# Patient Record
Sex: Female | Born: 1935
Health system: Southern US, Community
[De-identification: ages and names within clinical notes are randomized; demographics above are authoritative.]

## PROBLEM LIST (undated history)

## (undated) ENCOUNTER — Emergency Department (HOSPITAL_COMMUNITY): Payer: Medicare Other | Source: Home / Self Care

## (undated) DIAGNOSIS — F329 Major depressive disorder, single episode, unspecified: Secondary | ICD-10-CM

## (undated) DIAGNOSIS — N183 Chronic kidney disease, stage 3 unspecified: Secondary | ICD-10-CM

## (undated) DIAGNOSIS — E119 Type 2 diabetes mellitus without complications: Secondary | ICD-10-CM

## (undated) DIAGNOSIS — K219 Gastro-esophageal reflux disease without esophagitis: Secondary | ICD-10-CM

## (undated) DIAGNOSIS — F32A Depression, unspecified: Secondary | ICD-10-CM

## (undated) DIAGNOSIS — M1990 Unspecified osteoarthritis, unspecified site: Secondary | ICD-10-CM

## (undated) DIAGNOSIS — I639 Cerebral infarction, unspecified: Secondary | ICD-10-CM

## (undated) DIAGNOSIS — I509 Heart failure, unspecified: Secondary | ICD-10-CM

## (undated) DIAGNOSIS — C4359 Malignant melanoma of other part of trunk: Secondary | ICD-10-CM

## (undated) DIAGNOSIS — I4891 Unspecified atrial fibrillation: Secondary | ICD-10-CM

## (undated) DIAGNOSIS — I10 Essential (primary) hypertension: Secondary | ICD-10-CM

## (undated) DIAGNOSIS — E7800 Pure hypercholesterolemia, unspecified: Secondary | ICD-10-CM

## (undated) DIAGNOSIS — I219 Acute myocardial infarction, unspecified: Secondary | ICD-10-CM

## (undated) HISTORY — PX: KNEE SURGERY: SHX244

## (undated) HISTORY — PX: MELANOMA EXCISION: SHX5266

## (undated) HISTORY — PX: TUBAL LIGATION: SHX77

## (undated) HISTORY — PX: LAPAROSCOPIC CHOLECYSTECTOMY: SUR755

## (undated) HISTORY — PX: APPENDECTOMY: SHX54

---

## 2008-08-05 DIAGNOSIS — I10 Essential (primary) hypertension: Secondary | ICD-10-CM | POA: Diagnosis present

## 2015-04-15 DIAGNOSIS — G459 Transient cerebral ischemic attack, unspecified: Secondary | ICD-10-CM | POA: Insufficient documentation

## 2015-11-13 DIAGNOSIS — Z9181 History of falling: Secondary | ICD-10-CM | POA: Insufficient documentation

## 2015-11-13 DIAGNOSIS — K219 Gastro-esophageal reflux disease without esophagitis: Secondary | ICD-10-CM | POA: Insufficient documentation

## 2016-02-19 DIAGNOSIS — E1129 Type 2 diabetes mellitus with other diabetic kidney complication: Secondary | ICD-10-CM | POA: Insufficient documentation

## 2016-02-19 DIAGNOSIS — N2581 Secondary hyperparathyroidism of renal origin: Secondary | ICD-10-CM | POA: Diagnosis present

## 2016-07-13 DIAGNOSIS — E113493 Type 2 diabetes mellitus with severe nonproliferative diabetic retinopathy without macular edema, bilateral: Secondary | ICD-10-CM | POA: Insufficient documentation

## 2016-07-28 DIAGNOSIS — R011 Cardiac murmur, unspecified: Secondary | ICD-10-CM | POA: Insufficient documentation

## 2016-10-25 DIAGNOSIS — R053 Chronic cough: Secondary | ICD-10-CM | POA: Insufficient documentation

## 2017-02-28 ENCOUNTER — Encounter (HOSPITAL_COMMUNITY): Payer: Self-pay

## 2017-02-28 ENCOUNTER — Inpatient Hospital Stay (HOSPITAL_COMMUNITY)
Admission: EM | Admit: 2017-02-28 | Discharge: 2017-03-07 | DRG: 286 | Disposition: A | Discharge status: Home / Self Care | Payer: Medicare Other | Attending: Family Medicine | Admitting: Family Medicine

## 2017-02-28 ENCOUNTER — Emergency Department (HOSPITAL_COMMUNITY): Payer: Medicare Other

## 2017-02-28 DIAGNOSIS — J45909 Unspecified asthma, uncomplicated: Secondary | ICD-10-CM | POA: Diagnosis present

## 2017-02-28 DIAGNOSIS — N179 Acute kidney failure, unspecified: Secondary | ICD-10-CM

## 2017-02-28 DIAGNOSIS — D539 Nutritional anemia, unspecified: Secondary | ICD-10-CM

## 2017-02-28 DIAGNOSIS — I252 Old myocardial infarction: Secondary | ICD-10-CM

## 2017-02-28 DIAGNOSIS — E1122 Type 2 diabetes mellitus with diabetic chronic kidney disease: Secondary | ICD-10-CM | POA: Diagnosis present

## 2017-02-28 DIAGNOSIS — R0602 Shortness of breath: Secondary | ICD-10-CM | POA: Diagnosis not present

## 2017-02-28 DIAGNOSIS — I509 Heart failure, unspecified: Secondary | ICD-10-CM

## 2017-02-28 DIAGNOSIS — E785 Hyperlipidemia, unspecified: Secondary | ICD-10-CM | POA: Diagnosis present

## 2017-02-28 DIAGNOSIS — I5021 Acute systolic (congestive) heart failure: Secondary | ICD-10-CM

## 2017-02-28 DIAGNOSIS — Z8673 Personal history of transient ischemic attack (TIA), and cerebral infarction without residual deficits: Secondary | ICD-10-CM

## 2017-02-28 DIAGNOSIS — I130 Hypertensive heart and chronic kidney disease with heart failure and stage 1 through stage 4 chronic kidney disease, or unspecified chronic kidney disease: Principal | ICD-10-CM | POA: Diagnosis present

## 2017-02-28 DIAGNOSIS — I10 Essential (primary) hypertension: Secondary | ICD-10-CM

## 2017-02-28 DIAGNOSIS — Z820 Family history of epilepsy and other diseases of the nervous system: Secondary | ICD-10-CM

## 2017-02-28 DIAGNOSIS — I4581 Long QT syndrome: Secondary | ICD-10-CM | POA: Diagnosis present

## 2017-02-28 DIAGNOSIS — D509 Iron deficiency anemia, unspecified: Secondary | ICD-10-CM | POA: Diagnosis present

## 2017-02-28 DIAGNOSIS — D649 Anemia, unspecified: Secondary | ICD-10-CM

## 2017-02-28 DIAGNOSIS — Z809 Family history of malignant neoplasm, unspecified: Secondary | ICD-10-CM

## 2017-02-28 DIAGNOSIS — E7800 Pure hypercholesterolemia, unspecified: Secondary | ICD-10-CM

## 2017-02-28 DIAGNOSIS — N183 Chronic kidney disease, stage 3 unspecified: Secondary | ICD-10-CM

## 2017-02-28 DIAGNOSIS — W19XXXA Unspecified fall, initial encounter: Secondary | ICD-10-CM

## 2017-02-28 DIAGNOSIS — Z823 Family history of stroke: Secondary | ICD-10-CM

## 2017-02-28 DIAGNOSIS — R0902 Hypoxemia: Secondary | ICD-10-CM | POA: Diagnosis present

## 2017-02-28 DIAGNOSIS — Z833 Family history of diabetes mellitus: Secondary | ICD-10-CM

## 2017-02-28 DIAGNOSIS — F419 Anxiety disorder, unspecified: Secondary | ICD-10-CM | POA: Diagnosis present

## 2017-02-28 DIAGNOSIS — I480 Paroxysmal atrial fibrillation: Secondary | ICD-10-CM

## 2017-02-28 DIAGNOSIS — E119 Type 2 diabetes mellitus without complications: Secondary | ICD-10-CM

## 2017-02-28 DIAGNOSIS — I5023 Acute on chronic systolic (congestive) heart failure: Secondary | ICD-10-CM | POA: Diagnosis present

## 2017-02-28 DIAGNOSIS — R001 Bradycardia, unspecified: Secondary | ICD-10-CM | POA: Diagnosis present

## 2017-02-28 DIAGNOSIS — Z87891 Personal history of nicotine dependence: Secondary | ICD-10-CM

## 2017-02-28 DIAGNOSIS — Z7901 Long term (current) use of anticoagulants: Secondary | ICD-10-CM

## 2017-02-28 DIAGNOSIS — Z794 Long term (current) use of insulin: Secondary | ICD-10-CM

## 2017-02-28 DIAGNOSIS — I440 Atrioventricular block, first degree: Secondary | ICD-10-CM | POA: Diagnosis present

## 2017-02-28 HISTORY — DX: Chronic kidney disease, stage 3 unspecified: N18.30

## 2017-02-28 HISTORY — DX: Chronic kidney disease, stage 3 (moderate): N18.3

## 2017-02-28 HISTORY — DX: Malignant melanoma of other part of trunk: C43.59

## 2017-02-28 HISTORY — DX: Acute myocardial infarction, unspecified: I21.9

## 2017-02-28 HISTORY — DX: Gastro-esophageal reflux disease without esophagitis: K21.9

## 2017-02-28 HISTORY — DX: Heart failure, unspecified: I50.9

## 2017-02-28 HISTORY — DX: Major depressive disorder, single episode, unspecified: F32.9

## 2017-02-28 HISTORY — DX: Cerebral infarction, unspecified: I63.9

## 2017-02-28 HISTORY — DX: Unspecified atrial fibrillation: I48.91

## 2017-02-28 HISTORY — DX: Depression, unspecified: F32.A

## 2017-02-28 HISTORY — DX: Pure hypercholesterolemia, unspecified: E78.00

## 2017-02-28 HISTORY — DX: Essential (primary) hypertension: I10

## 2017-02-28 HISTORY — DX: Unspecified osteoarthritis, unspecified site: M19.90

## 2017-02-28 HISTORY — DX: Type 2 diabetes mellitus without complications: E11.9

## 2017-02-28 LAB — FOLATE: FOLATE: 16 ng/mL (ref >5.9)

## 2017-02-28 LAB — BASIC METABOLIC PANEL
ANION GAP: 10 (ref 5–15)
BUN: 22 mg/dL — AB (ref 6–20)
CALCIUM: 9.2 mg/dL (ref 8.9–10.3)
CO2: 22 mmol/L (ref 22–32)
Chloride: 108 mmol/L (ref 101–111)
Creatinine, Ser: 1.57 mg/dL — ABNORMAL HIGH (ref 0.44–1.00)
GFR calc Af Amer: 34 mL/min — ABNORMAL LOW (ref >60)
GFR calc non Af Amer: 29 mL/min — ABNORMAL LOW (ref >60)
GLUCOSE: 133 mg/dL — AB (ref 65–99)
Potassium: 3.7 mmol/L (ref 3.5–5.1)
Sodium: 140 mmol/L (ref 135–145)

## 2017-02-28 LAB — CBC
HCT: 31.8 % — ABNORMAL LOW (ref 36.0–46.0)
HEMOGLOBIN: 9.8 g/dL — AB (ref 12.0–15.0)
MCH: 26.6 pg (ref 26.0–34.0)
MCHC: 30.8 g/dL (ref 30.0–36.0)
MCV: 86.4 fL (ref 78.0–100.0)
Platelets: 211 10*3/uL (ref 150–400)
RBC: 3.68 MIL/uL — ABNORMAL LOW (ref 3.87–5.11)
RDW: 15.7 % — AB (ref 11.5–15.5)
WBC: 7.9 10*3/uL (ref 4.0–10.5)

## 2017-02-28 LAB — RETICULOCYTES
RBC.: 3.73 MIL/uL — AB (ref 3.87–5.11)
Retic Count, Absolute: 63.4 10*3/uL (ref 19.0–186.0)
Retic Ct Pct: 1.7 % (ref 0.4–3.1)

## 2017-02-28 LAB — TROPONIN I: Troponin I: 0.05 ng/mL (ref <0.03)

## 2017-02-28 LAB — GLUCOSE, CAPILLARY: Glucose-Capillary: 154 mg/dL — ABNORMAL HIGH (ref 65–99)

## 2017-02-28 LAB — URINALYSIS, ROUTINE W REFLEX MICROSCOPIC
Bilirubin Urine: NEGATIVE
GLUCOSE, UA: NEGATIVE mg/dL
Hgb urine dipstick: NEGATIVE
Ketones, ur: NEGATIVE mg/dL
LEUKOCYTES UA: NEGATIVE
NITRITE: NEGATIVE
PH: 5 (ref 5.0–8.0)
Protein, ur: NEGATIVE mg/dL
SPECIFIC GRAVITY, URINE: 1.005 (ref 1.005–1.030)

## 2017-02-28 LAB — IRON AND TIBC
Iron: 19 ug/dL — ABNORMAL LOW (ref 28–170)
SATURATION RATIOS: 5 % — AB (ref 10.4–31.8)
TIBC: 381 ug/dL (ref 250–450)
UIBC: 362 ug/dL

## 2017-02-28 LAB — TSH: TSH: 3.309 u[IU]/mL (ref 0.350–4.500)

## 2017-02-28 LAB — CBG MONITORING, ED: Glucose-Capillary: 127 mg/dL — ABNORMAL HIGH (ref 65–99)

## 2017-02-28 LAB — I-STAT TROPONIN, ED: Troponin i, poc: 0.05 ng/mL (ref 0.00–0.08)

## 2017-02-28 LAB — VITAMIN B12: Vitamin B-12: 528 pg/mL (ref 180–914)

## 2017-02-28 LAB — FERRITIN: Ferritin: 34 ng/mL (ref 11–307)

## 2017-02-28 LAB — BRAIN NATRIURETIC PEPTIDE: B Natriuretic Peptide: 1488.1 pg/mL — ABNORMAL HIGH (ref 0.0–100.0)

## 2017-02-28 MED ORDER — DILTIAZEM HCL ER COATED BEADS 180 MG PO CP24
180.0000 mg | ORAL_CAPSULE | Freq: Every day | ORAL | Status: DC
  Administered 2017-02-28 – 2017-03-02 (×3): 180 mg via ORAL
  Filled 2017-02-28 (×3): qty 1

## 2017-02-28 MED ORDER — ONDANSETRON HCL 4 MG/2ML IJ SOLN: 4.0000 mg | Freq: Four times a day (QID) | INTRAMUSCULAR | Status: DC | PRN

## 2017-02-28 MED ORDER — AMIODARONE HCL 100 MG PO TABS
100.0000 mg | ORAL_TABLET | Freq: Every day | ORAL | Status: DC
  Administered 2017-02-28 – 2017-03-07 (×8): 100 mg via ORAL
  Filled 2017-02-28 (×8): qty 1

## 2017-02-28 MED ORDER — ISOSORBIDE MONONITRATE ER 30 MG PO TB24
30.0000 mg | ORAL_TABLET | Freq: Every day | ORAL | Status: DC
  Administered 2017-02-28 – 2017-03-02 (×3): 30 mg via ORAL
  Filled 2017-02-28 (×3): qty 1

## 2017-02-28 MED ORDER — SODIUM CHLORIDE 0.9% FLUSH
3.0000 mL | Freq: Two times a day (BID) | INTRAVENOUS | Status: DC
  Administered 2017-02-28 – 2017-03-07 (×11): 3 mL via INTRAVENOUS

## 2017-02-28 MED ORDER — VENLAFAXINE HCL 75 MG PO TABS
75.0000 mg | ORAL_TABLET | Freq: Every day | ORAL | Status: DC
  Filled 2017-02-28: qty 1

## 2017-02-28 MED ORDER — ALBUTEROL SULFATE (2.5 MG/3ML) 0.083% IN NEBU: 3.0000 mL | INHALATION_SOLUTION | Freq: Four times a day (QID) | RESPIRATORY_TRACT | Status: DC | PRN

## 2017-02-28 MED ORDER — ACETAMINOPHEN 325 MG PO TABS
650.0000 mg | ORAL_TABLET | ORAL | Status: DC | PRN
  Administered 2017-03-04 – 2017-03-05 (×2): 650 mg via ORAL
  Filled 2017-02-28 (×2): qty 2

## 2017-02-28 MED ORDER — FUROSEMIDE 10 MG/ML IJ SOLN
40.0000 mg | Freq: Two times a day (BID) | INTRAMUSCULAR | Status: DC
  Administered 2017-03-01 – 2017-03-02 (×3): 40 mg via INTRAVENOUS
  Filled 2017-02-28 (×3): qty 4

## 2017-02-28 MED ORDER — INSULIN ASPART 100 UNIT/ML ~~LOC~~ SOLN
0.0000 [IU] | Freq: Three times a day (TID) | SUBCUTANEOUS | Status: DC
  Administered 2017-03-01 – 2017-03-02 (×3): 2 [IU] via SUBCUTANEOUS
  Administered 2017-03-02 – 2017-03-03 (×2): 5 [IU] via SUBCUTANEOUS
  Administered 2017-03-03 (×2): 2 [IU] via SUBCUTANEOUS
  Administered 2017-03-04 (×3): 3 [IU] via SUBCUTANEOUS
  Administered 2017-03-05: 5 [IU] via SUBCUTANEOUS
  Administered 2017-03-05: 2 [IU] via SUBCUTANEOUS
  Administered 2017-03-05 – 2017-03-06 (×2): 3 [IU] via SUBCUTANEOUS
  Administered 2017-03-06: 5 [IU] via SUBCUTANEOUS
  Administered 2017-03-07 (×2): 3 [IU] via SUBCUTANEOUS

## 2017-02-28 MED ORDER — FUROSEMIDE 10 MG/ML IJ SOLN
40.0000 mg | Freq: Once | INTRAMUSCULAR | Status: AC
  Administered 2017-02-28: 40 mg via INTRAVENOUS
  Filled 2017-02-28: qty 4

## 2017-02-28 MED ORDER — PRAVASTATIN SODIUM 40 MG PO TABS
40.0000 mg | ORAL_TABLET | Freq: Every day | ORAL | Status: DC
  Administered 2017-02-28 – 2017-03-06 (×7): 40 mg via ORAL
  Filled 2017-02-28 (×7): qty 1

## 2017-02-28 MED ORDER — SODIUM CHLORIDE 0.9 % IV SOLN: 250.0000 mL | INTRAVENOUS | Status: DC | PRN

## 2017-02-28 MED ORDER — RIVAROXABAN 15 MG PO TABS
15.0000 mg | ORAL_TABLET | Freq: Every day | ORAL | Status: DC
  Administered 2017-02-28 – 2017-03-01 (×2): 15 mg via ORAL
  Filled 2017-02-28 (×2): qty 1

## 2017-02-28 MED ORDER — INSULIN GLARGINE 100 UNIT/ML ~~LOC~~ SOLN
30.0000 [IU] | Freq: Every day | SUBCUTANEOUS | Status: DC
  Administered 2017-02-28 – 2017-03-06 (×7): 30 [IU] via SUBCUTANEOUS
  Filled 2017-02-28 (×8): qty 0.3

## 2017-02-28 MED ORDER — LOSARTAN POTASSIUM 50 MG PO TABS
100.0000 mg | ORAL_TABLET | Freq: Every day | ORAL | Status: DC
  Administered 2017-02-28 – 2017-03-02 (×3): 100 mg via ORAL
  Filled 2017-02-28 (×3): qty 2

## 2017-02-28 MED ORDER — SODIUM CHLORIDE 0.9% FLUSH: 3.0000 mL | INTRAVENOUS | Status: DC | PRN

## 2017-02-28 NOTE — ED Triage Notes (Addendum)
Per Pt, Pt is coming from UC with complaints of SOB and Bilateral leg swelling x 1 week. Pt has a hx of asthma and was trying to use inhalers with no relief. Pt reports a productive cough with some white sputum. Denies any chest pain except with exertion. Pt was diagnosed with UTI at the UC.

## 2017-02-28 NOTE — ED Provider Notes (Signed)
[facility] DEPT Provider Note   CSN: [id number] Arrival date & time: 02/28/17  1610     History   Chief Complaint Chief Complaint  Patient presents with  . Shortness of Breath    HPI Shannon Lucas is a 83 y.o. female history of A. fib on some route toe, hypertension, who presenting shortness of breath. Patient states that she has been coughing all winter long and was thought to have some bronchitis. She is using albuterol as needed and has not helped much. Over the last week, her cough got worse and she feels very short of breath. She noticed that her legs are swollen bilaterally and she gets shortness of breath with minimal exertion. Patient also noticed that she has to be sleeping on more pillows at night. Denies any fevers or chills. She has been followed at [facility] and has no doctors in our system. She denies any history of heart failure in the past.  The history is provided by the patient.    Past Medical History:  Diagnosis Date  . A-fib ([place])   . Arthritis   . Cancer (HCC)    Skin  . CHF (congestive heart failure) ([place])   . Diabetes mellitus without complication ([place])   . Hypertension   . Renal disorder    Stage 4 Kidney Failure  . Stroke [facility])     There are no active problems to display for this patient.   Past Surgical History:  Procedure Laterality Date  . KNEE SURGERY      OB History    No data available       Home Medications    Prior to Admission medications   Not on File    Family History No family history on file.  Social History Social History  Substance Use Topics  . Smoking status: Former Research scientist (life sciences)  . Smokeless tobacco: Never Used  . Alcohol use No     Allergies   Patient has no known allergies.   Review of Systems Review of Systems  Respiratory: Positive for shortness of breath.   All other systems reviewed and are negative.    Physical Exam Updated Vital Signs BP (!) 141/76   Pulse 96   Temp 97.4 F (36.3 C)  (Oral)   Resp 20   Ht 5' 5.5" (1.664 m)   SpO2 100%   Physical Exam  Constitutional: She is oriented to person, place, and time.  Uncomfortable   HENT:  Head: Normocephalic.  Mouth/Throat: Oropharynx is clear and moist.  Eyes: Conjunctivae and EOM are normal. Pupils are equal, round, and reactive to light.  Neck: Normal range of motion. Neck supple.  Cardiovascular: Normal rate, regular rhythm and normal heart sounds.   Pulmonary/Chest: Effort normal.  Crackles bilateral bases   Abdominal: Soft. Bowel sounds are normal. She exhibits no distension. There is no tenderness.  Musculoskeletal: Normal range of motion.  1+ edema bilateral legs   Neurological: She is alert and oriented to person, place, and time.  Skin: Skin is warm.  Psychiatric: She has a normal mood and affect.  Nursing note and vitals reviewed.    ED Treatments / Results  Labs (all labs ordered are listed, but only abnormal results are displayed) Labs Reviewed  BASIC METABOLIC PANEL - Abnormal; Notable for the following:       Result Value   Glucose, Bld 133 (*)    BUN 22 (*)    Creatinine, Ser 1.57 (*)    GFR calc non Af  Amer 29 (*)    GFR calc Af Amer 34 (*)    All other components within normal limits  CBC - Abnormal; Notable for the following:    RBC 3.68 (*)    Hemoglobin 9.8 (*)    HCT 31.8 (*)    RDW 15.7 (*)    All other components within normal limits  BRAIN NATRIURETIC PEPTIDE - Abnormal; Notable for the following:    B Natriuretic Peptide 1,488.1 (*)    All other components within normal limits  URINALYSIS, ROUTINE W REFLEX MICROSCOPIC  I-STAT TROPOININ, ED    EKG  EKG Interpretation  Date/Time:  Tuesday February 28 2017 16:21:09 EDT Ventricular Rate:  53 PR Interval:  220 QRS Duration: 92 QT Interval:  532 QTC Calculation: 499 R Axis:   36 Text Interpretation:  Sinus bradycardia with 1st degree A-V block Abnormal QRS-T angle, consider primary T wave abnormality Prolonged QT Abnormal  ECG No previous ECGs available Confirmed by YAO  MD, DAVID ([id number]) on 02/28/2017 4:56:29 PM       Radiology Dg Chest 2 View  Result Date: 02/28/2017 CLINICAL DATA:  Short of breath EXAM: CHEST  2 VIEW COMPARISON:  None. FINDINGS: Cardiac enlargement. Pulmonary artery enlargement consistent with pulmonary artery hypertension. Negative for heart failure, edema, or effusion. Mild atelectasis or scarring in the bases. Negative for pneumonia. Apical pleural scarring bilaterally. IMPRESSION: Cardiac enlargement with pulmonary artery enlargement suggesting pulmonary artery hypertension. No acute abnormality. Electronically Signed   By: Franchot Gallo M.D.   On: 02/28/2017 16:53    Procedures Procedures (including critical care time)  Medications Ordered in ED Medications  furosemide (LASIX) injection 40 mg (40 mg Intravenous Given 02/28/17 1807)     Initial Impression / Assessment and Plan / ED Course  I have reviewed the triage vital signs and the nursing notes.  Pertinent labs & imaging results that were available during my care of the patient were reviewed by me and considered in my medical decision making (see chart for details).     Shannon Lucas is a 83 y.o. female here with worsening SOB with exertion and laying down. I think likely new onset CHF. Patient desat to 84% with ambulation. BNP 1400. CXR showed cardiomegaly. Given lasix 40 mg IV. Family practice to admit for CHF exacerbation.    Final Clinical Impressions(s) / ED Diagnoses   Final diagnoses:  None    New Prescriptions New Prescriptions   No medications on file     Drenda Freeze, MD 02/28/17 [phone]

## 2017-02-28 NOTE — ED Notes (Signed)
Walked pt with pulse oximetry. Upon standing and exiting pt's room, pt's O2 level dropped to 84.  Pt became dizzy, lightheaded, and felt unsteady.  Brought pt back to room.  Current O2 level 94

## 2017-02-28 NOTE — H&P (Signed)
[facility] Admission History and Physical Service Pager: [phone]  Patient name: Shannon Lucas Medical record number: [id number] Date of birth: 16-Jun-1931 Age: 83 y.o. Gender: female  Primary Care Provider: Manfred Shirts, PA Consultants: None Code Status: Full   Chief Complaint: dyspnea, increasing lower extremity edema, and cough 1 week  Assessment and Plan: [name] is a 83 y.o. female presenting with dyspnea, cough, increasing edema 1 week . PMH is significant for paroxysmal atrial fibrillation on Xarelto, CKD stage 3, type 2 diabetes, GERD, hyperlipidemia, stroke, ? MI, anemia, CHF  CHF exacerbation: Prominent symptoms include dyspnea, increasing lower extremity edema, and cough 1 week. Unclear if patient has systolic or diastolic failure, no previous echoes to review. Cannot find last echo in care everywhere. Vitals stable with blood pressure 141/76. Fluctuating pulse from the 50s to the 80s. No tachypnea, 100% oxygen on room air. Physical exam showing crackles in bilateral lower lung fields as well as 2+ pitting edema from bilateral ankles to knees and 1+ pitting edema from knees to mid thighs. Notable labs include BNP of 1488, troponin 0, creatinine 1.57. Chest x-ray showing nothing acute but likely pulmonary arterial enlargement indicative of pulmonary arterial hypertension. EKG showing sinus bradycardia with first-degree AV block, prolonged QTC of 499. Status post 40 mg IV Lasix in ED. -Admit to family medicine teaching service, telemetry floor, attending Dr. Ree Kida -Vital signs per floor protocol -Continuous cardiac monitoring -Continuous pulse ox -Trend troponins -Echo -40 mg Lasix IV twice a day -Strict I's and O's -Daily weights -AM EKG, BMP, CBC -TSH, lipid panel, hemoglobin A1c -Consider cardiac consultation if either troponins or echo indicate significant abnormalities  -Resume home amiodarone 100 mg daily, diltiazem 100 mg daily, and Imdur  30 mg daily, Cozaar 100 mg daily   Paroxysmal atrial fibrillation: On Xarelto chronically. Currently in sinus rhythm. -Continue home diltiazem and amiodarone as above -Continue home Xarelto  First-degree AV block: Seen on admission EKG. Do not see an patient's past medical history care everywhere -Continuous cardiac monitoring  Chronic kidney disease stage III Creatinine 1.57 on admission. Baseline creatinine appears to be between 1.49 and 1.6 on care everywhere. -Try to avoid nephrotoxic agents  Type 2 diabetes: Home medications include Lantus 70 units daily at bedtime, although patient states she is only taking 57 units. Per care everywhere last hemoglobin A1c was 7.2 in February 2018. -We'll decrease Lantus to 30 units daily at bedtime for now -CBGs with meals and at bedtime -Moderate sliding scale insulin -Hemoglobin A1c  Hypertension: Normotensive on admission -Vital signs per floor protocol -Continue Cozaar as above  Hyperlipidemia: Per care everywhere last LDL was 70 in February 2018. -continue home pravastatin 40 mg daily  Anxiety -Continue home venlafaxine  History of asthma -Continue home albuterol inhaler as needed  Anemia: Hemoglobin 9.8 on admission, normocytic with an MCV of 86.4. Patient has been endorsing some fatigue.  -Anemia panel -FOBT  FEN/GI: Saline lock IV, carb modified/heart healthy diet Prophylaxis: Xarelto  Disposition: Admit to family medicine teaching service, telemetry, attending Dr. Ree Kida  History of Present Illness:  Shannon Lucas is a 83 y.o. female presenting with shortness of breath, cough, and worsening lower extremity edema x 1 week.   Patient notes that for the last week she has had worsening productive cough with white sputum, shortness of breath, and lower extremity edema. She notes that all of the symptoms are actually chronic for her and have been going on for "many months "but they have  just worsened over the last month. Patient  admits to asthma and uses albuterol inhalers at home but they have not provided relief for her symptoms. She notes that she feels more fatigued and sometimes off balance. At baseline she is able to ambulate without difficulty and take care of her great grandchildren however over the last week she has just been too tired to do so.   She does endorse some intermittent centralized chest pain that has been going on for the last month or so. Chest pain occurs when she is sitting down and not with movement. Has not tried any medication for this pain. She does not know if this feels like acid reflux. She notes that she follows with her doctors at [facility]. She thinks that she has been told before that she has heart failure but is not sure. She also thinks that she had a recent echocardiogram at [facility] but cannot say for sure.   Of note patient went to an urgent care today, cannot remember the name of it, but notes that she was told that she had "fluid on her heart "after receiving a chest x-ray and she was told that she had a urinary tract infection. Admits to polyuria and increased urinary frequency but denies any dysuria or hematuria. She was not given any antibiotics that she was told to come to the emergency department.  In the emergency department she was given 40 mg IV Lasix. She did have a brief episode of hypoxia to 84% with ambulation but at rest her oxygen was in the 90s. Did not require any supplemental oxygen.  Review Of Systems: Per HPI with the following additions: See history of present illness  Review of Systems    Patient Active Problem List   Diagnosis Date Noted  . Shortness of breath 02/28/2017    Past Medical History: Past Medical History:  Diagnosis Date  . A-fib ([place])   . Arthritis   . Cancer (HCC)    Skin  . CHF (congestive heart failure) ([place])   . Diabetes mellitus without complication ([place])   . Hypertension   . Renal disorder    Stage 4 Kidney Failure  . Stroke  [facility])     Past Surgical History: Past Surgical History:  Procedure Laterality Date  . KNEE SURGERY      Social History: Social History  Substance Use Topics  . Smoking status: Former Research scientist (life sciences)  . Smokeless tobacco: Never Used  . Alcohol use No   Please also refer to relevant sections of EMR.  Family History: . Diabetes Mother  . Hip fracture Mother  she died due to pneumonia at the age of 14 shortly after having a hip fracture  . Cancer Father  . Stroke Father  this was his COD around the age of 67  . Parkinsonism Brother  one of her three brothers has this   Allergies and Medications: No Known Allergies No current facility-administered medications on file prior to encounter.    No current outpatient prescriptions on file prior to encounter.    Objective: BP 132/86   Pulse 82   Temp 97.4 F (36.3 C) (Oral)   Resp 16   Ht 5' 5.5" (1.664 m)   SpO2 96%  Exam: General: Elderly female in no acute distress, sitting up in bed Eyes: Pupils equal and reactive to light, nonicteric sclera ENTM: Moist mucous membranes, dentures in maxillary jaw Neck: No lymphadenopathy, supple Cardiovascular: Bradycardic rate, regular rhythm, no murmurs, 2+ pitting  edema from ankles to knees bilaterally, 1+ pitting edema from knees to mid thigh bilaterally Respiratory: Normal work of breathing, crackles heard in bilateral lower lung fields, no wheezes Gastrointestinal: Soft, nondistended, nontender, normal bowel sounds MSK: Moves all extremities spontaneously, full range of motion of all joints Derm: 2 inch area of ecchymosis on right upper quadrant Neuro: Alert and oriented 3, sensation intact throughout, no slurred speech or facial drooping, 5 out of 5 strength in bilateral upper and lower extremities Psych: Normal mood and affect  Labs and Imaging: CBC BMET   Recent Labs Lab 02/28/17 1629  WBC 7.9  HGB 9.8*  HCT 31.8*  PLT 211    Recent Labs Lab 02/28/17 1629  NA 140  K 3.7   CL 108  CO2 22  BUN 22*  CREATININE 1.57*  GLUCOSE 133*  CALCIUM 9.2     Troponin 0  BNP    Component Value Date/Time   BNP 1,488.1 (H) 02/28/2017 1629   Urinalysis    Component Value Date/Time   COLORURINE STRAW (A) 02/28/2017 2018   APPEARANCEUR CLEAR 02/28/2017 2018   LABSPEC 1.005 02/28/2017 2018   [place] 5.0 02/28/2017 2018   [place] 02/28/2017 2018   HGBUR NEGATIVE 02/28/2017 2018   [place] NEGATIVE 02/28/2017 2018   [place] NEGATIVE 02/28/2017 2018   PROTEINUR NEGATIVE 02/28/2017 2018   NITRITE NEGATIVE 02/28/2017 2018   LEUKOCYTESUR NEGATIVE 02/28/2017 2018    EKG: Sinus bradycardia with first-degree AV block  Dg Chest 2 View  Result Date: 02/28/2017 CLINICAL DATA:  Short of breath EXAM: CHEST  2 VIEW COMPARISON:  None. FINDINGS: Cardiac enlargement. Pulmonary artery enlargement consistent with pulmonary artery hypertension. Negative for heart failure, edema, or effusion. Mild atelectasis or scarring in the bases. Negative for pneumonia. Apical pleural scarring bilaterally. IMPRESSION: Cardiac enlargement with pulmonary artery enlargement suggesting pulmonary artery hypertension. No acute abnormality. Electronically Signed   By: Franchot Gallo M.D.   On: 02/28/2017 16:53    Carlyle Dolly, MD 02/28/2017, 8:58 PM PGY-2, [place] Intern pager: [phone], text pages welcome

## 2017-03-01 ENCOUNTER — Inpatient Hospital Stay (HOSPITAL_BASED_OUTPATIENT_CLINIC_OR_DEPARTMENT_OTHER): Payer: Medicare Other

## 2017-03-01 DIAGNOSIS — R0602 Shortness of breath: Secondary | ICD-10-CM | POA: Diagnosis not present

## 2017-03-01 DIAGNOSIS — I480 Paroxysmal atrial fibrillation: Secondary | ICD-10-CM | POA: Diagnosis not present

## 2017-03-01 DIAGNOSIS — I10 Essential (primary) hypertension: Secondary | ICD-10-CM | POA: Diagnosis not present

## 2017-03-01 DIAGNOSIS — Z794 Long term (current) use of insulin: Secondary | ICD-10-CM

## 2017-03-01 DIAGNOSIS — W19XXXA Unspecified fall, initial encounter: Secondary | ICD-10-CM

## 2017-03-01 DIAGNOSIS — E1122 Type 2 diabetes mellitus with diabetic chronic kidney disease: Secondary | ICD-10-CM | POA: Diagnosis not present

## 2017-03-01 DIAGNOSIS — N183 Chronic kidney disease, stage 3 unspecified: Secondary | ICD-10-CM

## 2017-03-01 DIAGNOSIS — E7800 Pure hypercholesterolemia, unspecified: Secondary | ICD-10-CM | POA: Diagnosis not present

## 2017-03-01 DIAGNOSIS — R0600 Dyspnea, unspecified: Secondary | ICD-10-CM | POA: Diagnosis not present

## 2017-03-01 DIAGNOSIS — D649 Anemia, unspecified: Secondary | ICD-10-CM | POA: Diagnosis not present

## 2017-03-01 DIAGNOSIS — Z7901 Long term (current) use of anticoagulants: Secondary | ICD-10-CM

## 2017-03-01 DIAGNOSIS — D539 Nutritional anemia, unspecified: Secondary | ICD-10-CM

## 2017-03-01 DIAGNOSIS — I509 Heart failure, unspecified: Secondary | ICD-10-CM

## 2017-03-01 DIAGNOSIS — E119 Type 2 diabetes mellitus without complications: Secondary | ICD-10-CM

## 2017-03-01 LAB — BASIC METABOLIC PANEL WITH GFR
Anion gap: 8 (ref 5–15)
BUN: 25 mg/dL — ABNORMAL HIGH (ref 6–20)
CO2: 25 mmol/L (ref 22–32)
Calcium: 8.9 mg/dL (ref 8.9–10.3)
Chloride: 108 mmol/L (ref 101–111)
Creatinine, Ser: 1.79 mg/dL — ABNORMAL HIGH (ref 0.44–1.00)
GFR calc Af Amer: 29 mL/min — ABNORMAL LOW
GFR calc non Af Amer: 25 mL/min — ABNORMAL LOW
Glucose, Bld: 152 mg/dL — ABNORMAL HIGH (ref 65–99)
Potassium: 3.7 mmol/L (ref 3.5–5.1)
Sodium: 141 mmol/L (ref 135–145)

## 2017-03-01 LAB — GLUCOSE, CAPILLARY
GLUCOSE-CAPILLARY: 177 mg/dL — AB (ref 65–99)
Glucose-Capillary: 108 mg/dL — ABNORMAL HIGH (ref 65–99)
Glucose-Capillary: 139 mg/dL — ABNORMAL HIGH (ref 65–99)
Glucose-Capillary: 135 mg/dL — ABNORMAL HIGH (ref 65–99)

## 2017-03-01 LAB — CBC
HEMATOCRIT: 29.4 % — AB (ref 36.0–46.0)
Hemoglobin: 9.4 g/dL — ABNORMAL LOW (ref 12.0–15.0)
MCH: 27.8 pg (ref 26.0–34.0)
MCHC: 32 g/dL (ref 30.0–36.0)
MCV: 87 fL (ref 78.0–100.0)
Platelets: 198 10*3/uL (ref 150–400)
RBC: 3.38 MIL/uL — AB (ref 3.87–5.11)
RDW: 16.5 % — ABNORMAL HIGH (ref 11.5–15.5)
WBC: 6.7 10*3/uL (ref 4.0–10.5)

## 2017-03-01 LAB — ECHOCARDIOGRAM COMPLETE
HEIGHTINCHES: 65 in
Weight: 3364.8 oz

## 2017-03-01 LAB — HEMOGLOBIN A1C
Hgb A1c MFr Bld: 6.9 % — ABNORMAL HIGH (ref 4.8–5.6)
Mean Plasma Glucose: 151 mg/dL

## 2017-03-01 LAB — TROPONIN I
Troponin I: 0.05 ng/mL
Troponin I: 0.04 ng/mL

## 2017-03-01 MED ORDER — FERROUS SULFATE 325 (65 FE) MG PO TABS
325.0000 mg | ORAL_TABLET | Freq: Three times a day (TID) | ORAL | Status: DC
  Administered 2017-03-01 – 2017-03-07 (×19): 325 mg via ORAL
  Filled 2017-03-01 (×19): qty 1

## 2017-03-01 MED ORDER — BENZONATATE 100 MG PO CAPS
100.0000 mg | ORAL_CAPSULE | Freq: Two times a day (BID) | ORAL | Status: DC | PRN
  Administered 2017-03-01: 100 mg via ORAL
  Filled 2017-03-01: qty 1

## 2017-03-01 MED ORDER — POLYETHYLENE GLYCOL 3350 17 G PO PACK
17.0000 g | PACK | Freq: Every day | ORAL | Status: DC
  Administered 2017-03-01 – 2017-03-02 (×2): 17 g via ORAL
  Filled 2017-03-01 (×2): qty 1

## 2017-03-01 NOTE — Progress Notes (Signed)
New pt admission from ED. Pt brought to the floor in stable condition. Vitals taken. Initial Assessment done. All immediate pertinent needs to patient addressed. Patient Guide given to patient. Important safety instructions relating to hospitalization reviewed with patient. Patient verbalized understanding.  Fall risk booklet signed by patient Heart Failure booklet provided to the patient and educated just a bit for now since pt admitted night time and wants to have some rest, more education will be provided later. Will continue to monitor pt.

## 2017-03-01 NOTE — Progress Notes (Signed)
Advance Beneficiary Notice of Noncoverage (ABN) given to patient as requested by Shannon Sousa RN UR Reviewer / Dr Burnard Bunting Medical Director; Letter explained to the patient in detail; she refused to sign letter. Mindi Slicker RN,MHA,BSN

## 2017-03-01 NOTE — Care Management CC44 (Signed)
Condition Code 44 Documentation Completed  Patient Details  Name: Saman Giddens MRN: [id number] Date of Birth: 03-16-1931   Condition Code 44 given:  Yes Patient signature on Condition Code 44 notice:  Yes Documentation of 2 MD's agreement:  Yes Code 44 added to claim:  Yes    Royston Bake, RN 03/01/2017, 2:48 PM

## 2017-03-01 NOTE — Evaluation (Signed)
Occupational Therapy Evaluation Patient Details Name: Shannon Lucas MRN: [id number] DOB: 1931-07-15 Today's Date: 03/01/2017    History of Present Illness Pt is an 83 y/o female admitted secondary to dyspnea and bilateral LE edema found to have an acute CHF exacerbation. PMH including but not limited to a-fib, CHF, DM, HTN and CKD.   Clinical Impression   Pt admitted with CHF exacerbation. Pt currently with functional limitations due to the deficits listed below (see OT Problem List).  Pt will benefit from skilled OT to increase their safety and independence with ADL and functional mobility for ADL to facilitate discharge to venue listed below.      Follow Up Recommendations  No OT follow up    Equipment Recommendations  None recommended by OT    Recommendations for Other Services       Precautions / Restrictions Precautions Precautions: Fall Restrictions Weight Bearing Restrictions: No      Mobility Bed Mobility Overal bed mobility: Independent             General bed mobility comments: pt sitting OOB in recliner chair when therapist entered room  Transfers Overall transfer level: Needs assistance Equipment used: None Transfers: Sit to/from Stand;Stand Pivot Transfers Sit to Stand: Supervision Stand pivot transfers: Min guard       General transfer comment: increased time, supervision for safety    Balance Overall balance assessment: Needs assistance Sitting-balance support: Feet supported Sitting balance-Leahy Scale: Good     Standing balance support: During functional activity;No upper extremity supported Standing balance-Leahy Scale: Fair Standing balance comment: pt able to stand at sink to wash hands without UE supports, supervision for safety                           ADL either performed or assessed with clinical judgement   ADL Overall ADL's : Needs assistance/impaired Eating/Feeding: Independent   Grooming: Standing;Brushing  hair;Oral care;Wash/dry face;Min guard Grooming Details (indicate cue type and reason): educated pt on taking break and sitting if needed Upper Body Bathing: Set up;Sitting   Lower Body Bathing: Minimal assistance;Sit to/from stand   Upper Body Dressing : Set up;Sitting   Lower Body Dressing: Minimal assistance;Sit to/from stand;Cueing for safety   Toilet Transfer: Min guard;Ambulation             General ADL Comments: cueing for energy conservation as pt fatigued. Will benefit from further education regarding energy conservation and ADL activity                  Pertinent Vitals/Pain Pain Assessment: No/denies pain     Hand Dominance     Extremity/Trunk Assessment Upper Extremity Assessment Upper Extremity Assessment: Overall WFL for tasks assessed   Lower Extremity Assessment Lower Extremity Assessment: Overall WFL for tasks assessed   Cervical / Trunk Assessment Cervical / Trunk Assessment: Normal   Communication Communication Communication: No difficulties   Cognition Arousal/Alertness: Awake/alert Behavior During Therapy: WFL for tasks assessed/performed Overall Cognitive Status: Within Functional Limits for tasks assessed                                                Home Living Family/patient expects to be discharged to:: Private residence Living Arrangements: Children;Other relatives Available Help at Discharge: Family;Available PRN/intermittently Type of Home: House Home Access: Stairs to enter Entrance  Stairs-Number of Steps: 2   Home Layout: One level     Bathroom Shower/Tub: Chief Strategy Officer: Shower seat - built in;Walker - 4 wheels;Cane - single point          Prior Functioning/Environment Level of Independence: Independent        Comments: pt reported that she ambulates unassisted within her home and usually takes her rollator when out in the community        OT Problem List:  Decreased activity tolerance;Cardiopulmonary status limiting activity;Decreased knowledge of use of DME or AE      OT Treatment/Interventions: Patient/family education;DME and/or AE instruction;Energy conservation;Self-care/ADL training    OT Goals(Current goals can be found in the care plan section) Acute Rehab OT Goals Patient Stated Goal: return home OT Goal Formulation: With patient Time For Goal Achievement: 03/08/17 Potential to Achieve Goals: Good  OT Frequency:     Barriers to D/C:               End of Session Nurse Communication: Mobility status  Activity Tolerance: Patient limited by fatigue Patient left: in bed;with call bell/phone within reach  OT Visit Diagnosis: Muscle weakness (generalized) (M62.81)                Time: 7737-3668 OT Time Calculation (min): 15 min Charges:  OT General Charges $OT Visit: 1 Procedure OT Evaluation $OT Eval Moderate Complexity: 1 Procedure G-Codes:     Kari Baars, OT [phone]  Payton Mccallum D 03/01/2017, 2:39 PM

## 2017-03-01 NOTE — Progress Notes (Signed)
  Echocardiogram 2D Echocardiogram has been performed.  Shannon Lucas 03/01/2017, 12:32 PM

## 2017-03-01 NOTE — Progress Notes (Signed)
Pt slept well overnight, vitals stable, no nay complaints of pain and SOB so far, EKG done this am, will continue to monitor the patient

## 2017-03-01 NOTE — Progress Notes (Signed)
PT Cancellation Note  Patient Details Name: Shannon Lucas MRN: [id number] DOB: 07/07/1931   Cancelled Treatment:    Reason Eval/Treat Not Completed: Patient at procedure or test/unavailable. Pt having an echo in room. PT will continue to f/u with pt as available.   [place] 03/01/2017, 11:57 AM

## 2017-03-01 NOTE — Progress Notes (Signed)
Family Medicine Teaching Service Daily Progress Note Intern Pager: [phone]  Patient name: Shannon Lucas Medical record number: [id number] Date of birth: [birth year] Age: 83 y.o. Gender: female  Primary Care Provider: Manfred Shirts, PA Consultants: None Code Status: Full   Pt Overview and Major Events to Date:  4/17 Admitted for CHF exacerbation  Assessment and Plan: Shannon Lucas is a 83 y.o. female presenting with dyspnea, cough, increasing edema 1 week . PMH is significant for paroxysmal atrial fibrillation on Xarelto, CKD stage 3, type 2 diabetes, GERD, hyperlipidemia, stroke, ? MI, anemia, CHF  CHF exacerbation: Vitals stable overnight. No O2 requirement. Physical exam continues to show crackles in bilateral lower lung fields as well as 2+ pitting edema from bilateral ankles to knees and 1+ pitting edema from knees to mid thighs. Status post 40 mg IV Lasix in ED. Troponin neg x 2. UO 1.7 L. Weight 210 (same as admission). Cr worse today at 1.79. EKG this AM unchanged from yesterday. TSH normal. A1C pending.  -Vital signs per floor protocol -Continuous cardiac monitoring -Continuous pulse ox -Trend last troponin -Echo -40 mg Lasix IV twice a day -Strict I's and O's -Daily weights -Continue home amiodarone 100 mg daily, diltiazem 100 mg daily, and Imdur 30 mg daily, Cozaar 100 mg daily   Labile Pulse: Patient has pulses that range from 55-95. Unclear etiology. Possibly some aspect of tachybrady syndrome? Still in normal sinus rhythm.  - Continuous cardiac monitoring   Paroxysmal atrial fibrillation: On Xarelto chronically. Currently in sinus rhythm. -Continue home diltiazem and amiodarone as above -Continue home Xarelto  First-degree AV block: Seen on admission EKG. Not on this morning's EKD -Continuous cardiac monitoring  Prolonged QTc: QTc of 499 on admission. QTc 536 now.  - Avoid QT prolonging medications  Chronic kidney disease stage III Creatinine 1.57>. Baseline  creatinine appears to be between 1.49 and 1.6 per care everywhere. -Try to avoid nephrotoxic agents  Type 2 diabetes: Home medications include Lantus 70 units daily at bedtime, although patient states she is only taking 57 units. Per care everywhere last hemoglobin A1c was 7.2 in February 2018. Fasting CBG 108 this AM.  -Continue decreased dose of Lantus to 30 units daily at bedtime for now -CBGs with meals and at bedtime -Moderate sliding scale insulin -Hemoglobin A1c pending  Hypertension: Normotensive  -Vital signs per floor protocol -Continue Cozaar as above  Hyperlipidemia: Per care everywhere last LDL was 70 in February 2018. -continue home pravastatin 40 mg daily  Anxiety -Hold home venlafaxine due to prolonged QTc  History of asthma -Continue home albuterol inhaler as needed  Anemia: Hemoglobin 9.8 on admission, normocytic with an MCV of 86.4. Patient has been endorsing some fatigue. Anemia panel showing iron deficiency with iron of 19 and 5% saturation. Normal ferritin.  -FOBT pending - Begin daily iron supplementation - Miralax daily to avoid constipation  ?Dementia: Not on problem list but there were some concerns.   ?Frequent falls: Per patient report this morning. Could possibly be due to worsening CHF. Patient's grand daughter and great grand daughter did not mention this on admission.  - If she is at risk for increased falls, she may not be a great candidate for long term anticoagulation - PT/OT  Hx of hematuria: Normal UA on admission  FEN/GI: Saline lock IV, carb modified/heart healthy diet Prophylaxis: Xarelto  Disposition:   Subjective:  Patient feeling well this morning. No complaints. Denies any shortness of breath at rest. Denies any chest pain. Ate breakfast  this morning.   Objective: Temp:  [97.3 F (36.3 C)-98.2 F (36.8 C)] 98.2 F (36.8 C) (04/18 0755) Pulse Rate:  [48-97] 60 (04/18 0755) Resp:  [15-28] 18 (04/18 0755) BP:  (117-168)/(50-101) 140/65 (04/18 0755) SpO2:  [89 %-100 %] 95 % (04/18 0755) Weight:  [210 lb 3.2 oz (95.3 kg)-210 lb 4.8 oz (95.4 kg)] 210 lb 4.8 oz (95.4 kg) (04/18 0429) Physical Exam: General: Elderly female in no acute distress, sitting up in bed Cardiovascular: Bradycardic rate, regular rhythm, no murmurs, 2+ pitting edema from ankles to knees bilaterally, 1+ pitting edema from knees to mid thigh bilaterally Respiratory: Normal work of breathing, crackles heard in bilateral lower lung fields, no wheezes Gastrointestinal: Soft, nondistended, nontender, normal bowel sounds MSK: Moves all extremities spontaneously, full range of motion of all joints Derm: 2 inch area of ecchymosis on right upper quadrant Neuro: Alert and oriented 3  Laboratory:  Recent Labs Lab 02/28/17 1629  WBC 7.9  HGB 9.8*  HCT 31.8*  PLT 211    Recent Labs Lab 02/28/17 1629 03/01/17 0312  NA 140 141  K 3.7 3.7  CL 108 108  CO2 22 25  BUN 22* 25*  CREATININE 1.57* 1.79*  CALCIUM 9.2 8.9  GLUCOSE 133* 152*    Iron/TIBC/Ferritin/ %Sat    Component Value Date/Time   IRON 19 (L) 02/28/2017 2202   TIBC 381 02/28/2017 2202   FERRITIN 34 02/28/2017 2202   IRONPCTSAT 5 (L) 02/28/2017 2202     Imaging/Diagnostic Tests: Dg Chest 2 View  Result Date: 02/28/2017 CLINICAL DATA:  Short of breath EXAM: CHEST  2 VIEW COMPARISON:  None. FINDINGS: Cardiac enlargement. Pulmonary artery enlargement consistent with pulmonary artery hypertension. Negative for heart failure, edema, or effusion. Mild atelectasis or scarring in the bases. Negative for pneumonia. Apical pleural scarring bilaterally. IMPRESSION: Cardiac enlargement with pulmonary artery enlargement suggesting pulmonary artery hypertension. No acute abnormality. Electronically Signed   By: Franchot Gallo M.D.   On: 02/28/2017 16:53    Carlyle Dolly, MD 03/01/2017, 8:49 AM PGY-2, [place] Intern pager: [phone], text  pages welcome

## 2017-03-01 NOTE — Care Management Obs Status (Signed)
[facility] NOTIFICATION   Patient Details  Name: Shannon Lucas MRN: [id number] Date of Birth: 12/25/1930   Medicare Observation Status Notification Given:  Yes    Royston Bake, RN 03/01/2017, 2:48 PM

## 2017-03-01 NOTE — Evaluation (Signed)
Physical Therapy Evaluation Patient Details Name: Shannon Lucas MRN: [id number] DOB: 06-Mar-1931 Today's Date: 03/01/2017   History of Present Illness  Pt is an 83 y/o female admitted secondary to dyspnea and bilateral LE edema found to have an acute CHF exacerbation. PMH including but not limited to a-fib, CHF, DM, HTN and CKD.  Clinical Impression  Pt presented sitting OOB in recliner chair, awake and willing to participate in therapy session. Prior to admission, pt reported that she ambulates independently within her home, frequently uses a rollator to ambulate within her community and is independent with ADLs. Pt moving well during evaluation with no instability or LOB with ambulation. Pt with no reports of pain, dizziness, dyspnea or fatigue throughout. PT will continue to follow pt acutely to ensure a safe d/c home.     Follow Up Recommendations No PT follow up    Equipment Recommendations  None recommended by PT    Recommendations for Other Services       Precautions / Restrictions Precautions Precautions: Fall Restrictions Weight Bearing Restrictions: No      Mobility  Bed Mobility               General bed mobility comments: pt sitting OOB in recliner chair when therapist entered room  Transfers Overall transfer level: Needs assistance Equipment used: None Transfers: Sit to/from Stand Sit to Stand: Supervision         General transfer comment: increased time, supervision for safety  Ambulation/Gait Ambulation/Gait assistance: Supervision Ambulation Distance (Feet): 40 Feet Assistive device: Rolling walker (2 wheeled) Gait Pattern/deviations: Step-through pattern Gait velocity: WFL Gait velocity interpretation: at or above normal speed for age/gender General Gait Details: no instability or LOB, supervision for safety  Stairs            Wheelchair Mobility    Modified Rankin (Stroke Patients Only)       Balance Overall balance assessment:  Needs assistance Sitting-balance support: Feet supported Sitting balance-Leahy Scale: Good     Standing balance support: During functional activity;No upper extremity supported Standing balance-Leahy Scale: Fair Standing balance comment: pt able to stand at sink to wash hands without UE supports, supervision for safety                             Pertinent Vitals/Pain Pain Assessment: No/denies pain    Home Living Family/patient expects to be discharged to:: Private residence Living Arrangements: Children;Other relatives Available Help at Discharge: Family;Available PRN/intermittently Type of Home: House Home Access: Stairs to enter   [employer] of Steps: 2 Home Layout: One level Home Equipment: Shower seat - built in;Walker - 4 wheels;Cane - single point      Prior Function Level of Independence: Independent         Comments: pt reported that she ambulates unassisted within her home and usually takes her rollator when out in the community     Hand Dominance        Extremity/Trunk Assessment   Upper Extremity Assessment Upper Extremity Assessment: Overall WFL for tasks assessed    Lower Extremity Assessment Lower Extremity Assessment: Overall WFL for tasks assessed    Cervical / Trunk Assessment Cervical / Trunk Assessment: Normal  Communication   Communication: No difficulties  Cognition Arousal/Alertness: Awake/alert Behavior During Therapy: WFL for tasks assessed/performed Overall Cognitive Status: Within Functional Limits for tasks assessed  General Comments      Exercises     Assessment/Plan    PT Assessment Patient needs continued PT services  PT Problem List Decreased balance;Decreased mobility;Decreased coordination;Cardiopulmonary status limiting activity       PT Treatment Interventions DME instruction;Gait training;Stair training;Functional mobility  training;Therapeutic activities;Therapeutic exercise;Balance training;Neuromuscular re-education;Patient/family education    PT Goals (Current goals can be found in the Care Plan section)  Acute Rehab PT Goals Patient Stated Goal: return home PT Goal Formulation: With patient Time For Goal Achievement: 03/15/17 Potential to Achieve Goals: Good    Frequency Min 3X/week   Barriers to discharge        Co-evaluation               End of Session   Activity Tolerance: Patient tolerated treatment well Patient left: in chair;with call bell/phone within reach Nurse Communication: Mobility status PT Visit Diagnosis: Other abnormalities of gait and mobility (R26.89)    Time: 1320-1330 PT Time Calculation (min) (ACUTE ONLY): 10 min   Charges:   PT Evaluation $PT Eval Low Complexity: 1 Procedure     PT G Codes:   PT G-Codes **NOT FOR INPATIENT CLASS** Functional Assessment Tool Used: AM-PAC 6 Clicks Basic Mobility;Clinical judgement Functional Limitation: Mobility: Walking and moving around Mobility: Walking and Moving Around Current Status (Y1117): At least 1 percent but less than 20 percent impaired, limited or restricted Mobility: Walking and Moving Around Goal Status [phone]): 0 percent impaired, limited or restricted    [facility], PT, DPT [place] 03/01/2017, 1:53 PM

## 2017-03-02 DIAGNOSIS — I480 Paroxysmal atrial fibrillation: Secondary | ICD-10-CM | POA: Diagnosis not present

## 2017-03-02 DIAGNOSIS — Z794 Long term (current) use of insulin: Secondary | ICD-10-CM | POA: Diagnosis not present

## 2017-03-02 DIAGNOSIS — N179 Acute kidney failure, unspecified: Secondary | ICD-10-CM

## 2017-03-02 DIAGNOSIS — I5021 Acute systolic (congestive) heart failure: Secondary | ICD-10-CM

## 2017-03-02 DIAGNOSIS — E1122 Type 2 diabetes mellitus with diabetic chronic kidney disease: Secondary | ICD-10-CM | POA: Diagnosis not present

## 2017-03-02 DIAGNOSIS — Z7901 Long term (current) use of anticoagulants: Secondary | ICD-10-CM | POA: Diagnosis not present

## 2017-03-02 DIAGNOSIS — I5023 Acute on chronic systolic (congestive) heart failure: Secondary | ICD-10-CM | POA: Diagnosis not present

## 2017-03-02 DIAGNOSIS — N183 Chronic kidney disease, stage 3 (moderate): Secondary | ICD-10-CM | POA: Diagnosis not present

## 2017-03-02 DIAGNOSIS — D649 Anemia, unspecified: Secondary | ICD-10-CM | POA: Diagnosis not present

## 2017-03-02 LAB — CBC
HCT: 30.2 % — ABNORMAL LOW (ref 36.0–46.0)
HEMOGLOBIN: 9.3 g/dL — AB (ref 12.0–15.0)
MCH: 26.3 pg (ref 26.0–34.0)
MCHC: 30.8 g/dL (ref 30.0–36.0)
MCV: 85.6 fL (ref 78.0–100.0)
PLATELETS: 209 10*3/uL (ref 150–400)
RBC: 3.53 MIL/uL — AB (ref 3.87–5.11)
RDW: 16 % — ABNORMAL HIGH (ref 11.5–15.5)
WBC: 6.7 10*3/uL (ref 4.0–10.5)

## 2017-03-02 LAB — GLUCOSE, CAPILLARY
GLUCOSE-CAPILLARY: 120 mg/dL — AB (ref 65–99)
Glucose-Capillary: 131 mg/dL — ABNORMAL HIGH (ref 65–99)
Glucose-Capillary: 235 mg/dL — ABNORMAL HIGH (ref 65–99)
Glucose-Capillary: 121 mg/dL — ABNORMAL HIGH (ref 65–99)

## 2017-03-02 LAB — BASIC METABOLIC PANEL
ANION GAP: 8 (ref 5–15)
BUN: 29 mg/dL — ABNORMAL HIGH (ref 6–20)
CALCIUM: 8.9 mg/dL (ref 8.9–10.3)
CHLORIDE: 105 mmol/L (ref 101–111)
CO2: 25 mmol/L (ref 22–32)
Creatinine, Ser: 1.86 mg/dL — ABNORMAL HIGH (ref 0.44–1.00)
GFR calc non Af Amer: 24 mL/min — ABNORMAL LOW (ref >60)
GFR, EST AFRICAN AMERICAN: 28 mL/min — AB (ref >60)
Glucose, Bld: 171 mg/dL — ABNORMAL HIGH (ref 65–99)
Potassium: 3.8 mmol/L (ref 3.5–5.1)
SODIUM: 138 mmol/L (ref 135–145)

## 2017-03-02 MED ORDER — DIPHENHYDRAMINE HCL 25 MG PO CAPS
25.0000 mg | ORAL_CAPSULE | Freq: Four times a day (QID) | ORAL | Status: DC | PRN
  Administered 2017-03-06: 25 mg via ORAL
  Filled 2017-03-02: qty 1

## 2017-03-02 MED ORDER — ISOSORBIDE MONONITRATE ER 30 MG PO TB24: 15.0000 mg | ORAL_TABLET | Freq: Every day | ORAL | Status: DC

## 2017-03-02 MED ORDER — RIVAROXABAN 15 MG PO TABS
15.0000 mg | ORAL_TABLET | Freq: Every day | ORAL | Status: DC
  Administered 2017-03-02 – 2017-03-03 (×2): 15 mg via ORAL
  Filled 2017-03-02 (×2): qty 1

## 2017-03-02 MED ORDER — HYDRALAZINE HCL 25 MG PO TABS
12.5000 mg | ORAL_TABLET | Freq: Three times a day (TID) | ORAL | Status: DC
  Administered 2017-03-02 – 2017-03-03 (×3): 12.5 mg via ORAL
  Filled 2017-03-02 (×3): qty 1

## 2017-03-02 MED ORDER — VENLAFAXINE HCL 75 MG PO TABS
75.0000 mg | ORAL_TABLET | Freq: Once | ORAL | Status: DC
  Filled 2017-03-02: qty 1

## 2017-03-02 MED ORDER — METOPROLOL TARTRATE 12.5 MG HALF TABLET
12.5000 mg | ORAL_TABLET | Freq: Two times a day (BID) | ORAL | Status: DC
  Administered 2017-03-02: 12.5 mg via ORAL
  Filled 2017-03-02: qty 1

## 2017-03-02 NOTE — Progress Notes (Signed)
Occupational Therapy Treatment Patient Details Name: Shannon Lucas MRN: [id number] DOB: [birth year] Today's Date: 03/02/2017    History of present illness Pt is an 83 y/o female admitted secondary to dyspnea and bilateral LE edema found to have an acute CHF exacerbation. PMH including but not limited to a-fib, CHF, DM, HTN and CKD.   OT comments  Pt much improved this day  Follow Up Recommendations  No OT follow up    Equipment Recommendations  None recommended by OT       Precautions / Restrictions Precautions Precautions: Fall       Mobility Bed Mobility               General bed mobility comments: Pt OOB  Transfers Overall transfer level: Needs assistance   Transfers: Sit to/from Stand;Stand Pivot Transfers Sit to Stand: Supervision Stand pivot transfers: Supervision                ADL either performed or assessed with clinical judgement   ADL       Grooming: Standing;Supervision/safety       Lower Body Bathing: Supervison/ safety;Sit to/from stand;Cueing for safety   Upper Body Dressing : Set up;Sitting   Lower Body Dressing: Supervision/safety;Sit to/from stand;Cueing for safety Lower Body Dressing Details (indicate cue type and reason): socks Toilet Transfer: Supervision/safety;Ambulation;Regular Toilet   Toileting- Water quality scientist and Hygiene: Supervision/safety;Sit to/from stand;Cueing for safety     Tub/Shower Transfer Details (indicate cue type and reason): verbalized safety in her walker in shower with seat Functional mobility during ADLs: Supervision/safety General ADL Comments: continued to educate pt on energy conservation strategies     [place]ision                          General Comments  pt able to verbalize energy conservation strategies with ADL activity            Progress Toward Goals  OT Goals(current goals can now be found in the care plan section)  Progress towards OT goals: Progressing toward goals    Plan Discharge plan remains appropriate          Activity Tolerance Patient tolerated treatment well   Patient Left in chair with call bell with in reach   Nurse Communication Mobility status    Functional Assessment Tool Used: Clinical judgement Functional Limitation: Self care Self Care Current Status (W2637): At least 1 percent but less than 20 percent impaired, limited or restricted Self Care Goal Status (C5885): At least 1 percent but less than 20 percent impaired, limited or restricted   Time: 1110-1120 OT Time Calculation (min): 10 min  Charges: OT G-codes **NOT FOR INPATIENT CLASS** Functional Assessment Tool Used: Clinical judgement Functional Limitation: Self care Self Care Current Status (O2774): At least 1 percent but less than 20 percent impaired, limited or restricted Self Care Goal Status (J2878): At least 1 percent but less than 20 percent impaired, limited or restricted OT General Charges $OT Visit: 1 Procedure OT Treatments $Self Care/Home Management : 8-22 mins  [place], [place]   Betsy Pries 03/02/2017, 11:42 AM

## 2017-03-02 NOTE — Progress Notes (Signed)
Pt. Requesting benadryl for itching. On call for cardiology paged to make aware. Shannon Lucas, [name]

## 2017-03-02 NOTE — Progress Notes (Signed)
Family Medicine Teaching Service Daily Progress Note Intern Pager: [phone]  Patient name: Shannon Lucas Medical record number: [id number] Date of birth: 1931-03-18 Age: 83 y.o. Gender: female  Primary Care Provider: Manfred Shirts, PA Consultants: None Code Status: Full   Pt Overview and Major Events to Date:  4/17 Admitted for CHF exacerbation  Assessment and Plan: Shannon Lucas is a 83 y.o. female presenting with dyspnea, cough, increasing edema 1 week . PMH is significant for paroxysmal atrial fibrillation on Xarelto, CKD stage 3, type 2 diabetes, GERD, hyperlipidemia, stroke, ? MI, anemia, CHF  HFrEF exacerbation: Physical exam with JVD and 1+ pitting edema. Troponin flat likely 2/2 demand. UOP 0.8 L in last 24 hours. Weight 207 (down 3 # from admit, dry weight 205 in December 2017 per care everywhere). Cr worse today at 1.86. Echo with EF 25-30% and moderate pulmonary HTN. Given pulmonary HTN, may have R heart failure and could benefit from O2 at home. -HF team consult given significantly low systolic function. Appreciate recommendations. -Vital signs per floor protocol -Continuous cardiac monitoring -Continuous pulse ox -40 mg Lasix IV twice a day -Strict I's and O's, Daily weights -DC diltiazem and imdur. Start lopressor 12.5mg  BID for titration, patient will need to changed to toprol before discharge. -Continue home amiodarone 100 mg daily, Cozaar 100 mg daily   AKI on CKD stage III, worsening. Creatinine 1.57>1.86. Baseline creatinine appears to be between 1.49 and 1.6 per care everywhere. -Avoid nephrotoxic agents, difficult to balance given HFrEF  Labile Pulse: Patient has pulses that range from 55-95. Unclear etiology. Possibly some aspect of tachybrady syndrome? Still in normal sinus rhythm.  - Continuous cardiac monitoring   Paroxysmal atrial fibrillation: On Xarelto chronically. Currently in sinus rhythm. -Continue home diltiazem and amiodarone as above -Continue home  Xarelto  First-degree AV block: Seen on admission EKG, not present on repeat EKG -Continuous cardiac monitoring  Prolonged QTc: QTc of 499 on admission increased to QTc 536 on repeat EKG -Avoid QT prolonging medications -Repeat EKG this am -Monitor electrolytes  Type 2 diabetes: At home on Lantus 57 units daily at bedtime. CBG 171, 120 this AM.  -Continue Lantus 30 units daily at bedtime -CBGs with meals and at bedtime -Moderate sliding scale insulin -Hemoglobin A1c 6.9  Hypertension: Normotensive  -Vital signs per floor protocol -Continue Cozaar as above  Hyperlipidemia: Per care everywhere last LDL was 70 in February 2018. -continue home pravastatin 40 mg daily  Anxiety -Hold home venlafaxine due to prolonged QTc  History of asthma -Continue home albuterol inhaler as needed  Anemia: Hemoglobin stable. Anemia panel c/w iron deficiency -FOBT pending - Continue daily iron supplementation - Miralax daily to avoid constipation  ?Dementia: Not on problem list but there were some concerns.   ?Frequent falls: Per patient report this morning. Could possibly be due to worsening CHF. Patient's grand daughter and great granddaughter did not mention this on admission.  - If she is at risk for increased falls, she may not be a great candidate for long term anticoagulation - PT/OT  Hx of hematuria: Normal UA on admission  FEN/GI: Saline lock IV, carb modified/heart healthy diet Prophylaxis: Xarelto  Disposition: pending medical management given significantly low systolic function with fluid overload  Subjective:  Patient feeling improved this morning but states is dyspneic on exertion. States at baseline is not SOB with exertion. No SOB at rest. No CP, palpitations. Feels swelling in her legs is improved but not back to baseline.  Objective: Temp:  [  98.2 F (36.8 C)-98.7 F (37.1 C)] 98.2 F (36.8 C) (04/19 0900) Pulse Rate:  [55-85] 55 (04/19 0900) Resp:  [16-18] 16  (04/19 0900) BP: (128-146)/(50-64) 146/54 (04/19 0900) SpO2:  [97 %-99 %] 99 % (04/19 0900) Weight:  [207 lb 3.2 oz (94 kg)] 207 lb 3.2 oz (94 kg) (04/19 0532) Physical Exam: General: Elderly female in no acute distress, sitting up in bed Cardiovascular: Bradycardic rate, regular rhythm, no murmurs, 1+ pitting edema to mid thigh bilaterally. + JVD Respiratory: Mildly increased WOB. Occasional wheezes but no crackles. Good air movement bilaterally. Gastrointestinal: Soft, nondistended, nontender, normal bowel sounds MSK: Moves all extremities spontaneously, full range of motion of all joints Derm: 2 inch area of ecchymosis on right upper quadrant Neuro: Alert and oriented 3  Laboratory:  Recent Labs Lab 02/28/17 1629 03/01/17 0932 03/02/17 0324  WBC 7.9 6.7 6.7  HGB 9.8* 9.4* 9.3*  HCT 31.8* 29.4* 30.2*  PLT 211 198 209    Recent Labs Lab 02/28/17 1629 03/01/17 0312 03/02/17 0324  NA 140 141 138  K 3.7 3.7 3.8  CL 108 108 105  CO2 22 25 25   BUN 22* 25* 29*  CREATININE 1.57* 1.79* 1.86*  CALCIUM 9.2 8.9 8.9  GLUCOSE 133* 152* 171*    Iron/TIBC/Ferritin/ %Sat    Component Value Date/Time   IRON 19 (L) 02/28/2017 2202   TIBC 381 02/28/2017 2202   FERRITIN 34 02/28/2017 2202   IRONPCTSAT 5 (L) 02/28/2017 2202   Lipid panel per care everywhere Chol 149 TG 66 LDL77 HDL59  Imaging/Diagnostic Tests: No results found.  Bufford Lope, DO 03/02/2017, 9:31 AM PGY-1, [place] Intern pager: [phone], text pages welcome

## 2017-03-02 NOTE — Consult Note (Addendum)
Advanced Heart Failure Team Consult Note   Primary Physician: Lanier Prude PA  Primary Cardiologist:  Dr Peggye Form   Reason for Consultation: Heart Failure   HPI:    Shannon Lucas is seen today for evaluation of heart failure at the request of Dr Andria Frames.   Shannon Lucas is a 83 year old with history of PAF on xarelto, CKD Stage III, DMII, GERD, hyperlipidemia, CVA, MI, anemia, and heart failure. Followed by Dr Otho Perl at [facility] was last seen March 2018 that time she was stable weight at that time was 208 pounds. Had a LHC 15 year ago.   3 months prior to admit she developed increased dyspnea with occasional chest tightness. Says she occasionally takes sl ntg. She did not seek medical treatment. Weight at home had gone up from 200 to 210 pounds. 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 Lives with daughter.   Presented to [facility] ED with increased dyspnea and edema from A/C Systolic Heart Failure. She has been diuresing with IV lasix. Weight has gone down 3 pounds.  CXR on admit concerning for pulmonary hypertension. ECHO completed and showed reduced EF 25-30%. Pertinent admission labs include: K 3.7, Creatinine 1.57, Hgb 9.8, troponin 0.05, Iron 19, Saturation ratio 5, BNP 1481, and TSH 3.3. Creatinine has been trending up 1.5> 1.7> 1.86.   Today she is feeling better. Denies SOB at rest. Mild dyspnea with exertion.   ECHO 03/01/2017 EF 25-30% Peak PA pressure 42 mm hg.     Review of Systems: [y] = yes, [ ]  = no   General: Weight gain [Y ]; Weight loss [ ] ; Anorexia [ ] ; Fatigue [Y ]; Fever [ ] ; Chills [ ] ; Weakness [ ]   Cardiac: Chest pain/pressure [ ] ; Resting SOB [ ] ; Exertional SOB [Y ]; Orthopnea [ ] ; Pedal Edema [ ] ; Palpitations [ ] ; Syncope [ ] ; Presyncope [ ] ; Paroxysmal nocturnal dyspnea[ ]   Pulmonary: Cough [ ] ; Wheezing[ ] ; Hemoptysis[ ] ; Sputum [ ] ; Snoring [ ]   GI: Vomiting[ ] ; Dysphagia[ ] ; Melena[ ] ; Hematochezia [ ] ; Heartburn[ ] ; Abdominal pain [ ] ; Constipation [ ] ; Diarrhea [ ] ;  BRBPR [ ]   GU: Hematuria[ ] ; Dysuria [ ] ; Nocturia[ ]   Vascular: Pain in legs with walking [ ] ; Pain in feet with lying flat [ ] ; Non-healing sores [ ] ; Stroke [ ] ; TIA [ ] ; Slurred speech [ ] ;  Neuro: Headaches[ ] ; Vertigo[ ] ; Seizures[ ] ; Paresthesias[ ] ;Blurred vision [ ] ; Diplopia [ ] ; Vision changes [ ]   Ortho/Skin: Arthritis [ ] ; Joint pain [Y ]; Muscle pain [ ] ; Joint swelling [ ] ; Back Pain [ Y]; Rash [ ]   Psych: Depression[ ] ; Anxiety[ ]   Heme: Bleeding problems [ ] ; Clotting disorders [ ] ; Anemia [ ]   Endocrine: Diabetes [Y ]; Thyroid dysfunction[ ]   Home Medications Prior to Admission medications   Medication Sig Start Date End Date Taking? Authorizing Provider  amiodarone (PACERONE) 200 MG tablet Take 100 mg by mouth daily. 07/28/16  Yes Historical Provider, MD  diltiazem (CARDIZEM CD) 180 MG 24 hr capsule Take 180 mg by mouth daily. 12/28/16  Yes Historical Provider, MD  isosorbide mononitrate (IMDUR) 30 MG 24 hr tablet Take 30 mg by mouth daily. 02/24/17  Yes Historical Provider, MD  LANTUS SOLOSTAR 100 UNIT/ML Solostar Pen Inject 56 Units into the skin at bedtime.  02/24/17  Yes Historical Provider, MD  losartan (COZAAR) 100 MG tablet Take 100 mg by mouth daily. 01/18/17  Yes  Historical Provider, MD  pravastatin (PRAVACHOL) 40 MG tablet Take 40 mg by mouth daily. 12/08/16  Yes Historical Provider, MD  venlafaxine (EFFEXOR) 75 MG tablet Take 75 mg by mouth daily.  02/03/17  Yes Historical Provider, MD  VENTOLIN HFA 108 (90 Base) MCG/ACT inhaler Inhale 2 puffs into the lungs every 6 (six) hours as needed for wheezing or shortness of breath.  02/13/17  Yes Historical Provider, MD  XARELTO 15 MG TABS tablet Take 15 mg by mouth daily. 01/31/17  Yes Historical Provider, MD    Past Medical History: Past Medical History:  Diagnosis Date  . A-fib ([place])   . Arthritis    "a little bit qwhere" (02/28/2017)  . CHF (congestive heart failure) ([place])   . CKD (chronic kidney disease), stage III     Shannon Lucas 02/28/2017  . Depression   . GERD (gastroesophageal reflux disease)   . High cholesterol   . Hypertension   . Melanoma of back ([place])   . Myocardial infarction [facility])    "one dr said I'd had 1" (02/28/2017)  . Stroke [facility]) ~ 2010; ~2012   "affected the right side but I fully recovered; just a light one" (02/28/2017)  . Type II diabetes mellitus ([place])     Past Surgical History: Past Surgical History:  Procedure Laterality Date  . APPENDECTOMY    . KNEE SURGERY Left 1970s   "I have 1/3 of my knee left in there"  . LAPAROSCOPIC CHOLECYSTECTOMY    . MELANOMA EXCISION     "back"  . TUBAL LIGATION      Family History: No premature CAD.  No CHF.   Social History: Social History   Social History  . Marital status: Widowed    Spouse name: N/A  . Number of children: N/A  . Years of education: N/A   Social History Main Topics  . Smoking status: Former Research scientist (life sciences)  . Smokeless tobacco: Never Used     Comment: 02/28/2017 "only smoked in the 1960s when we went out"  . Alcohol use No  . Drug use: No  . Sexual activity: No   Other Topics Concern  . None   Social History Narrative  . None    Allergies:  No Known Allergies  Objective:    Vital Signs:   Temp:  [98.2 F (36.8 C)-98.7 F (37.1 C)] 98.2 F (36.8 C) (04/19 0900) Pulse Rate:  [55-85] 55 (04/19 0900) Resp:  [16-18] 16 (04/19 0900) BP: (128-146)/(50-64) 146/54 (04/19 0900) SpO2:  [97 %-99 %] 99 % (04/19 0900) Weight:  [207 lb 3.2 oz (94 kg)] 207 lb 3.2 oz (94 kg) (04/19 0532) Last BM Date: 02/26/17  Weight change: Filed Weights   02/28/17 2158 03/01/17 0429 03/02/17 0532  Weight: 210 lb 4.8 oz (95.4 kg) 210 lb 4.8 oz (95.4 kg) 207 lb 3.2 oz (94 kg)    Intake/Output:   Intake/Output Summary (Last 24 hours) at 03/02/17 1154 Last data filed at 03/02/17 0900  Gross per 24 hour  Intake              720 ml  Output              800 ml  Net              -80 ml     Physical Exam: General:  Elderly. Well  appearing. No resp difficulty. Sitting in the chair HEENT: normal Neck: supple. JVP 7 cm but with HJR. Carotids 2+ bilat; no bruits. No  lymphadenopathy or thyromegaly appreciated. Cor: PMI nondisplaced. Regular rate & rhythm. No rubs, gallops or murmurs. Lungs: clear on room air. Abdomen: obese, soft, nontender, nondistended. No hepatosplenomegaly. No bruits or masses. Good bowel sounds. Extremities: no cyanosis, clubbing, rash, R and LLE trace edema Neuro: alert & orientedx3, cranial nerves grossly intact. moves all 4 extremities w/o difficulty. Affect pleasant  Telemetry: NSR 40s   Labs: Basic Metabolic Panel:  Recent Labs Lab 02/28/17 1629 03/01/17 0312 03/02/17 0324  NA 140 141 138  K 3.7 3.7 3.8  CL 108 108 105  CO2 22 25 25   GLUCOSE 133* 152* 171*  BUN 22* 25* 29*  CREATININE 1.57* 1.79* 1.86*  CALCIUM 9.2 8.9 8.9    Liver Function Tests: No results for input(s): AST, ALT, ALKPHOS, BILITOT, PROT, ALBUMIN in the last 168 hours. No results for input(s): LIPASE, AMYLASE in the last 168 hours. No results for input(s): AMMONIA in the last 168 hours.  CBC:  Recent Labs Lab 02/28/17 1629 03/01/17 0932 03/02/17 0324  WBC 7.9 6.7 6.7  HGB 9.8* 9.4* 9.3*  HCT 31.8* 29.4* 30.2*  MCV 86.4 87.0 85.6  PLT 211 198 209    Cardiac Enzymes:  Recent Labs Lab 02/28/17 2202 03/01/17 0312 03/01/17 0932  TROPONINI 0.05* 0.05* 0.04*    BNP: BNP (last 3 results)  Recent Labs  02/28/17 1629  BNP 1,488.1*    ProBNP (last 3 results) No results for input(s): PROBNP in the last 8760 hours.   CBG:  Recent Labs Lab 03/01/17 0725 03/01/17 1145 03/01/17 1624 03/01/17 2245 03/02/17 0754  GLUCAP 108* 139* 135* 177* 120*    Coagulation Studies: No results for input(s): LABPROT, INR in the last 72 hours.  Other results: EKG: Sinus Rhythm 1st degree heart block.   Imaging:  No results found.   Medications:     Current Medications: . amiodarone  100 mg  Oral Daily  . ferrous sulfate  325 mg Oral TID WC  . furosemide  40 mg Intravenous BID  . insulin aspart  0-15 Units Subcutaneous TID WC  . insulin glargine  30 Units Subcutaneous QHS  . losartan  100 mg Oral Daily  . metoprolol tartrate  12.5 mg Oral BID  . polyethylene glycol  17 g Oral Daily  . pravastatin  40 mg Oral QHS  . Rivaroxaban  15 mg Oral QHS  . sodium chloride flush  3 mL Intravenous Q12H     Infusions: . sodium chloride        Assessment/Plan  Shannon Lucas is an 83 year old with a history of PAF, MI, DMII, PAF, and CKD Stage III admitted with acute HF exacerbation.   1. Acute Systolic Heart Failure  NYHA IIIB. Has had MI in the past. Last cath about 15 years ago.   ECHO EF 25-30%.  Stop lopressor with reduced EF. Hold off on carvedilol. Heart in the 40s.  She has been diuresed with IV lasix and appears euvolemic today. Creatinine also trending up likely related to diuresis. I dont think she has low output heart failure.   Hold IV lasix and losartan. Would not add dig or spiro with elevated creatinine.  Start hydralazine 12.5 mg thee times a day and 15 mg imdur daily.  Add ted hose. Consult cardiac rehab.  Would benefit from [facility]  with intermittent CP and h/o of MI. Need to watch creatinine closely.  2. AKI /CKD Stage III-Creatinine baseline 1.5. Creatinine trending up to 1.86 with GFR about 24.  As above stop losartan and lasix.   BMET in am.  3. PAF- Maintaining NSR. Stop lopressor and continue amio 100 mg daily. On Xarelto 15 mg  4. DMII- Per primary team.  5. Anemia- Iron Sats low. Started on iron.   Anticipate RHC/LHC on Monday.   Length of Stay: 1  Amy Clegg, NP  03/02/2017, 11:54 AM  Advanced Heart Failure Team Pager [phone] (M-F; 7a - 4p)  Please contact [facility] for night-coverage after hours (4p -7a ) and weekends on [email or web address]  Patient seen with NP, agree with the above note.  She was admitted primarily with exertional dyspnea though she  had had some episodes of chest pain as well. She was noted to be volume overloaded and has been diuresed.  Echo showed EF 25-30%, no prior history of low EF or CHF.  She does have a history of CAD, had cath about 15 years ago for "small MI."  She was told that she had CAD but there was no intervention.   On exam today, she is not significantly volume overloaded (does have HJR).  She has been diuresed with IV Lasix.  - Stop IV Lasix today, start po tomorrow if creatinine stable to lower.  - Stop losartan for now with rise in creatinine to 1.86, will use hydralazine/nitrates for the time being.  - Stop metoprolol tartrate with low EF. HR in upper 40s-50s so will not start carvedilol.  - Should have eventual right and left heart cath.  Not urgent (no active chest pain), so would allow creatinine to trend back down a bit (GFR < 30 today).  Possibly Monday, would need to hold Xarelto after Saturday. Discussed risks/benefits with patient, she agrees to proceed.   H/o PAF, maintaining NSR currently.  Would continue amiodarone 100 daily (home med).   Loralie Champagne 03/02/2017 1:05 PM

## 2017-03-03 DIAGNOSIS — E1159 Type 2 diabetes mellitus with other circulatory complications: Secondary | ICD-10-CM | POA: Diagnosis not present

## 2017-03-03 DIAGNOSIS — I5021 Acute systolic (congestive) heart failure: Secondary | ICD-10-CM | POA: Diagnosis not present

## 2017-03-03 DIAGNOSIS — I130 Hypertensive heart and chronic kidney disease with heart failure and stage 1 through stage 4 chronic kidney disease, or unspecified chronic kidney disease: Secondary | ICD-10-CM | POA: Diagnosis present

## 2017-03-03 DIAGNOSIS — F419 Anxiety disorder, unspecified: Secondary | ICD-10-CM | POA: Diagnosis present

## 2017-03-03 DIAGNOSIS — D649 Anemia, unspecified: Secondary | ICD-10-CM | POA: Diagnosis not present

## 2017-03-03 DIAGNOSIS — E1122 Type 2 diabetes mellitus with diabetic chronic kidney disease: Secondary | ICD-10-CM | POA: Diagnosis present

## 2017-03-03 DIAGNOSIS — E785 Hyperlipidemia, unspecified: Secondary | ICD-10-CM | POA: Diagnosis present

## 2017-03-03 DIAGNOSIS — D509 Iron deficiency anemia, unspecified: Secondary | ICD-10-CM | POA: Diagnosis present

## 2017-03-03 DIAGNOSIS — I509 Heart failure, unspecified: Secondary | ICD-10-CM | POA: Diagnosis not present

## 2017-03-03 DIAGNOSIS — Z7901 Long term (current) use of anticoagulants: Secondary | ICD-10-CM | POA: Diagnosis not present

## 2017-03-03 DIAGNOSIS — R0602 Shortness of breath: Secondary | ICD-10-CM | POA: Diagnosis present

## 2017-03-03 DIAGNOSIS — Z794 Long term (current) use of insulin: Secondary | ICD-10-CM | POA: Diagnosis not present

## 2017-03-03 DIAGNOSIS — R001 Bradycardia, unspecified: Secondary | ICD-10-CM | POA: Diagnosis present

## 2017-03-03 DIAGNOSIS — I2510 Atherosclerotic heart disease of native coronary artery without angina pectoris: Secondary | ICD-10-CM | POA: Diagnosis not present

## 2017-03-03 DIAGNOSIS — N179 Acute kidney failure, unspecified: Secondary | ICD-10-CM | POA: Diagnosis present

## 2017-03-03 DIAGNOSIS — Z823 Family history of stroke: Secondary | ICD-10-CM | POA: Diagnosis not present

## 2017-03-03 DIAGNOSIS — I440 Atrioventricular block, first degree: Secondary | ICD-10-CM | POA: Diagnosis present

## 2017-03-03 DIAGNOSIS — R0902 Hypoxemia: Secondary | ICD-10-CM | POA: Diagnosis present

## 2017-03-03 DIAGNOSIS — N186 End stage renal disease: Secondary | ICD-10-CM | POA: Diagnosis not present

## 2017-03-03 DIAGNOSIS — Z820 Family history of epilepsy and other diseases of the nervous system: Secondary | ICD-10-CM | POA: Diagnosis not present

## 2017-03-03 DIAGNOSIS — I4581 Long QT syndrome: Secondary | ICD-10-CM | POA: Diagnosis present

## 2017-03-03 DIAGNOSIS — Z8673 Personal history of transient ischemic attack (TIA), and cerebral infarction without residual deficits: Secondary | ICD-10-CM | POA: Diagnosis not present

## 2017-03-03 DIAGNOSIS — Z87891 Personal history of nicotine dependence: Secondary | ICD-10-CM | POA: Diagnosis not present

## 2017-03-03 DIAGNOSIS — N183 Chronic kidney disease, stage 3 (moderate): Secondary | ICD-10-CM | POA: Diagnosis present

## 2017-03-03 DIAGNOSIS — I252 Old myocardial infarction: Secondary | ICD-10-CM | POA: Diagnosis not present

## 2017-03-03 DIAGNOSIS — Z809 Family history of malignant neoplasm, unspecified: Secondary | ICD-10-CM | POA: Diagnosis not present

## 2017-03-03 DIAGNOSIS — I480 Paroxysmal atrial fibrillation: Secondary | ICD-10-CM | POA: Diagnosis present

## 2017-03-03 DIAGNOSIS — I5023 Acute on chronic systolic (congestive) heart failure: Secondary | ICD-10-CM | POA: Diagnosis present

## 2017-03-03 DIAGNOSIS — J45909 Unspecified asthma, uncomplicated: Secondary | ICD-10-CM | POA: Diagnosis present

## 2017-03-03 DIAGNOSIS — Z992 Dependence on renal dialysis: Secondary | ICD-10-CM | POA: Diagnosis not present

## 2017-03-03 DIAGNOSIS — Z833 Family history of diabetes mellitus: Secondary | ICD-10-CM | POA: Diagnosis not present

## 2017-03-03 LAB — BASIC METABOLIC PANEL
Anion gap: 14 (ref 5–15)
BUN: 33 mg/dL — AB (ref 6–20)
CHLORIDE: 98 mmol/L — AB (ref 101–111)
CO2: 25 mmol/L (ref 22–32)
CREATININE: 2.12 mg/dL — AB (ref 0.44–1.00)
Calcium: 9.1 mg/dL (ref 8.9–10.3)
GFR calc Af Amer: 24 mL/min — ABNORMAL LOW (ref >60)
GFR calc non Af Amer: 20 mL/min — ABNORMAL LOW (ref >60)
GLUCOSE: 121 mg/dL — AB (ref 65–99)
POTASSIUM: 3.9 mmol/L (ref 3.5–5.1)
Sodium: 137 mmol/L (ref 135–145)

## 2017-03-03 LAB — GLUCOSE, CAPILLARY
GLUCOSE-CAPILLARY: 204 mg/dL — AB (ref 65–99)
GLUCOSE-CAPILLARY: 153 mg/dL — AB (ref 65–99)
Glucose-Capillary: 127 mg/dL — ABNORMAL HIGH (ref 65–99)
Glucose-Capillary: 141 mg/dL — ABNORMAL HIGH (ref 65–99)

## 2017-03-03 MED ORDER — POLYETHYLENE GLYCOL 3350 17 G PO PACK
17.0000 g | PACK | Freq: Two times a day (BID) | ORAL | Status: DC
  Administered 2017-03-03 – 2017-03-06 (×5): 17 g via ORAL
  Filled 2017-03-03 (×7): qty 1

## 2017-03-03 MED ORDER — HYDRALAZINE HCL 25 MG PO TABS
25.0000 mg | ORAL_TABLET | Freq: Three times a day (TID) | ORAL | Status: DC
  Administered 2017-03-03 – 2017-03-07 (×12): 25 mg via ORAL
  Filled 2017-03-03 (×12): qty 1

## 2017-03-03 MED ORDER — ISOSORBIDE MONONITRATE ER 30 MG PO TB24
30.0000 mg | ORAL_TABLET | Freq: Every day | ORAL | Status: DC
  Administered 2017-03-03 – 2017-03-07 (×5): 30 mg via ORAL
  Filled 2017-03-03 (×5): qty 1

## 2017-03-03 NOTE — Progress Notes (Signed)
Family Medicine Teaching Service Daily Progress Note Intern Pager: [phone]  Patient name: Shannon Lucas Medical record number: [id number] Date of birth: 05/02/1931 Age: 83 y.o. Gender: female  Primary Care Provider: Manfred Shirts, PA Consultants: None Code Status: Full   Pt Overview and Major Events to Date:  4/17 Admitted for CHF exacerbation  Assessment and Plan: Shannon Lucas is a 83 y.o. female presenting with dyspnea, cough, increasing edema 1 week . PMH is significant for paroxysmal atrial fibrillation on Xarelto, CKD stage 3, type 2 diabetes, GERD, hyperlipidemia, stroke, ? MI, anemia, CHF  HFrEF exacerbation: Euvolemic on exam. Output of 1.15 L,last weight 207 lbs (near dry weight of 206 lbs).  Echo with EF 25-30% and moderate pulmonary HTN.  -HF team following, hydralazine 25mg  TID and imdur 30mg  qd. Stop Xarelto 4/21 (order in place for last dose on 4/21 - RHC/LHC on Monday 4/23) -Strict I's and O's, Daily weights -Continue home amiodarone 100 mg daily.   AKI on CKD stage III. Baseline creatinine 1.49-1.6 per care everywhere. Scr 1.92 (improved slightly from yesterday) -Hold diuretics and home cozaar -Avoid nephrotoxic agents.  Bradycardia, improved: HR 50-60s in NSR, noted 1st degree heart block on telemetry  - AM EKG with sinus bradycardia and 1-st degree block - Holding beta blocker   - Continuous cardiac monitoring   Paroxysmal atrial fibrillation: On Xarelto chronically. Currently in sinus rhythm. -Continue home amiodarone as above -Continue home Xarelto, will stop tomorrow 4/21 in preparation for cath 4/23  Prolonged QTc: QTc of 499 on admission increased to QTc 536 on repeat EKG -Avoid QT prolonging medications -Monitor electrolytes  Type 2 diabetes: Hemoglobin A1c 6.9. At home on Lantus 57 units daily at bedtime. CBG 121 this AM.  -Continue Lantus 30 units daily at bedtime -> consider stopping/decreasing Lantus Monday as will be NPO for cath  -Moderate sliding  scale insulin -CBGs with meals and at bedtime   Hypertension: BP improved, though diastolic low; BP 789/38 - Previously  hydralazine and imdur increased per HF team; with worsening fall might consider getting orthostatics at some point  - holding home cozaar in the setting of AKI  Hyperlipidemia:  -continue home pravastatin 40 mg daily  Anxiety -Hold home venlafaxine due to prolonged QTc  History of asthma -Continue home albuterol inhaler as needed  Anemia: Hemoglobin improving. Anemia panel c/w iron deficiency. - Consider FOBT as an outpatient -  Continue daily iron supplementation -  Miralax daily to avoid constipation  ?Dementia: Not on problem list but there were some concerns.   ?Frequent falls: Patient indicates tripping a couple of times at home, these fall sound mechanical per discussion with patient. Denies any dizziness or presyncope  - If she is at risk for increased falls, she may not be a great candidate for long term anticoagulation  - PT/OT  Hx of hematuria: Normal UA on admission  FEN/GI: Saline lock IV, carb modified/heart healthy diet Prophylaxis: Xarelto  Disposition: pending medical management given significantly low systolic function with fluid overload  Subjective:  Patient is doing well. No chest pain, SOB. She states the swelling in her legs has gone down. She denied any dizziness/orthostatsis . She indicates no SOB while walking to the bathroom.   Objective: Temp:  [98.3 F (36.8 C)] 98.3 F (36.8 C) (04/21 0631) Pulse Rate:  [52-69] 58 (04/21 0631) Resp:  [18] 18 (04/21 0631) BP: (129-138)/(48-55) 132/55 (04/21 0631) SpO2:  [93 %-99 %] 93 % (04/21 0631) Weight:  [207 lb 9.6 oz (  94.2 kg)] 207 lb 9.6 oz (94.2 kg) (04/21 0631) Physical Exam: General: Elderly female in no acute distress, sitting at the sink  Cardiovascular: RRR, no murmurs, 1+ pitting edema to knee No JVD Respiratory: Normal effort on room air. CTAB Gastrointestinal: Soft,  nondistended, nontender, normal bowel sounds MSK: Moves all extremities spontaneously, full range of motion of all joints  Laboratory:  Recent Labs Lab 03/01/17 0932 03/02/17 0324 03/04/17 0636  WBC 6.7 6.7 7.8  HGB 9.4* 9.3* 10.2*  HCT 29.4* 30.2* 33.2*  PLT 198 209 243    Recent Labs Lab 03/01/17 0312 03/02/17 0324 03/03/17 0408  NA 141 138 137  K 3.7 3.8 3.9  CL 108 105 98*  CO2 25 25 25   BUN 25* 29* 33*  CREATININE 1.79* 1.86* 2.12*  CALCIUM 8.9 8.9 9.1  GLUCOSE 152* 171* 121*    Iron/TIBC/Ferritin/ %Sat    Component Value Date/Time   IRON 19 (L) 02/28/2017 2202   TIBC 381 02/28/2017 2202   FERRITIN 34 02/28/2017 2202   IRONPCTSAT 5 (L) 02/28/2017 2202   Lipid panel per care everywhere Chol 149 TG 66 LDL77 HDL59  Imaging/Diagnostic Tests: No results found.  Shannon Cletis Media, MD 03/04/2017, 7:22 AM PGY-2, [place] Intern pager: [phone], text pages welcome

## 2017-03-03 NOTE — Progress Notes (Signed)
Pt educated about safety and importance of bed alarm during the night however pt refuses to be on bed alarm. Will continue to round on patient.   Adom Schoeneck, RN    

## 2017-03-03 NOTE — Progress Notes (Signed)
Paged MD regarding pt vital signs. Pt scheduled to receive hydralazine and no parameter orders. Awaiting call back. MD stated to give hydralizine, parameters are to hold if systolic lower than 85. MD aware of BP and HR.  Eboni Coval Leory Plowman

## 2017-03-03 NOTE — Progress Notes (Signed)
Family Medicine Teaching Service Daily Progress Note Intern Pager: [phone]  Patient name: Shannon Lucas Medical record number: [id number] Date of birth: 1931-05-20 Age: 83 y.o. Gender: female  Primary Care Provider: Manfred Shirts, PA Consultants: None Code Status: Full   Pt Overview and Major Events to Date:  4/17 Admitted for CHF exacerbation  Assessment and Plan: Shannon Lucas is a 83 y.o. female presenting with dyspnea, cough, increasing edema 1 week . PMH is significant for paroxysmal atrial fibrillation on Xarelto, CKD stage 3, type 2 diabetes, GERD, hyperlipidemia, stroke, ? MI, anemia, CHF  HFrEF exacerbation: Euvolemic at at dry weight 206 (down 4# from admit). Cr worse today at 2.12. Echo with EF 25-30% and moderate pulmonary HTN. Given pulmonary HTN, may have R heart failure and could benefit from O2 at home. -HF team following, hydralazine 25mg  TID and imdur 30mg  qd. Stop Xarelto 4/21  for RHC/LHC on Monday 4/23. -Vital signs per floor protocol -Continuous cardiac monitoring -Continuous pulse ox -Strict I's and O's, Daily weights -Continue home amiodarone 100 mg daily.   AKI on CKD stage III, worsening. Creatinine 2.12. Baseline creatinine 1.49-1.6 per care everywhere. -Hold diuretics and home cozaar -Avoid nephrotoxic agents.  Bradycardia, improved: HR 50-60s in NSR - Holding beta blocker  - Continuous cardiac monitoring   Paroxysmal atrial fibrillation: On Xarelto chronically. Currently in sinus rhythm. -Continue home amiodarone as above -Continue home Xarelto, will stop tomorrow 4/21 in preparation for cath 4/23  Prolonged QTc: QTc of 499 on admission increased to QTc 536 on repeat EKG -Avoid QT prolonging medications -Repeat EKG this am -Monitor electrolytes  Type 2 diabetes: Hemoglobin A1c 6.9. At home on Lantus 57 units daily at bedtime. CBG 121 this AM.  -Continue Lantus 30 units daily at bedtime -Moderate sliding scale insulin -CBGs with meals and at  bedtime  Hypertension: BP elevated 151/71 -Increase hydralazine and imdur per HF team as above -holding home cozaar in the setting of AKI  Hyperlipidemia:  -continue home pravastatin 40 mg daily  Anxiety -Hold home venlafaxine due to prolonged QTc  History of asthma -Continue home albuterol inhaler as needed  Anemia: Hemoglobin stable. Anemia panel c/w iron deficiency - Continue daily iron supplementation - Miralax daily to avoid constipation  ?Dementia: Not on problem list but there were some concerns.   ?Frequent falls: Per patient report this morning. Could possibly be due to worsening CHF. Patient's grand daughter and great granddaughter did not mention this on admission.  - If she is at risk for increased falls, she may not be a great candidate for long term anticoagulation  - PT/OT  Hx of hematuria: Normal UA on admission  FEN/GI: Saline lock IV, carb modified/heart healthy diet Prophylaxis: Xarelto  Disposition: pending medical management given significantly low systolic function with fluid overload  Subjective:  Patient feels well this morning. NO SOB at rest but states is still dyspneic on exertion. No CP, palpitations. C/o constipation, last BM was before admission  Objective: Temp:  [97.9 F (36.6 C)-98.2 F (36.8 C)] 98.2 F (36.8 C) (04/20 0600) Pulse Rate:  [52-62] 62 (04/20 0600) Resp:  [17-20] 20 (04/20 0600) BP: (116-151)/(58-71) 151/71 (04/20 0600) SpO2:  [93 %-97 %] 95 % (04/20 0600) Weight:  [206 lb 11.2 oz (93.8 kg)] 206 lb 11.2 oz (93.8 kg) (04/20 0600) Physical Exam: General: Elderly female in no acute distress, sitting up in bed Cardiovascular: RRR, no murmurs, 1+ pitting edema to midshin bilaterally. No JVD Respiratory: Normal effort on room air. Occasional  wheezes but no crackles. Good air movement bilaterally. Gastrointestinal: Soft, nondistended, nontender, normal bowel sounds MSK: Moves all extremities spontaneously, full range of  motion of all joints Neuro: Alert and oriented 3  Laboratory:  Recent Labs Lab 02/28/17 1629 03/01/17 0932 03/02/17 0324  WBC 7.9 6.7 6.7  HGB 9.8* 9.4* 9.3*  HCT 31.8* 29.4* 30.2*  PLT 211 198 209    Recent Labs Lab 03/01/17 0312 03/02/17 0324 03/03/17 0408  NA 141 138 137  K 3.7 3.8 3.9  CL 108 105 98*  CO2 25 25 25   BUN 25* 29* 33*  CREATININE 1.79* 1.86* 2.12*  CALCIUM 8.9 8.9 9.1  GLUCOSE 152* 171* 121*    Iron/TIBC/Ferritin/ %Sat    Component Value Date/Time   IRON 19 (L) 02/28/2017 2202   TIBC 381 02/28/2017 2202   FERRITIN 34 02/28/2017 2202   IRONPCTSAT 5 (L) 02/28/2017 2202   Lipid panel per care everywhere Chol 149 TG 66 LDL77 HDL59  Imaging/Diagnostic Tests: No results found.  Shannon Lope, DO 03/03/2017, 9:07 AM PGY-1, [place] Intern pager: [phone], text pages welcome

## 2017-03-03 NOTE — Progress Notes (Signed)
Pt states she fell at home several months ago. Pt ambulates with walker, is aware she needs to call staff for assistance. Pt educated on bed alarm, pt refuses to have bed alarm set. Pt is aware of why she is at risk and still refuses. Pt has yellow socks on and fall risk arm band.   Aldea Avis

## 2017-03-03 NOTE — Progress Notes (Signed)
Physical Therapy Treatment Patient Details Name: Shannon Lucas MRN: [id number] DOB: Jan 29, 1931 Today's Date: 03/03/2017    History of Present Illness Pt is an 83 y/o female admitted secondary to dyspnea and bilateral LE edema found to have an acute CHF exacerbation. PMH including but not limited to a-fib, CHF, DM, HTN and CKD.    PT Comments    Pt presented supine in bed with HOB elevated, awake and willing to participate in therapy session. Pt somewhat lethargic at beginning of session likely related to medication per RN. PT will continue to follow to ensure a safe d/c home.    Follow Up Recommendations  No PT follow up     Equipment Recommendations  None recommended by PT    Recommendations for Other Services       Precautions / Restrictions Precautions Precautions: Fall Restrictions Weight Bearing Restrictions: No    Mobility  Bed Mobility Overal bed mobility: Modified Independent             General bed mobility comments: increased time  Transfers Overall transfer level: Needs assistance Equipment used: Rolling walker (2 wheeled) Transfers: Sit to/from Stand Sit to Stand: Supervision         General transfer comment: increased time, supervision for safety  Ambulation/Gait Ambulation/Gait assistance: Supervision Ambulation Distance (Feet): 50 Feet Assistive device: Rolling walker (2 wheeled) Gait Pattern/deviations: Step-through pattern Gait velocity: WFL Gait velocity interpretation: at or above normal speed for age/gender General Gait Details: no instability or LOB, supervision for safety   Stairs            Wheelchair Mobility    Modified Rankin (Stroke Patients Only)       Balance Overall balance assessment: Needs assistance Sitting-balance support: Feet supported Sitting balance-Leahy Scale: Good     Standing balance support: During functional activity;No upper extremity supported Standing balance-Leahy Scale: Fair                               Cognition Arousal/Alertness: Lethargic;Suspect due to medications Behavior During Therapy: [facility] for tasks assessed/performed Overall Cognitive Status: Within Functional Limits for tasks assessed                                        Exercises      General Comments        Pertinent Vitals/Pain Pain Assessment: No/denies pain    Home Living                      Prior Function            PT Goals (current goals can now be found in the care plan section) Acute Rehab PT Goals PT Goal Formulation: With patient Time For Goal Achievement: 03/15/17 Potential to Achieve Goals: Good Progress towards PT goals: Progressing toward goals    Frequency    Min 3X/week      PT Plan Current plan remains appropriate    Co-evaluation             End of Session Equipment Utilized During Treatment: Gait belt Activity Tolerance: Patient tolerated treatment well Patient left: in chair;with call bell/phone within reach Nurse Communication: Mobility status PT Visit Diagnosis: Other abnormalities of gait and mobility (R26.89)     Time: 1217-1228 PT Time Calculation (min) (ACUTE ONLY): 11 min  Charges:  $  Gait Training: 8-22 mins                    G Codes:  Functional Assessment Tool Used: AM-PAC 6 Clicks Basic Mobility;Clinical judgement Functional Limitation: Mobility: Walking and moving around Mobility: Walking and Moving Around Current Status (K4818): At least 1 percent but less than 20 percent impaired, limited or restricted Mobility: Walking and Moving Around Goal Status [phone]): 0 percent impaired, limited or restricted    [facility], PT, DPT [place] 03/03/2017, 12:51 PM

## 2017-03-03 NOTE — Progress Notes (Signed)
Advanced Heart Failure Rounding Note   Subjective:   Yesterday diuretics and losartan held. Creatinine trending up 1.9>2.1  Dnies SOB/CP.    Objective:   Weight Range:  Vital Signs:   Temp:  [97.9 F (36.6 C)-98.2 F (36.8 C)] 98.2 F (36.8 C) (04/20 0600) Pulse Rate:  [52-62] 62 (04/20 0600) Resp:  [16-20] 20 (04/20 0600) BP: (116-151)/(54-71) 151/71 (04/20 0600) SpO2:  [93 %-99 %] 95 % (04/20 0600) Weight:  [206 lb 11.2 oz (93.8 kg)] 206 lb 11.2 oz (93.8 kg) (04/20 0600) Last BM Date: 03/02/17  Weight change: Filed Weights   03/01/17 0429 03/02/17 0532 03/03/17 0600  Weight: 210 lb 4.8 oz (95.4 kg) 207 lb 3.2 oz (94 kg) 206 lb 11.2 oz (93.8 kg)    Intake/Output:   Intake/Output Summary (Last 24 hours) at 03/03/17 0726 Last data filed at 03/03/17 0710  Gross per 24 hour  Intake             1560 ml  Output              800 ml  Net              760 ml     Physical Exam: General:  Elderly. Pale. No resp difficulty. In bed  HEENT: normal Neck: supple. JVP 6-7 . Carotids 2+ bilat; no bruits. No lymphadenopathy or thryomegaly appreciated. Cor: PMI nondisplaced. Regular rate & rhythm. No rubs, gallops or murmurs. Lungs: clear on room air.  Abdomen: soft, nontender, nondistended. No hepatosplenomegaly. No bruits or masses. Good bowel sounds. Extremities: no cyanosis, clubbing, rash, edema Neuro: alert & orientedx3, cranial nerves grossly intact. moves all 4 extremities w/o difficulty. Affect pleasant  Telemetry: Sinus [place] upper 40s.   Labs: Basic Metabolic Panel:  Recent Labs Lab 02/28/17 1629 03/01/17 0312 03/02/17 0324 03/03/17 0408  NA 140 141 138 137  K 3.7 3.7 3.8 3.9  CL 108 108 105 98*  CO2 22 25 25 25   GLUCOSE 133* 152* 171* 121*  BUN 22* 25* 29* 33*  CREATININE 1.57* 1.79* 1.86* 2.12*  CALCIUM 9.2 8.9 8.9 9.1    Liver Function Tests: No results for input(s): AST, ALT, ALKPHOS, BILITOT, PROT, ALBUMIN in the last 168 hours. No results  for input(s): LIPASE, AMYLASE in the last 168 hours. No results for input(s): AMMONIA in the last 168 hours.  CBC:  Recent Labs Lab 02/28/17 1629 03/01/17 0932 03/02/17 0324  WBC 7.9 6.7 6.7  HGB 9.8* 9.4* 9.3*  HCT 31.8* 29.4* 30.2*  MCV 86.4 87.0 85.6  PLT 211 198 209    Cardiac Enzymes:  Recent Labs Lab 02/28/17 2202 03/01/17 0312 03/01/17 0932  TROPONINI 0.05* 0.05* 0.04*    BNP: BNP (last 3 results)  Recent Labs  02/28/17 1629  BNP 1,488.1*    ProBNP (last 3 results) No results for input(s): PROBNP in the last 8760 hours.    Other results:  Imaging:  No results found.   Medications:     Scheduled Medications: . amiodarone  100 mg Oral Daily  . ferrous sulfate  325 mg Oral TID WC  . hydrALAZINE  12.5 mg Oral Q8H  . insulin aspart  0-15 Units Subcutaneous TID WC  . insulin glargine  30 Units Subcutaneous QHS  . isosorbide mononitrate  15 mg Oral Daily  . polyethylene glycol  17 g Oral Daily  . pravastatin  40 mg Oral QHS  . Rivaroxaban  15 mg Oral QHS  . sodium  chloride flush  3 mL Intravenous Q12H  . venlafaxine  75 mg Oral Once     Infusions: . sodium chloride       PRN Medications:  sodium chloride, acetaminophen, albuterol, benzonatate, diphenhydrAMINE, ondansetron (ZOFRAN) IV, sodium chloride flush   Assessment/Plan/Discussion    Shannon Lucas is an 83 year old with a history of PAF, MI, DMII, PAF, and CKD Stage III admitted with acute HF exacerbation.   1. Acute Systolic Heart Failure: NYHA IIIB. Has had MI in the past. Last cath about 15 years ago. ECHO EF 25-30%. Volume status stable. Keep off diuretics.  Creatinine trending up. No arb/dig/spiro.  Increase hydralazine to 25 mg three times a day and increased imdur 30 mg daily.  Plan RHC/LHC on Monday to evaluate cors and hemodynamics 2. AKI /CKD Stage III-Creatinine baseline 1.5. Creatinine trending up to 2.1. Hopefully will start to trend down. BMET in am.  3. PAF-  Maintaining NSR. Heart running in upper 40s. Continue amio 100 mg daily. On Xarelto 15 mg  4. DMII- Per primary team.  5. Anemia- Iron Sats low. Started on iron.   RHC/LHC on Monday at 7:30. Stop xarelto on Saturday.    Length of Stay: 1  Amy Clegg NP-C  03/03/2017, 7:26 AM  Advanced Heart Failure Team Pager [phone] (M-F; [place])  Please contact [facility] for night-coverage after hours (4p -7a ) and weekends on [email or web address]  Patient seen with NP, agree with the above note.  Creatinine up to 2.1.  Would not give contrast with cath today.  Will aim for RHC/LHC Monday, hopefully creatinine will trend down off diuretics, ARB, etc.    Agree with increasing hydralazine/nitrates.   Would hold off on starting Coreg, HR in lower 50s (sinus).   Maintaining NSR.  Stop Xarelto after Saturday for cath.   Loralie Champagne 03/03/2017 1:30 PM

## 2017-03-04 DIAGNOSIS — I5021 Acute systolic (congestive) heart failure: Secondary | ICD-10-CM

## 2017-03-04 DIAGNOSIS — N179 Acute kidney failure, unspecified: Secondary | ICD-10-CM

## 2017-03-04 DIAGNOSIS — E1159 Type 2 diabetes mellitus with other circulatory complications: Secondary | ICD-10-CM

## 2017-03-04 DIAGNOSIS — I480 Paroxysmal atrial fibrillation: Secondary | ICD-10-CM

## 2017-03-04 LAB — CBC
HCT: 33.2 % — ABNORMAL LOW (ref 36.0–46.0)
Hemoglobin: 10.2 g/dL — ABNORMAL LOW (ref 12.0–15.0)
MCH: 26.6 pg (ref 26.0–34.0)
MCHC: 30.7 g/dL (ref 30.0–36.0)
MCV: 86.5 fL (ref 78.0–100.0)
PLATELETS: 243 10*3/uL (ref 150–400)
RBC: 3.84 MIL/uL — AB (ref 3.87–5.11)
RDW: 16.3 % — ABNORMAL HIGH (ref 11.5–15.5)
WBC: 7.8 10*3/uL (ref 4.0–10.5)

## 2017-03-04 LAB — GLUCOSE, CAPILLARY
GLUCOSE-CAPILLARY: 157 mg/dL — AB (ref 65–99)
Glucose-Capillary: 168 mg/dL — ABNORMAL HIGH (ref 65–99)
Glucose-Capillary: 180 mg/dL — ABNORMAL HIGH (ref 65–99)
Glucose-Capillary: 181 mg/dL — ABNORMAL HIGH (ref 65–99)

## 2017-03-04 LAB — BASIC METABOLIC PANEL
Anion gap: 10 (ref 5–15)
BUN: 34 mg/dL — ABNORMAL HIGH (ref 6–20)
CHLORIDE: 101 mmol/L (ref 101–111)
CO2: 25 mmol/L (ref 22–32)
CREATININE: 1.92 mg/dL — AB (ref 0.44–1.00)
Calcium: 8.9 mg/dL (ref 8.9–10.3)
GFR, EST AFRICAN AMERICAN: 27 mL/min — AB (ref >60)
GFR, EST NON AFRICAN AMERICAN: 23 mL/min — AB (ref >60)
Glucose, Bld: 213 mg/dL — ABNORMAL HIGH (ref 65–99)
POTASSIUM: 4.4 mmol/L (ref 3.5–5.1)
SODIUM: 136 mmol/L (ref 135–145)

## 2017-03-04 NOTE — Progress Notes (Signed)
Advanced Heart Failure Rounding Note   Subjective:    Resting comfortably. Diuretics on hold due to AKI.   No CP or SOB. No orthopnea.    Objective:   Weight Range:  Vital Signs:   Temp:  [98.3 F (36.8 C)] 98.3 F (36.8 C) (04/21 0631) Pulse Rate:  [52-86] 86 (04/21 0900) Resp:  [17-18] 17 (04/21 0900) BP: (129-140)/(48-73) 140/73 (04/21 0900) SpO2:  [93 %-100 %] 100 % (04/21 0900) Weight:  [94.2 kg (207 lb 9.6 oz)] 94.2 kg (207 lb 9.6 oz) (04/21 0631) Last BM Date: 03/03/17  Weight change: Filed Weights   03/02/17 0532 03/03/17 0600 03/04/17 0631  Weight: 94 kg (207 lb 3.2 oz) 93.8 kg (206 lb 11.2 oz) 94.2 kg (207 lb 9.6 oz)    Intake/Output:   Intake/Output Summary (Last 24 hours) at 03/04/17 1036 Last data filed at 03/04/17 0900  Gross per 24 hour  Intake              900 ml  Output             1300 ml  Net             -400 ml     Physical Exam: General:  Lying flat in bed. Comfortable HEENT: normal anicteric Neck: supple. JVP flat . Carotids 2+ bilat; no bruits. No lymphadenopathy or thryomegaly appreciated. Cor: PMI nondisplaced. RRR no m/r/g Lungs: clear  Abdomen: soft, nontender, nondistended. No hepatosplenomegaly. No bruits or masses. Good bowel sounds. Extremities: no cyanosis, clubbing, rash, edema Neuro: alert & orientedx3, cranial nerves grossly intact. moves all 4 extremities w/o difficulty. Affect pleasant  Telemetry: Sinus 50-60s. Personally reviewed   Labs: Basic Metabolic Panel:  Recent Labs Lab 02/28/17 1629 03/01/17 0312 03/02/17 0324 03/03/17 0408 03/04/17 0636  NA 140 141 138 137 136  K 3.7 3.7 3.8 3.9 4.4  CL 108 108 105 98* 101  CO2 22 25 25 25 25   GLUCOSE 133* 152* 171* 121* 213*  BUN 22* 25* 29* 33* 34*  CREATININE 1.57* 1.79* 1.86* 2.12* 1.92*  CALCIUM 9.2 8.9 8.9 9.1 8.9    Liver Function Tests: No results for input(s): AST, ALT, ALKPHOS, BILITOT, PROT, ALBUMIN in the last 168 hours. No results for input(s):  LIPASE, AMYLASE in the last 168 hours. No results for input(s): AMMONIA in the last 168 hours.  CBC:  Recent Labs Lab 02/28/17 1629 03/01/17 0932 03/02/17 0324 03/04/17 0636  WBC 7.9 6.7 6.7 7.8  HGB 9.8* 9.4* 9.3* 10.2*  HCT 31.8* 29.4* 30.2* 33.2*  MCV 86.4 87.0 85.6 86.5  PLT 211 198 209 243    Cardiac Enzymes:  Recent Labs Lab 02/28/17 2202 03/01/17 0312 03/01/17 0932  TROPONINI 0.05* 0.05* 0.04*    BNP: BNP (last 3 results)  Recent Labs  02/28/17 1629  BNP 1,488.1*    ProBNP (last 3 results) No results for input(s): PROBNP in the last 8760 hours.    Other results:  Imaging: No results found.   Medications:     Scheduled Medications: . amiodarone  100 mg Oral Daily  . ferrous sulfate  325 mg Oral TID WC  . hydrALAZINE  25 mg Oral Q8H  . insulin aspart  0-15 Units Subcutaneous TID WC  . insulin glargine  30 Units Subcutaneous QHS  . isosorbide mononitrate  30 mg Oral Daily  . polyethylene glycol  17 g Oral BID  . pravastatin  40 mg Oral QHS  . Rivaroxaban  15  mg Oral QHS  . sodium chloride flush  3 mL Intravenous Q12H  . venlafaxine  75 mg Oral Once    Infusions: . sodium chloride      PRN Medications: sodium chloride, acetaminophen, albuterol, benzonatate, diphenhydrAMINE, ondansetron (ZOFRAN) IV, sodium chloride flush   Assessment/Plan/Discussion    Ms Magie Ciampa is an 83 year old with a history of PAF, MI, DMII, PAF, and CKD Stage III admitted with acute HF exacerbation.   1. Acute Systolic Heart Failure: NYHA IIIB. Has had MI in the past. Last cath about 15 years ago. ECHO EF 25-30%. Volume status stable. Holding diuretics with AKI. Will continue to hold  Off arb/dig/spiro due to AKI Continue hydralazine to 25 mg three times a day and iimdur 30 mg daily.  Plan RHC/LHC on Monday to evaluate cors and hemodynamics if creatinine permits  2. AKI /CKD Stage III-Creatinine baseline 1.5. Creatinine trending back down with holding diuretics.  Continue to follow.  3. PAF- Maintaining NSR. Heart running 50-60s. Continue amio 100 mg daily. On Xarelto 15 mg  4. DMII- Per primary team.  5. Anemia- Iron Sats low. Started on iron.   RHC/LHC on Monday at 7:30. Will stop Xarelto today. No need for bridge.    Length of Stay: 2  Glori Bickers MD 03/04/2017, 10:36 AM  Advanced Heart Failure Team Pager [phone] (M-F; [place])  Please contact [facility] for night-coverage after hours (4p -7a ) and weekends on [email or web address]

## 2017-03-04 NOTE — Progress Notes (Signed)
Patient with no complaints or concerns during 7pm - 7am shift. Slept during the night.  Stated that she feels good this morning. No complaints of pain.    Alice Vitelli, RN

## 2017-03-05 DIAGNOSIS — Z992 Dependence on renal dialysis: Secondary | ICD-10-CM

## 2017-03-05 DIAGNOSIS — N186 End stage renal disease: Secondary | ICD-10-CM

## 2017-03-05 LAB — GLUCOSE, CAPILLARY
GLUCOSE-CAPILLARY: 133 mg/dL — AB (ref 65–99)
Glucose-Capillary: 170 mg/dL — ABNORMAL HIGH (ref 65–99)
Glucose-Capillary: 217 mg/dL — ABNORMAL HIGH (ref 65–99)
Glucose-Capillary: 208 mg/dL — ABNORMAL HIGH (ref 65–99)

## 2017-03-05 LAB — BASIC METABOLIC PANEL
ANION GAP: 8 (ref 5–15)
BUN: 31 mg/dL — ABNORMAL HIGH (ref 6–20)
CALCIUM: 9.1 mg/dL (ref 8.9–10.3)
CO2: 24 mmol/L (ref 22–32)
CREATININE: 1.76 mg/dL — AB (ref 0.44–1.00)
Chloride: 108 mmol/L (ref 101–111)
GFR, EST AFRICAN AMERICAN: 30 mL/min — AB (ref >60)
GFR, EST NON AFRICAN AMERICAN: 26 mL/min — AB (ref >60)
Glucose, Bld: 147 mg/dL — ABNORMAL HIGH (ref 65–99)
Potassium: 4.2 mmol/L (ref 3.5–5.1)
SODIUM: 140 mmol/L (ref 135–145)

## 2017-03-05 LAB — PROTIME-INR
INR: 1.54
Prothrombin Time: 18.6 seconds — ABNORMAL HIGH (ref 11.4–15.2)

## 2017-03-05 MED ORDER — SODIUM CHLORIDE 0.9% FLUSH: 3.0000 mL | Freq: Two times a day (BID) | INTRAVENOUS | Status: DC

## 2017-03-05 MED ORDER — SODIUM CHLORIDE 0.9 % IV SOLN
INTRAVENOUS | Status: DC
  Administered 2017-03-06: 05:00:00 via INTRAVENOUS

## 2017-03-05 MED ORDER — SODIUM CHLORIDE 0.9% FLUSH: 3.0000 mL | INTRAVENOUS | Status: DC | PRN

## 2017-03-05 MED ORDER — SODIUM CHLORIDE 0.9% FLUSH
3.0000 mL | Freq: Two times a day (BID) | INTRAVENOUS | Status: DC
  Administered 2017-03-05: 3 mL via INTRAVENOUS

## 2017-03-05 MED ORDER — ASPIRIN 81 MG PO CHEW: 81.0000 mg | CHEWABLE_TABLET | ORAL | Status: DC

## 2017-03-05 MED ORDER — SODIUM CHLORIDE 0.9 % IV SOLN: 250.0000 mL | INTRAVENOUS | Status: DC | PRN

## 2017-03-05 MED ORDER — SODIUM CHLORIDE 0.9 % IV SOLN: INTRAVENOUS | Status: DC

## 2017-03-05 MED ORDER — ASPIRIN 81 MG PO CHEW
81.0000 mg | CHEWABLE_TABLET | ORAL | Status: AC
  Administered 2017-03-06: 81 mg via ORAL
  Filled 2017-03-05: qty 1

## 2017-03-05 NOTE — Progress Notes (Signed)
Advanced Heart Failure Rounding Note   Subjective:    Sitting up in chair eating lunch. No CP or SOB . Renal function improving.    Objective:   Weight Range:  Vital Signs:   Temp:  [97.8 F (36.6 C)-98.4 F (36.9 C)] 98.4 F (36.9 C) (04/22 0554) Pulse Rate:  [63-90] 63 (04/22 0554) Resp:  [16-18] 18 (04/22 0554) BP: (128-149)/(53-54) 149/53 (04/22 0554) SpO2:  [98 %-100 %] 98 % (04/22 0554) Weight:  [93.5 kg (206 lb 3.2 oz)] 93.5 kg (206 lb 3.2 oz) (04/22 0554) Last BM Date: 03/04/17  Weight change: Filed Weights   03/03/17 0600 03/04/17 0631 03/05/17 0554  Weight: 93.8 kg (206 lb 11.2 oz) 94.2 kg (207 lb 9.6 oz) 93.5 kg (206 lb 3.2 oz)    Intake/Output:   Intake/Output Summary (Last 24 hours) at 03/05/17 1349 Last data filed at 03/05/17 1010  Gross per 24 hour  Intake             1135 ml  Output             2400 ml  Net            -1265 ml     Physical Exam: General:  Sitting in chair eating breakfast  HEENT: normal Neck: supple. no JVD. Carotids 2+ bilat; no bruits. No lymphadenopathy or thryomegaly appreciated. Cor: PMI nondisplaced. Regular rate & rhythm. No rubs, gallops or murmurs. Lungs: clear Abdomen: soft, nontender, nondistended. No hepatosplenomegaly. No bruits or masses. Good bowel sounds. Extremities: no cyanosis, clubbing, rash, edema Neuro: alert & orientedx3, cranial nerves grossly intact. moves all 4 extremities w/o difficulty. Affect pleasant   Telemetry: Sinus 60s. Personally reviewed     Labs: Basic Metabolic Panel:  Recent Labs Lab 03/01/17 0312 03/02/17 0324 03/03/17 0408 03/04/17 0636 03/05/17 0514  NA 141 138 137 136 140  K 3.7 3.8 3.9 4.4 4.2  CL 108 105 98* 101 108  CO2 25 25 25 25 24   GLUCOSE 152* 171* 121* 213* 147*  BUN 25* 29* 33* 34* 31*  CREATININE 1.79* 1.86* 2.12* 1.92* 1.76*  CALCIUM 8.9 8.9 9.1 8.9 9.1    Liver Function Tests: No results for input(s): AST, ALT, ALKPHOS, BILITOT, PROT, ALBUMIN in  the last 168 hours. No results for input(s): LIPASE, AMYLASE in the last 168 hours. No results for input(s): AMMONIA in the last 168 hours.  CBC:  Recent Labs Lab 02/28/17 1629 03/01/17 0932 03/02/17 0324 03/04/17 0636  WBC 7.9 6.7 6.7 7.8  HGB 9.8* 9.4* 9.3* 10.2*  HCT 31.8* 29.4* 30.2* 33.2*  MCV 86.4 87.0 85.6 86.5  PLT 211 198 209 243    Cardiac Enzymes:  Recent Labs Lab 02/28/17 2202 03/01/17 0312 03/01/17 0932  TROPONINI 0.05* 0.05* 0.04*    BNP: BNP (last 3 results)  Recent Labs  02/28/17 1629  BNP 1,488.1*    ProBNP (last 3 results) No results for input(s): PROBNP in the last 8760 hours.    Other results:  Imaging: No results found.   Medications:     Scheduled Medications: . amiodarone  100 mg Oral Daily  . ferrous sulfate  325 mg Oral TID WC  . hydrALAZINE  25 mg Oral Q8H  . insulin aspart  0-15 Units Subcutaneous TID WC  . insulin glargine  30 Units Subcutaneous QHS  . isosorbide mononitrate  30 mg Oral Daily  . polyethylene glycol  17 g Oral BID  . pravastatin  40 mg Oral  QHS  . sodium chloride flush  3 mL Intravenous Q12H  . venlafaxine  75 mg Oral Once    Infusions: . sodium chloride      PRN Medications: sodium chloride, acetaminophen, albuterol, benzonatate, diphenhydrAMINE, ondansetron (ZOFRAN) IV, sodium chloride flush   Assessment/Plan/Discussion    Shannon Lucas is an 83 year old with a history of PAF, MI, DMII, PAF, and CKD Stage III admitted with acute HF exacerbation.   1. Acute Systolic Heart Failure: NYHA IIIB. Has had MI in the past. Last cath about 15 years ago. ECHO EF 25-30%. -Diuretics on held due to AKI. Volume status looks ok. No CP.  - Off arb/dig/spiro due to AKI -Continue hydralazine to 25 mg three times a day and iimdur 30 mg daily.  -Plan RHC/LHC tomorrow to evaluate cors and hemodynamics. We discussed the procedure. Orders written  2. AKI /CKD Stage III-Creatinine baseline 1.5.  -Creatinine continue  to trend back down with holding diuretics. [place] for cath  3. PAF- Maintaining NSR. Heart running 50-60s. Continue amio 100 mg daily. On Xarelto 15 mg  4. DMII- Per primary team.  5. Anemia- Iron Sats low. Started on iron.   RHC/LHC tomorrow at 7:30a. Xarelto held yesterday. No need for bridge.    Length of Stay: 3  Saket Hellstrom MD 03/05/2017, 1:49 PM  Advanced Heart Failure Team Pager [phone] (M-F; [place])  Please contact [facility] for night-coverage after hours (4p -7a ) and weekends on [email or web address]

## 2017-03-05 NOTE — Progress Notes (Signed)
Family Medicine Teaching Service Daily Progress Note Intern Pager: [phone]  Patient name: Shannon Lucas Medical record number: [id number] Date of birth: 11/14/1931 Age: 83 y.o. Gender: female  Primary Care Provider: Manfred Shirts, PA Consultants: None Code Status: Full   Assessment and Plan: 83 y.o. female presenting with dyspnea, cough, increasing edema. PMH is significant for paroxysmal atrial fibrillation on Xarelto, CKD stage 3, type 2 diabetes, GERD, hyperlipidemia, stroke, ? MI, anemia, CHF  HFrEF exacerbation: Output of 3.46L since admission and 1.94L in last 24hr. Last weight 206 lbs,l which is her dry weight.  Echo with EF 25-30% and moderate pulmonary HTN.  - Strict I's and O's, Daily weights - NPO at midnight. Restart diet 4/23 following RHC/LHC - Continue hydralazine 25mg  TID and imdur 30mg  qd.  - Xarelto held 4/22 for anticipated RHC/LHC on 4/23.  - Continue home amiodarone 100 mg daily. - Holding lasix due to AKI - Holding beta blocker per Cardiology - HF team following, appreciate recommendations. - PT/OT   AKI on CKD stage III. Creatinine 1.76 (improved from 1.92). Baseline creatinine 1.49-1.6 per care everywhere.  - Hold diuretics and home cozaar - Avoid nephrotoxic agents.  Paroxysmal atrial fibrillation: On Xarelto chronically. Currently in sinus rhythm. - Continue home amiodarone as above - Holding home Xarelto for LHC/RHC. Restart when able.  Prolonged QTc: QTc of 499 on admission increased to QTc 536 on repeat EKG - Avoid QT prolonging medications - Monitor electrolytes  Type 2 diabetes: Hemoglobin A1c 6.9. At home on Lantus 57 units daily at bedtime. CBG 121 this AM.  - Continue Lantus 30 units daily at bedtime -> consider stopping/decreasing Lantus Monday as will be NPO for cath  - Moderate sliding scale insulin - CBGs with meals and at bedtime   Anemia: Hemoglobin improving. Anemia panel c/w iron deficiency. - Consider FOBT as an outpatient -  Continue daily iron supplementation - Miralax daily to avoid constipation  FEN/GI: Saline lock IV, carb modified/heart healthy diet Prophylaxis: Xarelto  Disposition: pending medical management given significantly low systolic function with fluid overload  Subjective:  Denies any acute concerns. Shortness of breath improved. Family present in room.  Objective: Temp:  [97.8 F (36.6 C)-98.4 F (36.9 C)] 98.4 F (36.9 C) (04/22 0554) Pulse Rate:  [54-90] 63 (04/22 0554) Resp:  [16-20] 18 (04/22 0554) BP: (128-149)/(41-73) 149/53 (04/22 0554) SpO2:  [94 %-100 %] 98 % (04/22 0554) Weight:  [206 lb 3.2 oz (93.5 kg)] 206 lb 3.2 oz (93.5 kg) (04/22 0554) Physical Exam: General: Elderly female in no acute distress, sitting in bed conversing with family Cardiovascular: RRR, no murmurs Respiratory: Normal effort on room air. CTAB Gastrointestinal: Soft, nondistended, nontender, normal bowel sounds MSK: Moves all extremities spontaneously, full range of motion of all joints, no edema  Laboratory:  Recent Labs Lab 03/01/17 0932 03/02/17 0324 03/04/17 0636  WBC 6.7 6.7 7.8  HGB 9.4* 9.3* 10.2*  HCT 29.4* 30.2* 33.2*  PLT 198 209 243    Recent Labs Lab 03/03/17 0408 03/04/17 0636 03/05/17 0514  NA 137 136 140  K 3.9 4.4 4.2  CL 98* 101 108  CO2 25 25 24   BUN 33* 34* 31*  CREATININE 2.12* 1.92* 1.76*  CALCIUM 9.1 8.9 9.1  GLUCOSE 121* 213* 147*    Iron/TIBC/Ferritin/ %Sat    Component Value Date/Time   IRON 19 (L) 02/28/2017 2202   TIBC 381 02/28/2017 2202   FERRITIN 34 02/28/2017 2202   IRONPCTSAT 5 (L) 02/28/2017 2202  Lipid panel per care everywhere Chol 149 TG Bailey, DO 03/05/2017, 7:23 AM PGY-3, [place] Intern pager: [phone], text pages welcome

## 2017-03-06 ENCOUNTER — Ambulatory Visit (HOSPITAL_COMMUNITY): Admit: 2017-03-06 | Payer: Medicare Other | Admitting: Cardiology

## 2017-03-06 ENCOUNTER — Encounter (HOSPITAL_COMMUNITY)
Admission: EM | Disposition: A | Discharge status: Home / Self Care | Payer: Self-pay | Source: Home / Self Care | Attending: Family Medicine

## 2017-03-06 ENCOUNTER — Encounter (HOSPITAL_COMMUNITY): Payer: Self-pay | Admitting: Cardiology

## 2017-03-06 DIAGNOSIS — I2510 Atherosclerotic heart disease of native coronary artery without angina pectoris: Secondary | ICD-10-CM

## 2017-03-06 DIAGNOSIS — I509 Heart failure, unspecified: Secondary | ICD-10-CM

## 2017-03-06 HISTORY — PX: RIGHT/LEFT HEART CATH AND CORONARY ANGIOGRAPHY: CATH118266

## 2017-03-06 LAB — POCT I-STAT 3, VENOUS BLOOD GAS (G3P V)
ACID-BASE DEFICIT: 2 mmol/L (ref 0.0–2.0)
ACID-BASE DEFICIT: 4 mmol/L — AB (ref 0.0–2.0)
BICARBONATE: 21.2 mmol/L (ref 20.0–28.0)
BICARBONATE: 22.9 mmol/L (ref 20.0–28.0)
O2 SAT: 69 %
O2 SAT: 70 %
PH VEN: 7.36 (ref 7.250–7.430)
PO2 VEN: 38 mmHg (ref 32.0–45.0)
TCO2: 22 mmol/L (ref 0–100)
TCO2: 24 mmol/L (ref 0–100)
pCO2, Ven: 38.2 mmHg — ABNORMAL LOW (ref 44.0–60.0)
pCO2, Ven: 40.5 mmHg — ABNORMAL LOW (ref 44.0–60.0)
pH, Ven: 7.352 (ref 7.250–7.430)
pO2, Ven: 38 mmHg (ref 32.0–45.0)

## 2017-03-06 LAB — CBC
HCT: 31.7 % — ABNORMAL LOW (ref 36.0–46.0)
HEMATOCRIT: 30.6 % — AB (ref 36.0–46.0)
HEMOGLOBIN: 9.4 g/dL — AB (ref 12.0–15.0)
HEMOGLOBIN: 9.9 g/dL — AB (ref 12.0–15.0)
MCH: 26.7 pg (ref 26.0–34.0)
MCH: 27.4 pg (ref 26.0–34.0)
MCHC: 30.7 g/dL (ref 30.0–36.0)
MCHC: 31.2 g/dL (ref 30.0–36.0)
MCV: 86.9 fL (ref 78.0–100.0)
MCV: 87.8 fL (ref 78.0–100.0)
PLATELETS: 241 10*3/uL (ref 150–400)
Platelets: 220 10*3/uL (ref 150–400)
RBC: 3.52 MIL/uL — AB (ref 3.87–5.11)
RBC: 3.61 MIL/uL — ABNORMAL LOW (ref 3.87–5.11)
RDW: 17.2 % — ABNORMAL HIGH (ref 11.5–15.5)
RDW: 17.5 % — ABNORMAL HIGH (ref 11.5–15.5)
WBC: 7.5 10*3/uL (ref 4.0–10.5)
WBC: 7.3 10*3/uL (ref 4.0–10.5)

## 2017-03-06 LAB — CREATININE, SERUM
CREATININE: 1.7 mg/dL — AB (ref 0.44–1.00)
GFR calc Af Amer: 31 mL/min — ABNORMAL LOW (ref >60)
GFR, EST NON AFRICAN AMERICAN: 27 mL/min — AB (ref >60)

## 2017-03-06 LAB — BASIC METABOLIC PANEL
Anion gap: 8 (ref 5–15)
BUN: 29 mg/dL — ABNORMAL HIGH (ref 6–20)
CHLORIDE: 110 mmol/L (ref 101–111)
CO2: 23 mmol/L (ref 22–32)
CREATININE: 1.64 mg/dL — AB (ref 0.44–1.00)
Calcium: 9.1 mg/dL (ref 8.9–10.3)
GFR calc non Af Amer: 28 mL/min — ABNORMAL LOW (ref >60)
GFR, EST AFRICAN AMERICAN: 32 mL/min — AB (ref >60)
Glucose, Bld: 115 mg/dL — ABNORMAL HIGH (ref 65–99)
POTASSIUM: 4.1 mmol/L (ref 3.5–5.1)
SODIUM: 141 mmol/L (ref 135–145)

## 2017-03-06 LAB — GLUCOSE, CAPILLARY
GLUCOSE-CAPILLARY: 205 mg/dL — AB (ref 65–99)
Glucose-Capillary: 158 mg/dL — ABNORMAL HIGH (ref 65–99)
Glucose-Capillary: 205 mg/dL — ABNORMAL HIGH (ref 65–99)

## 2017-03-06 SURGERY — RIGHT/LEFT HEART CATH AND CORONARY ANGIOGRAPHY
Anesthesia: LOCAL

## 2017-03-06 MED ORDER — LIDOCAINE HCL (PF) 1 % IJ SOLN
INTRAMUSCULAR | Status: DC | PRN
  Administered 2017-03-06: 5 mL via SUBCUTANEOUS

## 2017-03-06 MED ORDER — HEPARIN SODIUM (PORCINE) 5000 UNIT/ML IJ SOLN: 5000.0000 [IU] | Freq: Three times a day (TID) | INTRAMUSCULAR | Status: DC

## 2017-03-06 MED ORDER — FUROSEMIDE 40 MG PO TABS: 40.0000 mg | ORAL_TABLET | Freq: Every day | ORAL | Status: DC

## 2017-03-06 MED ORDER — SODIUM CHLORIDE 0.9 % IV SOLN: 250.0000 mL | INTRAVENOUS | Status: DC | PRN

## 2017-03-06 MED ORDER — HEPARIN SODIUM (PORCINE) 1000 UNIT/ML IJ SOLN
INTRAMUSCULAR | Status: AC
  Filled 2017-03-06: qty 1

## 2017-03-06 MED ORDER — RIVAROXABAN 15 MG PO TABS: 15.0000 mg | ORAL_TABLET | Freq: Every day | ORAL | Status: DC

## 2017-03-06 MED ORDER — MIDAZOLAM HCL 2 MG/2ML IJ SOLN
INTRAMUSCULAR | Status: DC | PRN
  Administered 2017-03-06: 1 mg via INTRAVENOUS

## 2017-03-06 MED ORDER — FUROSEMIDE 10 MG/ML IJ SOLN
40.0000 mg | Freq: Once | INTRAMUSCULAR | Status: AC
  Administered 2017-03-06: 40 mg via INTRAVENOUS
  Filled 2017-03-06: qty 4

## 2017-03-06 MED ORDER — HEPARIN SODIUM (PORCINE) 1000 UNIT/ML IJ SOLN
INTRAMUSCULAR | Status: DC | PRN
  Administered 2017-03-06: 5000 [IU] via INTRAVENOUS

## 2017-03-06 MED ORDER — IOPAMIDOL (ISOVUE-370) INJECTION 76%
INTRAVENOUS | Status: AC
  Filled 2017-03-06: qty 100

## 2017-03-06 MED ORDER — SODIUM CHLORIDE 0.9% FLUSH: 3.0000 mL | INTRAVENOUS | Status: DC | PRN

## 2017-03-06 MED ORDER — SODIUM CHLORIDE 0.9% FLUSH
3.0000 mL | Freq: Two times a day (BID) | INTRAVENOUS | Status: DC
  Administered 2017-03-06 – 2017-03-07 (×2): 3 mL via INTRAVENOUS

## 2017-03-06 MED ORDER — VERAPAMIL HCL 2.5 MG/ML IV SOLN
INTRAVENOUS | Status: AC
  Filled 2017-03-06: qty 2

## 2017-03-06 MED ORDER — VERAPAMIL HCL 2.5 MG/ML IV SOLN
INTRAVENOUS | Status: DC | PRN
  Administered 2017-03-06: 10 mL via INTRA_ARTERIAL

## 2017-03-06 MED ORDER — FENTANYL CITRATE (PF) 100 MCG/2ML IJ SOLN
INTRAMUSCULAR | Status: DC | PRN
  Administered 2017-03-06 (×2): 25 ug via INTRAVENOUS

## 2017-03-06 MED ORDER — MIDAZOLAM HCL 2 MG/2ML IJ SOLN
INTRAMUSCULAR | Status: AC
  Filled 2017-03-06: qty 2

## 2017-03-06 MED ORDER — HEPARIN (PORCINE) IN NACL 2-0.9 UNIT/ML-% IJ SOLN
INTRAMUSCULAR | Status: AC
  Filled 2017-03-06: qty 1000

## 2017-03-06 MED ORDER — LIDOCAINE HCL 1 % IJ SOLN
INTRAMUSCULAR | Status: AC
  Filled 2017-03-06: qty 20

## 2017-03-06 MED ORDER — SPIRONOLACTONE 25 MG PO TABS
12.5000 mg | ORAL_TABLET | Freq: Every day | ORAL | Status: DC
  Administered 2017-03-06 – 2017-03-07 (×2): 12.5 mg via ORAL
  Filled 2017-03-06 (×2): qty 1

## 2017-03-06 MED ORDER — FENTANYL CITRATE (PF) 100 MCG/2ML IJ SOLN
INTRAMUSCULAR | Status: AC
  Filled 2017-03-06: qty 2

## 2017-03-06 MED ORDER — ACETAMINOPHEN 325 MG PO TABS
650.0000 mg | ORAL_TABLET | ORAL | Status: DC | PRN
  Administered 2017-03-06 – 2017-03-07 (×2): 650 mg via ORAL
  Filled 2017-03-06: qty 2

## 2017-03-06 MED ORDER — HEPARIN (PORCINE) IN NACL 2-0.9 UNIT/ML-% IJ SOLN
INTRAMUSCULAR | Status: DC | PRN
  Administered 2017-03-06: 1000 mL

## 2017-03-06 MED ORDER — RIVAROXABAN 15 MG PO TABS
15.0000 mg | ORAL_TABLET | Freq: Every day | ORAL | Status: DC
  Administered 2017-03-06: 15 mg via ORAL
  Filled 2017-03-06: qty 1

## 2017-03-06 MED ORDER — ONDANSETRON HCL 4 MG/2ML IJ SOLN: 4.0000 mg | Freq: Four times a day (QID) | INTRAMUSCULAR | Status: DC | PRN

## 2017-03-06 SURGICAL SUPPLY — 14 items
CATH 5FR JL3.5 JR4 ANG PIG MP (CATHETERS) ×2 IMPLANT
CATH BALLN WEDGE 5F 110CM (CATHETERS) ×2 IMPLANT
COVER PRB 48X5XTLSCP FOLD TPE (BAG) ×1 IMPLANT
COVER PROBE 5X48 (BAG) ×1
DEVICE RAD COMP TR BAND LRG (VASCULAR PRODUCTS) ×2 IMPLANT
GLIDESHEATH SLEND SS 6F .021 (SHEATH) ×2 IMPLANT
GUIDEWIRE INQWIRE 1.5J.035X260 (WIRE) ×1 IMPLANT
INQWIRE 1.5J .035X260CM (WIRE) ×2
KIT HEART LEFT (KITS) ×2 IMPLANT
PACK CARDIAC CATHETERIZATION (CUSTOM PROCEDURE TRAY) ×2 IMPLANT
SHEATH GLIDE SLENDER 4/5FR (SHEATH) ×2 IMPLANT
TRANSDUCER W/STOPCOCK (MISCELLANEOUS) ×2 IMPLANT
TUBING CIL FLEX 10 FLL-RA (TUBING) ×2 IMPLANT
WIRE HI TORQ VERSACORE-J 145CM (WIRE) ×2 IMPLANT

## 2017-03-06 NOTE — Progress Notes (Signed)
Physical Therapy Treatment Patient Details Name: Shannon Lucas MRN: [id number] DOB: 1931/01/29 Today's Date: 03/06/2017    History of Present Illness Pt is an 83 y/o female admitted secondary to dyspnea and bilateral LE edema found to have an acute CHF exacerbation. PMH including but not limited to a-fib, CHF, DM, HTN and CKD.    PT Comments    Patient is progressing well toward mobility goals. Current plan remains appropriate.    Follow Up Recommendations  No PT follow up     Equipment Recommendations  None recommended by PT    Recommendations for Other Services       Precautions / Restrictions Precautions Precautions: Fall Restrictions Weight Bearing Restrictions: No    Mobility  Bed Mobility Overal bed mobility: Modified Independent             General bed mobility comments: increased time  Transfers Overall transfer level: Needs assistance Equipment used: Rolling walker (2 wheeled) Transfers: Sit to/from Stand Sit to Stand: Supervision         General transfer comment: increased time, supervision for safety  Ambulation/Gait Ambulation/Gait assistance: Supervision Ambulation Distance (Feet): 120 Feet Assistive device: Rolling walker (2 wheeled) Gait Pattern/deviations: Step-through pattern     General Gait Details: slow, steady gait   Stairs Stairs: Yes   Stair Management: One rail Left;Step to pattern;Forwards;Sideways Number of Stairs: 4 General stair comments: cues for sequencing and technique; descended sideways to use L UE   Wheelchair Mobility    Modified Rankin (Stroke Patients Only)       Balance Overall balance assessment: Needs assistance Sitting-balance support: Feet supported Sitting balance-Leahy Scale: Good     Standing balance support: During functional activity;No upper extremity supported Standing balance-Leahy Scale: Fair                              Cognition Arousal/Alertness: Awake/alert Behavior  During Therapy: WFL for tasks assessed/performed Overall Cognitive Status: Within Functional Limits for tasks assessed                                        Exercises      General Comments        Pertinent Vitals/Pain Pain Assessment: Faces Faces Pain Scale: Hurts a little bit Pain Location: R UE Pain Descriptors / Indicators: Sore Pain Intervention(s): Limited activity within patient's tolerance;Monitored during session;Repositioned    Home Living                      Prior Function            PT Goals (current goals can now be found in the care plan section) Acute Rehab PT Goals PT Goal Formulation: With patient Time For Goal Achievement: 03/15/17 Potential to Achieve Goals: Good Progress towards PT goals: Progressing toward goals    Frequency    Min 3X/week      PT Plan Current plan remains appropriate    Co-evaluation             End of Session Equipment Utilized During Treatment: Gait belt Activity Tolerance: Patient tolerated treatment well Patient left: in chair;with call bell/phone within reach Nurse Communication: Mobility status PT Visit Diagnosis: Other abnormalities of gait and mobility (R26.89)     Time: 1545-1610 PT Time Calculation (min) (ACUTE ONLY): 25 min  Charges:  $Gait  Training: 8-22 mins $Therapeutic Activity: 8-22 mins                    G Codes:       Earney Navy, PTA Pager: [phone]     Darliss Cheney 03/06/2017, 4:41 PM

## 2017-03-06 NOTE — Progress Notes (Signed)
PT Cancellation Note  Patient Details Name: Shannon Lucas MRN: [id number] DOB: 1931-04-20   Cancelled Treatment:    Reason Eval/Treat Not Completed: Patient not medically ready PT will check on pt later as time allows.    Salina April, PTA Pager: [phone]   03/06/2017, 2:08 PM

## 2017-03-06 NOTE — Discharge Summary (Signed)
[facility] Discharge Summary  Patient name: Shannon Lucas Medical record number: [id number] Date of birth: Oct 05, 1931 Age: 83 y.o. Gender: female Date of Admission: 02/28/2017  Date of Discharge: 03/07/17 Admitting Physician: Lupita Dawn, MD  Primary Care Provider: Manfred Shirts, PA Consultants: Heart failure  Indication for Hospitalization: CHF exacerbation   Discharge Diagnoses/Problem List:  HFrEF exacerbation AKI on CKD III Paroxysmal Afib Prolonged QTc R radial cath site hematoma Type 2 diabetes Iron deficiency anemia  Disposition: Home  Discharge Condition: Stable, improved   Discharge Exam:  Temp:  [97.5 F (36.4 C)-98.7 F (37.1 C)] 97.7 F (36.5 C) (04/24 0754) Pulse Rate:  [48-96] 61 (04/24 0754) Resp:  [18-20] 18 (04/24 0754) BP: (126-166)/(46-102) 158/60 (04/24 0754) SpO2:  [94 %-100 %] 100 % (04/24 0754) Weight:  [205 lb 6.4 oz (93.2 kg)] 205 lb 6.4 oz (93.2 kg) (04/24 0557) General: Elderly female in no acute distress, sitting in chair comfortably Cardiovascular: RRR, no murmurs Respiratory: Normal effort on room air. CTAB Gastrointestinal: Soft, nondistended, nontender, normal bowel sounds MSK: Moves all extremities spontaneously, full range of motion of all joints Extremities: no edema. Warm and well perfused. R wrist with 1 inch x 1 inch area of healing ecchymosis that has expanded minimally beyond markings.  Brief Hospital Course:  Shannon Lucas is a 83 y.o. female with PMH significant for paroxysmal atrial fibrillation on Xarelto, CKD stage 3, type 2 diabetes, GERD, hyperlipidemia, stroke, "mild" MI per patient, anemia, CHF who presented with dyspnea, cough, increasing edema 1 week. She was admitted and started on IV diuresis with lasix. Echocardiogram was obtained who showed a EF 25-30% and moderate pulmonary HTN. Heart failure team was consulted given significantly low systolic function and performed a RHC/LHC that showed no  significant disease. Patient at discharge was on the following medications: hydralazine 25mg  TID,  imdur 30mg  qd, amiodarone 100 mg qd, spironolactone 12.5mg  qd. No beta blocker was started d/t low HR. Patient was instructed to start po lasix 40mg  qd the day after discharge because she had a mild increase in her creatinine to 1.95 up from 1.7 (baseline 1.49-1.6) likely from the cath procedure. Therefore her home cozaar was also held and will need to be restarted as tolerated. She was set up for close monitoring of her electrolytes and creatinine on Friday 4/27 and a follow up with Heart Failure clinic in 10 days. Of note, patient had a prolonged QTc on multiple EKGs throughout her admission, on day of discharge it was 507.  Issues for Follow Up:  1. Please follow up on electrolytes and creatinine recheck on Friday 03/10/17 via Heart Failure clinic. 2. Patient has Heart Failure clinic follow up as per below. 3. Please avoid QTc prolonging medications  Significant Procedures:  RHC/LHC on 03/06/17 Coronary Findings Dominance: Co-dominant  Left Main  Separate ostia LAD and LCx.  Left Anterior Descending  Luminal irregularities in the LAD. Small to moderate D1 with 70% ostial stenosis. Small to moderate D2 with 50% ostial stenosis.  Left Circumflex  Large caliber vessel. Small to moderate OM1 with 40% ostial stenosis.  Right Coronary Artery  No significant disease. Relatively small vessel.     Significant Labs and Imaging:   Recent Labs Lab 03/04/17 0636 03/06/17 0519 03/06/17 0939  WBC 7.8 7.5 7.3  HGB 10.2* 9.4* 9.9*  HCT 33.2* 30.6* 31.7*  PLT 243 241 220    Recent Labs Lab 03/03/17 0408 03/04/17 0636 03/05/17 4098 03/06/17 0519 03/06/17 0939 03/07/17  0220  NA 137 136 140 141  --  138  K 3.9 4.4 4.2 4.1  --  4.7  CL 98* 101 108 110  --  104  CO2 25 25 24 23   --  25  GLUCOSE 121* 213* 147* 115*  --  190*  BUN 33* 34* 31* 29*  --  34*  CREATININE 2.12* 1.92* 1.76* 1.64*  1.70* 1.95*  CALCIUM 9.1 8.9 9.1 9.1  --  9.0     Results/Tests Pending at Time of Discharge: none  Discharge Medications:  Allergies as of 03/07/2017   No Known Allergies     Medication List    STOP taking these medications   diltiazem 180 MG 24 hr capsule Commonly known as:  CARDIZEM CD     TAKE these medications   amiodarone 100 MG tablet Commonly known as:  PACERONE Take 1 tablet (100 mg total) by mouth daily. Start taking on:  03/08/2017 What changed:  medication strength   ferrous sulfate 325 (65 FE) MG tablet Take 1 tablet (325 mg total) by mouth 3 (three) times daily with meals.   furosemide 40 MG tablet Commonly known as:  LASIX Take 1 tablet (40 mg total) by mouth daily. Start taking on:  03/08/2017   hydrALAZINE 25 MG tablet Commonly known as:  APRESOLINE Take 1.5 tablets (37.5 mg total) by mouth every 8 (eight) hours.   isosorbide mononitrate 30 MG 24 hr tablet Commonly known as:  IMDUR Take 30 mg by mouth daily.   LANTUS SOLOSTAR 100 UNIT/ML Solostar Pen Generic drug:  Insulin Glargine Inject 56 Units into the skin at bedtime.   losartan 100 MG tablet Commonly known as:  COZAAR Take 100 mg by mouth daily.   pravastatin 40 MG tablet Commonly known as:  PRAVACHOL Take 40 mg by mouth daily.   spironolactone 25 MG tablet Commonly known as:  ALDACTONE Take 0.5 tablets (12.5 mg total) by mouth daily. Start taking on:  03/08/2017   venlafaxine 75 MG tablet Commonly known as:  EFFEXOR Take 75 mg by mouth daily.   VENTOLIN HFA 108 (90 Base) MCG/ACT inhaler Generic drug:  albuterol Inhale 2 puffs into the lungs every 6 (six) hours as needed for wheezing or shortness of breath.   XARELTO 15 MG Tabs tablet Generic drug:  Rivaroxaban Take 15 mg by mouth daily.       Discharge Instructions: Please refer to Patient Instructions section of EMR for full details.  Patient was counseled important signs and symptoms that should prompt return to medical  care, changes in medications, dietary instructions, activity restrictions, and follow up appointments.   Follow-Up Appointments: Follow-up Information    [facility] Follow up on 03/15/2017.   Specialty:  Cardiology Why:  at 1030 am for post hospital follow up. The code for parking is 6001. Leisure centre manager through [employer] on [place]. Underground parking on your right.  Can also park in lower ED lot and enter through [employer].  Contact information: [address] [id number] mc [address] [place] [address] [place], [place], [place]. Call in 1 week.   Specialty:  General Practice Why:  Make a hospital follow up appointment with your primary care provider.Patient please call  Contact information: [address] [place] [address] [phone]        Loralie Champagne, MD Follow up on 03/10/2017.   Specialty:  Cardiology Why:  at 2 pm for labs. (  BMET) The code for parking is 6000. Leisure centre manager through [employer] on [place]. Underground parking on your right. Can also park in lower ED lot and enter through [employer]. Contact information: [address] [place] [address] [place], DO 03/07/2017, 6:12 PM PGY-1, [place]

## 2017-03-06 NOTE — Progress Notes (Signed)
TR band removed without complication.  Tegaderm with gauze placed on Right radial site.  Slight pre-existing ecchymosis present following cath and present upon [facility] arrival from have cardiac cath performed.

## 2017-03-06 NOTE — Significant Event (Signed)
Rapid Response Event Note  Overview: Time Called: 2056 Arrival Time: 2058 Event Type: Other (Comment) (hematoma to radial access site)  Initial Focused Assessment: Called by Santiago Glad, RN for concern of hematoma to R radial Cath site.  On arrival, pt's arm elevated with pillow and manual pressure was being held per Cath lab recommendation.    Access site was a level 2 with concern by primary RN that the hematoma was enlarging.   Pulse, motor and sensory remain intact to R arm and R hand.  Cap refill < 3, skin remains warm and dry.  Pt reports no increase in pain to the extremity.    VSS, 96% RA, BP 120/64, HR 51 and irreg.  Resp even and unlabored.     Interventions:  Pressure held for 20 minutes, arm elevated, and site marked.    Plan of Care (if not transferred):   Advised RN to call cath lab back to come assess as needed and to call rapid response team if further assistance needed.     Event Summary: Name of Physician Notified:  (Cath lab notified PTA RRT) at      at    Outcome: Stayed in room and stabalized  Event End Time: 2120  Pam Drown

## 2017-03-06 NOTE — Progress Notes (Signed)
Pt.  post cath today. R radial cath site noted to be swollen, bruised, hematoma >3 cm. Cath lab notified.  Pressure held for 20+ minutes. Rapid RN paged and on the floor to assess, arm elevated. Pain noted at the site, VSS. Pt. In no distress. On call for Cardiology paged to make aware. RN will monitor site and pt. For changes in condition. [name], [name]

## 2017-03-06 NOTE — Interval H&P Note (Signed)
History and Physical Interval Note:  03/06/2017 7:48 AM  Shannon Lucas  has presented today for surgery, with the diagnosis of hf  The various methods of treatment have been discussed with the patient and family. After consideration of risks, benefits and other options for treatment, the patient has consented to  Procedure(s): Right/Left Heart Cath and Coronary Angiography (N/A) as a surgical intervention .  The patient's history has been reviewed, patient examined, no change in status, stable for surgery.  I have reviewed the patient's chart and labs.  Questions were answered to the patient's satisfaction.     Jacky Hartung [employer]

## 2017-03-06 NOTE — Progress Notes (Signed)
Patient ID: Shannon Lucas, female   DOB: [birth year], 83 y.o.   MRN: [id number]    Advanced Heart Failure Rounding Note   Subjective:    No dyspnea or chest pain.  Creatinine down to 1.6.  Cath done today, see below.   Coronary Findings   Dominance: Co-dominant  Left Main  Separate ostia LAD and LCx.  Left Anterior Descending  Luminal irregularities in the LAD. Small to moderate D1 with 70% ostial stenosis. Small to moderate D2 with 50% ostial stenosis.  Left Circumflex  Large caliber vessel. Small to moderate OM1 with 40% ostial stenosis.  Right Coronary Artery  No significant disease. Relatively small vessel.  Right Heart   Right Heart Pressures RHC Procedural Findings: Hemodynamics (mmHg) RA mean 10 RV 74/14 PA 78/28, mean 44 PCWP mean 25 LV 165/25 AO 146/54 Oxygen saturations: PA 70% AO 99% Cardiac Output (Fick) 7.21  Cardiac Index (Fick) 3.59 PVR 2.65 WU         Objective:   Weight Range:  Vital Signs:   Temp:  [97.5 F (36.4 C)-98.6 F (37 C)] 97.5 F (36.4 C) (04/23 0541) Pulse Rate:  [51-72] 68 (04/23 0917) Resp:  [10-23] 10 (04/23 0851) BP: (135-163)/(41-74) 142/46 (04/23 0917) SpO2:  [95 %-100 %] 100 % (04/23 0851) Weight:  [205 lb 11.2 oz (93.3 kg)] 205 lb 11.2 oz (93.3 kg) (04/23 0541) Last BM Date: 03/05/17  Weight change: Filed Weights   03/04/17 0631 03/05/17 0554 03/06/17 0541  Weight: 207 lb 9.6 oz (94.2 kg) 206 lb 3.2 oz (93.5 kg) 205 lb 11.2 oz (93.3 kg)    Intake/Output:   Intake/Output Summary (Last 24 hours) at 03/06/17 0938 Last data filed at 03/06/17 0542  Gross per 24 hour  Intake              895 ml  Output             1752 ml  Net             -857 ml     Physical Exam: General:  Sitting in chair eating breakfast  HEENT: normal Neck: supple. no JVD. Carotids 2+ bilat; no bruits. No lymphadenopathy or thryomegaly appreciated. Cor: PMI nondisplaced. Regular rate & rhythm. No rubs, gallops or murmurs. Lungs:  clear Abdomen: soft, nontender, nondistended. No hepatosplenomegaly. No bruits or masses. Good bowel sounds. Extremities: no cyanosis, clubbing, rash, edema Neuro: alert & orientedx3, cranial nerves grossly intact. moves all 4 extremities w/o difficulty. Affect pleasant   Telemetry: Sinus 60s. Personally reviewed     Labs: Basic Metabolic Panel:  Recent Labs Lab 03/02/17 0324 03/03/17 0408 03/04/17 0636 03/05/17 0514 03/06/17 0519  NA 138 137 136 140 141  K 3.8 3.9 4.4 4.2 4.1  CL 105 98* 101 108 110  CO2 25 25 25 24 23   GLUCOSE 171* 121* 213* 147* 115*  BUN 29* 33* 34* 31* 29*  CREATININE 1.86* 2.12* 1.92* 1.76* 1.64*  CALCIUM 8.9 9.1 8.9 9.1 9.1    Liver Function Tests: No results for input(s): AST, ALT, ALKPHOS, BILITOT, PROT, ALBUMIN in the last 168 hours. No results for input(s): LIPASE, AMYLASE in the last 168 hours. No results for input(s): AMMONIA in the last 168 hours.  CBC:  Recent Labs Lab 02/28/17 1629 03/01/17 0932 03/02/17 0324 03/04/17 0636 03/06/17 0519  WBC 7.9 6.7 6.7 7.8 7.5  HGB 9.8* 9.4* 9.3* 10.2* 9.4*  HCT 31.8* 29.4* 30.2* 33.2* 30.6*  MCV 86.4 87.0 85.6 86.5 86.9  PLT 211 198 209 243 241    Cardiac Enzymes:  Recent Labs Lab 02/28/17 2202 03/01/17 0312 03/01/17 0932  TROPONINI 0.05* 0.05* 0.04*    BNP: BNP (last 3 results)  Recent Labs  02/28/17 1629  BNP 1,488.1*    ProBNP (last 3 results) No results for input(s): PROBNP in the last 8760 hours.    Other results:  Imaging: No results found.   Medications:     Scheduled Medications: . amiodarone  100 mg Oral Daily  . ferrous sulfate  325 mg Oral TID WC  . furosemide  40 mg Intravenous Once  . [START ON 03/07/2017] furosemide  40 mg Oral Daily  . heparin  5,000 Units Subcutaneous Q8H  . hydrALAZINE  25 mg Oral Q8H  . insulin aspart  0-15 Units Subcutaneous TID WC  . insulin glargine  30 Units Subcutaneous QHS  . isosorbide mononitrate  30 mg Oral Daily   . polyethylene glycol  17 g Oral BID  . pravastatin  40 mg Oral QHS  . sodium chloride flush  3 mL Intravenous Q12H  . sodium chloride flush  3 mL Intravenous Q12H  . spironolactone  12.5 mg Oral Daily  . venlafaxine  75 mg Oral Once    Infusions: . sodium chloride    . sodium chloride      PRN Medications: sodium chloride, sodium chloride, acetaminophen, albuterol, benzonatate, diphenhydrAMINE, ondansetron (ZOFRAN) IV, sodium chloride flush, sodium chloride flush   Assessment/Plan/Discussion    Shannon Lucas is an 83 year old with a history of PAF, MI, DMII, PAF, and CKD Stage III admitted with acute HF exacerbation.   1. Acute Systolic Heart Failure: NYHA IIIB at admission. Has history of remote prior MI. ECHO EF 25-30%.  See cath above, nonischemic cardiomyopathy.  Filling pressures are mildly elevated after holding Lasix pre-cath.  - Will give 1 dose Lasix 40 mg IV today, start po Lasix 40 daily tomorrow.  - Add spironolactone 12.5 mg daily.  - No ACEI/ARB yet with elevated creatinine.  HR in 50s, hold off on Coreg for now.  - Continue hydralazine to 25 mg three times a day and Imdur 30 mg daily.  - She feels good, think she can go home today (will send home on Lasix, had not been on in the past).  2. AKI /CKD Stage III: Creatinine baseline 1.5, now back to baseline.  3. PAF: Maintaining NSR. Heart running 50-60s.  - Continue amio 100 mg daily.  - Restart Xarelto 15 daily.  4. DMII: Per primary team.  5. Anemia: Iron Sats low. Started on iron.  6. Disposition: I think she can go home later today if ok with primary service.  She will need followup for medication titration in CHF clinic, will arrange for 10 days or so.  Cardiac meds for discharge: Xarelto 15 mg daily, Lasix 40 mg po daily, spironolactone 12.5 mg daily, hydralazine 25 mg tid, Imdur 30 daily, amiodarone 100 mg daily.   Length of Stay: 4  Loralie Champagne MD 03/06/2017, 9:38 AM  Advanced Heart Failure Team Pager  [phone] (M-F; 7a - 4p)  Please contact [facility] for night-coverage after hours (4p -7a ) and weekends on [email or web address]

## 2017-03-06 NOTE — Progress Notes (Signed)
OT Cancellation Note  Patient Details Name: Shannon Lucas MRN: [id number] DOB: 1931/01/17   Cancelled Treatment:    Reason Eval/Treat Not Completed: Patient at procedure or test/ unavailable (Cath lab)  Malka So 03/06/2017, 7:56 AM

## 2017-03-06 NOTE — H&P (View-Only) (Signed)
Advanced Heart Failure Rounding Note   Subjective:    Sitting up in chair eating lunch. No CP or SOB . Renal function improving.    Objective:   Weight Range:  Vital Signs:   Temp:  [97.8 F (36.6 C)-98.4 F (36.9 C)] 98.4 F (36.9 C) (04/22 0554) Pulse Rate:  [63-90] 63 (04/22 0554) Resp:  [16-18] 18 (04/22 0554) BP: (128-149)/(53-54) 149/53 (04/22 0554) SpO2:  [98 %-100 %] 98 % (04/22 0554) Weight:  [93.5 kg (206 lb 3.2 oz)] 93.5 kg (206 lb 3.2 oz) (04/22 0554) Last BM Date: 03/04/17  Weight change: Filed Weights   03/03/17 0600 03/04/17 0631 03/05/17 0554  Weight: 93.8 kg (206 lb 11.2 oz) 94.2 kg (207 lb 9.6 oz) 93.5 kg (206 lb 3.2 oz)    Intake/Output:   Intake/Output Summary (Last 24 hours) at 03/05/17 1349 Last data filed at 03/05/17 1010  Gross per 24 hour  Intake             1135 ml  Output             2400 ml  Net            -1265 ml     Physical Exam: General:  Sitting in chair eating breakfast  HEENT: normal Neck: supple. no JVD. Carotids 2+ bilat; no bruits. No lymphadenopathy or thryomegaly appreciated. Cor: PMI nondisplaced. Regular rate & rhythm. No rubs, gallops or murmurs. Lungs: clear Abdomen: soft, nontender, nondistended. No hepatosplenomegaly. No bruits or masses. Good bowel sounds. Extremities: no cyanosis, clubbing, rash, edema Neuro: alert & orientedx3, cranial nerves grossly intact. moves all 4 extremities w/o difficulty. Affect pleasant   Telemetry: Sinus 60s. Personally reviewed     Labs: Basic Metabolic Panel:  Recent Labs Lab 03/01/17 0312 03/02/17 0324 03/03/17 0408 03/04/17 0636 03/05/17 0514  NA 141 138 137 136 140  K 3.7 3.8 3.9 4.4 4.2  CL 108 105 98* 101 108  CO2 25 25 25 25 24   GLUCOSE 152* 171* 121* 213* 147*  BUN 25* 29* 33* 34* 31*  CREATININE 1.79* 1.86* 2.12* 1.92* 1.76*  CALCIUM 8.9 8.9 9.1 8.9 9.1    Liver Function Tests: No results for input(s): AST, ALT, ALKPHOS, BILITOT, PROT, ALBUMIN in  the last 168 hours. No results for input(s): LIPASE, AMYLASE in the last 168 hours. No results for input(s): AMMONIA in the last 168 hours.  CBC:  Recent Labs Lab 02/28/17 1629 03/01/17 0932 03/02/17 0324 03/04/17 0636  WBC 7.9 6.7 6.7 7.8  HGB 9.8* 9.4* 9.3* 10.2*  HCT 31.8* 29.4* 30.2* 33.2*  MCV 86.4 87.0 85.6 86.5  PLT 211 198 209 243    Cardiac Enzymes:  Recent Labs Lab 02/28/17 2202 03/01/17 0312 03/01/17 0932  TROPONINI 0.05* 0.05* 0.04*    BNP: BNP (last 3 results)  Recent Labs  02/28/17 1629  BNP 1,488.1*    ProBNP (last 3 results) No results for input(s): PROBNP in the last 8760 hours.    Other results:  Imaging: No results found.   Medications:     Scheduled Medications: . amiodarone  100 mg Oral Daily  . ferrous sulfate  325 mg Oral TID WC  . hydrALAZINE  25 mg Oral Q8H  . insulin aspart  0-15 Units Subcutaneous TID WC  . insulin glargine  30 Units Subcutaneous QHS  . isosorbide mononitrate  30 mg Oral Daily  . polyethylene glycol  17 g Oral BID  . pravastatin  40 mg Oral  QHS  . sodium chloride flush  3 mL Intravenous Q12H  . venlafaxine  75 mg Oral Once    Infusions: . sodium chloride      PRN Medications: sodium chloride, acetaminophen, albuterol, benzonatate, diphenhydrAMINE, ondansetron (ZOFRAN) IV, sodium chloride flush   Assessment/Plan/Discussion    Ms Beighley is an 83 year old with a history of PAF, MI, DMII, PAF, and CKD Stage III admitted with acute HF exacerbation.   1. Acute Systolic Heart Failure: NYHA IIIB. Has had MI in the past. Last cath about 15 years ago. ECHO EF 25-30%. -Diuretics on held due to AKI. Volume status looks ok. No CP.  - Off arb/dig/spiro due to AKI -Continue hydralazine to 25 mg three times a day and iimdur 30 mg daily.  -Plan RHC/LHC tomorrow to evaluate cors and hemodynamics. We discussed the procedure. Orders written  2. AKI /CKD Stage III-Creatinine baseline 1.5.  -Creatinine continue  to trend back down with holding diuretics. [place] for cath  3. PAF- Maintaining NSR. Heart running 50-60s. Continue amio 100 mg daily. On Xarelto 15 mg  4. DMII- Per primary team.  5. Anemia- Iron Sats low. Started on iron.   RHC/LHC tomorrow at 7:30a. Xarelto held yesterday. No need for bridge.    Length of Stay: 3  Roxsana Riding MD 03/05/2017, 1:49 PM  Advanced Heart Failure Team Pager [phone] (M-F; [place])  Please contact [facility] for night-coverage after hours (4p -7a ) and weekends on [email or web address]

## 2017-03-06 NOTE — Progress Notes (Signed)
Physical Therapy Cancellation Note   03/06/17 0823  PT Visit Information  Last PT Received On 03/06/17  Reason Eval/Treat Not Completed Patient at procedure or test/unavailable; PT will check on pt later as time allows.   History of Present Illness Pt is an 83 y/o female admitted secondary to dyspnea and bilateral LE edema found to have an acute CHF exacerbation. PMH including but not limited to a-fib, CHF, DM, HTN and CKD.  Earney Navy, PTA Pager: [phone]

## 2017-03-06 NOTE — Progress Notes (Signed)
Family Medicine Teaching Service Daily Progress Note Intern Pager: [phone]  Patient name: Shannon Lucas Medical record number: [id number] Date of birth: 03-31-1931 Age: 83 y.o. Gender: female  Primary Care Provider: Manfred Shirts, PA Consultants: None Code Status: Full   Assessment and Plan: 83 y.o. female presenting with dyspnea, cough, increasing edema. PMH is significant for paroxysmal atrial fibrillation on Xarelto, CKD stage 3, type 2 diabetes, GERD, hyperlipidemia, stroke, ? MI, anemia, CHF  HFrEF exacerbation, improved: Euvolemic at her dry weight of 205#. Output of 1.7L in last 24hr, net -4.1L since admission. Echo with EF 25-30% and moderate pulmonary HTN. R/LHC no significant disease. - Strict I's and O's, Daily weights - Restart Xarelto post cath  - Continue hydralazine 25mg  TID,  imdur 30mg  qd, amiodarone 100 mg qd - per HF team, IV Lasix 40mg  once today then po lasix 40mg  qd starting tomorrow 4/24, add spironolactone 12.5mg  qd. No beta blocker d/t low HR. Follow up in HF clinic in 10 days. - PT/OT: no follow up - check BMP in am given start of new medications   AKI on CKD stage III, improved. Creatinine 1.64 (improved from 1.76). Baseline creatinine 1.49-1.6 per care everywhere.  - Holding home cozaar - recheck SCr in am given cath today  Paroxysmal atrial fibrillation: On Xarelto chronically. Currently in sinus rhythm. - Continue home amiodarone as above - Restarting Xarelto post cath  Prolonged QTc: QTc of 508 - recheck EKG  - Avoid QT prolonging medications - Monitor electrolytes - monitor on telemetry  Type 2 diabetes: Hemoglobin A1c 6.9. At home on Lantus 57 units daily at bedtime. CBG 115 this AM.  - Continue Lantus 30 units daily at bedtime - Moderate sliding scale insulin - CBGs with meals and at bedtime   Anemia 2/2 iron deficiency: Hemoglobin stable. Anemia panel c/w iron deficiency. - Continue daily iron supplementation - Miralax daily to avoid  constipation  FEN/GI: Saline lock IV, Heart healthy carb modified diet Prophylaxis: Xarelto restarted post cath  Disposition: pending medical management, anticipate DC home tomorrow 4/24  Subjective:  Feels well today. No concerns. Is asking when she is going home. Family at bedside asking if patient can be monitored overnight.   Objective: Temp:  [97.5 F (36.4 C)-98.7 F (37.1 C)] 98.7 F (37.1 C) (04/23 1120) Pulse Rate:  [51-75] 75 (04/23 1120) Resp:  [10-23] 18 (04/23 1120) BP: (135-163)/(41-74) 148/50 (04/23 1120) SpO2:  [95 %-100 %] 98 % (04/23 1120) Weight:  [205 lb 11.2 oz (93.3 kg)] 205 lb 11.2 oz (93.3 kg) (04/23 0541) Physical Exam: General: Elderly female in no acute distress, sitting in bed conversing with family Cardiovascular: RRR, no murmurs Respiratory: Normal effort on room air. CTAB Gastrointestinal: Soft, nondistended, nontender, normal bowel sounds MSK: Moves all extremities spontaneously, full range of motion of all joints Extremities: no edema. Warm and well perfused   Laboratory:  Recent Labs Lab 03/04/17 0636 03/06/17 0519 03/06/17 0939  WBC 7.8 7.5 7.3  HGB 10.2* 9.4* 9.9*  HCT 33.2* 30.6* 31.7*  PLT 243 241 220    Recent Labs Lab 03/04/17 0636 03/05/17 0514 03/06/17 0519 03/06/17 0939  NA 136 140 141  --   K 4.4 4.2 4.1  --   CL 101 108 110  --   CO2 25 24 23   --   BUN 34* 31* 29*  --   CREATININE 1.92* 1.76* 1.64* 1.70*  CALCIUM 8.9 9.1 9.1  --   GLUCOSE 213* 147* 115*  --  Iron/TIBC/Ferritin/ %Sat    Component Value Date/Time   IRON 19 (L) 02/28/2017 2202   TIBC 381 02/28/2017 2202   FERRITIN 34 02/28/2017 2202   IRONPCTSAT 5 (L) 02/28/2017 2202   Lipid panel per care everywhere Chol 149 TG 66 LDL77 HDL59  Bufford Lope, DO 03/06/2017, 1:04 PM PGY-1, [place] Intern pager: [phone], text pages welcome

## 2017-03-07 LAB — BASIC METABOLIC PANEL
Anion gap: 9 (ref 5–15)
BUN: 34 mg/dL — AB (ref 6–20)
CHLORIDE: 104 mmol/L (ref 101–111)
CO2: 25 mmol/L (ref 22–32)
CREATININE: 1.95 mg/dL — AB (ref 0.44–1.00)
Calcium: 9 mg/dL (ref 8.9–10.3)
GFR calc Af Amer: 26 mL/min — ABNORMAL LOW (ref >60)
GFR calc non Af Amer: 23 mL/min — ABNORMAL LOW (ref >60)
GLUCOSE: 190 mg/dL — AB (ref 65–99)
POTASSIUM: 4.7 mmol/L (ref 3.5–5.1)
SODIUM: 138 mmol/L (ref 135–145)

## 2017-03-07 LAB — GLUCOSE, CAPILLARY
Glucose-Capillary: 171 mg/dL — ABNORMAL HIGH (ref 65–99)
Glucose-Capillary: 191 mg/dL — ABNORMAL HIGH (ref 65–99)

## 2017-03-07 MED ORDER — FERROUS SULFATE 325 (65 FE) MG PO TABS: 325.0000 mg | ORAL_TABLET | Freq: Three times a day (TID) | ORAL | 0 refills | Status: DC

## 2017-03-07 MED ORDER — FUROSEMIDE 40 MG PO TABS: 40.0000 mg | ORAL_TABLET | Freq: Every day | ORAL | 0 refills | Status: DC

## 2017-03-07 MED ORDER — HYDRALAZINE HCL 25 MG PO TABS: 37.5000 mg | ORAL_TABLET | Freq: Three times a day (TID) | ORAL | 0 refills | Status: DC

## 2017-03-07 MED ORDER — FUROSEMIDE 40 MG PO TABS: 40.0000 mg | ORAL_TABLET | Freq: Every day | ORAL | Status: DC

## 2017-03-07 MED ORDER — AMIODARONE HCL 100 MG PO TABS: 100.0000 mg | ORAL_TABLET | Freq: Every day | ORAL | 0 refills | Status: DC

## 2017-03-07 MED ORDER — SPIRONOLACTONE 25 MG PO TABS: 12.5000 mg | ORAL_TABLET | Freq: Every day | ORAL | 0 refills | Status: DC

## 2017-03-07 MED ORDER — HYDRALAZINE HCL 25 MG PO TABS
37.5000 mg | ORAL_TABLET | Freq: Three times a day (TID) | ORAL | Status: DC
  Administered 2017-03-07: 37.5 mg via ORAL
  Filled 2017-03-07: qty 2

## 2017-03-07 NOTE — Progress Notes (Signed)
Occupational Therapy Treatment Patient Details Name: Shannon Lucas MRN: [id number] DOB: November 19, 1930 Today's Date: 03/07/2017    History of present illness Pt is an 83 y/o female admitted secondary to dyspnea and bilateral LE edema found to have an acute CHF exacerbation. PMH including but not limited to a-fib, CHF, DM, HTN and CKD.   OT comments  Pt performed sponge bathing and dressing with supervision seated at sink on BSC. Reinforced energy conservation strategies. Pt with some decreased safety awareness requiring verbal cues and inefficient during ADL. Pt to return home today with her daughter's assist.  Follow Up Recommendations  No OT follow up    Equipment Recommendations  None recommended by OT    Recommendations for Other Services      Precautions / Restrictions Precautions Precautions: Fall Restrictions Weight Bearing Restrictions: No       Mobility Bed Mobility               General bed mobility comments: pt seated at EOB  Transfers Overall transfer level: Needs assistance Equipment used: Rolling walker (2 wheeled) Transfers: Sit to/from Stand Sit to Stand: Supervision         General transfer comment: supervision for safety    Balance Overall balance assessment: Needs assistance   Sitting balance-Leahy Scale: Good       Standing balance-Leahy Scale: Fair Standing balance comment: able to release walker at sink and to wash periarea                           ADL either performed or assessed with clinical judgement   ADL Overall ADL's : Needs assistance/impaired     Grooming: Standing;Supervision/safety   Upper Body Bathing: Set up;Sitting   Lower Body Bathing: Supervison/ safety;Sit to/from stand   Upper Body Dressing : Set up;Sitting   Lower Body Dressing: Supervision/safety;Sit to/from stand;Cueing for safety Lower Body Dressing Details (indicate cue type and reason): jeans and slip on shoes Toilet Transfer:  Supervision/safety;Ambulation;Regular Toilet   Toileting- Water quality scientist and Hygiene: Supervision/safety;Sit to/from stand;Cueing for safety       Functional mobility during ADLs: Supervision/safety;Rolling walker General ADL Comments: cued pt to sit during sponge bathing to decrease fatigue     Vision       Perception     Praxis      Cognition Arousal/Alertness: Awake/alert Behavior During Therapy: WFL for tasks assessed/performed Overall Cognitive Status: No family/caregiver present to determine baseline cognitive functioning                                 General Comments: some disorganization with ADL        Exercises     Shoulder Instructions       General Comments      Pertinent Vitals/ Pain       Pain Assessment: No/denies pain Faces Pain Scale: Hurts a little bit Pain Location: R UE Pain Descriptors / Indicators: Sore Pain Intervention(s): Patient requesting pain meds-RN notified  Home Living                                          Prior Functioning/Environment              Frequency  Min 2X/week        Progress Toward Goals  OT Goals(current goals can now be found in the care plan section)  Progress towards OT goals: Progressing toward goals  Acute Rehab OT Goals Patient Stated Goal: return home Time For Goal Achievement: 03/08/17 Potential to Achieve Goals: Good  Plan Discharge plan remains appropriate    Co-evaluation                 End of Session Equipment Utilized During Treatment: Rolling walker  OT Visit Diagnosis: Muscle weakness (generalized) (M62.81)   Activity Tolerance Patient tolerated treatment well   Patient Left in chair;with call bell/phone within reach   Nurse Communication Patient requests pain meds        Time: 1335-1403 OT Time Calculation (min): 28 min  Charges: OT General Charges $OT Visit: 1 Procedure OT Treatments $Self Care/Home Management :  23-37 mins     Malka So 03/07/2017, 2:10 PM  [phone]

## 2017-03-07 NOTE — Progress Notes (Signed)
Family Medicine Teaching Service Daily Progress Note Intern Pager: [phone]  Patient name: Shannon Lucas Medical record number: [id number] Date of birth: [birth year] Age: 83 y.o. Gender: female  Primary Care Provider: Manfred Shirts, PA Consultants: None Code Status: Full   Assessment and Plan: 83 y.o. female presenting with dyspnea, cough, increasing edema. PMH is significant for paroxysmal atrial fibrillation on Xarelto, CKD stage 3, type 2 diabetes, GERD, hyperlipidemia, stroke, ? MI, anemia, CHF  HFrEF exacerbation, improved: Euvolemic at her dry weight of 205#. Output of 1.8L in last 24hr, net -4.5L since admission. Echo with EF 25-30% and moderate pulmonary HTN. R/LHC no significant disease. Electrolytes stable this am. - Strict I's and O's, Daily weights - Continue hydralazine 25mg  TID,  imdur 30mg  qd, amiodarone 100 mg qd, spironolactone 12.5mg  qd - Per HF team, start po lasix 40mg  qd tomorrow 4/25. No beta blocker d/t low HR. Needs BMET on Friday, arranged by HF clinic. Follow up in HF clinic in 10 days. - PT/OT: no follow up   AKI on CKD stage III, mildly worsened: Creatinine 1.95 up from 1.7 likely 2/2 cath. Baseline creatinine 1.49-1.6 per care everywhere.  - Holding home cozaar - encourage po hydration  Paroxysmal atrial fibrillation: On Xarelto chronically. Currently in sinus rhythm. - Continue home amiodarone and Xarelto   Prolonged QTc: Still prolonged on recheck EKG this am QTc of 507 - Avoid QT prolonging medications - Monitor electrolytes  R radial cath site hematoma, stable. Hgb 9.9 stable since admission. - monitor Hgb  Type 2 diabetes: Hemoglobin A1c 6.9. At home on Lantus 57 units daily at bedtime. CBG 171 this AM.  - Continue Lantus 30 units daily at bedtime - Moderate sliding scale insulin - CBGs with meals and at bedtime   Anemia 2/2 iron deficiency: Hemoglobin stable. Anemia panel c/w iron deficiency. - Continue daily iron supplementation - Miralax  daily to avoid constipation  FEN/GI: Saline lock IV, Heart healthy carb modified diet Prophylaxis: Xarelto   Disposition: likely DC home today 4/25  Subjective:  Feels well today. No CP, SOB. No concerns. Is asking when she is going home.  Objective: Temp:  [97.5 F (36.4 C)-98.7 F (37.1 C)] 97.7 F (36.5 C) (04/24 0754) Pulse Rate:  [48-96] 61 (04/24 0754) Resp:  [18-20] 18 (04/24 0754) BP: (126-166)/(46-102) 158/60 (04/24 0754) SpO2:  [94 %-100 %] 100 % (04/24 0754) Weight:  [205 lb 6.4 oz (93.2 kg)] 205 lb 6.4 oz (93.2 kg) (04/24 0557) Physical Exam:  General: Elderly female in no acute distress, sitting in chair comfortably Cardiovascular: RRR, no murmurs Respiratory: Normal effort on room air. CTAB Gastrointestinal: Soft, nondistended, nontender, normal bowel sounds MSK: Moves all extremities spontaneously, full range of motion of all joints Extremities: no edema. Warm and well perfused. R wrist with 1 inch x 1 inch area of healing ecchymosis that has expanded minimally beyond markings.   Laboratory:  Recent Labs Lab 03/04/17 0636 03/06/17 0519 03/06/17 0939  WBC 7.8 7.5 7.3  HGB 10.2* 9.4* 9.9*  HCT 33.2* 30.6* 31.7*  PLT 243 241 220    Recent Labs Lab 03/05/17 0514 03/06/17 0519 03/06/17 0939 03/07/17 0220  NA 140 141  --  138  K 4.2 4.1  --  4.7  CL 108 110  --  104  CO2 24 23  --  25  BUN 31* 29*  --  34*  CREATININE 1.76* 1.64* 1.70* 1.95*  CALCIUM 9.1 9.1  --  9.0  GLUCOSE 147* 115*  --  190*    Iron/TIBC/Ferritin/ %Sat    Component Value Date/Time   IRON 19 (L) 02/28/2017 2202   TIBC 381 02/28/2017 2202   FERRITIN 34 02/28/2017 2202   IRONPCTSAT 5 (L) 02/28/2017 2202   Lipid panel per care everywhere Chol 149 TG 66 LDL77 HDL59  Bufford Lope, DO 03/07/2017, 9:40 AM PGY-1, [place] Intern pager: [phone], text pages welcome

## 2017-03-07 NOTE — Progress Notes (Signed)
Patient ID: Shannon Lucas, female   DOB: [birth year], 83 y.o.   MRN: [id number]    Advanced Heart Failure Rounding Note   Subjective:    No dyspnea or chest pain.  Developed small hematoma right radial site last night that improved considerably with pressure.  No hand or wrist pain this morning.  Creatinine 1.95.    Coronary Findings   Dominance: Co-dominant  Left Main  Separate ostia LAD and LCx.  Left Anterior Descending  Luminal irregularities in the LAD. Small to moderate D1 with 70% ostial stenosis. Small to moderate D2 with 50% ostial stenosis.  Left Circumflex  Large caliber vessel. Small to moderate OM1 with 40% ostial stenosis.  Right Coronary Artery  No significant disease. Relatively small vessel.  Right Heart   Right Heart Pressures RHC Procedural Findings: Hemodynamics (mmHg) RA mean 10 RV 74/14 PA 78/28, mean 44 PCWP mean 25 LV 165/25 AO 146/54 Oxygen saturations: PA 70% AO 99% Cardiac Output (Fick) 7.21  Cardiac Index (Fick) 3.59 PVR 2.65 WU         Objective:   Weight Range:  Vital Signs:   Temp:  [97.5 F (36.4 C)-98.7 F (37.1 C)] 97.7 F (36.5 C) (04/24 0754) Pulse Rate:  [48-96] 61 (04/24 0754) Resp:  [10-23] 18 (04/24 0754) BP: (126-166)/(46-102) 158/60 (04/24 0754) SpO2:  [94 %-100 %] 100 % (04/24 0754) Weight:  [205 lb 6.4 oz (93.2 kg)] 205 lb 6.4 oz (93.2 kg) (04/24 0557) Last BM Date: 03/06/17  Weight change: Filed Weights   03/05/17 0554 03/06/17 0541 03/07/17 0557  Weight: 206 lb 3.2 oz (93.5 kg) 205 lb 11.2 oz (93.3 kg) 205 lb 6.4 oz (93.2 kg)    Intake/Output:   Intake/Output Summary (Last 24 hours) at 03/07/17 0805 Last data filed at 03/07/17 0554  Gross per 24 hour  Intake             1355 ml  Output             1800 ml  Net             -445 ml     Physical Exam: General: NAD  HEENT: normal Neck: supple. JVP 8 cm. Carotids 2+ bilat; no bruits. No lymphadenopathy or thryomegaly appreciated. Cor: PMI nondisplaced.  Regular rate & rhythm. No rubs, gallops or murmurs. Lungs: clear Abdomen: soft, nontender, nondistended. No hepatosplenomegaly. No bruits or masses. Good bowel sounds. Extremities: no cyanosis, clubbing, rash, edema.  Right radial cath site with ecchymosis (mild), no significant raised hematoma, 2+ radial pulse on right.  Neuro: alert & orientedx3, cranial nerves grossly intact. moves all 4 extremities w/o difficulty. Affect pleasant  Telemetry: Sinus 50s-60s. Personally reviewed  Labs: Basic Metabolic Panel:  Recent Labs Lab 03/03/17 0408 03/04/17 0636 03/05/17 0514 03/06/17 0519 03/06/17 0939 03/07/17 0220  NA 137 136 140 141  --  138  K 3.9 4.4 4.2 4.1  --  4.7  CL 98* 101 108 110  --  104  CO2 25 25 24 23   --  25  GLUCOSE 121* 213* 147* 115*  --  190*  BUN 33* 34* 31* 29*  --  34*  CREATININE 2.12* 1.92* 1.76* 1.64* 1.70* 1.95*  CALCIUM 9.1 8.9 9.1 9.1  --  9.0    Liver Function Tests: No results for input(s): AST, ALT, ALKPHOS, BILITOT, PROT, ALBUMIN in the last 168 hours. No results for input(s): LIPASE, AMYLASE in the last 168 hours. No results for input(s): AMMONIA in the  last 168 hours.  CBC:  Recent Labs Lab 03/01/17 0932 03/02/17 0324 03/04/17 0636 03/06/17 0519 03/06/17 0939  WBC 6.7 6.7 7.8 7.5 7.3  HGB 9.4* 9.3* 10.2* 9.4* 9.9*  HCT 29.4* 30.2* 33.2* 30.6* 31.7*  MCV 87.0 85.6 86.5 86.9 87.8  PLT 198 209 243 241 220    Cardiac Enzymes:  Recent Labs Lab 02/28/17 2202 03/01/17 0312 03/01/17 0932  TROPONINI 0.05* 0.05* 0.04*    BNP: BNP (last 3 results)  Recent Labs  02/28/17 1629  BNP 1,488.1*    ProBNP (last 3 results) No results for input(s): PROBNP in the last 8760 hours.    Other results:  Imaging: No results found.   Medications:     Scheduled Medications: . amiodarone  100 mg Oral Daily  . ferrous sulfate  325 mg Oral TID WC  . [START ON 03/08/2017] furosemide  40 mg Oral Daily  . hydrALAZINE  37.5 mg Oral Q8H  .  insulin aspart  0-15 Units Subcutaneous TID WC  . insulin glargine  30 Units Subcutaneous QHS  . isosorbide mononitrate  30 mg Oral Daily  . polyethylene glycol  17 g Oral BID  . pravastatin  40 mg Oral QHS  . rivaroxaban  15 mg Oral Q supper  . sodium chloride flush  3 mL Intravenous Q12H  . sodium chloride flush  3 mL Intravenous Q12H  . spironolactone  12.5 mg Oral Daily  . venlafaxine  75 mg Oral Once    Infusions: . sodium chloride    . sodium chloride      PRN Medications: sodium chloride, sodium chloride, acetaminophen, albuterol, benzonatate, diphenhydrAMINE, ondansetron (ZOFRAN) IV, sodium chloride flush, sodium chloride flush   Assessment/Plan/Discussion    Ms Shannon Lucas is an 83 year old with a history of PAF, MI, DMII, PAF, and CKD Stage III admitted with acute HF exacerbation.   1. Acute Systolic Heart Failure: NYHA IIIB at admission. Has history of remote prior MI. ECHO EF 25-30%.  See cath above, nonischemic cardiomyopathy.  Filling pressures were mildly elevated after holding Lasix pre-cath.  She got 1 dose IV Lasix yesterday. Volume status looks ok on exam today.  - With mild rise in creatinine today, will start po Lasix 40 daily tomorrow (rather than today).  - Continue spironolactone 12.5 mg daily, will have her get a BMET done on Friday this week.  - No ACEI/ARB yet with elevated creatinine.  HR in 50s, hold off on Coreg for now.  - Elevated BP, increase hydralazine to 37.5 mg tid and continue Imdur.   2. AKI /CKD Stage III: Creatinine 1.95, mildly up after cath and Lasix IV.  Will arrange to recheck in office on Friday.   3. PAF: Maintaining NSR. Heart running 50-60s.  - Continue amio 100 mg daily.  - Continue Xarelto 15 daily.  4. DMII: Per primary team.  5. Anemia: Iron Sats low. Started on iron.  6. Disposition: I think she can go home today if ok with primary service.  She will need followup for medication titration in CHF clinic, will arrange for 10 days or  so.  She will need a BMET on Friday.  Cardiac meds for discharge: Xarelto 15 mg daily, Lasix 40 mg po daily (start tomorrow), spironolactone 12.5 mg daily, hydralazine 37.5 mg tid, Imdur 30 daily, amiodarone 100 mg daily.   Length of Stay: 4  Loralie Champagne MD 03/07/2017, 8:05 AM  Advanced Heart Failure Team Pager [phone] (M-F; 7a - 4p)  Please  contact [facility] for night-coverage after hours (4p -7a ) and weekends on [email or web address]

## 2017-03-07 NOTE — Care Management Important Message (Signed)
Important Message  Patient Details  Name: Shannon Lucas MRN: [id number] Date of Birth: [birth year]   Medicare Important Message Given:  Yes    [name] Montine Circle 03/07/2017, 3:05 PM

## 2017-03-07 NOTE — Discharge Instructions (Signed)
It has been a pleasure taking care of you! You were admitted due to heart failure. We have treated you with lasix, a diuretric medication. With that your symptoms improved to the point we think it is safe to let you go home and follow up with your primary care doctor. We are discharging you on several new medications for heart failure that you need to continue taking after you leave the hospital. There could be some changes made to your home medications during this hospitalization. Please, make sure to read the directions before you take them. The names and directions on how to take these medications are found on this discharge paper under medication section.  Please follow up at your primary care doctor's office and with the Heart Failure team. The address, date and time are found on the discharge paper under follow up section.   Information on my medicine - XARELTO (Rivaroxaban)  This medication education was reviewed with me or my healthcare representative as part of my discharge preparation.   Why was Xarelto prescribed for you?  (you were taking Xarelto (rIVAROXABAN) PRIOR TO THIS HOSPITALIZATION)  Xarelto was prescribed for you to reduce the risk of a blood clot forming that can cause a stroke if you have a medical condition called atrial fibrillation (a type of irregular heartbeat).  What do you need to know about xarelto ? Take your Xarelto ONCE DAILY at the same time every day with your evening meal. If you have difficulty swallowing the tablet whole, you may crush it and mix in applesauce just prior to taking your dose.  Take Xarelto exactly as prescribed by your doctor and DO NOT stop taking Xarelto without talking to the doctor who prescribed the medication.  Stopping without other stroke prevention medication to take the place of Xarelto may increase your risk of developing a clot that causes a stroke.  Refill your prescription before you run out.  After discharge, you should  have regular check-up appointments with your healthcare provider that is prescribing your Xarelto.  In the future your dose may need to be changed if your kidney function or weight changes by a significant amount.  What do you do if you miss a dose? If you are taking Xarelto ONCE DAILY and you miss a dose, take it as soon as you remember on the same day then continue your regularly scheduled once daily regimen the next day. Do not take two doses of Xarelto at the same time or on the same day.   Important Safety Information A possible side effect of Xarelto is bleeding. You should call your healthcare provider right away if you experience any of the following: ? Bleeding from an injury or your nose that does not stop. ? Unusual colored urine (red or dark brown) or unusual colored stools (red or black). ? Unusual bruising for unknown reasons. ? A serious fall or if you hit your head (even if there is no bleeding).  Some medicines may interact with Xarelto and might increase your risk of bleeding while on Xarelto. To help avoid this, consult your healthcare provider or pharmacist prior to using any new prescription or non-prescription medications, including herbals, vitamins, non-steroidal anti-inflammatory drugs (NSAIDs) and supplements.  This website has more information on Xarelto: [email or web address]

## 2017-03-08 ENCOUNTER — Emergency Department (HOSPITAL_BASED_OUTPATIENT_CLINIC_OR_DEPARTMENT_OTHER)
Admit: 2017-03-08 | Discharge: 2017-03-08 | Disposition: A | Discharge status: Home / Self Care | Payer: Medicare Other | Attending: Student | Admitting: Student

## 2017-03-08 ENCOUNTER — Encounter (HOSPITAL_COMMUNITY)
Admission: EM | Disposition: A | Discharge status: Home / Self Care | Payer: Self-pay | Source: Home / Self Care | Attending: Emergency Medicine

## 2017-03-08 ENCOUNTER — Emergency Department (HOSPITAL_COMMUNITY): Payer: Medicare Other | Admitting: Certified Registered Nurse Anesthetist

## 2017-03-08 ENCOUNTER — Emergency Department (HOSPITAL_COMMUNITY)
Admission: EM | Admit: 2017-03-08 | Discharge: 2017-03-08 | Disposition: A | Discharge status: Home / Self Care | Payer: Medicare Other | Attending: Emergency Medicine | Admitting: Emergency Medicine

## 2017-03-08 ENCOUNTER — Encounter (HOSPITAL_COMMUNITY): Payer: Self-pay

## 2017-03-08 DIAGNOSIS — Z7901 Long term (current) use of anticoagulants: Secondary | ICD-10-CM | POA: Insufficient documentation

## 2017-03-08 DIAGNOSIS — Z8673 Personal history of transient ischemic attack (TIA), and cerebral infarction without residual deficits: Secondary | ICD-10-CM | POA: Diagnosis not present

## 2017-03-08 DIAGNOSIS — I5021 Acute systolic (congestive) heart failure: Secondary | ICD-10-CM | POA: Diagnosis not present

## 2017-03-08 DIAGNOSIS — I252 Old myocardial infarction: Secondary | ICD-10-CM | POA: Insufficient documentation

## 2017-03-08 DIAGNOSIS — I729 Aneurysm of unspecified site: Secondary | ICD-10-CM | POA: Diagnosis not present

## 2017-03-08 DIAGNOSIS — E1122 Type 2 diabetes mellitus with diabetic chronic kidney disease: Secondary | ICD-10-CM | POA: Diagnosis not present

## 2017-03-08 DIAGNOSIS — I130 Hypertensive heart and chronic kidney disease with heart failure and stage 1 through stage 4 chronic kidney disease, or unspecified chronic kidney disease: Secondary | ICD-10-CM | POA: Diagnosis not present

## 2017-03-08 DIAGNOSIS — D649 Anemia, unspecified: Secondary | ICD-10-CM | POA: Diagnosis not present

## 2017-03-08 DIAGNOSIS — Z87891 Personal history of nicotine dependence: Secondary | ICD-10-CM | POA: Diagnosis not present

## 2017-03-08 DIAGNOSIS — N183 Chronic kidney disease, stage 3 (moderate): Secondary | ICD-10-CM | POA: Diagnosis not present

## 2017-03-08 DIAGNOSIS — E7800 Pure hypercholesterolemia, unspecified: Secondary | ICD-10-CM | POA: Insufficient documentation

## 2017-03-08 DIAGNOSIS — Z79899 Other long term (current) drug therapy: Secondary | ICD-10-CM | POA: Diagnosis not present

## 2017-03-08 DIAGNOSIS — Z9889 Other specified postprocedural states: Secondary | ICD-10-CM | POA: Diagnosis not present

## 2017-03-08 DIAGNOSIS — I480 Paroxysmal atrial fibrillation: Secondary | ICD-10-CM | POA: Insufficient documentation

## 2017-03-08 DIAGNOSIS — Z794 Long term (current) use of insulin: Secondary | ICD-10-CM | POA: Diagnosis not present

## 2017-03-08 DIAGNOSIS — T81718A Complication of other artery following a procedure, not elsewhere classified, initial encounter: Secondary | ICD-10-CM

## 2017-03-08 DIAGNOSIS — I9789 Other postprocedural complications and disorders of the circulatory system, not elsewhere classified: Secondary | ICD-10-CM | POA: Diagnosis not present

## 2017-03-08 HISTORY — PX: FALSE ANEURYSM REPAIR: SHX5152

## 2017-03-08 LAB — CBC
HCT: 32.3 % — ABNORMAL LOW (ref 36.0–46.0)
Hemoglobin: 9.8 g/dL — ABNORMAL LOW (ref 12.0–15.0)
MCH: 26.7 pg (ref 26.0–34.0)
MCHC: 30.3 g/dL (ref 30.0–36.0)
MCV: 88 fL (ref 78.0–100.0)
PLATELETS: 272 10*3/uL (ref 150–400)
RBC: 3.67 MIL/uL — AB (ref 3.87–5.11)
RDW: 17.6 % — AB (ref 11.5–15.5)
WBC: 9.4 10*3/uL (ref 4.0–10.5)

## 2017-03-08 LAB — POCT I-STAT, CHEM 8
BUN: 38 mg/dL — ABNORMAL HIGH (ref 6–20)
CHLORIDE: 104 mmol/L (ref 101–111)
Calcium, Ion: 1.22 mmol/L (ref 1.15–1.40)
Creatinine, Ser: 1.7 mg/dL — ABNORMAL HIGH (ref 0.44–1.00)
GLUCOSE: 83 mg/dL (ref 65–99)
HEMATOCRIT: 33 % — AB (ref 36.0–46.0)
HEMOGLOBIN: 11.2 g/dL — AB (ref 12.0–15.0)
POTASSIUM: 4.2 mmol/L (ref 3.5–5.1)
SODIUM: 138 mmol/L (ref 135–145)
TCO2: 23 mmol/L (ref 0–100)

## 2017-03-08 LAB — GLUCOSE, CAPILLARY
Glucose-Capillary: 78 mg/dL (ref 65–99)
Glucose-Capillary: 133 mg/dL — ABNORMAL HIGH (ref 65–99)
Glucose-Capillary: 112 mg/dL — ABNORMAL HIGH (ref 65–99)

## 2017-03-08 SURGERY — REPAIR, PSEUDOANEURYSM
Anesthesia: General | Site: Arm Lower | Laterality: Right

## 2017-03-08 MED ORDER — FENTANYL CITRATE (PF) 250 MCG/5ML IJ SOLN
INTRAMUSCULAR | Status: AC
  Filled 2017-03-08: qty 5

## 2017-03-08 MED ORDER — PROPOFOL 10 MG/ML IV BOLUS
INTRAVENOUS | Status: AC
  Filled 2017-03-08: qty 20

## 2017-03-08 MED ORDER — CEFAZOLIN SODIUM 1 G IJ SOLR
INTRAMUSCULAR | Status: AC
  Filled 2017-03-08: qty 10

## 2017-03-08 MED ORDER — FENTANYL CITRATE (PF) 100 MCG/2ML IJ SOLN: 25.0000 ug | INTRAMUSCULAR | Status: DC | PRN

## 2017-03-08 MED ORDER — FENTANYL CITRATE (PF) 100 MCG/2ML IJ SOLN
INTRAMUSCULAR | Status: DC | PRN
  Administered 2017-03-08 (×4): 25 ug via INTRAVENOUS

## 2017-03-08 MED ORDER — HEPARIN SODIUM (PORCINE) 5000 UNIT/ML IJ SOLN
INTRAMUSCULAR | Status: DC | PRN
  Administered 2017-03-08: 500 mL

## 2017-03-08 MED ORDER — DEXTROSE 50 % IV SOLN
INTRAVENOUS | Status: AC
  Administered 2017-03-08: 25 mL via INTRAVENOUS
  Filled 2017-03-08: qty 50

## 2017-03-08 MED ORDER — 0.9 % SODIUM CHLORIDE (POUR BTL) OPTIME
TOPICAL | Status: DC | PRN
  Administered 2017-03-08: 1000 mL

## 2017-03-08 MED ORDER — LIDOCAINE-EPINEPHRINE (PF) 1 %-1:200000 IJ SOLN
INTRAMUSCULAR | Status: AC
  Filled 2017-03-08: qty 30

## 2017-03-08 MED ORDER — LIDOCAINE-EPINEPHRINE (PF) 1 %-1:200000 IJ SOLN
INTRAMUSCULAR | Status: DC | PRN
  Administered 2017-03-08: 11 mL

## 2017-03-08 MED ORDER — BUPIVACAINE HCL (PF) 0.5 % IJ SOLN
INTRAMUSCULAR | Status: AC
  Filled 2017-03-08: qty 30

## 2017-03-08 MED ORDER — OXYCODONE HCL 5 MG/5ML PO SOLN: 5.0000 mg | Freq: Once | ORAL | Status: DC | PRN

## 2017-03-08 MED ORDER — MIDAZOLAM HCL 2 MG/2ML IJ SOLN
INTRAMUSCULAR | Status: DC | PRN
  Administered 2017-03-08 (×2): 1 mg via INTRAVENOUS

## 2017-03-08 MED ORDER — SODIUM CHLORIDE 0.9 % IV SOLN
INTRAVENOUS | Status: DC
  Administered 2017-03-08: 15:00:00 via INTRAVENOUS

## 2017-03-08 MED ORDER — OXYCODONE HCL 5 MG PO TABS: 5.0000 mg | ORAL_TABLET | Freq: Once | ORAL | Status: DC | PRN

## 2017-03-08 MED ORDER — MIDAZOLAM HCL 2 MG/2ML IJ SOLN
INTRAMUSCULAR | Status: AC
  Filled 2017-03-08: qty 2

## 2017-03-08 MED ORDER — CEFAZOLIN SODIUM 1 G IJ SOLR
INTRAMUSCULAR | Status: DC | PRN
  Administered 2017-03-08: 1 g via INTRAMUSCULAR

## 2017-03-08 MED ORDER — ONDANSETRON HCL 4 MG/2ML IJ SOLN: 4.0000 mg | Freq: Once | INTRAMUSCULAR | Status: DC | PRN

## 2017-03-08 MED ORDER — DEXTROSE 50 % IV SOLN
25.0000 mL | Freq: Once | INTRAVENOUS | Status: AC
  Administered 2017-03-08: 25 mL via INTRAVENOUS
  Filled 2017-03-08: qty 50

## 2017-03-08 SURGICAL SUPPLY — 32 items
ARMBAND PINK RESTRICT EXTREMIT (MISCELLANEOUS) ×4 IMPLANT
CANISTER SUCT 3000ML PPV (MISCELLANEOUS) ×2 IMPLANT
CANNULA VESSEL 3MM 2 BLNT TIP (CANNULA) ×2 IMPLANT
CLIP LIGATING EXTRA MED SLVR (CLIP) ×2 IMPLANT
CLIP LIGATING EXTRA SM BLUE (MISCELLANEOUS) ×2 IMPLANT
COVER PROBE W GEL 5X96 (DRAPES) ×2 IMPLANT
CUFF TOURNIQUET SINGLE 18IN (TOURNIQUET CUFF) ×2 IMPLANT
DECANTER SPIKE VIAL GLASS SM (MISCELLANEOUS) ×4 IMPLANT
DERMABOND ADVANCED (GAUZE/BANDAGES/DRESSINGS) ×1
DERMABOND ADVANCED .7 DNX12 (GAUZE/BANDAGES/DRESSINGS) ×1 IMPLANT
DRAPE EXTREMITY T 121X128X90 (DRAPE) ×2 IMPLANT
ELECT REM PT RETURN 9FT ADLT (ELECTROSURGICAL) ×2
ELECTRODE REM PT RTRN 9FT ADLT (ELECTROSURGICAL) ×1 IMPLANT
GAUZE SPONGE 4X4 12PLY STRL (GAUZE/BANDAGES/DRESSINGS) ×2 IMPLANT
GLOVE SKINSENSE NS SZ7.0 (GLOVE) ×1
GLOVE SKINSENSE STRL SZ7.0 (GLOVE) ×1 IMPLANT
GLOVE SS BIOGEL STRL SZ 7.5 (GLOVE) ×1 IMPLANT
GLOVE SUPERSENSE BIOGEL SZ 7.5 (GLOVE) ×1
GOWN STRL REUS W/ TWL LRG LVL3 (GOWN DISPOSABLE) ×3 IMPLANT
GOWN STRL REUS W/ TWL XL LVL3 (GOWN DISPOSABLE) ×1 IMPLANT
GOWN STRL REUS W/TWL LRG LVL3 (GOWN DISPOSABLE) ×3
GOWN STRL REUS W/TWL XL LVL3 (GOWN DISPOSABLE) ×1
KIT BASIN OR (CUSTOM PROCEDURE TRAY) ×2 IMPLANT
KIT ROOM TURNOVER OR (KITS) ×2 IMPLANT
NS IRRIG 1000ML POUR BTL (IV SOLUTION) ×2 IMPLANT
PACK CV ACCESS (CUSTOM PROCEDURE TRAY) ×2 IMPLANT
PAD ARMBOARD 7.5X6 YLW CONV (MISCELLANEOUS) ×4 IMPLANT
SUT PROLENE 6 0 CC (SUTURE) ×2 IMPLANT
SUT VIC AB 3-0 SH 27 (SUTURE) ×1
SUT VIC AB 3-0 SH 27X BRD (SUTURE) ×1 IMPLANT
UNDERPAD 30X30 (UNDERPADS AND DIAPERS) ×2 IMPLANT
WATER STERILE IRR 1000ML POUR (IV SOLUTION) ×2 IMPLANT

## 2017-03-08 NOTE — Progress Notes (Signed)
*  PRELIMINARY RESULTS* Vascular Ultrasound Limited Right Upper Extremity Arterial Duplex has been completed.   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  03/08/2017 1:35 PM Maudry Mayhew, BS, RVT, RDCS, RDMS

## 2017-03-08 NOTE — Anesthesia Postprocedure Evaluation (Addendum)
Anesthesia Post Note  Patient: Shannon Lucas  Procedure(s) Performed: Procedure(s) (LRB): REPAIR RIGHT RADIAL FALSE ANEURYSM (Right)  Patient location during evaluation: PACU Anesthesia Type: MAC Level of consciousness: awake, awake and alert and oriented Pain management: pain level controlled Vital Signs Assessment: post-procedure vital signs reviewed and stable Respiratory status: spontaneous breathing, nonlabored ventilation and respiratory function stable Cardiovascular status: blood pressure returned to baseline Anesthetic complications: no       Last Vitals:  Vitals:   03/08/17 1730 03/08/17 1745  BP: (!) 153/58 (!) 142/59  Pulse: (!) 48 (!) 49  Resp: 20 17  Temp:  36.7 C    Last Pain:  Vitals:   03/08/17 1730  TempSrc:   PainSc: 0-No pain                 [name] COKER

## 2017-03-08 NOTE — ED Notes (Signed)
[id number] report

## 2017-03-08 NOTE — Anesthesia Preprocedure Evaluation (Signed)
Anesthesia Evaluation  Patient identified by MRN, date of birth, ID band Patient awake    Reviewed: Allergy & Precautions, NPO status , Patient's Chart, lab work & pertinent test results  Airway Mallampati: II  TM Distance: >3 FB Neck ROM: Full    Dental  (+) Teeth Intact, Dental Advisory Given   Pulmonary former smoker,    breath sounds clear to auscultation       Cardiovascular hypertension,  Rhythm:Irregular Rate:Normal     Neuro/Psych    GI/Hepatic   Endo/Other  diabetes  Renal/GU      Musculoskeletal   Abdominal   Peds  Hematology   Anesthesia Other Findings   Reproductive/Obstetrics                             Anesthesia Physical Anesthesia Plan  ASA: III  Anesthesia Plan: General   Post-op Pain Management:    Induction: Intravenous  Airway Management Planned: Oral ETT  Additional Equipment:   Intra-op Plan:   Post-operative Plan: Extubation in OR  Informed Consent: I have reviewed the patients History and Physical, chart, labs and discussed the procedure including the risks, benefits and alternatives for the proposed anesthesia with the patient or authorized representative who has indicated his/her understanding and acceptance.   Dental advisory given  Plan Discussed with: CRNA and Anesthesiologist  Anesthesia Plan Comments:         Anesthesia Quick Evaluation

## 2017-03-08 NOTE — ED Triage Notes (Signed)
Pt reports she had a heart catheterization on Monday and they went into the right radial artery. She presents with circumferential bruising to right wrist. She called Dr. Benjamine Mola this morning and was told to come to the ER and to page him once pt is here.  Denies pain anywhere else.

## 2017-03-08 NOTE — Progress Notes (Signed)
Asked by Dr.Mclean to see patient due to hematoma to right wrist from cardiac cath done on Monday.  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  Manual pressure held over site x 30 minutes both the hematoma comes right back when manual pressure is taken off.  Dr. Donnetta Hutching seeing patient now.

## 2017-03-08 NOTE — Transfer of Care (Signed)
Immediate Anesthesia Transfer of Care Note  Patient: Shannon Lucas  Procedure(s) Performed: Procedure(s): REPAIR RIGHT RADIAL FALSE ANEURYSM (Right)  Patient Location: PACU  Anesthesia Type:MAC  Level of Consciousness: awake, oriented, sedated, patient cooperative and responds to stimulation  Airway & Oxygen Therapy: Patient Spontanous Breathing and Patient connected to nasal cannula oxygen  Post-op Assessment: Report given to RN and Post -op Vital signs reviewed and stable  Post vital signs: Reviewed and stable  Last Vitals:  Vitals:   03/08/17 1415 03/08/17 1430  BP: (!) 132/57 (!) 139/56  Pulse: (!) 44 (!) 47  Resp: 17 19  Temp:      Last Pain:  Vitals:   03/08/17 1243  TempSrc:   PainSc: 0-No pain         Complications: No apparent anesthesia complications

## 2017-03-08 NOTE — Consult Note (Signed)
[facility] [place]  Patient name: Shannon Lucas MRN: [id number] DOB: 1930/11/19 Sex: female  REASON FOR CONSULT: Right radial false aneurysm  HPI: Shannon Lucas is a 83 y.o. female, who is seen in consultation regarding right radial false aneurysm. She is today status post cardiac catheterization via right radial artery. Was noted to have hematoma following the procedure. She presented to the emergency department today with worsening pain and swelling since awakening this morning. She is right-handed. In reviewing her catheterization she was found to have nonobstructive coronary disease. Did have elevated filling pressures in her right and left heart. She does report some tingling but no true ischemic symptoms in her right hand.  Past Medical History:  Diagnosis Date  . A-fib ([place])   . Arthritis    "a little bit qwhere" (02/28/2017)  . CHF (congestive heart failure) ([place])   . CKD (chronic kidney disease), stage III    [name] 02/28/2017  . Depression   . GERD (gastroesophageal reflux disease)   . High cholesterol   . Hypertension   . Melanoma of back ([place])   . Myocardial infarction [facility])    "one dr said I'd had 1" (02/28/2017)  . Stroke [facility]) ~ 2010; ~2012   "affected the right side but I fully recovered; just a light one" (02/28/2017)  . Type II diabetes mellitus ([place])     No family history on file.  SOCIAL HISTORY: Social History   Social History  . Marital status: Widowed    Spouse name: N/A  . Number of children: N/A  . Years of education: N/A   Occupational History  . Not on file.   Social History Main Topics  . Smoking status: Former Research scientist (life sciences)  . Smokeless tobacco: Never Used     Comment: 02/28/2017 "only smoked in the 1960s when we went out"  . Alcohol use No  . Drug use: No  . Sexual activity: No   Other Topics Concern  . Not on file   Social History Narrative  . No narrative on file    No Known  Allergies  No current facility-administered medications for this encounter.    Current Outpatient Prescriptions  Medication Sig Dispense Refill  . amiodarone (PACERONE) 100 MG tablet Take 1 tablet (100 mg total) by mouth daily. 30 tablet 0  . ferrous sulfate 325 (65 FE) MG tablet Take 1 tablet (325 mg total) by mouth 3 (three) times daily with meals. 90 tablet 0  . furosemide (LASIX) 40 MG tablet Take 1 tablet (40 mg total) by mouth daily. 30 tablet 0  . hydrALAZINE (APRESOLINE) 25 MG tablet Take 1.5 tablets (37.5 mg total) by mouth every 8 (eight) hours. 135 tablet 0  . isosorbide mononitrate (IMDUR) 30 MG 24 hr tablet Take 30 mg by mouth daily.  2  . LANTUS SOLOSTAR 100 UNIT/ML Solostar Pen Inject 56 Units into the skin at bedtime.   3  . losartan (COZAAR) 100 MG tablet Take 100 mg by mouth daily.  2  . pravastatin (PRAVACHOL) 40 MG tablet Take 40 mg by mouth daily.  2  . spironolactone (ALDACTONE) 25 MG tablet Take 0.5 tablets (12.5 mg total) by mouth daily. 30 tablet 0  . venlafaxine (EFFEXOR) 75 MG tablet Take 75 mg by mouth daily.     . VENTOLIN HFA 108 (90 Base) MCG/ACT inhaler Inhale 2 puffs into the lungs every 6 (six) hours as needed for wheezing or shortness of breath.   0  .  XARELTO 15 MG TABS tablet Take 15 mg by mouth daily.  4    REVIEW OF SYSTEMS:  Reviewed in her history and physical with nothing to add   PHYSICAL EXAM: Vitals:   03/08/17 1206 03/08/17 1301 03/08/17 1315 03/08/17 1331  BP: 130/70 (!) 144/116 (!) 125/57 (!) 100/51  Pulse: (!) 53 (!) 41 (!) 42 (!) 52  Resp: 16 16 (!) 22 18  Temp: 98.3 F (36.8 C)     TempSrc: Oral     SpO2: 99% 100% 98% 94%    GENERAL: The patient is a well-nourished female, in no acute distress. The vital signs are documented above. CARDIOVASCULAR: Expansile mass in her right radial artery. A significant hematoma from the level of the wrist up to the forearm below the antecubital space. No ischemia in her fingers. PULMONARY:  There is good air exchange  ABDOMEN: Soft and non-tender  MUSCULOSKELETAL: There are no major deformities or cyanosis. NEUROLOGIC: No focal weakness or paresthesias are detected. SKIN: There are no ulcers or rashes noted. PSYCHIATRIC: The patient has a normal affect.  DATA:  Duplex shows 2 cm false aneurysm at the radial artery puncture site  MEDICAL ISSUES: Stress with the patient and her family present. Patient is on Xarelto. Recommend surgical repair. Splane that this would be under local with sedation with a small incision and repair of the radial artery. Feel comfortable with her being discharged later today.   Rosetta Posner, MD FACS [facility] Tel [phone] Pager [phone]

## 2017-03-08 NOTE — Consult Note (Signed)
Advanced Heart Failure Team Consult Note   Primary Physician: Primary Cardiologist:    Reason for Consultation: Hematoma at cath site  HPI:    Shannon Lucas is seen today for evaluation of RUE hematoma at the request of Dr. Rex Kras in the Emergency department  Shannon Lucas is a 83 y.o. female with a history of PAF, Mi, DMII, PAF, and CKD Stage III admitted earlier this month with acute CHF. Diuresed with IV lasix and went for cath 03/06/17.   Hematoma noted that evening and pressure held. This improved and pt thought to be stable for discharge on 03/07/17.  Pt called Dr. Aundra Dubin office this am reporting worsening swelling and bruising of radial cath site over night.   Pt denies SOB or CP. Only complaint is swelling and mild discomfort at cath site.  Noticed bruising was much worse and hematoma size slightly worse when she woke up this am.  Denies bleeding. Denies numbness or tingling. Denies hand feeling "cold".  Review of Systems: [y] = yes, [ ]  = no   General: Weight gain [ ] ; Weight loss [ ] ; Anorexia [ ] ; Fatigue [y]; Fever [ ] ; Chills [ ] ; Weakness [ ]   Cardiac: Chest pain/pressure [ ] ; Resting SOB [ ] ; Exertional SOB [y]; Orthopnea [ ] ; Pedal Edema [ ] ; Palpitations [ ] ; Syncope [ ] ; Presyncope [ ] ; Paroxysmal nocturnal dyspnea[ ]   Pulmonary: Cough [ ] ; Wheezing[ ] ; Hemoptysis[ ] ; Sputum [ ] ; Snoring [ ]   GI: Vomiting[ ] ; Dysphagia[ ] ; Melena[ ] ; Hematochezia [ ] ; Heartburn[ ] ; Abdominal pain [ ] ; Constipation [ ] ; Diarrhea [ ] ; BRBPR [ ]   GU: Hematuria[ ] ; Dysuria [ ] ; Nocturia[ ]   Vascular: Pain in legs with walking [ ] ; Pain in feet with lying flat [ ] ; Non-healing sores [ ] ; Stroke [ ] ; TIA [ ] ; Slurred speech [ ] ;  Neuro: Headaches[ ] ; Vertigo[ ] ; Seizures[ ] ; Paresthesias[ ] ;Blurred vision [ ] ; Diplopia [ ] ; Vision changes [ ]   Ortho/Skin: Arthritis [y]; Joint pain [y]; Muscle pain [ ] ; Joint swelling [ ] ; Back Pain [ ] ; Rash [ ]   Psych: Depression[ ] ; Anxiety[ ]   Heme: Bleeding  problems [ ] ; Clotting disorders [ ] ; Anemia [ ]   Endocrine: Diabetes [ ] ; Thyroid dysfunction[ ]   Home Medications Prior to Admission medications   Medication Sig Start Date End Date Taking? Authorizing Provider  amiodarone (PACERONE) 100 MG tablet Take 1 tablet (100 mg total) by mouth daily. 03/08/17   Bufford Lope, DO  ferrous sulfate 325 (65 FE) MG tablet Take 1 tablet (325 mg total) by mouth 3 (three) times daily with meals. 03/07/17   Shannon Nigel Sloop, DO  furosemide (LASIX) 40 MG tablet Take 1 tablet (40 mg total) by mouth daily. 03/08/17   Shannon Nigel Sloop, DO  hydrALAZINE (APRESOLINE) 25 MG tablet Take 1.5 tablets (37.5 mg total) by mouth every 8 (eight) hours. 03/07/17 04/06/17  Bufford Lope, DO  isosorbide mononitrate (IMDUR) 30 MG 24 hr tablet Take 30 mg by mouth daily. 02/24/17   Historical Provider, MD  LANTUS SOLOSTAR 100 UNIT/ML Solostar Pen Inject 56 Units into the skin at bedtime.  02/24/17   Historical Provider, MD  losartan (COZAAR) 100 MG tablet Take 100 mg by mouth daily. 01/18/17   Historical Provider, MD  pravastatin (PRAVACHOL) 40 MG tablet Take 40 mg by mouth daily. 12/08/16   Historical Provider, MD  spironolactone (ALDACTONE) 25 MG tablet Take 0.5 tablets (12.5 mg total) by mouth daily.  03/08/17   Bufford Lope, DO  venlafaxine (EFFEXOR) 75 MG tablet Take 75 mg by mouth daily.  02/03/17   Historical Provider, MD  VENTOLIN HFA 108 (90 Base) MCG/ACT inhaler Inhale 2 puffs into the lungs every 6 (six) hours as needed for wheezing or shortness of breath.  02/13/17   Historical Provider, MD  XARELTO 15 MG TABS tablet Take 15 mg by mouth daily. 01/31/17   Historical Provider, MD    Past Medical History: Past Medical History:  Diagnosis Date  . A-fib ([place])   . Arthritis    "a little bit qwhere" (02/28/2017)  . CHF (congestive heart failure) ([place])   . CKD (chronic kidney disease), stage III    Shannon Lucas 02/28/2017  . Depression   . GERD (gastroesophageal reflux disease)   . High cholesterol   .  Hypertension   . Melanoma of back ([place])   . Myocardial infarction [facility])    "one dr said I'd had 1" (02/28/2017)  . Stroke [facility]) ~ 2010; ~2012   "affected the right side but I fully recovered; just a light one" (02/28/2017)  . Type II diabetes mellitus ([place])     Past Surgical History: Past Surgical History:  Procedure Laterality Date  . APPENDECTOMY    . KNEE SURGERY Left 1970s   "I have 1/3 of my knee left in there"  . LAPAROSCOPIC CHOLECYSTECTOMY    . MELANOMA EXCISION     "back"  . RIGHT/LEFT HEART CATH AND CORONARY ANGIOGRAPHY N/A 03/06/2017   Procedure: Right/Left Heart Cath and Coronary Angiography;  Surgeon: Shannon Dresser, MD;  Location: [facility];  Service: Cardiovascular;  Laterality: N/A;  . TUBAL LIGATION      Family History: No family history on file.  Social History: Social History   Social History  . Marital status: Widowed    Spouse name: N/A  . Number of children: N/A  . Years of education: N/A   Social History Main Topics  . Smoking status: Former Research scientist (life sciences)  . Smokeless tobacco: Never Used     Comment: 02/28/2017 "only smoked in the 1960s when we went out"  . Alcohol use No  . Drug use: No  . Sexual activity: No   Other Topics Concern  . None   Social History Narrative  . None    Allergies:  No Known Allergies  Objective:    Vital Signs:   Temp:  [98.3 F (36.8 C)] 98.3 F (36.8 C) (04/25 1206) Pulse Rate:  [53] 53 (04/25 1206) Resp:  [16] 16 (04/25 1206) BP: (130)/(70) 130/70 (04/25 1206) SpO2:  [99 %] 99 % (04/25 1206)    Weight change: There were no vitals filed for this visit.  Intake/Output:  No intake or output data in the 24 hours ending 03/08/17 1250   Physical Exam: General:  Elderly and well appearing. No resp difficulty HEENT: Normal Neck: Supple. JVP 7-8 cm. Carotids 2+ bilat; no bruits. No lymphadenopathy or thyromegaly appreciated. Cor: PMI nondisplaced. Regular rate & rhythm. No rubs, gallops or  murmurs. Lungs: Clear Abdomen: Soft, nontender, nondistended. No hepatosplenomegaly. No bruits or masses. Good bowel sounds. Extremities: No cyanosis, clubbing, or rash.  R radial site with 2+ pulse. Large area of ecchymosis extending up forearm with hematoma noted over radial portion of her R wrist.  Neuro: Alert & orientedx3, cranial nerves grossly intact. moves all 4 extremities w/o difficulty. Affect pleasant  Telemetry: Reviewed personally, NSR with rates in 50-60s. Occasional ectopy.   Labs:  Basic Metabolic Panel:  Recent Labs Lab 03/03/17 0408 03/04/17 0636 03/05/17 0514 03/06/17 0519 03/06/17 0939 03/07/17 0220  NA 137 136 140 141  --  138  K 3.9 4.4 4.2 4.1  --  4.7  CL 98* 101 108 110  --  104  CO2 25 25 24 23   --  25  GLUCOSE 121* 213* 147* 115*  --  190*  BUN 33* 34* 31* 29*  --  34*  CREATININE 2.12* 1.92* 1.76* 1.64* 1.70* 1.95*  CALCIUM 9.1 8.9 9.1 9.1  --  9.0    Liver Function Tests: No results for input(s): AST, ALT, ALKPHOS, BILITOT, PROT, ALBUMIN in the last 168 hours. No results for input(s): LIPASE, AMYLASE in the last 168 hours. No results for input(s): AMMONIA in the last 168 hours.  CBC:  Recent Labs Lab 03/02/17 0324 03/04/17 0636 03/06/17 0519 03/06/17 0939  WBC 6.7 7.8 7.5 7.3  HGB 9.3* 10.2* 9.4* 9.9*  HCT 30.2* 33.2* 30.6* 31.7*  MCV 85.6 86.5 86.9 87.8  PLT 209 243 241 220    Cardiac Enzymes: No results for input(s): CKTOTAL, CKMB, CKMBINDEX, TROPONINI in the last 168 hours.  BNP: BNP (last 3 results)  Recent Labs  02/28/17 1629  BNP 1,488.1*    ProBNP (last 3 results) No results for input(s): PROBNP in the last 8760 hours.   CBG:  Recent Labs Lab 03/06/17 1120 03/06/17 1710 03/06/17 2102 03/07/17 0729 03/07/17 1116  GLUCAP 158* 205* 205* 171* 191*    Coagulation Studies: No results for input(s): LABPROT, INR in the last 72 hours.  Other results:  Imaging:  No results found.   Medications:      Current Medications:    Infusions:     Assessment/Plan   Estefania Kamiya is a 83 y.o. female with a history of PAF, Mi, DMII, PAF, and CKD Stage III admitted earlier this month with acute CHF. Presented to [facility] for further evaluation of worsening RUE hematoma at radial cath site.   1. RUE hematoma s/p LHC 03/06/17 - Preliminary US findings + for pseudoaneurysm. Full report pending.  - Dr. Aundra Dubin discussed with Vascular surgeon, Dr. Donnetta Hutching.  He recommends placing TR band from cath lab and will come and assess the patient once finished with current OR case.  2. Chronic systolic CHF - Stable from recent admit.  - No change to her discharge medications at this time.  3. CKD stage III - Have repeat labs planned for later this week. If ends up staying in observation, will check in am prior to d/c.  4. PAF - In slow NSR by tele. Will follow.  - Continue amiodarone 100 mg daily.  5. Prolonged QT.  - Of note, pt should be OFF Effexor with QTC 507 on EKG 03/07/17 and QRS WNL (94 ms). Will discuss.  Will need to hold her Xarelto for the time being.    Awaiting further input from Dr. Donnetta Hutching with Vascular surgery for disposition.   Length of Stay: 0  Shannon Lucas  03/08/2017, 12:50 PM  Advanced Heart Failure Team Pager [phone] (M-F; 7a - 4p)  Please contact [facility] for night-coverage after hours (4p -7a ) and weekends on [email or web address]  Patient seen with PA, agree with the above note.  Patient had uncomplicated radial artery catheterization morning of 4/23.  Xarelto restarted > 8 hrs afterwards in the evening (for paroxysmal atrial fibrillation).  Overnight 4/23-24, she was noted to develop a hematoma, however compression was held  and this significantly improved.  On am of 4/24, patient had minimal hematoma and strong radial pulse.  She was felt to be stable for discharge on 4/24.  Patient woke up am 4/25 and noted worsening swelling again at right radial cath site.  She  was directed to come to the ER.    On exam, she was noted to have hematoma at cath site with pulsatility.  Ecchymosis extended up the forearm. Ultrasound of the radial area noted 2 cm pseudoaneurysm.  Vascular surgery (Dr Early) consulted, she will need surgical repair under local anesthesia.  Can hold Xarelto for several days following to allow healing.    Shannon Lucas 03/08/2017 2:36 PM

## 2017-03-08 NOTE — ED Provider Notes (Signed)
[facility] DEPT Provider Note   CSN: [id number] Arrival date & time: 03/08/17  1152     History   Chief Complaint Chief Complaint  Patient presents with  . Post-op Problem    HPI Shannon Lucas is a 83 y.o. female.  83yo F w/ PMH including CHF, CKD, A fib, CVA, T2DM who p/w R arm swelling and bruising. Pt underwent cardiac cath through R radial artery on 03/06/17. She was discharged yesterday doing well. This morning she noted increased bruising and swelling at cath site, which she reports is sore and tender to touch. No trauma. No numbness/tingling, no CP/SOB. She takes Xarelto for A fib.   The history is provided by the patient.    Past Medical History:  Diagnosis Date  . A-fib ([place])   . Arthritis    "a Hagop Mccollam bit qwhere" (02/28/2017)  . CHF (congestive heart failure) ([place])   . CKD (chronic kidney disease), stage III    [name] 02/28/2017  . Depression   . GERD (gastroesophageal reflux disease)   . High cholesterol   . Hypertension   . Melanoma of back ([place])   . Myocardial infarction [facility])    "one dr said I'd had 1" (02/28/2017)  . Stroke [facility]) ~ 2010; ~2012   "affected the right side but I fully recovered; just a light one" (02/28/2017)  . Type II diabetes mellitus [facility])     Patient Active Problem List   Diagnosis Date Noted  . Pseudoaneurysm following procedure ([place])   . Acute systolic congestive heart failure ([place])   . AKI (acute kidney injury) ([place])   . CHF exacerbation ([place]) 03/01/2017  . Fall   . Anemia   . Essential hypertension   . CKD (chronic kidney disease) stage 3, GFR 30-59 ml/min   . Diabetes mellitus type II, controlled ([place])   . Paroxysmal atrial fibrillation ([facility])   . Chronic anticoagulation   . Pure hypercholesterolemia   . Shortness of breath 02/28/2017    Past Surgical History:  Procedure Laterality Date  . APPENDECTOMY    . KNEE SURGERY Left 1970s   "I have 1/3 of my knee left in there"  . LAPAROSCOPIC CHOLECYSTECTOMY    . MELANOMA  EXCISION     "back"  . RIGHT/LEFT HEART CATH AND CORONARY ANGIOGRAPHY N/A 03/06/2017   Procedure: Right/Left Heart Cath and Coronary Angiography;  Surgeon: Larey Dresser, MD;  Location: [facility];  Service: Cardiovascular;  Laterality: N/A;  . TUBAL LIGATION      OB History    No data available       Home Medications    Prior to Admission medications   Medication Sig Start Date End Date Taking? Authorizing Provider  amiodarone (PACERONE) 100 MG tablet Take 1 tablet (100 mg total) by mouth daily. 03/08/17   Bufford Lope, DO  ferrous sulfate 325 (65 FE) MG tablet Take 1 tablet (325 mg total) by mouth 3 (three) times daily with meals. 03/07/17   Elsia Nigel Sloop, DO  furosemide (LASIX) 40 MG tablet Take 1 tablet (40 mg total) by mouth daily. 03/08/17   Elsia Nigel Sloop, DO  hydrALAZINE (APRESOLINE) 25 MG tablet Take 1.5 tablets (37.5 mg total) by mouth every 8 (eight) hours. 03/07/17 04/06/17  Bufford Lope, DO  isosorbide mononitrate (IMDUR) 30 MG 24 hr tablet Take 30 mg by mouth daily. 02/24/17   Historical Provider, MD  LANTUS SOLOSTAR 100 UNIT/ML Solostar Pen Inject 56 Units into the skin at bedtime.  02/24/17   Historical Provider, MD  losartan (COZAAR) 100 MG tablet Take 100 mg by mouth daily. 01/18/17   Historical Provider, MD  pravastatin (PRAVACHOL) 40 MG tablet Take 40 mg by mouth daily. 12/08/16   Historical Provider, MD  spironolactone (ALDACTONE) 25 MG tablet Take 0.5 tablets (12.5 mg total) by mouth daily. 03/08/17   Bufford Lope, DO  venlafaxine (EFFEXOR) 75 MG tablet Take 75 mg by mouth daily.  02/03/17   Historical Provider, MD  VENTOLIN HFA 108 (90 Base) MCG/ACT inhaler Inhale 2 puffs into the lungs every 6 (six) hours as needed for wheezing or shortness of breath.  02/13/17   Historical Provider, MD  XARELTO 15 MG TABS tablet Take 15 mg by mouth daily. 01/31/17   Historical Provider, MD    Family History No family history on file.  Social History Social History  Substance Use Topics    . Smoking status: Former Research scientist (life sciences)  . Smokeless tobacco: Never Used     Comment: 02/28/2017 "only smoked in the 1960s when we went out"  . Alcohol use No     Allergies   Patient has no known allergies.   Review of Systems Review of Systems All other systems reviewed and are negative except that which was mentioned in HPI  Physical Exam Updated Vital Signs BP (!) 132/57   Pulse (!) 44   Temp 98.3 F (36.8 C) (Oral)   Resp 17   SpO2 96%   Physical Exam  Constitutional: She is oriented to person, place, and time. She appears well-developed and well-nourished. No distress.  HENT:  Head: Normocephalic and atraumatic.  Moist mucous membranes  Eyes: Conjunctivae are normal. Pupils are equal, round, and reactive to light.  Neck: Neck supple.  Cardiovascular: Regular rhythm, normal heart sounds and intact distal pulses.  Bradycardia present.   No murmur heard. Pulmonary/Chest: Effort normal and breath sounds normal.  Abdominal: Soft. Bowel sounds are normal. She exhibits no distension. There is no tenderness.  Musculoskeletal: She exhibits edema and tenderness.  Ecchymosis volar distal R forearm with pulsatile, tender mass over R radial artery at wrist; normal sensation hand  Neurological: She is alert and oriented to person, place, and time. No sensory deficit.  Fluent speech  Skin: Skin is warm and dry.  Large ecchymosis volar R forearm extending onto hand  Psychiatric: She has a normal mood and affect. Judgment normal.  Nursing note and vitals reviewed.    ED Treatments / Results  Labs (all labs ordered are listed, but only abnormal results are displayed) Labs Reviewed  CBC  I-STAT CHEM 8, ED    EKG  EKG Interpretation None       Radiology No results found.  Procedures Procedures (including critical care time)  Medications Ordered in ED Medications - No data to display   Initial Impression / Assessment and Plan / ED Course  I have reviewed the triage  vital signs and the nursing notes.  Pertinent imaging results that were available during my care of the patient were reviewed by me and considered in my medical decision making (see chart for details).     PT w/ recent heart cath through R radial artery p/w worsening swelling and bruising at site. She was comfortable on exam, pulsatile mass noted. Discussed w/ Dr. Aundra Dubin. Ordered vascular US which shows pseudoaneurysm. Contacted vascular surgery, Dr. Donnetta Hutching, who will take pt to OR to repair. I appreciate Dr. Aundra Dubin and Dr. Luther Parody assistance. Pt understands plan.  Final Clinical  Impressions(s) / ED Diagnoses   Final diagnoses:  Pseudoaneurysm following procedure [facility])    New Prescriptions New Prescriptions   No medications on file     Sharlett Iles, MD 03/08/17 1438

## 2017-03-08 NOTE — Op Note (Signed)
    OPERATIVE REPORT  DATE OF SURGERY: 03/08/2017  PATIENT: Shannon Lucas, 83 y.o. female MRN: [id number]  DOB: 09-Jun-1931  PRE-OPERATIVE DIAGNOSIS: Right radial false aneurysm following cardiac catheterization  POST-OPERATIVE DIAGNOSIS:  Same  PROCEDURE: Repair of right radial pulse aneurysm  SURGEON:  Curt Jews, M.D.  PHYSICIAN ASSISTANT: Nurse  ANESTHESIA:  Local with sedation  EBL: Minimal ml  No intake/output data recorded.  BLOOD ADMINISTERED: None  DRAINS: None  SPECIMEN: None  COUNTS CORRECT:  YES  PLAN OF CARE: PACU   PATIENT DISPOSITION:  PACU - hemodynamically stable  PROCEDURE DETAILS: The patient was taken to the operating room and placed in supine position where the area of the right arm was prepped and draped in the usual sterile fashion    M incision was made over the false aneurysm at the puncture site. A pneumatic tourniquet and the upper arm and inflated to 200 mmHg. Incision was made over an extended down into the false aneurysm. The radial artery was dissected free proximal distal to the arteriotomy puncture site. The artery was closed with a 60 figure-of-eight suture. Pneumatic tourniquet was deflated. One additional suture was required for hemostasis. Wound was irrigated with saline. Hemostasis obtained with electrocautery. Wound was closed with 3-0 Vicryl and the subcutaneous and subcuticular tissue. Sterile dressing was applied and the patient was taken to read covering in stable condition 03/08/2017 4:55 PM

## 2017-03-09 ENCOUNTER — Telehealth: Payer: Self-pay | Admitting: Vascular Surgery

## 2017-03-09 ENCOUNTER — Encounter (HOSPITAL_COMMUNITY): Payer: Self-pay | Admitting: Vascular Surgery

## 2017-03-09 NOTE — Telephone Encounter (Signed)
Scheduled 5/29 @ 9:45am

## 2017-03-09 NOTE — Telephone Encounter (Signed)
-----   Message from Mena Goes, RN sent at 03/09/2017 12:17 PM EDT ----- Regarding: 4-6 weeks    ----- Message ----- From: Rosetta Posner, MD Sent: 03/09/2017  11:49 AM To: [facility] Charge Pool  Repair of right radial false aneurysm. Asst. nurse. Needs follow-up with me in several weeks without ultrasound

## 2017-03-10 ENCOUNTER — Ambulatory Visit (HOSPITAL_COMMUNITY)
Admit: 2017-03-10 | Discharge: 2017-03-10 | Disposition: A | Discharge status: Home / Self Care | Payer: Medicare Other | Source: Ambulatory Visit | Attending: Internal Medicine | Admitting: Internal Medicine

## 2017-03-10 DIAGNOSIS — I5023 Acute on chronic systolic (congestive) heart failure: Secondary | ICD-10-CM | POA: Diagnosis present

## 2017-03-10 LAB — BASIC METABOLIC PANEL
Anion gap: 8 (ref 5–15)
BUN: 31 mg/dL — AB (ref 6–20)
CHLORIDE: 110 mmol/L (ref 101–111)
CO2: 22 mmol/L (ref 22–32)
Calcium: 9.7 mg/dL (ref 8.9–10.3)
Creatinine, Ser: 1.89 mg/dL — ABNORMAL HIGH (ref 0.44–1.00)
GFR calc Af Amer: 27 mL/min — ABNORMAL LOW (ref >60)
GFR calc non Af Amer: 23 mL/min — ABNORMAL LOW (ref >60)
Glucose, Bld: 106 mg/dL — ABNORMAL HIGH (ref 65–99)
Potassium: 4.3 mmol/L (ref 3.5–5.1)
SODIUM: 140 mmol/L (ref 135–145)

## 2017-03-15 ENCOUNTER — Ambulatory Visit (HOSPITAL_BASED_OUTPATIENT_CLINIC_OR_DEPARTMENT_OTHER)
Admission: RE | Admit: 2017-03-15 | Discharge: 2017-03-15 | Disposition: A | Discharge status: Home / Self Care | Payer: Medicare Other | Source: Ambulatory Visit | Attending: Student | Admitting: Student

## 2017-03-15 ENCOUNTER — Encounter: Payer: Self-pay | Admitting: Vascular Surgery

## 2017-03-15 ENCOUNTER — Ambulatory Visit (HOSPITAL_COMMUNITY)
Admission: RE | Admit: 2017-03-15 | Discharge: 2017-03-15 | Disposition: A | Discharge status: Home / Self Care | Payer: Medicare Other | Source: Ambulatory Visit | Attending: Internal Medicine | Admitting: Internal Medicine

## 2017-03-15 VITALS — BP 138/64 | HR 98 | Wt 202.4 lb

## 2017-03-15 DIAGNOSIS — F329 Major depressive disorder, single episode, unspecified: Secondary | ICD-10-CM | POA: Diagnosis not present

## 2017-03-15 DIAGNOSIS — N183 Chronic kidney disease, stage 3 unspecified: Secondary | ICD-10-CM

## 2017-03-15 DIAGNOSIS — Z794 Long term (current) use of insulin: Secondary | ICD-10-CM | POA: Diagnosis not present

## 2017-03-15 DIAGNOSIS — E785 Hyperlipidemia, unspecified: Secondary | ICD-10-CM | POA: Insufficient documentation

## 2017-03-15 DIAGNOSIS — I480 Paroxysmal atrial fibrillation: Secondary | ICD-10-CM

## 2017-03-15 DIAGNOSIS — K219 Gastro-esophageal reflux disease without esophagitis: Secondary | ICD-10-CM | POA: Diagnosis not present

## 2017-03-15 DIAGNOSIS — Z7901 Long term (current) use of anticoagulants: Secondary | ICD-10-CM | POA: Diagnosis not present

## 2017-03-15 DIAGNOSIS — I5022 Chronic systolic (congestive) heart failure: Secondary | ICD-10-CM

## 2017-03-15 DIAGNOSIS — I10 Essential (primary) hypertension: Secondary | ICD-10-CM | POA: Diagnosis not present

## 2017-03-15 DIAGNOSIS — I130 Hypertensive heart and chronic kidney disease with heart failure and stage 1 through stage 4 chronic kidney disease, or unspecified chronic kidney disease: Secondary | ICD-10-CM | POA: Insufficient documentation

## 2017-03-15 DIAGNOSIS — I729 Aneurysm of unspecified site: Secondary | ICD-10-CM

## 2017-03-15 DIAGNOSIS — T81718A Complication of other artery following a procedure, not elsewhere classified, initial encounter: Secondary | ICD-10-CM

## 2017-03-15 DIAGNOSIS — E1122 Type 2 diabetes mellitus with diabetic chronic kidney disease: Secondary | ICD-10-CM | POA: Insufficient documentation

## 2017-03-15 DIAGNOSIS — Z8673 Personal history of transient ischemic attack (TIA), and cerebral infarction without residual deficits: Secondary | ICD-10-CM | POA: Diagnosis not present

## 2017-03-15 DIAGNOSIS — I429 Cardiomyopathy, unspecified: Secondary | ICD-10-CM | POA: Diagnosis not present

## 2017-03-15 DIAGNOSIS — Z992 Dependence on renal dialysis: Secondary | ICD-10-CM | POA: Diagnosis not present

## 2017-03-15 DIAGNOSIS — I252 Old myocardial infarction: Secondary | ICD-10-CM | POA: Insufficient documentation

## 2017-03-15 DIAGNOSIS — D649 Anemia, unspecified: Secondary | ICD-10-CM

## 2017-03-15 DIAGNOSIS — Z87891 Personal history of nicotine dependence: Secondary | ICD-10-CM | POA: Insufficient documentation

## 2017-03-15 DIAGNOSIS — I9789 Other postprocedural complications and disorders of the circulatory system, not elsewhere classified: Secondary | ICD-10-CM

## 2017-03-15 DIAGNOSIS — Z79899 Other long term (current) drug therapy: Secondary | ICD-10-CM | POA: Diagnosis not present

## 2017-03-15 DIAGNOSIS — E7800 Pure hypercholesterolemia, unspecified: Secondary | ICD-10-CM | POA: Insufficient documentation

## 2017-03-15 DIAGNOSIS — Z85820 Personal history of malignant melanoma of skin: Secondary | ICD-10-CM | POA: Insufficient documentation

## 2017-03-15 DIAGNOSIS — N186 End stage renal disease: Secondary | ICD-10-CM | POA: Diagnosis not present

## 2017-03-15 DIAGNOSIS — I5023 Acute on chronic systolic (congestive) heart failure: Secondary | ICD-10-CM | POA: Insufficient documentation

## 2017-03-15 MED ORDER — XARELTO 15 MG PO TABS: 15.0000 mg | ORAL_TABLET | Freq: Every day | ORAL | 4 refills | Status: DC

## 2017-03-15 MED ORDER — SPIRONOLACTONE 25 MG PO TABS: 25.0000 mg | ORAL_TABLET | Freq: Every day | ORAL | 6 refills | Status: DC

## 2017-03-15 NOTE — Progress Notes (Signed)
Advanced Heart Failure Medication Review by a Pharmacist  Does the patient  feel that his/her medications are working for him/her?  yes  Has the patient been experiencing any side effects to the medications prescribed?  no  Does the patient measure his/her own blood pressure or blood glucose at home?  yes   Does the patient have any problems obtaining medications due to transportation or finances?   no  Understanding of regimen: good Understanding of indications: good Potential of compliance: good Patient understands to avoid NSAIDs. Patient understands to avoid decongestants.  Issues to address at subsequent visits: None   Pharmacist comments: Ms. Overbaugh is a pleasant 83 yo F presenting with her granddaughter and a medication list. She reports good compliance with her regimen and did not have any specific medication-related questions or concerns for me at this time.   Ruta Hinds. Velva Harman, PharmD, BCPS, CPP Clinical Pharmacist Pager: [phone] Phone: [phone] 03/15/2017 11:15 AM     Time with patient: 10 minutes Preparation and documentation time: 2 minutes Total time: 12 minutes

## 2017-03-15 NOTE — Progress Notes (Unsigned)
Asked to see pt by the heart failure clinic for increased swelling left wrist.  Pt had repair of left radial artery pseudoaneurysm by Dr Early about 1 week ago.  She said the swelling has slowly progressed over the last several days.  She denies any bleeding.  She has no numbness or tingling in her hand.  Currently her Xarelto is on hold  PE:  Left upper extremity: 3 x 2 cm mass without pulse erythema or drainage.  Non fluctuant.   Some ecchymosis in forearm. No real pain on palpation.  No numbness or tingling in hand.  2+ radial pulse distal to incision  Duplex ordered by Cardiology shows possible trickle of flow vs side branch of radial artery  A: s/p repair of radial psuedoaneurysm.  Mass currently not causing skin compromise or significant pain.  It is not currently large enough that warrants return to OR  P: Will move follow up visit with Dr Donnetta Hutching to next week instead of May 29.  Ruta Hinds, MD [facility] Office: [phone] Pager: [phone]

## 2017-03-15 NOTE — Progress Notes (Addendum)
Advanced Heart Failure Clinic Note   Primary Care: [facility], [place] Primary Cardiologist: Dr Otho Perl [facility])  HPI:  Shannon Lucas is a 83 y.o. female with PMH of Systolic CHF,  PAF on xarelto, CKD stage III, DMII, GERD, HLD, CVA, anemia, and HF.   Admitted 4/17 -> 2/95/28 with A/C systolic CHF. L/RHC as below with normal coronaries and elevated filling pressures. Diuresed 5 lbs. Hospital course complicated by hematoma at radial cath site that improved overnight. Medications adjusted as tolerated. Discharge weight 205 lbs.    Pt seen in ED 03/08/17 for worsening of hematoma as above.  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 Dr. Donnetta Hutching saw in consult and repaired under local anaesthesia and sedation.   She presents today for post hospital follow up. Breathing has been a lot better since admission. Gets around the house fine. Went to [place], [place] this weekend and was able to walk around a little bit. Sleeps on 2 pillows chronically. No longer orthopneic or bendopneic. R radial site has been slightly sore, but much worse again overnight. Felt throbbing.She denies any traumatic event that she is aware of. Taking all medications as directed. Hasn't needed any lasix. Weight at home 200 lbs. Denies lightheadedness or dizziness.   Review of systems complete and found to be negative unless listed in HPI.    PMH  1. Systolic CHF due to NICM - Echo 03/01/17 LVEF 25-30%, At least mild AS, Mild MR, Mod LAE, Mod RAE, Trivial PI, PA peak pressure 42 mm Hg. 2. PAF on Xarelto 3. CKD Stage III 4. DMII 5. GERD 6. HLD 7. Hx of CVA - ?2011-2012 with no lasting deficit 8. Chronic anemia   L/RHC 03/06/17 Dominance: Co-dominant  Left Main  Separate ostia LAD and LCx.  Left Anterior Descending  Luminal irregularities in the LAD. Small to moderate D1 with 70% ostial stenosis. Small to moderate D2 with 50% ostial stenosis.  Left Circumflex  Large caliber vessel. Small to moderate OM1 with 40% ostial stenosis.  Right  Coronary Artery  No significant disease. Relatively small vessel.   Right Heart Pressures RHC Procedural Findings: Hemodynamics (mmHg) RA mean 10 RV 74/14 PA 78/28, mean 44 PCWP mean 25 LV 165/25 AO 146/54 Oxygen saturations: PA 70% AO 99% Cardiac Output (Fick) 7.21  Cardiac Index (Fick) 3.59 PVR 2.65 WU      Past Medical History:  Diagnosis Date  . A-fib ([place])   . Arthritis    "a little bit qwhere" (02/28/2017)  . CHF (congestive heart failure) ([place])   . CKD (chronic kidney disease), stage III    [name] 02/28/2017  . Depression   . GERD (gastroesophageal reflux disease)   . High cholesterol   . Hypertension   . Melanoma of back ([place])   . Myocardial infarction [facility])    "one dr said I'd had 1" (02/28/2017)  . Stroke [facility]) ~ 2010; ~2012   "affected the right side but I fully recovered; just a light one" (02/28/2017)  . Type II diabetes mellitus ([place])     Current Outpatient Prescriptions  Medication Sig Dispense Refill  . amiodarone (PACERONE) 100 MG tablet Take 1 tablet (100 mg total) by mouth daily. 30 tablet 0  . ferrous sulfate 325 (65 FE) MG tablet Take 1 tablet (325 mg total) by mouth 3 (three) times daily with meals. 90 tablet 0  . furosemide (LASIX) 40 MG tablet Take 1 tablet (40 mg total) by mouth daily. 30 tablet 0  . hydrALAZINE (APRESOLINE) 25 MG  tablet Take 1.5 tablets (37.5 mg total) by mouth every 8 (eight) hours. 135 tablet 0  . isosorbide mononitrate (IMDUR) 30 MG 24 hr tablet Take 30 mg by mouth daily.  2  . LANTUS SOLOSTAR 100 UNIT/ML Solostar Pen Inject 56 Units into the skin at bedtime.   3  . losartan (COZAAR) 100 MG tablet Take 100 mg by mouth daily.  2  . pravastatin (PRAVACHOL) 40 MG tablet Take 40 mg by mouth daily.  2  . spironolactone (ALDACTONE) 25 MG tablet Take 0.5 tablets (12.5 mg total) by mouth daily. 30 tablet 0  . venlafaxine (EFFEXOR) 75 MG tablet Take 75 mg by mouth daily.     . VENTOLIN HFA 108 (90 Base) MCG/ACT inhaler Inhale 2  puffs into the lungs every 6 (six) hours as needed for wheezing or shortness of breath.   0  . XARELTO 15 MG TABS tablet Take 15 mg by mouth daily.  4   No current facility-administered medications for this encounter.    No Known Allergies   Social History   Social History  . Marital status: Widowed    Spouse name: N/A  . Number of children: N/A  . Years of education: N/A   Occupational History  . Not on file.   Social History Main Topics  . Smoking status: Former Research scientist (life sciences)  . Smokeless tobacco: Never Used     Comment: 02/28/2017 "only smoked in the 1960s when we went out"  . Alcohol use No  . Drug use: No  . Sexual activity: No   Other Topics Concern  . Not on file   Social History Narrative  . No narrative on file   Family history No family history of premature CAD or CHF.   Vitals:   03/15/17 1047  BP: 138/64  Pulse: 98  SpO2: 96%  Weight: 202 lb 6.4 oz (91.8 kg)   Wt Readings from Last 3 Encounters:  03/15/17 202 lb 6.4 oz (91.8 kg)  03/07/17 205 lb 6.4 oz (93.2 kg)     PHYSICAL EXAM: General  Elderly and fatigued appearing.  HEENT: Normal Neck: supple. JVP 6-7 cm Carotids 2+ bilat; no bruits. No lymphadenopathy or thyromegaly appreciated. Cor: PMI nondisplaced. Regular with occasional ectopy. No rubs, gallops or murmurs. Lungs: Clear Abdomen: Soft, nontender, nondistended. No hepatosplenomegaly. No bruits or masses. Good bowel sounds. Extremities: No cyanosis or clubbing. R radial area with large area of ecchymosis. 3x2 cm mass without pulse, erythema, or drainage. Neuro: Alert & oriented x 3, cranial nerves grossly intact. moves all 4 extremities w/o difficulty. Affect pleasant.  ECG personally reviewed, Appears to be in NSR with PACs. Appears to have p waves. HR 85 bpm  ASSESSMENT & PLAN:  1. Chronic systolic HF.  - Echo 32-35% 02/2017. NICM by cath.  - NYHA II - III currently.  - Volume status looks OK on exam - Continue lasix 40 mg daily -  Increase spiro to 25 mg daily  - Holding off on BB with bradycardia.  - Continue hydralazine 37.5 mg TID - Continue imdur 30 mg daily - Reinforced fluid restriction to < 2 L daily, sodium restriction to less than 2000 mg daily, and the importance of daily weights.   2. CKD Stage III - Stable on recheck in ED. Recheck 10 days with Increased spiro.  3. PAF - ECG today shows NSR - Continue amio 100 mg daily.  - Resume Xarelto once stable from surgical s/p radial pseudoaneurysm.  4. DMII:  -  Per PCP 5. Anemia:  - Started on [place] recent admission. Per PCP  6. R radial pseudoaneurysm - Worse today. Will send to stat Arterial duplex.  - Have also paged Dr. Donnetta Hutching.  - Will need to resume Xarelto once this is stable.   Increase spiro as above.  Further R radial site work up pending.   Addendum:   Dr. Oneida Alar saw in office. No skin compromise or significant pain. Duplex US with possible trickle of flow vs side branch of radial artery. Vascular f/u moved to next week from 04/11/17. Xarelto remains on hold at this time until resolved.   Shannon Friar, PA-C 03/15/17    Patient seen and examined with the above-signed Advanced Practice Provider and/or Housestaff. I personally reviewed laboratory data, imaging studies and relevant notes. I independently examined the patient and formulated the important aspects of the plan. I have edited the note to reflect any of my changes or salient points. I have personally discussed the plan with the patient and/or family.  Stable from HF perspective. Agree with increasing spiro.   Still struggling with pain and swelling and R radial site despite PSA repair. Both her and her daughter have multiple questions but mechanism of injury to site and ask if someone did something wrong. Explained that PSA is a well-recognized complication of cardiac catheterization and can occur even if cases that go smoothly.   U/s obtained in clinic and reviewed. No clear  recurrent PSA despite ongoing swelling and pain. Dr. Oneida Alar from [facility] came to [facility] and examined patient and discussed with her and her daughter. They will follow her closely.   Greater than 50% of the 25 minute visit was spent in counseling/coordination of care with [facility], medication reconciliation, salt/fluid restriction, and arranging procedures.   Glori Bickers, MD  11:38 PM

## 2017-03-15 NOTE — Addendum Note (Signed)
Encounter addended by: Jolaine Artist, MD on: 03/15/2017 11:38 PM<BR>    Actions taken: LOS modified, Sign clinical note

## 2017-03-15 NOTE — Progress Notes (Signed)
**  Preliminary report by tech**  Right radial artery pseudoaneurysm surveillance complete. A branch off of the radial artery extending superficially was noted at the incision site. No pseudoaneurysm was noted. Results were given to Dr. Oneida Alar.   03/15/17 12:24 PM Shannon Lucas RVT

## 2017-03-15 NOTE — Patient Instructions (Addendum)
Refilled xarelto.  INCREASE Spironolactone to 25 mg (1 whole tablet) once daily.  Return in 1-2 weeks for repeat labs.  Follow up 4 weeks.  Do the following things EVERYDAY: 1) Weigh yourself in the morning before breakfast. Write it down and keep it in a log. 2) Take your medicines as prescribed 3) Eat low salt foods-Limit salt (sodium) to 2000 mg per day.  4) Stay as active as you can everyday 5) Limit all fluids for the day to less than 2 liters

## 2017-03-16 ENCOUNTER — Telehealth: Payer: Self-pay | Admitting: Vascular Surgery

## 2017-03-16 NOTE — Telephone Encounter (Signed)
-----   Message from Mena Goes, RN sent at 03/15/2017  1:44 PM EDT ----- Regarding: move up appt   ----- Message ----- From: Elam Dutch, MD Sent: 03/15/2017  12:48 PM To: [facility] Charge Pool  Please move this pt follow up with Dr Donnetta Hutching to next week or with PA on Dr Luther Parody office day next week  Ruta Hinds, MD [facility] Office: [phone] Pager: [phone]

## 2017-03-16 NOTE — Telephone Encounter (Signed)
Sched appt 03/21/17 2:00 w/ PA. Lm on hm# to inform pt of appt.

## 2017-03-21 ENCOUNTER — Ambulatory Visit (INDEPENDENT_AMBULATORY_CARE_PROVIDER_SITE_OTHER): Payer: Self-pay | Admitting: Vascular Surgery

## 2017-03-21 VITALS — BP 124/74 | HR 83 | Temp 97.1°F | Resp 16 | Ht 65.5 in | Wt 200.0 lb

## 2017-03-21 DIAGNOSIS — L7682 Other postprocedural complications of skin and subcutaneous tissue: Secondary | ICD-10-CM

## 2017-03-21 NOTE — Progress Notes (Signed)
Vascular surgery thought ok to resume Xarelto. Please make sure that she restarts.

## 2017-03-21 NOTE — Progress Notes (Signed)
Patient name: Shannon Lucas MRN: [id number] DOB: [birth year] Sex: female  REASON FOR VISIT: Postop  HPI: Shannon Lucas is a 83 y.o. female who presents for postoperative evaluation status post repair of right radial artery pseudoaneurysm on 03/08/2017 by Dr. Donnetta Hutching. She had undergone cardiac catheterization via the right radial artery the day before. She was last seen by Dr. Oneida Alar on 03/15/2017 for increased swelling of the left wrist. Duplex by cardiology showed a branch off of the radial artery extending superficially. No pseudoaneurysm seen.  The patient still has some pain around her incision. She denies any pain in her right hand. Swelling around her incision has remained stable. Ecchymosis of her right forearm has improved. Her Xarelto has been held pending this evaluation.  Current Outpatient Prescriptions  Medication Sig Dispense Refill  . amiodarone (PACERONE) 100 MG tablet Take 1 tablet (100 mg total) by mouth daily. 30 tablet 0  . ferrous sulfate 325 (65 FE) MG tablet Take 1 tablet (325 mg total) by mouth 3 (three) times daily with meals. 90 tablet 0  . furosemide (LASIX) 40 MG tablet Take 1 tablet (40 mg total) by mouth daily. 30 tablet 0  . hydrALAZINE (APRESOLINE) 25 MG tablet Take 1.5 tablets (37.5 mg total) by mouth every 8 (eight) hours. 135 tablet 0  . isosorbide mononitrate (IMDUR) 30 MG 24 hr tablet Take 30 mg by mouth daily.  2  . LANTUS SOLOSTAR 100 UNIT/ML Solostar Pen Inject 56 Units into the skin at bedtime.   3  . losartan (COZAAR) 100 MG tablet Take 100 mg by mouth daily.  2  . pravastatin (PRAVACHOL) 40 MG tablet Take 40 mg by mouth daily.  2  . spironolactone (ALDACTONE) 25 MG tablet Take 1 tablet (25 mg total) by mouth daily. 30 tablet 6  . venlafaxine (EFFEXOR) 75 MG tablet Take 75 mg by mouth daily.     . VENTOLIN HFA 108 (90 Base) MCG/ACT inhaler Inhale 2 puffs into the lungs every 6 (six) hours as needed for wheezing or shortness of breath.   0  . XARELTO 15 MG  TABS tablet Take 1 tablet (15 mg total) by mouth daily. (Patient not taking: Reported on 03/21/2017) 42 tablet 4   No current facility-administered medications for this visit.     REVIEW OF SYSTEMS:  [X]  denotes positive finding, [ ]  denotes negative finding Cardiac  Comments:  Chest pain or chest pressure:    Shortness of breath upon exertion:    Short of breath when lying flat:    Irregular heart rhythm:    Constitutional    Fever or chills:      PHYSICAL EXAM: Vitals:   03/21/17 1425  BP: 124/74  Pulse: 83  Resp: 16  Temp: 97.1 F (36.2 C)  SpO2: 98%  Weight: 200 lb (90.7 kg)  Height: 5' 5.5" (1.664 m)    GENERAL: The patient is a well-nourished female, in no acute distress. The vital signs are documented above. VASCULAR: 2+ right radial and ulnar pulses. There is a small mass around the right wrist incision. No pulsatility. No skin changes. Ecchymosis of her right forearm has significantly improved. Right hand is warm with intact grip strength.  MEDICAL ISSUES: s/p repair of radial pseudoaneurysm  Probable hematoma around right radial incision. This has been stable since last evaluation on 03/15/2017. This is not pulsatile. There is no skin compromise around this area. Normal radial and ulnar pulses. Okay to resume anticoagulation. No plans for any surgical intervention.  She will follow-up as needed.  Virgina Jock, PA-C [facility] MD: Early

## 2017-03-29 ENCOUNTER — Ambulatory Visit (HOSPITAL_COMMUNITY)
Admission: RE | Admit: 2017-03-29 | Discharge: 2017-03-29 | Disposition: A | Discharge status: Home / Self Care | Payer: Medicare Other | Source: Ambulatory Visit | Attending: Internal Medicine | Admitting: Internal Medicine

## 2017-03-29 DIAGNOSIS — I5022 Chronic systolic (congestive) heart failure: Secondary | ICD-10-CM | POA: Diagnosis not present

## 2017-03-29 LAB — BASIC METABOLIC PANEL
Anion gap: 9 (ref 5–15)
BUN: 45 mg/dL — AB (ref 6–20)
CHLORIDE: 107 mmol/L (ref 101–111)
CO2: 22 mmol/L (ref 22–32)
Calcium: 9.6 mg/dL (ref 8.9–10.3)
Creatinine, Ser: 2.47 mg/dL — ABNORMAL HIGH (ref 0.44–1.00)
GFR calc non Af Amer: 17 mL/min — ABNORMAL LOW (ref >60)
GFR, EST AFRICAN AMERICAN: 20 mL/min — AB (ref >60)
Glucose, Bld: 112 mg/dL — ABNORMAL HIGH (ref 65–99)
POTASSIUM: 5.2 mmol/L — AB (ref 3.5–5.1)
SODIUM: 138 mmol/L (ref 135–145)

## 2017-04-05 ENCOUNTER — Telehealth (HOSPITAL_COMMUNITY): Payer: Self-pay | Admitting: *Deleted

## 2017-04-05 MED ORDER — FUROSEMIDE 40 MG PO TABS: 40.0000 mg | ORAL_TABLET | Freq: Every day | ORAL | 6 refills | Status: DC

## 2017-04-05 NOTE — Telephone Encounter (Signed)
Notes recorded by Scarlette Calico, RN on 04/05/2017 at 10:18 AM EDT Spoke w/pt, she states wt has been stable over past week or so, denies any dizziness/lightheadedness. She will stop Arlyce Harman and hold Lasix for 2 days, she is sch for f/u appt on 5/30 will repeat labs at that time ------  Notes recorded by Kerry Dory, CMA on 04/04/2017 at 4:39 PM EDT Left message for patient to call back.[phone]  ------  Notes recorded by Kerry Dory, CMA on 03/29/2017 at 3:11 PM EDT Left message for patient to call back.V:[phone]  ------  Notes recorded by Shirley Friar, PA-C on 03/29/2017 at 2:46 PM EDT Hold lasix x 2 days as long as weight stable. If weight down, or lightheaded, should hold for 3 days. Stop spironolactone for now. Recheck BMET early next week.

## 2017-04-05 NOTE — Telephone Encounter (Signed)
Larey Dresser, MD (Physician)    Vascular surgery thought ok to resume Xarelto. Please make sure that she restarts.      Pt is aware and states she has restarted Xarelto

## 2017-04-11 ENCOUNTER — Encounter: Payer: Medicare Other | Admitting: Vascular Surgery

## 2017-04-12 ENCOUNTER — Encounter (HOSPITAL_COMMUNITY): Payer: Self-pay

## 2017-04-12 ENCOUNTER — Ambulatory Visit (HOSPITAL_COMMUNITY)
Admission: RE | Admit: 2017-04-12 | Discharge: 2017-04-12 | Disposition: A | Discharge status: Home / Self Care | Payer: Medicare Other | Source: Ambulatory Visit | Attending: Internal Medicine | Admitting: Internal Medicine

## 2017-04-12 VITALS — BP 166/92 | HR 98 | Wt 201.6 lb

## 2017-04-12 DIAGNOSIS — I130 Hypertensive heart and chronic kidney disease with heart failure and stage 1 through stage 4 chronic kidney disease, or unspecified chronic kidney disease: Secondary | ICD-10-CM | POA: Insufficient documentation

## 2017-04-12 DIAGNOSIS — I480 Paroxysmal atrial fibrillation: Secondary | ICD-10-CM | POA: Insufficient documentation

## 2017-04-12 DIAGNOSIS — E7800 Pure hypercholesterolemia, unspecified: Secondary | ICD-10-CM | POA: Diagnosis not present

## 2017-04-12 DIAGNOSIS — I5022 Chronic systolic (congestive) heart failure: Secondary | ICD-10-CM

## 2017-04-12 DIAGNOSIS — M1990 Unspecified osteoarthritis, unspecified site: Secondary | ICD-10-CM | POA: Diagnosis not present

## 2017-04-12 DIAGNOSIS — Z8673 Personal history of transient ischemic attack (TIA), and cerebral infarction without residual deficits: Secondary | ICD-10-CM | POA: Insufficient documentation

## 2017-04-12 DIAGNOSIS — N183 Chronic kidney disease, stage 3 unspecified: Secondary | ICD-10-CM

## 2017-04-12 DIAGNOSIS — N179 Acute kidney failure, unspecified: Secondary | ICD-10-CM | POA: Diagnosis not present

## 2017-04-12 DIAGNOSIS — I721 Aneurysm of artery of upper extremity: Secondary | ICD-10-CM | POA: Diagnosis not present

## 2017-04-12 DIAGNOSIS — D649 Anemia, unspecified: Secondary | ICD-10-CM | POA: Diagnosis not present

## 2017-04-12 DIAGNOSIS — I10 Essential (primary) hypertension: Secondary | ICD-10-CM | POA: Diagnosis not present

## 2017-04-12 DIAGNOSIS — Z794 Long term (current) use of insulin: Secondary | ICD-10-CM | POA: Diagnosis not present

## 2017-04-12 DIAGNOSIS — N186 End stage renal disease: Secondary | ICD-10-CM

## 2017-04-12 DIAGNOSIS — E1122 Type 2 diabetes mellitus with diabetic chronic kidney disease: Secondary | ICD-10-CM | POA: Insufficient documentation

## 2017-04-12 DIAGNOSIS — Z7902 Long term (current) use of antithrombotics/antiplatelets: Secondary | ICD-10-CM | POA: Diagnosis not present

## 2017-04-12 DIAGNOSIS — I9789 Other postprocedural complications and disorders of the circulatory system, not elsewhere classified: Secondary | ICD-10-CM | POA: Diagnosis not present

## 2017-04-12 DIAGNOSIS — Z992 Dependence on renal dialysis: Secondary | ICD-10-CM | POA: Diagnosis not present

## 2017-04-12 DIAGNOSIS — T81718A Complication of other artery following a procedure, not elsewhere classified, initial encounter: Secondary | ICD-10-CM

## 2017-04-12 DIAGNOSIS — I252 Old myocardial infarction: Secondary | ICD-10-CM | POA: Diagnosis not present

## 2017-04-12 DIAGNOSIS — D631 Anemia in chronic kidney disease: Secondary | ICD-10-CM | POA: Insufficient documentation

## 2017-04-12 DIAGNOSIS — Z85820 Personal history of malignant melanoma of skin: Secondary | ICD-10-CM | POA: Diagnosis not present

## 2017-04-12 DIAGNOSIS — K219 Gastro-esophageal reflux disease without esophagitis: Secondary | ICD-10-CM | POA: Insufficient documentation

## 2017-04-12 DIAGNOSIS — I429 Cardiomyopathy, unspecified: Secondary | ICD-10-CM | POA: Diagnosis not present

## 2017-04-12 DIAGNOSIS — Z87891 Personal history of nicotine dependence: Secondary | ICD-10-CM | POA: Insufficient documentation

## 2017-04-12 DIAGNOSIS — I729 Aneurysm of unspecified site: Secondary | ICD-10-CM

## 2017-04-12 MED ORDER — HYDRALAZINE HCL 25 MG PO TABS: 25.0000 mg | ORAL_TABLET | Freq: Three times a day (TID) | ORAL | 3 refills | Status: DC

## 2017-04-12 NOTE — Patient Instructions (Signed)
CHANGE Hydralazine to 25 mg, one tab three times per day  Labs today We will only contact you if something comes back abnormal or we need to make some changes. Otherwise no news is good news!  Your physician recommends that you schedule a follow-up appointment in: 6-8 weeks with Dr Aundra Dubin  Do the following things EVERYDAY: 1) Weigh yourself in the morning before breakfast. Write it down and keep it in a log. 2) Take your medicines as prescribed 3) Eat low salt foods-Limit salt (sodium) to 2000 mg per day.  4) Stay as active as you can everyday 5) Limit all fluids for the day to less than 2 liters

## 2017-04-12 NOTE — Progress Notes (Signed)
Advanced Heart Failure Clinic Note   Primary Care: [facility] [place], [place] Primary Cardiologist: Dr Otho Perl [facility])  HPI:  Shannon Lucas is a 83 y.o. female with PMH of Systolic CHF,  PAF on xarelto, CKD stage III, DMII, GERD, HLD, CVA, anemia, and HF.   Admitted 4/17 -> 6/59/93 with A/C systolic CHF. L/RHC as below with normal coronaries and elevated filling pressures. Diuresed 5 lbs. Hospital course complicated by hematoma at radial cath site that improved overnight. Medications adjusted as tolerated. Discharge weight 205 lbs.    Pt seen in ED 03/08/17 for worsening of hematoma as above.  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 Dr. Donnetta Hutching saw in consult and repaired under local anaesthesia and sedation.   She presents today for regular follow up. Wrist doing well.  Has followed up with vascular.  No orthopnea or bendopnea.  Breathing has been stable.  Gets mild SOB with showering. Mostly back and forth between two rooms in her house. OK on flat ground if she walks at a slow pace.  Taking lasix 40 mg daily, without needing extra.  Weight at home stable between 198-200.  BP dropping at times with occasional lightheadedness. SBP does not drop below 100, though is labile. Has been taking hydralazine 50 mg BID instead of 37.5 mg TID, as she can't cut the pill in half.   Labs 04/06/17 K 5.0, Creatinine 2.04, BUN 43  Review of systems complete and found to be negative unless listed in HPI.    PMH 1. Systolic CHF due to NICM - Echo 03/01/17 LVEF 25-30%, At least mild AS, Mild MR, Mod LAE, Mod RAE, Trivial PI, PA peak pressure 42 mm Hg. 2. PAF on Xarelto 3. CKD Stage III 4. DMII 5. GERD 6. HLD 7. Hx of CVA - ?2011-2012 with no lasting deficit 8. Chronic anemia   L/RHC 03/06/17 Dominance: Co-dominant  Left Main  Separate ostia LAD and LCx.  Left Anterior Descending  Luminal irregularities in the LAD. Small to moderate D1 with 70% ostial stenosis. Small to moderate D2 with 50% ostial stenosis.  Left  Circumflex  Large caliber vessel. Small to moderate OM1 with 40% ostial stenosis.  Right Coronary Artery  No significant disease. Relatively small vessel.   Right Heart Pressures RHC Procedural Findings: Hemodynamics (mmHg) RA mean 10 RV 74/14 PA 78/28, mean 44 PCWP mean 25 LV 165/25 AO 146/54 Oxygen saturations: PA 70% AO 99% Cardiac Output (Fick) 7.21  Cardiac Index (Fick) 3.59 PVR 2.65 WU      Past Medical History:  Diagnosis Date  . A-fib ([place])   . Arthritis    "a little bit qwhere" (02/28/2017)  . CHF (congestive heart failure) ([place])   . CKD (chronic kidney disease), stage III    [name] 02/28/2017  . Depression   . GERD (gastroesophageal reflux disease)   . High cholesterol   . Hypertension   . Melanoma of back ([place])   . Myocardial infarction [facility])    "one dr said I'd had 1" (02/28/2017)  . Stroke [facility]) ~ 2010; ~2012   "affected the right side but I fully recovered; just a light one" (02/28/2017)  . Type II diabetes mellitus ([place])     Current Outpatient Prescriptions  Medication Sig Dispense Refill  . amiodarone (PACERONE) 100 MG tablet Take 1 tablet (100 mg total) by mouth daily. 30 tablet 0  . ferrous sulfate 325 (65 FE) MG tablet Take 1 tablet (325 mg total) by mouth 3 (three) times daily with meals. [place]  tablet 0  . furosemide (LASIX) 40 MG tablet Take 1 tablet (40 mg total) by mouth daily. 30 tablet 6  . hydrALAZINE (APRESOLINE) 25 MG tablet Take 1.5 tablets (37.5 mg total) by mouth every 8 (eight) hours. 135 tablet 0  . isosorbide mononitrate (IMDUR) 30 MG 24 hr tablet Take 30 mg by mouth daily.  2  . LANTUS SOLOSTAR 100 UNIT/ML Solostar Pen Inject 50 Units into the skin at bedtime.   3  . losartan (COZAAR) 100 MG tablet Take 100 mg by mouth daily.  2  . pravastatin (PRAVACHOL) 40 MG tablet Take 40 mg by mouth daily.  2  . venlafaxine (EFFEXOR) 75 MG tablet Take 75 mg by mouth daily.     . VENTOLIN HFA 108 (90 Base) MCG/ACT inhaler Inhale 2 puffs into the  lungs every 6 (six) hours as needed for wheezing or shortness of breath.   0  . XARELTO 15 MG TABS tablet Take 1 tablet (15 mg total) by mouth daily. 42 tablet 4   No current facility-administered medications for this encounter.    No Known Allergies   Social History   Social History  . Marital status: Widowed    Spouse name: N/A  . Number of children: N/A  . Years of education: N/A   Occupational History  . Not on file.   Social History Main Topics  . Smoking status: Former Research scientist (life sciences)  . Smokeless tobacco: Never Used     Comment: 02/28/2017 "only smoked in the 1960s when we went out"  . Alcohol use No  . Drug use: No  . Sexual activity: No   Other Topics Concern  . Not on file   Social History Narrative  . No narrative on file   Family history No family history of premature CAD or CHF.   Vitals:   04/12/17 1347  BP: (!) 166/92  Pulse: 98  SpO2: 99%  Weight: 201 lb 9.6 oz (91.4 kg)   Wt Readings from Last 3 Encounters:  04/12/17 201 lb 9.6 oz (91.4 kg)  03/21/17 200 lb (90.7 kg)  03/15/17 202 lb 6.4 oz (91.8 kg)     PHYSICAL EXAM: General: Elderly but well appearing. No resp difficulty. HEENT: Normal Neck: supple. JVD 6-7 cm. Carotids 2+ bilat; no bruits. No thyromegaly or nodule noted. Cor: PMI nondisplaced. RRR, No M/G/R noted Lungs: CTAB, normal effort. Abdomen: soft, non-tender, distended, no HSM. No bruits or masses. +BS  Extremities: no cyanosis, clubbing, rash, R and LLE no edema. R radial area with small hematoma, slightly tender. 2+ pulses.  Neuro: alert & orientedx3, cranial nerves grossly intact. moves all 4 extremities w/o difficulty. Affect pleasant   ASSESSMENT & PLAN:  1. Chronic systolic HF.  - Echo 33-29% 02/2017. NICM by cath.  - NYHA III currently.  - Volume status stable on exam. Continue lasix 40 mg daily.  - Off [place] with AKI and Hyper K.  - Holding off on BB with bradycardia and labile BP.  - Change hydralazine 25 mg TID. Think  taking BID has worsened the lability of her BP.  - Continue imdur 30 mg daily - Reinforced fluid restriction to < 2 L daily, sodium restriction to less than 2000 mg daily, and the importance of daily weights.   2. CKD Stage III - Stable on recent check at Nephrologist. (5/24).  3. PAF - Continue amio 100 mg daily.   - Continue Xarelto.  4. DMII:  - Per PCP. 5. Anemia:  -  Started on [place] recent admission. Per PCP.   6. R radial pseudoaneurysm - Stable. Improved from previous exam. - Follow with Dr. Donnetta Hutching as needed.  Doing well overall. Will adjust hydralazine to attempt to improve the lability of her BP. No labs today (stable last week at nephrology). RTC 6-8 weeks. Sooner with any symptoms.   Shirley Friar, PA-C 04/12/17   Greater than 50% of the 25 minute visit was spent in counseling/coordination of care regarding disease state education, medication reconciliation, the importance of taking medications as prescribed, and fluid/salt restriction.

## 2017-04-20 ENCOUNTER — Other Ambulatory Visit (HOSPITAL_COMMUNITY): Payer: Self-pay | Admitting: Cardiology

## 2017-05-12 ENCOUNTER — Other Ambulatory Visit (HOSPITAL_COMMUNITY): Payer: Self-pay | Admitting: Cardiology

## 2017-06-07 ENCOUNTER — Encounter (HOSPITAL_COMMUNITY): Payer: Medicare Other | Admitting: Cardiology

## 2017-07-06 NOTE — Addendum Note (Signed)
Addendum  created 07/06/17 1334 by Roberts Gaudy, MD   Sign clinical note

## 2017-07-14 ENCOUNTER — Encounter (HOSPITAL_COMMUNITY): Payer: Self-pay | Admitting: Cardiology

## 2017-07-14 ENCOUNTER — Ambulatory Visit (HOSPITAL_COMMUNITY)
Admission: RE | Admit: 2017-07-14 | Discharge: 2017-07-14 | Disposition: A | Discharge status: Home / Self Care | Payer: Medicare Other | Source: Ambulatory Visit | Attending: Cardiology | Admitting: Cardiology

## 2017-07-14 VITALS — BP 149/78 | HR 77 | Wt 204.5 lb

## 2017-07-14 DIAGNOSIS — E785 Hyperlipidemia, unspecified: Secondary | ICD-10-CM | POA: Diagnosis not present

## 2017-07-14 DIAGNOSIS — I130 Hypertensive heart and chronic kidney disease with heart failure and stage 1 through stage 4 chronic kidney disease, or unspecified chronic kidney disease: Secondary | ICD-10-CM | POA: Diagnosis not present

## 2017-07-14 DIAGNOSIS — I480 Paroxysmal atrial fibrillation: Secondary | ICD-10-CM

## 2017-07-14 DIAGNOSIS — K219 Gastro-esophageal reflux disease without esophagitis: Secondary | ICD-10-CM | POA: Diagnosis not present

## 2017-07-14 DIAGNOSIS — I10 Essential (primary) hypertension: Secondary | ICD-10-CM | POA: Diagnosis not present

## 2017-07-14 DIAGNOSIS — I5022 Chronic systolic (congestive) heart failure: Secondary | ICD-10-CM

## 2017-07-14 DIAGNOSIS — Z794 Long term (current) use of insulin: Secondary | ICD-10-CM | POA: Insufficient documentation

## 2017-07-14 DIAGNOSIS — I5021 Acute systolic (congestive) heart failure: Secondary | ICD-10-CM | POA: Diagnosis not present

## 2017-07-14 DIAGNOSIS — Z8673 Personal history of transient ischemic attack (TIA), and cerebral infarction without residual deficits: Secondary | ICD-10-CM | POA: Insufficient documentation

## 2017-07-14 DIAGNOSIS — Z7902 Long term (current) use of antithrombotics/antiplatelets: Secondary | ICD-10-CM | POA: Diagnosis not present

## 2017-07-14 DIAGNOSIS — N183 Chronic kidney disease, stage 3 (moderate): Secondary | ICD-10-CM | POA: Insufficient documentation

## 2017-07-14 DIAGNOSIS — N179 Acute kidney failure, unspecified: Secondary | ICD-10-CM | POA: Diagnosis not present

## 2017-07-14 DIAGNOSIS — I2510 Atherosclerotic heart disease of native coronary artery without angina pectoris: Secondary | ICD-10-CM | POA: Insufficient documentation

## 2017-07-14 DIAGNOSIS — I429 Cardiomyopathy, unspecified: Secondary | ICD-10-CM | POA: Diagnosis not present

## 2017-07-14 DIAGNOSIS — Z87891 Personal history of nicotine dependence: Secondary | ICD-10-CM | POA: Diagnosis not present

## 2017-07-14 LAB — CBC
HEMATOCRIT: 35.7 % — AB (ref 36.0–46.0)
Hemoglobin: 11.1 g/dL — ABNORMAL LOW (ref 12.0–15.0)
MCH: 29.3 pg (ref 26.0–34.0)
MCHC: 31.1 g/dL (ref 30.0–36.0)
MCV: 94.2 fL (ref 78.0–100.0)
PLATELETS: 200 10*3/uL (ref 150–400)
RBC: 3.79 MIL/uL — AB (ref 3.87–5.11)
RDW: 15.5 % (ref 11.5–15.5)
WBC: 5.7 10*3/uL (ref 4.0–10.5)

## 2017-07-14 LAB — COMPREHENSIVE METABOLIC PANEL
ALBUMIN: 3.5 g/dL (ref 3.5–5.0)
ALT: 19 U/L (ref 14–54)
ANION GAP: 6 (ref 5–15)
AST: 22 U/L (ref 15–41)
Alkaline Phosphatase: 98 U/L (ref 38–126)
BUN: 28 mg/dL — ABNORMAL HIGH (ref 6–20)
CALCIUM: 9.2 mg/dL (ref 8.9–10.3)
CO2: 25 mmol/L (ref 22–32)
CREATININE: 1.48 mg/dL — AB (ref 0.44–1.00)
Chloride: 110 mmol/L (ref 101–111)
GFR calc Af Amer: 36 mL/min — ABNORMAL LOW (ref >60)
GFR calc non Af Amer: 31 mL/min — ABNORMAL LOW (ref >60)
GLUCOSE: 93 mg/dL (ref 65–99)
Potassium: 4.5 mmol/L (ref 3.5–5.1)
SODIUM: 141 mmol/L (ref 135–145)
Total Bilirubin: 0.7 mg/dL (ref 0.3–1.2)
Total Protein: 6 g/dL — ABNORMAL LOW (ref 6.5–8.1)

## 2017-07-14 LAB — LIPID PANEL
CHOL/HDL RATIO: 2.9 ratio
Cholesterol: 147 mg/dL (ref 0–200)
HDL: 51 mg/dL (ref >40)
LDL CALC: 83 mg/dL (ref 0–99)
Triglycerides: 66 mg/dL (ref <150)
VLDL: 13 mg/dL (ref 0–40)

## 2017-07-14 LAB — TSH: TSH: 2.847 u[IU]/mL (ref 0.350–4.500)

## 2017-07-14 MED ORDER — CARVEDILOL 6.25 MG PO TABS: 6.2500 mg | ORAL_TABLET | Freq: Two times a day (BID) | ORAL | 3 refills | Status: DC

## 2017-07-14 NOTE — Patient Instructions (Signed)
Start Carvedilol 6.25 mg (1 Tab)  2 times a day  Your physician recommends that you schedule a follow-up appointment in: 1 month with and ECHO  Your physician has requested that you have an echocardiogram. Echocardiography is a painless test that uses sound waves to create images of your heart. It provides your doctor with information about the size and shape of your heart and how well your heart's chambers and valves are working. This procedure takes approximately one hour. There are no restrictions for this procedure.

## 2017-07-15 NOTE — Progress Notes (Signed)
Advanced Heart Failure Clinic Note   Primary Care: [facility], [place] HF Cardiology: Dr. Aundra Dubin  HPI:  Shannon Lucas is a 83 y.o. female with PMH of Systolic CHF,  PAF on Xarelto, CKD stage III, DM II, GERD, HLD, CVA, anemia.   Admitted 4/17 -> 7/51/02 with A/C systolic CHF. L/RHC as below with normal coronaries and elevated filling pressures. Diuresed 5 lbs. Hospital course complicated by hematoma at radial cath site that improved overnight. Medications adjusted as tolerated. Discharge weight 205 lbs.    Pt seen in ED 03/08/17 for worsening of hematoma as above.  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 Dr. Donnetta Hutching saw in consult and repaired under local anaesthesia and sedation.   She presents today for regular followup.  Taking Lasix 40 mg every other day.  Generally doing ok. No dyspnea walking on flat ground, uses walker for balance.  She chronically sleeps on 3 pillows. No lightheadedness/syncope/falls. No BRBPR/melena.  Weight stable at home.  ECG suggestive of ectopic atrial rhythm today.   ECG (personally reviewed): suspect ectopic atrial rhythm   Labs (04/06/17): K 5.0, Creatinine 2.04, BUN 43, LFTs normal Labs (6/18): K 3.9, creatinine 1.68  Review of systems complete and found to be negative unless listed in HPI.    PMH 1. Chronic systolic CHF: Nonischemic cardiomyopathy.  - Echo 03/01/17 LVEF 25-30%, At least mild AS, Mild MR, Mod LAE, Mod RAE, Trivial PI, PA peak pressure 42 mm Hg. - LHC (4/18) with nonobstructive CAD.  2. Atrial fibrillation: Paroxysmal. On Xarelto and amiodarone.  3. CKD Stage III 4. DMII 5. GERD 6. HLD 7. Hx of CVA - ?2011-2012 with no lasting deficit 8. Chronic anemia 9. CAD: LHC (4/18) with 70% ostial D1, 50% ostial D2, 40% ostial OM1.    Current Outpatient Prescriptions  Medication Sig Dispense Refill  . amiodarone (PACERONE) 100 MG tablet Take 1 tablet (100 mg total) by mouth daily. 30 tablet 0  . ferrous sulfate 325 (65 FE) MG tablet Take 1 tablet  (325 mg total) by mouth 3 (three) times daily with meals. 90 tablet 0  . furosemide (LASIX) 40 MG tablet Take 1 tablet (40 mg total) by mouth daily. 30 tablet 6  . hydrALAZINE (APRESOLINE) 25 MG tablet Take 1 tablet (25 mg total) by mouth 3 (three) times daily. 90 tablet 3  . Insulin Pen Needle (NOVOFINE) 32G X 6 MM MISC by Does not apply route.    . isosorbide mononitrate (IMDUR) 30 MG 24 hr tablet Take 30 mg by mouth daily.  2  . LANTUS SOLOSTAR 100 UNIT/ML Solostar Pen Inject 50 Units into the skin at bedtime.   3  . losartan (COZAAR) 100 MG tablet Take 100 mg by mouth daily.  2  . pravastatin (PRAVACHOL) 40 MG tablet Take 40 mg by mouth daily.  2  . ranitidine (ZANTAC) 300 MG capsule Take 300 mg by mouth every evening.    . venlafaxine (EFFEXOR) 75 MG tablet Take 75 mg by mouth daily.     . VENTOLIN HFA 108 (90 Base) MCG/ACT inhaler Inhale 2 puffs into the lungs every 6 (six) hours as needed for wheezing or shortness of breath.   0  . XARELTO 15 MG TABS tablet Take 1 tablet (15 mg total) by mouth daily. 42 tablet 4  . carvedilol (COREG) 6.25 MG tablet Take 1 tablet (6.25 mg total) by mouth 2 (two) times daily. 60 tablet 3   No current facility-administered medications for this encounter.  No Known Allergies   Social History   Social History  . Marital status: Widowed    Spouse name: N/A  . Number of children: N/A  . Years of education: N/A   Occupational History  . Not on file.   Social History Main Topics  . Smoking status: Former Research scientist (life sciences)  . Smokeless tobacco: Never Used     Comment: 02/28/2017 "only smoked in the 1960s when we went out"  . Alcohol use No  . Drug use: No  . Sexual activity: No   Other Topics Concern  . Not on file   Social History Narrative  . No narrative on file   Family history No family history of premature CAD or CHF.   Vitals:   07/14/17 1020  BP: (!) 149/78  Pulse: 77  SpO2: 100%  Weight: 204 lb 8 oz (92.8 kg)   Wt Readings from Last 3  Encounters:  07/14/17 204 lb 8 oz (92.8 kg)  04/12/17 201 lb 9.6 oz (91.4 kg)  03/21/17 200 lb (90.7 kg)     PHYSICAL EXAM: General: NAD Neck: JVP 8 cm, no thyromegaly or thyroid nodule.  Lungs: Clear to auscultation bilaterally with normal respiratory effort. CV: Nondisplaced PMI.  Heart regular S1/S2, no S3/S4, 2/6 early SEM RUSB.  Trace edema bilaterally.  No carotid bruit.  Normal pedal pulses.  Abdomen: Soft, nontender, no hepatosplenomegaly, no distention.  Skin: Intact without lesions or rashes.  Neurologic: Alert and oriented x 3.  Psych: Normal affect. Extremities: No clubbing or cyanosis.  HEENT: Normal.   ASSESSMENT & PLAN:  1. Chronic systolic HF:  Echo 94-17% 02/2017. NICM by cath. NYHA II-III currently. Mild volume overload.  - Probably ok to continue Lasix 40 mg every other day.  Increase Lasix to daily if weight rising.  - Repeat echo in 10/18.  - Off spironolactone with AKI and Hyper K.  - Add Coreg 6.25 mg bid.   - Continue current hydralazine/Imdur.  - Reinforced fluid restriction to < 2 L daily, sodium restriction to less than 2000 mg daily, and the importance of daily weights.   2. CKD: Stage III. BMET today.  3. PAF: Ectopic atrial rhythm today.  - Continue amiodarone 100 mg daily.  LFTs/TSH today.  She will need regular eye exams.   - Continue Xarelto. CBC today.  4. CAD: Nonobstructive on 4/18 cath.   - Continue pravastatin, check lipids today.  - No ASA given stable CAD and Xarelto use.  5. HTN: Adding Coreg as above.   Followup in 1 month with echo.  Loralie Champagne, MD 07/15/17

## 2017-08-14 ENCOUNTER — Encounter (HOSPITAL_COMMUNITY): Payer: Self-pay | Admitting: Cardiology

## 2017-08-14 ENCOUNTER — Ambulatory Visit (HOSPITAL_COMMUNITY)
Admission: RE | Admit: 2017-08-14 | Discharge: 2017-08-14 | Disposition: A | Discharge status: Home / Self Care | Payer: Medicare Other | Source: Ambulatory Visit | Attending: Diagnostic Radiology | Admitting: Diagnostic Radiology

## 2017-08-14 ENCOUNTER — Ambulatory Visit (HOSPITAL_BASED_OUTPATIENT_CLINIC_OR_DEPARTMENT_OTHER)
Admission: RE | Admit: 2017-08-14 | Discharge: 2017-08-14 | Disposition: A | Discharge status: Home / Self Care | Payer: Medicare Other | Source: Ambulatory Visit | Attending: Cardiology | Admitting: Cardiology

## 2017-08-14 VITALS — BP 135/85 | HR 87 | Wt 206.0 lb

## 2017-08-14 DIAGNOSIS — R001 Bradycardia, unspecified: Secondary | ICD-10-CM | POA: Insufficient documentation

## 2017-08-14 DIAGNOSIS — E1122 Type 2 diabetes mellitus with diabetic chronic kidney disease: Secondary | ICD-10-CM | POA: Diagnosis not present

## 2017-08-14 DIAGNOSIS — I2510 Atherosclerotic heart disease of native coronary artery without angina pectoris: Secondary | ICD-10-CM | POA: Diagnosis not present

## 2017-08-14 DIAGNOSIS — E785 Hyperlipidemia, unspecified: Secondary | ICD-10-CM | POA: Insufficient documentation

## 2017-08-14 DIAGNOSIS — Z7901 Long term (current) use of anticoagulants: Secondary | ICD-10-CM | POA: Insufficient documentation

## 2017-08-14 DIAGNOSIS — I480 Paroxysmal atrial fibrillation: Secondary | ICD-10-CM | POA: Diagnosis not present

## 2017-08-14 DIAGNOSIS — Z79899 Other long term (current) drug therapy: Secondary | ICD-10-CM | POA: Diagnosis not present

## 2017-08-14 DIAGNOSIS — N183 Chronic kidney disease, stage 3 unspecified: Secondary | ICD-10-CM

## 2017-08-14 DIAGNOSIS — Z8673 Personal history of transient ischemic attack (TIA), and cerebral infarction without residual deficits: Secondary | ICD-10-CM | POA: Diagnosis not present

## 2017-08-14 DIAGNOSIS — I429 Cardiomyopathy, unspecified: Secondary | ICD-10-CM | POA: Diagnosis not present

## 2017-08-14 DIAGNOSIS — Z794 Long term (current) use of insulin: Secondary | ICD-10-CM | POA: Diagnosis not present

## 2017-08-14 DIAGNOSIS — I340 Nonrheumatic mitral (valve) insufficiency: Secondary | ICD-10-CM | POA: Insufficient documentation

## 2017-08-14 DIAGNOSIS — I5022 Chronic systolic (congestive) heart failure: Secondary | ICD-10-CM | POA: Insufficient documentation

## 2017-08-14 DIAGNOSIS — Z87891 Personal history of nicotine dependence: Secondary | ICD-10-CM | POA: Diagnosis not present

## 2017-08-14 DIAGNOSIS — D649 Anemia, unspecified: Secondary | ICD-10-CM | POA: Insufficient documentation

## 2017-08-14 DIAGNOSIS — I5021 Acute systolic (congestive) heart failure: Secondary | ICD-10-CM

## 2017-08-14 LAB — COMPREHENSIVE METABOLIC PANEL
ALT: 16 U/L (ref 14–54)
AST: 22 U/L (ref 15–41)
Albumin: 3.5 g/dL (ref 3.5–5.0)
Alkaline Phosphatase: 93 U/L (ref 38–126)
Anion gap: 6 (ref 5–15)
BUN: 30 mg/dL — AB (ref 6–20)
CHLORIDE: 110 mmol/L (ref 101–111)
CO2: 24 mmol/L (ref 22–32)
CREATININE: 1.61 mg/dL — AB (ref 0.44–1.00)
Calcium: 8.9 mg/dL (ref 8.9–10.3)
GFR calc Af Amer: 33 mL/min — ABNORMAL LOW (ref >60)
GFR calc non Af Amer: 28 mL/min — ABNORMAL LOW (ref >60)
Glucose, Bld: 69 mg/dL (ref 65–99)
Potassium: 4 mmol/L (ref 3.5–5.1)
SODIUM: 140 mmol/L (ref 135–145)
Total Bilirubin: 0.7 mg/dL (ref 0.3–1.2)
Total Protein: 6.4 g/dL — ABNORMAL LOW (ref 6.5–8.1)

## 2017-08-14 LAB — BRAIN NATRIURETIC PEPTIDE: B NATRIURETIC PEPTIDE 5: 620.4 pg/mL — AB (ref 0.0–100.0)

## 2017-08-14 MED ORDER — FUROSEMIDE 40 MG PO TABS: 40.0000 mg | ORAL_TABLET | Freq: Every day | ORAL | 6 refills | Status: DC

## 2017-08-14 MED ORDER — SACUBITRIL-VALSARTAN 24-26 MG PO TABS: 1.0000 | ORAL_TABLET | Freq: Two times a day (BID) | ORAL | 3 refills | Status: DC

## 2017-08-14 NOTE — Progress Notes (Signed)
  Echocardiogram 2D Echocardiogram has been performed.  Darlina Sicilian M 08/14/2017, 1:49 PM

## 2017-08-14 NOTE — Patient Instructions (Addendum)
Stop Coreg  Stop Losartan   Start Entresto 24/26 mg (1 tab), twice daily  Increase Furosemide 40 mg (1 tabs) in AM, then 20 mg (1/2 tabs) in PM for 3 DAYS  Furosemide 40 mg (1 tab) daily  Labs drawn today (if we do not call you, then your lab work was stable)   Your physician has recommended that you wear a holter monitor. Holter monitors are medical devices that record the heart's electrical activity. Doctors most often use these monitors to diagnose arrhythmias. Arrhythmias are problems with the speed or rhythm of the heartbeat. The monitor is a small, portable device. You can wear one while you do your normal daily activities. This is usually used to diagnose what is causing palpitations/syncope (passing out).  Your physician recommends that you return for lab work in: 10 days  Your physician recommends that you schedule a follow-up appointment in: 3 weeks with APP  Your physician recommends that you schedule a follow-up appointment in: 2 months with Shannon Dubin, MD

## 2017-08-15 NOTE — Progress Notes (Signed)
Advanced Heart Failure Clinic Note   Primary Care: [facility] Pr[place], [place] HF Cardiology: Dr. Aundra Dubin  HPI:  Shannon Lucas is a 83 y.o. female with PMH of Systolic CHF,  PAF on Xarelto, CKD stage III, DM II, GERD, HLD, CVA, anemia.   Admitted 4/17 -> 7/40/81 with A/C systolic CHF. L/RHC as below with normal coronaries and elevated filling pressures. Diuresed 5 lbs. Hospital course complicated by hematoma at radial cath site that improved overnight. Medications adjusted as tolerated. Discharge weight 205 lbs.    Pt seen in ED 03/08/17 for worsening of hematoma as above.  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 Dr. Donnetta Hutching saw in consult and repaired under local anaesthesia and sedation.   Echo today was reviewed, showing EF 25-30% with mildly decreased RV systolic function.    She presents today for followup of CHF.  HR dropped to as low as 37 bpm on Coreg 6.25 mg bid, so she is now taking Coreg 6.25 mg daily.  On echo today, she was noted to have frequent episodes of what appeared to be sinus pauses.  She denies lightheadedness or syncope.  She is using a walker.  No dyspnea walking in house.  Dyspnea after walking about 100 feet. Chronic 3 pillow orthopnea.  No chest pain.   Weight is up 2 lbs.  She is off spironolactone with elevated K.   ECG (personally reviewed): NSR, 1st degree AVB 316 msec, LVH with repolarization abnormality.   Labs (04/06/17): K 5.0, Creatinine 2.04, BUN 43, LFTs normal Labs (6/18): K 3.9, creatinine 1.68 Labs (8/18): LDL 83, HDL 51, hgb 11.1, K 4.5, creatinine 1.48, LFTs normal, TSH normal.   Review of systems complete and found to be negative unless listed in HPI.    PMH 1. Chronic systolic CHF: Nonischemic cardiomyopathy.  - Echo 03/01/17 LVEF 25-30%, At least mild AS, Mild MR, Mod LAE, Mod RAE, Trivial PI, PA peak pressure 42 mm Hg. - LHC (4/18) with nonobstructive CAD.  - Echo (10/18): EF 25-30%, mildly decreased RV systolic function, mild MR, mild AS.  2. Atrial  fibrillation: Paroxysmal. On Xarelto and amiodarone.  3. CKD Stage III 4. DMII 5. GERD 6. HLD 7. Hx of CVA - ?2011-2012 with no lasting deficit 8. Chronic anemia 9. CAD: LHC (4/18) with 70% ostial D1, 50% ostial D2, 40% ostial OM1.  10. Aortic stenosis: Mild on echo in 10/18.    Current Outpatient Prescriptions  Medication Sig Dispense Refill  . amiodarone (PACERONE) 100 MG tablet Take 1 tablet (100 mg total) by mouth daily. 30 tablet 0  . ferrous sulfate 325 (65 FE) MG tablet Take 1 tablet (325 mg total) by mouth 3 (three) times daily with meals. 90 tablet 0  . furosemide (LASIX) 40 MG tablet Take 1 tablet (40 mg total) by mouth daily. For 3 days take 40 mg in AM, then 20 mg in PM then take 40 mg every day 40 tablet 6  . hydrALAZINE (APRESOLINE) 25 MG tablet Take 1 tablet (25 mg total) by mouth 3 (three) times daily. 90 tablet 3  . Insulin Pen Needle (NOVOFINE) 32G X 6 MM MISC by Does not apply route.    . isosorbide mononitrate (IMDUR) 30 MG 24 hr tablet Take 30 mg by mouth daily.  2  . LANTUS SOLOSTAR 100 UNIT/ML Solostar Pen Inject 50 Units into the skin at bedtime.   3  . pravastatin (PRAVACHOL) 40 MG tablet Take 40 mg by mouth daily.  2  . ranitidine (ZANTAC)  300 MG capsule Take 300 mg by mouth every evening.    . venlafaxine (EFFEXOR) 75 MG tablet Take 75 mg by mouth daily.     . VENTOLIN HFA 108 (90 Base) MCG/ACT inhaler Inhale 2 puffs into the lungs every 6 (six) hours as needed for wheezing or shortness of breath.   0  . XARELTO 15 MG TABS tablet Take 1 tablet (15 mg total) by mouth daily. 42 tablet 4  . sacubitril-valsartan (ENTRESTO) 24-26 MG Take 1 tablet by mouth 2 (two) times daily. 60 tablet 3   No current facility-administered medications for this encounter.    No Known Allergies   Social History   Social History  . Marital status: Widowed    Spouse name: N/A  . Number of children: N/A  . Years of education: N/A   Occupational History  . Not on file.    Social History Main Topics  . Smoking status: Former Research scientist (life sciences)  . Smokeless tobacco: Never Used     Comment: 02/28/2017 "only smoked in the 1960s when we went out"  . Alcohol use No  . Drug use: No  . Sexual activity: No   Other Topics Concern  . Not on file   Social History Narrative  . No narrative on file   Family history No family history of premature CAD or CHF.   Vitals:   08/14/17 1339  BP: 135/85  Pulse: 87  SpO2: 99%  Weight: 206 lb (93.4 kg)   Wt Readings from Last 3 Encounters:  08/14/17 206 lb (93.4 kg)  07/14/17 204 lb 8 oz (92.8 kg)  04/12/17 201 lb 9.6 oz (91.4 kg)     PHYSICAL EXAM: General: NAD Neck: JVP 8-9 cm with HJR, no thyromegaly or thyroid nodule.  Lungs: Clear to auscultation bilaterally with normal respiratory effort. CV: Nondisplaced PMI.  Heart regular S1/S2, no S3/S4, 2/6 SEM RUSB with clear S2.  No peripheral edema.  No carotid bruit.  Normal pedal pulses.  Abdomen: Soft, nontender, no hepatosplenomegaly, no distention.  Skin: Intact without lesions or rashes.  Neurologic: Alert and oriented x 3.  Psych: Normal affect. Extremities: No clubbing or cyanosis.  HEENT: Normal.   ASSESSMENT & PLAN:  1. Chronic systolic HF:  Echo with EF 25-30% 02/2017, repeat echo today showed stable EF 25-30%. Nonischemic cardiomyopathy. NYHA III currently. She is mildly volume overloaded on exam.  - Increase Lasix to 40 qam, 20 qpm x 3 days then back to 40 mg daily.  - Stop losartan. Start Entresto 24/26 bid. BMET today and repeat in 10 days.  - Off spironolactone with AKI and hyperkalemia.  - Stop Coreg for now with bradycardia.    - Continue current hydralazine/Imdur, may be able to titrate off these as we increase Entresto.   - I will refer her to cardiac rehab at [facility].  2. CKD: Stage III. BMET today.  3. PAF: NSR today.   - Continue amiodarone 100 mg daily.  Recent LFTs/TSH were normal.  She will need regular eye exams.   - Continue  Xarelto.  4. CAD: Nonobstructive on 4/18 cath.   - Continue pravastatin, acceptable lipids in 8/18.   - No ASA given stable CAD and Xarelto use.  5. Bradycardia: Pauses noted on echo telemetry.  I am going to arrange for a 48 hour holter, and as above, she will be stopping Coreg.   Followup in 3 wks  Loralie Champagne, MD 08/15/17

## 2017-08-16 ENCOUNTER — Telehealth (HOSPITAL_COMMUNITY): Payer: Self-pay | Admitting: Pharmacist

## 2017-08-16 NOTE — Telephone Encounter (Signed)
Entresto 24-26 mg BID PA approved by [place] through 08/14/18.   Ruta Hinds. Velva Harman, PharmD, BCPS, CPP Clinical Pharmacist Pager: [phone] Phone: [phone] 08/16/2017 10:39 AM

## 2017-08-24 ENCOUNTER — Other Ambulatory Visit (HOSPITAL_COMMUNITY): Payer: Medicare Other

## 2017-08-25 ENCOUNTER — Other Ambulatory Visit (HOSPITAL_COMMUNITY): Payer: Medicare Other

## 2017-08-29 ENCOUNTER — Ambulatory Visit (HOSPITAL_COMMUNITY)
Admission: RE | Admit: 2017-08-29 | Discharge: 2017-08-29 | Disposition: A | Discharge status: Home / Self Care | Payer: Medicare Other | Source: Ambulatory Visit | Attending: Internal Medicine | Admitting: Internal Medicine

## 2017-08-29 ENCOUNTER — Ambulatory Visit (INDEPENDENT_AMBULATORY_CARE_PROVIDER_SITE_OTHER): Payer: Medicare Other

## 2017-08-29 DIAGNOSIS — R001 Bradycardia, unspecified: Secondary | ICD-10-CM

## 2017-08-29 DIAGNOSIS — I5022 Chronic systolic (congestive) heart failure: Secondary | ICD-10-CM | POA: Diagnosis not present

## 2017-08-29 LAB — BASIC METABOLIC PANEL
Anion gap: 8 (ref 5–15)
BUN: 26 mg/dL — ABNORMAL HIGH (ref 6–20)
CALCIUM: 8.7 mg/dL — AB (ref 8.9–10.3)
CO2: 24 mmol/L (ref 22–32)
Chloride: 104 mmol/L (ref 101–111)
Creatinine, Ser: 1.94 mg/dL — ABNORMAL HIGH (ref 0.44–1.00)
GFR calc Af Amer: 26 mL/min — ABNORMAL LOW (ref >60)
GFR, EST NON AFRICAN AMERICAN: 23 mL/min — AB (ref >60)
Glucose, Bld: 237 mg/dL — ABNORMAL HIGH (ref 65–99)
Potassium: 3.8 mmol/L (ref 3.5–5.1)
Sodium: 136 mmol/L (ref 135–145)

## 2017-08-30 ENCOUNTER — Other Ambulatory Visit (HOSPITAL_COMMUNITY): Payer: Medicare Other

## 2017-09-04 ENCOUNTER — Ambulatory Visit (HOSPITAL_COMMUNITY)
Admission: RE | Admit: 2017-09-04 | Discharge: 2017-09-04 | Disposition: A | Discharge status: Home / Self Care | Payer: Medicare Other | Source: Ambulatory Visit | Attending: Internal Medicine | Admitting: Internal Medicine

## 2017-09-04 ENCOUNTER — Encounter (HOSPITAL_COMMUNITY): Payer: Self-pay

## 2017-09-04 VITALS — BP 138/72 | HR 86 | Wt 206.8 lb

## 2017-09-04 DIAGNOSIS — I429 Cardiomyopathy, unspecified: Secondary | ICD-10-CM | POA: Insufficient documentation

## 2017-09-04 DIAGNOSIS — N183 Chronic kidney disease, stage 3 unspecified: Secondary | ICD-10-CM

## 2017-09-04 DIAGNOSIS — R001 Bradycardia, unspecified: Secondary | ICD-10-CM | POA: Diagnosis not present

## 2017-09-04 DIAGNOSIS — E119 Type 2 diabetes mellitus without complications: Secondary | ICD-10-CM | POA: Insufficient documentation

## 2017-09-04 DIAGNOSIS — Z87891 Personal history of nicotine dependence: Secondary | ICD-10-CM | POA: Insufficient documentation

## 2017-09-04 DIAGNOSIS — I480 Paroxysmal atrial fibrillation: Secondary | ICD-10-CM | POA: Diagnosis not present

## 2017-09-04 DIAGNOSIS — Z7901 Long term (current) use of anticoagulants: Secondary | ICD-10-CM | POA: Diagnosis not present

## 2017-09-04 DIAGNOSIS — I5022 Chronic systolic (congestive) heart failure: Secondary | ICD-10-CM

## 2017-09-04 DIAGNOSIS — Z8673 Personal history of transient ischemic attack (TIA), and cerebral infarction without residual deficits: Secondary | ICD-10-CM | POA: Insufficient documentation

## 2017-09-04 DIAGNOSIS — Z794 Long term (current) use of insulin: Secondary | ICD-10-CM | POA: Insufficient documentation

## 2017-09-04 DIAGNOSIS — E785 Hyperlipidemia, unspecified: Secondary | ICD-10-CM | POA: Diagnosis not present

## 2017-09-04 DIAGNOSIS — Z79899 Other long term (current) drug therapy: Secondary | ICD-10-CM | POA: Diagnosis not present

## 2017-09-04 DIAGNOSIS — K219 Gastro-esophageal reflux disease without esophagitis: Secondary | ICD-10-CM | POA: Diagnosis not present

## 2017-09-04 DIAGNOSIS — I10 Essential (primary) hypertension: Secondary | ICD-10-CM

## 2017-09-04 DIAGNOSIS — I350 Nonrheumatic aortic (valve) stenosis: Secondary | ICD-10-CM | POA: Diagnosis not present

## 2017-09-04 DIAGNOSIS — I2510 Atherosclerotic heart disease of native coronary artery without angina pectoris: Secondary | ICD-10-CM | POA: Insufficient documentation

## 2017-09-04 MED ORDER — HYDRALAZINE HCL 25 MG PO TABS
25.0000 mg | ORAL_TABLET | Freq: Three times a day (TID) | ORAL | 3 refills | Status: DC
Start: 2017-09-04 — End: 2018-01-10

## 2017-09-04 NOTE — Patient Instructions (Signed)
RESTART Hydralazine 25 mg tablet three times daily.  Follow up 2 months.  Take all medication as prescribed the day of your appointment. Bring all medications with you to your appointment.  Do the following things EVERYDAY: 1) Weigh yourself in the morning before breakfast. Write it down and keep it in a log. 2) Take your medicines as prescribed 3) Eat low salt foods-Limit salt (sodium) to 2000 mg per day.  4) Stay as active as you can everyday 5) Limit all fluids for the day to less than 2 liters

## 2017-09-04 NOTE — Progress Notes (Signed)
Advanced Heart Failure Clinic Note   Primary Care: [facility] Prude, [place] HF Cardiology: Dr. Aundra Dubin  HPI:  Shannon Lucas is a 83 y.o. female with PMH of Systolic CHF,  PAF on Xarelto, CKD stage III, DM II, GERD, HLD, CVA, anemia.   Admitted 4/17 -> 8/46/96 with A/C systolic CHF. L/RHC as below with normal coronaries and elevated filling pressures. Diuresed 5 lbs. Hospital course complicated by hematoma at radial cath site that improved overnight. Medications adjusted as tolerated. Discharge weight 205 lbs.    Pt seen in ED 03/08/17 for worsening of hematoma as above.  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 Dr. Donnetta Hutching saw in consult and repaired under local anaesthesia and sedation.   Echo today was reviewed, showing EF 25-30% with mildly decreased RV systolic function.    She presents today for HF follow up. She was seen by Dr. Aundra Dubin 3 weeks ago and noted to have sinus pauses. Her Coreg was stopped and a 48 hour Holter monitor was ordered. She was started on Entresto and referred to cardiac rehab. She is feeling well today. Weight is stable at 204 pounds. Last visit she was 205 pounds. Drinking more than 2L a day, eating some high salt foods. Denies SOB, walking around the house and into clinic without difficulty.    Labs (04/06/17): K 5.0, Creatinine 2.04, BUN 43, LFTs normal Labs (6/18): K 3.9, creatinine 1.68 Labs (8/18): LDL 83, HDL 51, hgb 11.1, K 4.5, creatinine 1.48, LFTs normal, TSH normal.   Review of systems complete and found to be negative unless listed in HPI.    PMH 1. Chronic systolic CHF: Nonischemic cardiomyopathy.  - Echo 03/01/17 LVEF 25-30%, At least mild AS, Mild MR, Mod LAE, Mod RAE, Trivial PI, PA peak pressure 42 mm Hg. - LHC (4/18) with nonobstructive CAD.  - Echo (10/18): EF 25-30%, mildly decreased RV systolic function, mild MR, mild AS.  2. Atrial fibrillation: Paroxysmal. On Xarelto and amiodarone.  3. CKD Stage III 4. DMII 5. GERD 6. HLD 7. Hx of CVA - ?2011-2012  with no lasting deficit 8. Chronic anemia 9. CAD: LHC (4/18) with 70% ostial D1, 50% ostial D2, 40% ostial OM1.  10. Aortic stenosis: Mild on echo in 10/18.    Current Outpatient Prescriptions  Medication Sig Dispense Refill  . amiodarone (PACERONE) 100 MG tablet Take 1 tablet (100 mg total) by mouth daily. 30 tablet 0  . ferrous sulfate 325 (65 FE) MG tablet Take 1 tablet (325 mg total) by mouth 3 (three) times daily with meals. 90 tablet 0  . hydrALAZINE (APRESOLINE) 25 MG tablet Take 1 tablet (25 mg total) by mouth 3 (three) times daily. 90 tablet 3  . Insulin Pen Needle (NOVOFINE) 32G X 6 MM MISC by Does not apply route.    . isosorbide mononitrate (IMDUR) 30 MG 24 hr tablet Take 30 mg by mouth daily.  2  . LANTUS SOLOSTAR 100 UNIT/ML Solostar Pen Inject 50 Units into the skin at bedtime.   3  . pravastatin (PRAVACHOL) 40 MG tablet Take 40 mg by mouth daily.  2  . ranitidine (ZANTAC) 300 MG capsule Take 300 mg by mouth every evening.    . sacubitril-valsartan (ENTRESTO) 24-26 MG Take 1 tablet by mouth 2 (two) times daily. 60 tablet 3  . venlafaxine (EFFEXOR) 75 MG tablet Take 75 mg by mouth daily.     . VENTOLIN HFA 108 (90 Base) MCG/ACT inhaler Inhale 2 puffs into the lungs every 6 (six) hours  as needed for wheezing or shortness of breath.   0  . XARELTO 15 MG TABS tablet Take 1 tablet (15 mg total) by mouth daily. 42 tablet 4   No current facility-administered medications for this encounter.    No Known Allergies   Social History   Social History  . Marital status: Widowed    Spouse name: N/A  . Number of children: N/A  . Years of education: N/A   Occupational History  . Not on file.   Social History Main Topics  . Smoking status: Former Research scientist (life sciences)  . Smokeless tobacco: Never Used     Comment: 02/28/2017 "only smoked in the 1960s when we went out"  . Alcohol use No  . Drug use: No  . Sexual activity: No   Other Topics Concern  . Not on file   Social History Narrative    . No narrative on file   Family history No family history of premature CAD or CHF.   Vitals:   09/04/17 1408  BP: 138/72  Pulse: 86  SpO2: 98%  Weight: 206 lb 12.8 oz (93.8 kg)   Wt Readings from Last 3 Encounters:  09/04/17 206 lb 12.8 oz (93.8 kg)  08/14/17 206 lb (93.4 kg)  07/14/17 204 lb 8 oz (92.8 kg)     PHYSICAL EXAM: General: Well appearing, elderly.  No resp difficulty. HEENT: Normal Neck: Supple. JVP 5-6. Carotids 2+ bilat; no bruits. No thyromegaly or nodule noted. Cor: PMI nondisplaced. RRR, 2/6 SEM at LUSB.  Lungs: CTAB, normal effort. Abdomen: Soft, non-tender, non-distended, no HSM. No bruits or masses. +BS  Extremities: No cyanosis, clubbing, or rash. R and LLE no edema.  Neuro: Alert & orientedx3, cranial nerves grossly intact. moves all 4 extremities w/o difficulty. Affect pleasant  ASSESSMENT & PLAN:  1. Chronic systolic HF:  Echo with EF 25-30% 02/2017, repeat echo in 08/2017 showed stable EF 25-30%. Nonischemic cardiomyopathy.  - NYHA III - She has not been taking hydralazine, will have her restart this.  - Volume appears stable on exam. Continue Lasix 40 mg daily.  - Not on Spiro with AKI and hyperkalemia in the past.  - Continue Entresto 24/26 mg BID.   2. CKD: Stage III.  - BMET today.   3. PAF: - Sinus brady today. Continue Amiodarone 100 mg daily.   4. CAD: Nonobstructive by cath in 02/2017 - Continue pravastatin.  - No ASA with Xarelto use.   5. Bradycardia:Results of Holter monitor pending.   Follow up in 2 months.   Arbutus Leas, NP 09/04/17

## 2017-09-06 ENCOUNTER — Telehealth (HOSPITAL_COMMUNITY): Payer: Self-pay

## 2017-09-06 DIAGNOSIS — I480 Paroxysmal atrial fibrillation: Secondary | ICD-10-CM

## 2017-09-06 MED ORDER — AMIODARONE HCL 100 MG PO TABS: 200.0000 mg | ORAL_TABLET | Freq: Two times a day (BID) | ORAL | 3 refills | Status: DC

## 2017-09-06 MED ORDER — IPRATROPIUM BROMIDE HFA 17 MCG/ACT IN AERS: 2.0000 | INHALATION_SPRAY | Freq: Four times a day (QID) | RESPIRATORY_TRACT | 12 refills | Status: DC | PRN

## 2017-09-06 NOTE — Telephone Encounter (Signed)
Notes recorded by Shirley Muscat, RN on 09/06/2017 at 1:22 PM EDT Pt aware of results and agreeable to med changes. Ep referral place as well med changes in [facility]  ------  Notes recorded by Larey Dresser, MD on 09/06/2017 at 12:11 PM EDT Atrial fibrillation noted as well as episodes concerning for VT. Increase amiodarone to 200 mg bid, and I would like her to see EP asap.

## 2017-09-15 ENCOUNTER — Encounter: Payer: Self-pay | Admitting: Cardiology

## 2017-09-22 ENCOUNTER — Telehealth (HOSPITAL_COMMUNITY): Payer: Self-pay | Admitting: *Deleted

## 2017-09-22 NOTE — Telephone Encounter (Signed)
Surgical clearance form faxed. Cataract surgery approved.

## 2017-09-29 ENCOUNTER — Encounter: Payer: Self-pay | Admitting: *Deleted

## 2017-09-29 ENCOUNTER — Other Ambulatory Visit: Payer: Self-pay | Admitting: Cardiology

## 2017-09-29 ENCOUNTER — Encounter: Payer: Self-pay | Admitting: Cardiology

## 2017-09-29 ENCOUNTER — Ambulatory Visit: Payer: Medicare Other | Admitting: Cardiology

## 2017-09-29 VITALS — BP 138/72 | HR 47 | Ht 65.0 in | Wt 209.0 lb

## 2017-09-29 DIAGNOSIS — I495 Sick sinus syndrome: Secondary | ICD-10-CM | POA: Diagnosis not present

## 2017-09-29 DIAGNOSIS — I480 Paroxysmal atrial fibrillation: Secondary | ICD-10-CM

## 2017-09-29 DIAGNOSIS — I428 Other cardiomyopathies: Secondary | ICD-10-CM

## 2017-09-29 NOTE — Patient Instructions (Addendum)
Medication Instructions:  Your physician recommends that you continue on your current medications as directed. Please refer to the Current Medication list given to you today.  * If you need a refill on your cardiac medications before your next appointment, please call your pharmacy. *  Labwork: None ordered  Testing/Procedures: Your physician has recommended that you have a pacemaker inserted. A pacemaker is a small device that is placed under the skin of your chest or abdomen to help control abnormal heart rhythms. This device uses electrical pulses to prompt the heart to beat at a normal rate. Pacemakers are used to treat heart rhythms that are too slow. Wire (leads) are attached to the pacemaker that goes into the chambers of you heart. This is done in the hospital and usually requires and overnight stay. Please see the instruction sheet given to you today for more information.  Follow-Up: Your physician recommends that you schedule a follow-up appointment in: 10 - 14 days, after your procedure on 10/17/17, with device clinic for a wound check.  Your physician recommends that you schedule a follow-up appointment in: 91 days, after your procedure on 10/17/17, with Dr. Curt Bears.  Thank you for choosing [facility]!!   Trinidad Curet, RN [phone]  Any Other Special Instructions Will Be Listed Below (If Applicable).   Pacemaker Implantation, Adult Pacemaker implantation is a procedure to place a pacemaker inside your chest. A pacemaker is a small computer that sends electrical signals to the heart and helps your heart beat normally. A pacemaker also stores information about your heart rhythms. You may need pacemaker implantation if you:  Have a slow heartbeat (bradycardia).  Faint (syncope).  Have shortness of breath (dyspnea) due to heart problems.  The pacemaker attaches to your heart through a wire, called a lead. Sometimes just one lead is needed. Other times, there will be  two leads. There are two types of pacemakers:  Transvenous pacemaker. This type is placed under the skin or muscle of your chest. The lead goes through a vein in the chest area to reach the inside of the heart.  Epicardial pacemaker. This type is placed under the skin or muscle of your chest or belly. The lead goes through your chest to the outside of the heart.  Tell a health care provider about:  Any allergies you have.  All medicines you are taking, including vitamins, herbs, eye drops, creams, and over-the-counter medicines.  Any problems you or family members have had with anesthetic medicines.  Any blood or bone disorders you have.  Any surgeries you have had.  Any medical conditions you have.  Whether you are pregnant or may be pregnant. What are the risks? Generally, this is a safe procedure. However, problems may occur, including:  Infection.  Bleeding.  Failure of the pacemaker or the lead.  Collapse of a lung or bleeding into a lung.  Blood clot inside a blood vessel with a lead.  Damage to the heart.  Infection inside the heart (endocarditis).  Allergic reactions to medicines.  What happens before the procedure? Staying hydrated Follow instructions from your health care provider about hydration, which may include:  Up to 2 hours before the procedure - you may continue to drink clear liquids, such as water, clear fruit juice, black coffee, and plain tea.  Eating and drinking restrictions Follow instructions from your health care provider about eating and drinking, which may include:  8 hours before the procedure - stop eating heavy meals or foods such  as meat, fried foods, or fatty foods.  6 hours before the procedure - stop eating light meals or foods, such as toast or cereal.  6 hours before the procedure - stop drinking milk or drinks that contain milk.  2 hours before the procedure - stop drinking clear liquids.  Medicines  Ask your health  care provider about: ? Changing or stopping your regular medicines. This is especially important if you are taking diabetes medicines or blood thinners. ? Taking medicines such as aspirin and ibuprofen. These medicines can thin your blood. Do not take these medicines before your procedure if your health care provider instructs you not to.  You may be given antibiotic medicine to help prevent infection. General instructions  You will have a heart evaluation. This may include an electrocardiogram (ECG), chest X-ray, and heart imaging (echocardiogram,  or echo) tests.  You will have blood tests.  Do not use any products that contain nicotine or tobacco, such as cigarettes and e-cigarettes. If you need help quitting, ask your health care provider.  Plan to have someone take you home from the hospital or clinic.  If you will be going home right after the procedure, plan to have someone with you for 24 hours.  Ask your health care provider how your surgical site will be marked or identified. What happens during the procedure?  To reduce your risk of infection: ? Your health care team will wash or sanitize their hands. ? Your skin will be washed with soap. ? Hair may be removed from the surgical area.  An IV tube will be inserted into one of your veins.  You will be given one or more of the following: ? A medicine to help you relax (sedative). ? A medicine to numb the area (local anesthetic). ? A medicine to make you fall asleep (general anesthetic).  If you are getting a transvenous pacemaker: ? An incision will be made in your upper chest. ? A pocket will be made for the pacemaker. It may be placed under the skin or between layers of muscle. ? The lead will be inserted into a blood vessel that returns to the heart. ? While X-rays are taken by an imaging machine (fluoroscopy), the lead will be advanced through the vein to the inside of your heart. ? The other end of the lead will be  tunneled under the skin and attached to the pacemaker.  If you are getting an epicardial pacemaker: ? An incision will be made near your ribs or breastbone (sternum) for the lead. ? The lead will be attached to the outside of your heart. ? Another incision will be made in your chest or upper belly to create a pocket for the pacemaker. ? The free end of the lead will be tunneled under the skin and attached to the pacemaker.  The transvenous or epicardial pacemaker will be tested. Imaging studies may be done to check the lead position.  The incisions will be closed with stitches (sutures), adhesive strips, or skin glue.  Bandages (dressing) will be placed over the incisions. The procedure may vary among health care providers and hospitals. What happens after the procedure?  Your blood pressure, heart rate, breathing rate, and blood oxygen level will be monitored until the medicines you were given have worn off.  You will be given antibiotics and pain medicine.  ECG and chest x-rays will be done.  You will wear a continuous type of ECG (Holter monitor) to check your heart  rhythm.  Your health care provider willprogram the pacemaker.  Do not drive for 24 hours if you received a sedative. This information is not intended to replace advice given to you by your health care provider. Make sure you discuss any questions you have with your health care provider. Document Released: 10/21/2002 Document Revised: 05/20/2016 Document Reviewed: 04/13/2016 Elsevier Interactive Patient Education  [employer].

## 2017-09-29 NOTE — Progress Notes (Signed)
Electrophysiology Office Note   Date:  09/29/2017   ID:  Shannon Lucas, DOB Sep 08, 1931, MRN [id number]  PCP:  Manfred Shirts, PA  Cardiologist:  Aundra Dubin Primary Electrophysiologist:  Constance Haw, MD    Chief Complaint  Patient presents with  . Advice Only    VT/PAF     History of Present Illness: Shannon Lucas is a 83 y.o. female who is being seen today for the evaluation of CHF at the request of [name], [name], [place]. Presenting today for electrophysiology evaluation.  She has a history of nonischemic cardiomyopathy, paroxysmal atrial fibrillation on Xarelto and amiodarone, stage III CKD, type 2 diabetes, hyperlipidemia, CVA, and coronary artery disease.  She is currently on Entresto.  She had a recent echocardiogram which showed an improvement of her ejection fraction to 35-40% from 25-30%.    Today, she denies symptoms of palpitations, chest pain, shortness of breath, orthopnea, PND, lower extremity edema, claudication, dizziness, presyncope, syncope, bleeding, or neurologic sequela. The patient is tolerating medications without difficulties.  Her main complaint today is of fatigue.  She says that her heart rate is generally in the 30s-40s.  She does have atrial fibrillation with rapid rates.  She sleeps a good amount of the day, which her daughter says is abnormal and has noticed that since she has been bradycardic.   Past Medical History:  Diagnosis Date  . A-fib ([place])   . Arthritis    "a little bit qwhere" (02/28/2017)  . CHF (congestive heart failure) ([place])   . CKD (chronic kidney disease), stage III ([place])    [name] 02/28/2017  . Depression   . GERD (gastroesophageal reflux disease)   . High cholesterol   . Hypertension   . Melanoma of back ([place])   . Myocardial infarction [facility])    "one dr said I'd had 1" (02/28/2017)  . Stroke [facility]) ~ 2010; ~2012   "affected the right side but I fully recovered; just a light one" (02/28/2017)  . Type II diabetes mellitus ([place])    Past  Surgical History:  Procedure Laterality Date  . APPENDECTOMY    . KNEE SURGERY Left 1970s   "I have 1/3 of my knee left in there"  . LAPAROSCOPIC CHOLECYSTECTOMY    . MELANOMA EXCISION     "back"  . REPAIR RIGHT RADIAL FALSE ANEURYSM Right 03/08/2017   Performed by Rosetta Posner, MD at [facility] OR  . Right/Left Heart Cath and Coronary Angiography N/A 03/06/2017   Performed by Larey Dresser, MD at [place] [facility]  . TUBAL LIGATION       Current Outpatient Medications  Medication Sig Dispense Refill  . amiodarone (PACERONE) 100 MG tablet Take 2 tablets (200 mg total) by mouth 2 (two) times daily. 60 tablet 3  . ferrous sulfate 325 (65 FE) MG tablet Take 1 tablet (325 mg total) by mouth 3 (three) times daily with meals. 90 tablet 0  . furosemide (LASIX) 40 MG tablet Take 40 mg by mouth. 40 mg in the AM and 20 mg in the PM    . hydrALAZINE (APRESOLINE) 25 MG tablet Take 1 tablet (25 mg total) by mouth 3 (three) times daily. 90 tablet 3  . Insulin Pen Needle (NOVOFINE) 32G X 6 MM MISC by Does not apply route.    Marland Kitchen ipratropium (ATROVENT HFA) 17 MCG/ACT inhaler Inhale 2 puffs into the lungs every 6 (six) hours as needed for wheezing. 1 Inhaler 12  . isosorbide mononitrate (IMDUR) 30  MG 24 hr tablet Take 30 mg by mouth daily.  2  . LANTUS SOLOSTAR 100 UNIT/ML Solostar Pen Inject 50 Units into the skin at bedtime.   3  . pravastatin (PRAVACHOL) 40 MG tablet Take 40 mg by mouth daily.  2  . ranitidine (ZANTAC) 300 MG capsule Take 300 mg by mouth every evening.    . sacubitril-valsartan (ENTRESTO) 24-26 MG Take 1 tablet by mouth 2 (two) times daily. 60 tablet 3  . venlafaxine (EFFEXOR) 75 MG tablet Take 75 mg by mouth daily.     [name] 15 MG TABS tablet Take 1 tablet (15 mg total) by mouth daily. 42 tablet 4   No current facility-administered medications for this visit.     Allergies:   Patient has no known allergies.   Social History:  The patient  reports that she has quit smoking. she  has never used smokeless tobacco. She reports that she does not drink alcohol or use drugs.   Family History:  The patient's family history includes Diabetes in her mother; Parkinson's disease in her brother; Stroke in her father.    ROS:  Please see the history of present illness.   Otherwise, review of systems is positive for none.   All other systems are reviewed and negative.    PHYSICAL EXAM: VS:  BP 138/72   Pulse (!) 47   Ht 5\' 5"  (1.651 m)   Wt 209 lb (94.8 kg)   BMI 34.78 kg/m  , BMI Body mass index is 34.78 kg/m. GEN: Well nourished, well developed, in no acute distress  HEENT: normal  Neck: no JVD, carotid bruits, or masses Cardiac: RRR; no murmurs, rubs, or gallops,no edema  Respiratory:  clear to auscultation bilaterally, normal work of breathing GI: soft, nontender, nondistended, + BS MS: no deformity or atrophy  Skin: warm and dry Neuro:  Strength and sensation are intact Psych: euthymic mood, full affect  EKG:  EKG is ordered today. Personal review of the ekg ordered shows sinus rhythm, rate 47, nonspecific T wave changes  Recent Labs: 07/14/2017: Hemoglobin 11.1; Platelets 200; TSH 2.847 08/14/2017: ALT 16; B Natriuretic Peptide 620.4 08/29/2017: BUN 26; Creatinine, Ser 1.94; Potassium 3.8; Sodium 136    Lipid Panel     Component Value Date/Time   CHOL 147 07/14/2017 1109   TRIG 66 07/14/2017 1109   HDL 51 07/14/2017 1109   CHOLHDL 2.9 07/14/2017 1109   VLDL 13 07/14/2017 1109   LDLCALC 83 07/14/2017 1109     Wt Readings from Last 3 Encounters:  09/29/17 209 lb (94.8 kg)  09/04/17 206 lb 12.8 oz (93.8 kg)  08/14/17 206 lb (93.4 kg)      Other studies Reviewed: Additional studies/ records that were reviewed today include: TTE 08/14/17  Review of the above records today demonstrates:  - Left ventricle: LVEF is depressed at approximately 35 to 40% with   diffuse hypokinesis. The cavity size was mildly dilated. Wall   thickness was increased in a  pattern of mild LVH. The study is   not technically sufficient to allow evaluation of LV diastolic   function. - Aortic valve: AV is thickened, calcified with very mildly   restricted motion. - Mitral valve: Calcified annulus. Mildly thickened leaflets .   There was mild regurgitation. - Left atrium: The atrium was mildly dilated. - Right ventricle: Systolic function was mildly reduced. - Right atrium: The atrium was mildly dilated. - Pulmonary arteries: PA peak pressure: 50 mm Hg (S). -  Impressions: Frequent pauses during study.  Holter 09/06/17 - personally reviewed 1. Abnormal holter.  2. Atrial fibrillation at times with RVR noted.  3. I am concerned that there are runs of VT (not just afib with aberrancy).  4. Up to 2 second pauses.    ASSESSMENT AND PLAN:  1.  Chronic systolic heart failure due to nonischemic cardiomyopathy: Was put on Entresto with improvement in her LV ejection fraction.  Would continue her optimal medical therapy.  2.  Paroxysmal atrial fibrillation: Currently on amiodarone 200 mg daily anticoagulated with Xarelto.  In review of her Holter monitor, it is not clear to me that this wide-complex tachycardia is VT, it appears to be most consistent with atrial fibrillation with aberrancy.  She does have a narrow beats during her arrhythmia with a short long interval prior to followed by a short interval.  This sets up the possibility for return of normal conduction after the long interval with aberrant conduction after the short interval.  Due to that, we Nyjah Denio continue her on 200 mg of amiodarone daily.  This patients CHA2DS2-VASc Score and unadjusted Ischemic Stroke Rate (% per year) is equal to 7.2 % stroke rate/year from a score of 5  Above score calculated as 1 point each if present [CHF, HTN, DM, Vascular=MI/PAD/Aortic Plaque, Age if 65-74, or Female] Above score calculated as 2 points each if present [Age > 75, or Stroke/TIA/TE]  3.  Tachybradycardia  syndrome: Heart rates have been slow in the 30s-40s with tachycardia due to atrial fibrillation as well.  She is weak and fatigued with bradycardia.  She thus would benefit from pacemaker implant.  She has an ejection fraction of 35-40%, and thus Jissel Slavens work to implant a his lead.  If it is a pacemaker implant were discussed.  Risks include bleeding, tamponade, infection, and pneumothorax.  She understands these risks and is agreed to the procedure.  Current medicines are reviewed at length with the patient today.   The patient does not have concerns regarding her medicines.  The following changes were made today:  none  Labs/ tests ordered today include:  Orders Placed This Encounter  Procedures  . EKG 12-Lead     Disposition:   FU with Oluwadarasimi Favor 3 months  Signed, Chelsey Redondo Meredith Leeds, MD  09/29/2017 2:41 PM     [place] [address] [place] [address] [phone] (office) [phone] (fax)

## 2017-10-04 ENCOUNTER — Telehealth: Payer: Self-pay | Admitting: *Deleted

## 2017-10-04 NOTE — Telephone Encounter (Signed)
Updated pt that her PPM implant time on 12/4 has been moved back.   Advised to arrive to hospital that morning at 8:30 am instead of original time of 7:30. Pt is agreeable and likes the new arrival time better.

## 2017-10-14 DIAGNOSIS — E559 Vitamin D deficiency, unspecified: Secondary | ICD-10-CM | POA: Insufficient documentation

## 2017-10-16 ENCOUNTER — Encounter (HOSPITAL_COMMUNITY): Payer: Medicare Other | Admitting: Cardiology

## 2017-10-17 ENCOUNTER — Ambulatory Visit (HOSPITAL_COMMUNITY): Admission: RE | Admit: 2017-10-17 | Payer: Medicare Other | Source: Ambulatory Visit | Admitting: Cardiology

## 2017-10-17 ENCOUNTER — Encounter (HOSPITAL_COMMUNITY): Admission: RE | Payer: Self-pay | Source: Ambulatory Visit

## 2017-10-17 SURGERY — PACEMAKER IMPLANT

## 2017-10-27 ENCOUNTER — Telehealth: Payer: Self-pay | Admitting: Cardiology

## 2017-10-27 NOTE — Telephone Encounter (Signed)
New Message   Was calling pt to confirm of appt on 10/30/2017. Patient states that she had surgery scheduled on Dec.4 that was cancelled due to her havingf the flu. Therefore no surgery. I did reschedule the 17th appt for 12/07/2016. But she wants to see about rescheduling the actually surgery. Please call patient.

## 2017-10-27 NOTE — Telephone Encounter (Signed)
Informed patient I would call her next week to reschedule.  She is agreeable

## 2017-10-30 ENCOUNTER — Ambulatory Visit: Payer: Medicare Other

## 2017-11-03 ENCOUNTER — Encounter: Payer: Self-pay | Admitting: *Deleted

## 2017-11-03 NOTE — Telephone Encounter (Signed)
Rescheduled PPM implant for 11/20/17. Instructions reviewed w/ pt and dtr and mailed to home address. Advised that we will do pre procedure lab work in the hospital the morning of her procedure.  Explained that I would call if Dr Curt Bears would like drawn prior to day of procedure. Post implant appts scheduled. Patient & dtr verbalized understanding and agreeable to plan.

## 2017-11-16 ENCOUNTER — Telehealth: Payer: Self-pay | Admitting: Cardiology

## 2017-11-16 ENCOUNTER — Other Ambulatory Visit: Payer: Medicare Other | Admitting: *Deleted

## 2017-11-16 DIAGNOSIS — I495 Sick sinus syndrome: Secondary | ICD-10-CM

## 2017-11-16 DIAGNOSIS — Z01812 Encounter for preprocedural laboratory examination: Secondary | ICD-10-CM

## 2017-11-16 NOTE — Telephone Encounter (Signed)
Informed dtr that Dr. Curt Bears would like pt to have pre procedure labs today/tomorrow prior to [facility] implant on Monday d/t abnormal blood test results last October. Dtr is agreeable and will bring mom by the office today, before 5pm, for labs.

## 2017-11-17 LAB — CBC WITH DIFFERENTIAL/PLATELET
Basophils Absolute: 0 10*3/uL (ref 0.0–0.2)
Basos: 0 %
EOS (ABSOLUTE): 0.1 10*3/uL (ref 0.0–0.4)
EOS: 1 %
HEMATOCRIT: 37.2 % (ref 34.0–46.6)
HEMOGLOBIN: 12.2 g/dL (ref 11.1–15.9)
IMMATURE GRANS (ABS): 0 10*3/uL (ref 0.0–0.1)
Immature Granulocytes: 0 %
LYMPHS ABS: 1.9 10*3/uL (ref 0.7–3.1)
LYMPHS: 26 %
MCH: 30.8 pg (ref 26.6–33.0)
MCHC: 32.8 g/dL (ref 31.5–35.7)
MCV: 94 fL (ref 79–97)
MONOCYTES: 10 %
Monocytes Absolute: 0.7 10*3/uL (ref 0.1–0.9)
NEUTROS ABS: 4.5 10*3/uL (ref 1.4–7.0)
Neutrophils: 63 %
Platelets: 226 10*3/uL (ref 150–379)
RBC: 3.96 x10E6/uL (ref 3.77–5.28)
RDW: 15 % (ref 12.3–15.4)
WBC: 7.2 10*3/uL (ref 3.4–10.8)

## 2017-11-17 LAB — BASIC METABOLIC PANEL
BUN / CREAT RATIO: 15 (ref 12–28)
BUN: 23 mg/dL (ref 8–27)
CO2: 22 mmol/L (ref 20–29)
CREATININE: 1.52 mg/dL — AB (ref 0.57–1.00)
Calcium: 8.9 mg/dL (ref 8.7–10.3)
Chloride: 106 mmol/L (ref 96–106)
GFR calc non Af Amer: 31 mL/min/{1.73_m2} — ABNORMAL LOW (ref >59)
GFR, EST AFRICAN AMERICAN: 36 mL/min/{1.73_m2} — AB (ref >59)
Glucose: 71 mg/dL (ref 65–99)
Potassium: 4 mmol/L (ref 3.5–5.2)
Sodium: 143 mmol/L (ref 134–144)

## 2017-11-20 ENCOUNTER — Ambulatory Visit (HOSPITAL_COMMUNITY)
Admission: RE | Disposition: A | Discharge status: Home / Self Care | Payer: Self-pay | Source: Ambulatory Visit | Attending: Cardiology

## 2017-11-20 ENCOUNTER — Ambulatory Visit (HOSPITAL_COMMUNITY): Payer: Medicare Other

## 2017-11-20 ENCOUNTER — Ambulatory Visit (HOSPITAL_COMMUNITY)
Admission: RE | Admit: 2017-11-20 | Discharge: 2017-11-21 | Disposition: A | Discharge status: Home / Self Care | Payer: Medicare Other | Source: Ambulatory Visit | Attending: Cardiology | Admitting: Cardiology

## 2017-11-20 DIAGNOSIS — Z823 Family history of stroke: Secondary | ICD-10-CM | POA: Insufficient documentation

## 2017-11-20 DIAGNOSIS — I495 Sick sinus syndrome: Secondary | ICD-10-CM | POA: Diagnosis not present

## 2017-11-20 DIAGNOSIS — I2510 Atherosclerotic heart disease of native coronary artery without angina pectoris: Secondary | ICD-10-CM | POA: Diagnosis not present

## 2017-11-20 DIAGNOSIS — I428 Other cardiomyopathies: Secondary | ICD-10-CM | POA: Insufficient documentation

## 2017-11-20 DIAGNOSIS — I252 Old myocardial infarction: Secondary | ICD-10-CM | POA: Diagnosis not present

## 2017-11-20 DIAGNOSIS — Z87891 Personal history of nicotine dependence: Secondary | ICD-10-CM | POA: Insufficient documentation

## 2017-11-20 DIAGNOSIS — I480 Paroxysmal atrial fibrillation: Secondary | ICD-10-CM | POA: Insufficient documentation

## 2017-11-20 DIAGNOSIS — I5022 Chronic systolic (congestive) heart failure: Secondary | ICD-10-CM | POA: Insufficient documentation

## 2017-11-20 DIAGNOSIS — I130 Hypertensive heart and chronic kidney disease with heart failure and stage 1 through stage 4 chronic kidney disease, or unspecified chronic kidney disease: Secondary | ICD-10-CM | POA: Insufficient documentation

## 2017-11-20 DIAGNOSIS — Z8673 Personal history of transient ischemic attack (TIA), and cerebral infarction without residual deficits: Secondary | ICD-10-CM | POA: Insufficient documentation

## 2017-11-20 DIAGNOSIS — Z950 Presence of cardiac pacemaker: Secondary | ICD-10-CM

## 2017-11-20 DIAGNOSIS — Z95818 Presence of other cardiac implants and grafts: Secondary | ICD-10-CM

## 2017-11-20 DIAGNOSIS — Z833 Family history of diabetes mellitus: Secondary | ICD-10-CM | POA: Insufficient documentation

## 2017-11-20 DIAGNOSIS — Z85820 Personal history of malignant melanoma of skin: Secondary | ICD-10-CM | POA: Insufficient documentation

## 2017-11-20 DIAGNOSIS — E1122 Type 2 diabetes mellitus with diabetic chronic kidney disease: Secondary | ICD-10-CM | POA: Insufficient documentation

## 2017-11-20 DIAGNOSIS — Z7901 Long term (current) use of anticoagulants: Secondary | ICD-10-CM | POA: Insufficient documentation

## 2017-11-20 DIAGNOSIS — N183 Chronic kidney disease, stage 3 (moderate): Secondary | ICD-10-CM | POA: Diagnosis not present

## 2017-11-20 HISTORY — PX: PACEMAKER IMPLANT: EP1218

## 2017-11-20 LAB — SURGICAL PCR SCREEN
MRSA, PCR: NEGATIVE
Staphylococcus aureus: NEGATIVE

## 2017-11-20 LAB — GLUCOSE, CAPILLARY
GLUCOSE-CAPILLARY: 57 mg/dL — AB (ref 65–99)
GLUCOSE-CAPILLARY: 87 mg/dL (ref 65–99)
GLUCOSE-CAPILLARY: 174 mg/dL — AB (ref 65–99)
GLUCOSE-CAPILLARY: 190 mg/dL — AB (ref 65–99)
Glucose-Capillary: 70 mg/dL (ref 65–99)

## 2017-11-20 SURGERY — PACEMAKER IMPLANT

## 2017-11-20 MED ORDER — AMIODARONE HCL 200 MG PO TABS
200.0000 mg | ORAL_TABLET | Freq: Every day | ORAL | Status: DC
  Administered 2017-11-20: 200 mg via ORAL
  Filled 2017-11-20: qty 1

## 2017-11-20 MED ORDER — VENLAFAXINE HCL 75 MG PO TABS
75.0000 mg | ORAL_TABLET | Freq: Every day | ORAL | Status: DC
  Administered 2017-11-20: 21:00:00 75 mg via ORAL
  Filled 2017-11-20: qty 1

## 2017-11-20 MED ORDER — FENTANYL CITRATE (PF) 100 MCG/2ML IJ SOLN
INTRAMUSCULAR | Status: AC
  Filled 2017-11-20: qty 2

## 2017-11-20 MED ORDER — HEPARIN (PORCINE) IN NACL 2-0.9 UNIT/ML-% IJ SOLN
INTRAMUSCULAR | Status: AC
  Filled 2017-11-20: qty 500

## 2017-11-20 MED ORDER — ACETAMINOPHEN 325 MG PO TABS: 325.0000 mg | ORAL_TABLET | ORAL | Status: DC | PRN

## 2017-11-20 MED ORDER — MIDAZOLAM HCL 5 MG/5ML IJ SOLN
INTRAMUSCULAR | Status: AC
  Filled 2017-11-20: qty 5

## 2017-11-20 MED ORDER — VITAMIN D (ERGOCALCIFEROL) 1.25 MG (50000 UNIT) PO CAPS: 50000.0000 [IU] | ORAL_CAPSULE | ORAL | Status: DC

## 2017-11-20 MED ORDER — FENTANYL CITRATE (PF) 100 MCG/2ML IJ SOLN
INTRAMUSCULAR | Status: DC | PRN
  Administered 2017-11-20 (×2): 25 ug via INTRAVENOUS

## 2017-11-20 MED ORDER — BENZONATATE 100 MG PO CAPS
100.0000 mg | ORAL_CAPSULE | Freq: Two times a day (BID) | ORAL | Status: DC
  Administered 2017-11-20: 21:00:00 100 mg via ORAL
  Filled 2017-11-20 (×2): qty 1

## 2017-11-20 MED ORDER — IPRATROPIUM BROMIDE 0.02 % IN SOLN: 0.5000 mL | Freq: Four times a day (QID) | RESPIRATORY_TRACT | Status: DC | PRN

## 2017-11-20 MED ORDER — ACETAMINOPHEN 500 MG PO TABS
1000.0000 mg | ORAL_TABLET | Freq: Four times a day (QID) | ORAL | Status: DC | PRN
  Administered 2017-11-21: 1000 mg via ORAL
  Filled 2017-11-20: qty 2

## 2017-11-20 MED ORDER — FUROSEMIDE 40 MG PO TABS
40.0000 mg | ORAL_TABLET | Freq: Every day | ORAL | Status: DC
  Filled 2017-11-20: qty 1

## 2017-11-20 MED ORDER — SODIUM CHLORIDE 0.9 % IR SOLN
Status: AC
  Filled 2017-11-20: qty 2

## 2017-11-20 MED ORDER — CEFAZOLIN SODIUM-DEXTROSE 1-4 GM/50ML-% IV SOLN
1.0000 g | Freq: Four times a day (QID) | INTRAVENOUS | Status: AC
  Administered 2017-11-20 – 2017-11-21 (×3): 1 g via INTRAVENOUS
  Filled 2017-11-20 (×3): qty 50

## 2017-11-20 MED ORDER — PRAVASTATIN SODIUM 40 MG PO TABS
40.0000 mg | ORAL_TABLET | Freq: Every day | ORAL | Status: DC
  Administered 2017-11-20: 21:00:00 40 mg via ORAL
  Filled 2017-11-20: qty 1

## 2017-11-20 MED ORDER — SODIUM CHLORIDE 0.9 % IV SOLN
INTRAVENOUS | Status: DC
  Administered 2017-11-20: 11:00:00 via INTRAVENOUS

## 2017-11-20 MED ORDER — ONDANSETRON HCL 4 MG/2ML IJ SOLN
4.0000 mg | Freq: Four times a day (QID) | INTRAMUSCULAR | Status: DC | PRN
  Administered 2017-11-21: 4 mg via INTRAVENOUS
  Filled 2017-11-20: qty 2

## 2017-11-20 MED ORDER — CEFAZOLIN SODIUM-DEXTROSE 2-4 GM/100ML-% IV SOLN
2.0000 g | INTRAVENOUS | Status: AC
  Administered 2017-11-20: 2 g via INTRAVENOUS

## 2017-11-20 MED ORDER — HEPARIN (PORCINE) IN NACL 2-0.9 UNIT/ML-% IJ SOLN
INTRAMUSCULAR | Status: AC | PRN
  Administered 2017-11-20: 500 mL

## 2017-11-20 MED ORDER — LIDOCAINE HCL (PF) 1 % IJ SOLN
INTRAMUSCULAR | Status: DC | PRN
  Administered 2017-11-20: 60 mL

## 2017-11-20 MED ORDER — SACUBITRIL-VALSARTAN 24-26 MG PO TABS
1.0000 | ORAL_TABLET | Freq: Two times a day (BID) | ORAL | Status: DC
  Administered 2017-11-20: 1 via ORAL
  Filled 2017-11-20 (×2): qty 1

## 2017-11-20 MED ORDER — INSULIN GLARGINE 100 UNIT/ML ~~LOC~~ SOLN
50.0000 [IU] | Freq: Every day | SUBCUTANEOUS | Status: DC
  Filled 2017-11-20 (×2): qty 0.5

## 2017-11-20 MED ORDER — LIDOCAINE HCL (PF) 1 % IJ SOLN
INTRAMUSCULAR | Status: AC
  Filled 2017-11-20: qty 60

## 2017-11-20 MED ORDER — CEFAZOLIN SODIUM-DEXTROSE 2-4 GM/100ML-% IV SOLN
INTRAVENOUS | Status: AC
  Filled 2017-11-20: qty 100

## 2017-11-20 MED ORDER — MUPIROCIN 2 % EX OINT
TOPICAL_OINTMENT | CUTANEOUS | Status: AC
  Administered 2017-11-20: 1 via TOPICAL
  Filled 2017-11-20: qty 22

## 2017-11-20 MED ORDER — ISOSORBIDE MONONITRATE ER 30 MG PO TB24
30.0000 mg | ORAL_TABLET | Freq: Every day | ORAL | Status: DC
  Administered 2017-11-20: 21:00:00 30 mg via ORAL
  Filled 2017-11-20: qty 1

## 2017-11-20 MED ORDER — MIDAZOLAM HCL 5 MG/5ML IJ SOLN
INTRAMUSCULAR | Status: DC | PRN
  Administered 2017-11-20: 1 mg via INTRAVENOUS

## 2017-11-20 MED ORDER — SODIUM CHLORIDE 0.9 % IR SOLN
80.0000 mg | Status: AC
  Administered 2017-11-20: 80 mg

## 2017-11-20 MED ORDER — MUPIROCIN 2 % EX OINT
1.0000 "application " | TOPICAL_OINTMENT | Freq: Once | CUTANEOUS | Status: AC
  Administered 2017-11-20: 1 via TOPICAL

## 2017-11-20 MED ORDER — RIVAROXABAN 15 MG PO TABS
15.0000 mg | ORAL_TABLET | Freq: Every day | ORAL | Status: DC
  Administered 2017-11-20: 15 mg via ORAL
  Filled 2017-11-20: qty 1

## 2017-11-20 MED ORDER — HYDRALAZINE HCL 25 MG PO TABS
25.0000 mg | ORAL_TABLET | Freq: Three times a day (TID) | ORAL | Status: DC
  Administered 2017-11-20: 21:00:00 25 mg via ORAL
  Filled 2017-11-20 (×2): qty 1

## 2017-11-20 SURGICAL SUPPLY — 13 items
CABLE SURGICAL S-101-97-12 (CABLE) ×2 IMPLANT
CATH RIGHTSITE C315HIS02 (CATHETERS) ×6 IMPLANT
IPG PACE AZUR XT DR MRI W1DR01 (Pacemaker) ×1 IMPLANT
LEAD CAPSURE NOVUS 5076-52CM (Lead) ×2 IMPLANT
LEAD SELECT SECURE 3830 383059 (Lead) ×1 IMPLANT
PACE AZURE XT DR MRI W1DR01 (Pacemaker) ×2 IMPLANT
PAD DEFIB LIFELINK (PAD) ×2 IMPLANT
SELECT SECURE 3830 383059 (Lead) ×2 IMPLANT
SHEATH CLASSIC 7F (SHEATH) ×4 IMPLANT
SLITTER 6230UNI (MISCELLANEOUS) ×2 IMPLANT
SLITTER 6232ADJ (MISCELLANEOUS) ×2 IMPLANT
TRAY PACEMAKER INSERTION (PACKS) ×2 IMPLANT
WIRE HI TORQ VERSACORE-J 145CM (WIRE) ×2 IMPLANT

## 2017-11-20 NOTE — Progress Notes (Signed)
Noted patients monitor had improper sensing and pacing after QRS intermittently. Episodes of wide complex rhythm noted at a rate approximately 90 intermittently. Paged Dr. Curt Bears and Dr. Rayann Heman. Calls returned and reported above. Pacemaker rep into evaluate rhythm and programming and chest xray done at bedside. Pacemaker reprogrammed and information sent to Dr. Curt Bears by pacemaker rep. Wide complex rhythm was evaluated as p tracing by rep. Rep stated there may be some improper pacing during the night but unless it happens frequently they can reevaluated it in the morning. Patient has been asymptomatic with all rhythms this shift.

## 2017-11-20 NOTE — Discharge Summary (Signed)
ELECTROPHYSIOLOGY PROCEDURE DISCHARGE SUMMARY    Patient ID: Shannon Lucas,  MRN: [id number], DOB/AGE: 1931-10-16 84 y.o.  Admit date: 11/20/2017 Discharge date: 11/21/16  Primary Care Physician: Shannon Dresser, MD  Primary Cardiologist: Dr. Aundra Dubin Electrophysiologist: Dr. Curt Bears  Primary Discharge Diagnosis:  1. Tachy-brady syndrome  Secondary Discharge Diagnosis:  1. NICM 2. PAFib, CHA2DS2Vasc is 5, on xarelto 3. HTN  No Known Allergies   Procedures This Admission:  1.  Implantation of a MDT dual chamber PPM on 11/20/17 by Dr Curt Bears.  The patient received a Medtronic model C338645 (serial number [id number]) right atrial lead and a Medtronic model 3830(serial number [id number]) right ventricular lead (HIS position), Medtronic Azure XT DR MRI SureScan (serial number [id number]) pacemaker There were no immediate post procedure complications. 2.  CXR on 11/21/17 demonstrated no pneumothorax status post device implantation.   Brief HPI: Shannon Lucas is a 84 y.o. female was referred to electrophysiology in the outpatient setting for consideration of PPM implantation.  Past medical history is noted above.  The patient has tachy-brady syndrome.  Risks, benefits, and alternatives to PPM implantation were reviewed with the patient who wished to proceed.   Hospital Course:  The patient was admitted and underwent implantation of a PPM with details as outlined above.  She was monitored on telemetry overnight which demonstrated AFib with rate dependent BBB.  Left chest was without hematoma or ecchymosis.  The device was interrogated and found to be functioning normally.  CXR was obtained and demonstrated no pneumothorax status post device implantation.  Wound care, arm mobility, and restrictions were reviewed with the patient.  The patient was examined by Dr. Curt Bears and considered stable for discharge to home.  Shannon Lucas start Toprol XL 50mg  daily now post pacemaker implant for rate control and  BP.   Physical Exam: Vitals:   11/21/17 0400 11/21/17 0423 11/21/17 0500 11/21/17 0700  BP:  (!) 141/67  (!) 148/60  Pulse:  94  100  Resp: (!) 28 20 (!) 25 18  Temp:  98.4 F (36.9 C)  98 F (36.7 C)  TempSrc:  Oral  Oral  SpO2:  93%  93%  Weight:  205 lb 11 oz (93.3 kg)    Height:  5' 5.5" (1.664 m)      GEN- The patient is well appearing, alert and oriented x 3 today.   HEENT: normocephalic, atraumatic; sclera clear, conjunctiva pink; hearing intact; oropharynx clear; neck supple, no JVP Lungs- CTA b/l, normal work of breathing.  No wheezes, rales, rhonchi Heart- iRRR, no murmurs, rubs or gallops, PMI not laterally displaced GI- soft, non-tender, non-distended Extremities- no clubbing, cyanosis, or edema MS- no significant deformity or atrophy Skin- warm and dry, no rash or lesion, left chest without hematoma/ecchymosis Psych- euthymic mood, full affect Neuro- no gross deficits   Labs:   Lab Results  Component Value Date   WBC 7.2 11/16/2017   HGB 12.2 11/16/2017   HCT 37.2 11/16/2017   MCV 94 11/16/2017   PLT 226 11/16/2017    Recent Labs  Lab 11/16/17 1210  NA 143  K 4.0  CL 106  CO2 22  BUN 23  CREATININE 1.52*  CALCIUM 8.9  GLUCOSE 71    Discharge Medications:  Allergies as of 11/21/2017   No Known Allergies     Medication List    STOP taking these medications   losartan 100 MG tablet Commonly known as:  COZAAR     TAKE these  medications   acetaminophen 500 MG tablet Commonly known as:  TYLENOL Take 1,000 mg by mouth every 6 (six) hours as needed for moderate pain or headache.   amiodarone 200 MG tablet Commonly known as:  PACERONE Take 200 mg by mouth at bedtime. What changed:  Another medication with the same name was removed. Continue taking this medication, and follow the directions you see here.   benzonatate 100 MG capsule Commonly known as:  TESSALON Take 100 mg by mouth 2 (two) times daily.   ergocalciferol 50000 units  capsule Commonly known as:  VITAMIN D2 Take 50,000 Units by mouth every Friday. At [facility]   ferrous sulfate 325 (65 FE) MG tablet Take 1 tablet (325 mg total) by mouth 3 (three) times daily with meals.   furosemide 40 MG tablet Commonly known as:  LASIX Take 40 mg by mouth daily.   hydrALAZINE 25 MG tablet Commonly known as:  APRESOLINE Take 1 tablet (25 mg total) by mouth 3 (three) times daily.   ipratropium 17 MCG/ACT inhaler Commonly known as:  ATROVENT HFA Inhale 2 puffs into the lungs every 6 (six) hours as needed for wheezing.   isosorbide mononitrate 30 MG 24 hr tablet Commonly known as:  IMDUR Take 30 mg by mouth at bedtime.   LANTUS SOLOSTAR 100 UNIT/ML Solostar Pen Generic drug:  Insulin Glargine Inject 50 Units into the skin daily.   metoprolol succinate 50 MG 24 hr tablet Commonly known as:  TOPROL XL Take 1 tablet (50 mg total) by mouth daily. Take with or immediately following a meal.   pravastatin 40 MG tablet Commonly known as:  PRAVACHOL Take 40 mg by mouth at bedtime.   sacubitril-valsartan 24-26 MG Commonly known as:  ENTRESTO Take 1 tablet by mouth 2 (two) times daily.   venlafaxine 75 MG tablet Commonly known as:  EFFEXOR Take 75 mg by mouth daily.   XARELTO 15 MG Tabs tablet Generic drug:  Rivaroxaban Take 1 tablet (15 mg total) by mouth daily. What changed:  when to take this       Disposition:  Home  Discharge Instructions    Diet - low sodium heart healthy   Complete by:  As directed    Increase activity slowly   Complete by:  As directed      Follow-up Information    [facility] Follow up on 11/30/2017.   Specialty:  Cardiology Why:  2:00PM, wound check visit Contact information: [address], [address] [place]       Shannon Haw, MD Follow up on 01/29/2018.   Specialty:  Cardiology Why:  2:30PM Contact information: [address]  [place] [address] [phone]        Shannon Dresser, MD Follow up on 12/08/2017.   Specialty:  Cardiology Why:  2:00PM Contact information: [place] [address] [phone]           Duration of Discharge Encounter: Greater than 30 minutes including physician time.  Shannon Night, PA-C 11/21/2017 9:26 AM  I have seen and examined this patient with Tommye Standard.  Agree with above, note added to reflect my findings.  On exam, RRR, no murmurs, lungs clear.  Dual-chamber pacemaker implanted yesterday with his lead.  Device function and chest x-ray without major abnormalities.  We Loukisha Gunnerson plan for discharge today on increased dose of metoprolol for rate control due to atrial fibrillation.  Gillian Meeuwsen M. Caroline Matters MD 11/21/2017 10:58 AM

## 2017-11-20 NOTE — Discharge Instructions (Signed)
° ° °  Supplemental Discharge Instructions for  Pacemaker/Defibrillator Patients  Activity No heavy lifting or vigorous activity with your left/right arm for 6 to 8 weeks.  Do not raise your left/right arm above your head for one week.  Gradually raise your affected arm as drawn below.              11/24/17                     11/25/17                    11/26/17                  11/27/17  __  NO DRIVING for  1 week ; you may begin driving on  9/40/76 .  WOUND CARE - Keep the wound area clean and dry.  Do not get this area wet, no showers until cleared to at your wound check visit. - The tape/steri-strips on your wound will fall off; do not pull them off.  No bandage is needed on the site.  DO  NOT apply any creams, oils, or ointments to the wound area. - If you notice any drainage or discharge from the wound, any swelling or bruising at the site, or you develop a fever > 101? F after you are discharged home, call the office at once.  Special Instructions - You are still able to use cellular telephones; use the ear opposite the side where you have your pacemaker/defibrillator.  Avoid carrying your cellular phone near your device. - When traveling through airports, show security personnel your identification card to avoid being screened in the metal detectors.  Ask the security personnel to use the hand wand. - Avoid arc welding equipment, MRI testing (magnetic resonance imaging), TENS units (transcutaneous nerve stimulators).  Call the office for questions about other devices. - Avoid electrical appliances that are in poor condition or are not properly grounded. - Microwave ovens are safe to be near or to operate.  Additional information for defibrillator patients should your device go off: - If your device goes off ONCE and you feel fine afterward, notify the device clinic nurses. - If your device goes off ONCE and you do not feel well afterward, call 911. - If your device goes off TWICE, call  911. - If your device goes off THREE times in one day, call 911.  DO NOT DRIVE YOURSELF OR A FAMILY MEMBER WITH A DEFIBRILLATOR TO THE HOSPITAL--CALL 911.

## 2017-11-20 NOTE — H&P (Signed)
Electrophysiology Office Note   Date:  11/20/2017   ID:  Shannon Lucas, DOB [birth year], MRN [id number]  PCP:  Shannon Dresser, MD  Cardiologist:  Shannon Lucas Primary Electrophysiologist:  Shannon Biehn Meredith Leeds, MD    No chief complaint on file.    History of Present Illness: Shannon Lucas is a 84 y.o. female who is being seen today for the evaluation of CHF at the request of No ref. provider found. Presenting today for electrophysiology evaluation.  She has a history of nonischemic cardiomyopathy, paroxysmal atrial fibrillation on Xarelto and amiodarone, stage III CKD, type 2 diabetes, hyperlipidemia, CVA, and coronary artery disease.  She is currently on Entresto.  She had a recent echocardiogram which showed an improvement of her ejection fraction to 35-40% from 25-30%.    Today, denies symptoms of palpitations, chest pain, shortness of breath, orthopnea, PND, lower extremity edema, claudication, dizziness, presyncope, syncope, bleeding, or neurologic sequela. The patient is tolerating medications without difficulties.     Past Medical History:  Diagnosis Date  . A-fib ([place])   . Arthritis    "a little bit qwhere" (02/28/2017)  . CHF (congestive heart failure) ([place])   . CKD (chronic kidney disease), stage III ([place])    [name] 02/28/2017  . Depression   . GERD (gastroesophageal reflux disease)   . High cholesterol   . Hypertension   . Melanoma of back ([place])   . Myocardial infarction [facility])    "one dr said I'd had 1" (02/28/2017)  . Stroke [facility]) ~ 2010; ~2012   "affected the right side but I fully recovered; just a light one" (02/28/2017)  . Type II diabetes mellitus ([place])    Past Surgical History:  Procedure Laterality Date  . APPENDECTOMY    . FALSE ANEURYSM REPAIR Right 03/08/2017   Procedure: REPAIR RIGHT RADIAL FALSE ANEURYSM;  Surgeon: Rosetta Posner, MD;  Location: [facility];  Service: Vascular;  Laterality: Right;  . KNEE SURGERY Left 1970s   "I have 1/3 of my knee left in there"  .  LAPAROSCOPIC CHOLECYSTECTOMY    . MELANOMA EXCISION     "back"  . RIGHT/LEFT HEART CATH AND CORONARY ANGIOGRAPHY N/A 03/06/2017   Procedure: Right/Left Heart Cath and Coronary Angiography;  Surgeon: Shannon Dresser, MD;  Location: [facility] CV LAB;  Service: Cardiovascular;  Laterality: N/A;  . TUBAL LIGATION       Current Facility-Administered Medications  Medication Dose Route Frequency Provider Last Rate Last Dose  . 0.9 %  sodium chloride infusion   Intravenous Continuous Izella Ybanez Hassell Done, MD      . ceFAZolin (ANCEF) IVPB 2g/100 mL premix  2 g Intravenous On Call Cheyna Retana Hassell Done, MD      . gentamicin (GARAMYCIN) 80 mg in sodium chloride irrigation 0.9 % 500 mL irrigation  80 mg Irrigation On Call Fradel Baldonado Hassell Done, MD      . mupirocin ointment (BACTROBAN) 2 % 1 application  1 application Topical Once Coty Larsh Hassell Done, MD      . mupirocin ointment (BACTROBAN) 2 %             Allergies:   Patient has no known allergies.   Social History:  The patient  reports that she has quit smoking. she has never used smokeless tobacco. She reports that she does not drink alcohol or use drugs.   Family History:  The patient's family history includes Diabetes in her mother; Parkinson's disease in her brother; Stroke in her father.  ROS:  Please see the history of present illness.   Otherwise, review of systems is positive for none.   All other systems are reviewed and negative.    PHYSICAL EXAM: VS:  BP (!) 164/56 (BP Location: Right Arm)   Pulse (!) 57   Temp 97.8 F (36.6 C) (Oral)   Ht 5' 5.5" (1.664 m)   Wt 207 lb (93.9 kg)   SpO2 100%   BMI 33.92 kg/m  , BMI Body mass index is 33.92 kg/m. GEN: Well nourished, well developed, in no acute distress  HEENT: normal  Neck: no JVD, carotid bruits, or masses Cardiac: RRR; no murmurs, rubs, or gallops,no edema  Respiratory:  clear to auscultation bilaterally, normal work of breathing GI: soft, nontender, nondistended, +  BS MS: no deformity or atrophy  Skin: warm and dry Neuro:  Strength and sensation are intact Psych: euthymic mood, full affect   EKG:  EKG is ordered today. Personal review of the ekg ordered shows sinus rhythm, rate 47, nonspecific T wave changes  Recent Labs: 07/14/2017: TSH 2.847 08/14/2017: ALT 16; B Natriuretic Peptide 620.4 11/16/2017: BUN 23; Creatinine, Ser 1.52; Hemoglobin 12.2; Platelets 226; Potassium 4.0; Sodium 143    Lipid Panel     Component Value Date/Time   CHOL 147 07/14/2017 1109   TRIG 66 07/14/2017 1109   HDL 51 07/14/2017 1109   CHOLHDL 2.9 07/14/2017 1109   VLDL 13 07/14/2017 1109   LDLCALC 83 07/14/2017 1109     Wt Readings from Last 3 Encounters:  11/20/17 207 lb (93.9 kg)  09/29/17 209 lb (94.8 kg)  09/04/17 206 lb 12.8 oz (93.8 kg)      Other studies Reviewed: Additional studies/ records that were reviewed today include: TTE 08/14/17  Review of the above records today demonstrates:  - Left ventricle: LVEF is depressed at approximately 35 to 40% with   diffuse hypokinesis. The cavity size was mildly dilated. Wall   thickness was increased in a pattern of mild LVH. The study is   not technically sufficient to allow evaluation of LV diastolic   function. - Aortic valve: AV is thickened, calcified with very mildly   restricted motion. - Mitral valve: Calcified annulus. Mildly thickened leaflets .   There was mild regurgitation. - Left atrium: The atrium was mildly dilated. - Right ventricle: Systolic function was mildly reduced. - Right atrium: The atrium was mildly dilated. - Pulmonary arteries: PA peak pressure: 50 mm Hg (S). - Impressions: Frequent pauses during study.  Holter 09/06/17 - personally reviewed 1. Abnormal holter.  2. Atrial fibrillation at times with RVR noted.  3. I am concerned that there are runs of VT (not just afib with aberrancy).  4. Up to 2 second pauses.    ASSESSMENT AND PLAN:  1.  Chronic systolic heart  failure due to nonischemic cardiomyopathy: Was put on Entresto with improvement in her LV ejection fraction.  Would continue her optimal medical therapy.  2.  Paroxysmal atrial fibrillation: Continue amiodarone and Xarelto.  This patients CHA2DS2-VASc Score and unadjusted Ischemic Stroke Rate (% per year) is equal to 7.2 % stroke rate/year from a score of 5  Above score calculated as 1 point each if present [CHF, HTN, DM, Vascular=MI/PAD/Aortic Plaque, Age if 65-74, or Female] Above score calculated as 2 points each if present [Age > 75, or Stroke/TIA/TE]  3.  Tachybradycardia syndrome: HR have been low in sinus rhythm and tachycardic in AF. Plan for pacemaker implant. Risks and benefits discussed.  Risks include but not limited to bleeding, infection, tamponade, pneumothorax. The patient understands the risks and has agreed to the procedure.

## 2017-11-21 ENCOUNTER — Other Ambulatory Visit: Payer: Self-pay

## 2017-11-21 ENCOUNTER — Encounter (HOSPITAL_COMMUNITY): Payer: Self-pay | Admitting: *Deleted

## 2017-11-21 ENCOUNTER — Ambulatory Visit (HOSPITAL_COMMUNITY): Payer: Medicare Other

## 2017-11-21 DIAGNOSIS — I495 Sick sinus syndrome: Secondary | ICD-10-CM | POA: Diagnosis not present

## 2017-11-21 DIAGNOSIS — N183 Chronic kidney disease, stage 3 (moderate): Secondary | ICD-10-CM | POA: Diagnosis not present

## 2017-11-21 DIAGNOSIS — I130 Hypertensive heart and chronic kidney disease with heart failure and stage 1 through stage 4 chronic kidney disease, or unspecified chronic kidney disease: Secondary | ICD-10-CM | POA: Diagnosis not present

## 2017-11-21 DIAGNOSIS — I5022 Chronic systolic (congestive) heart failure: Secondary | ICD-10-CM | POA: Diagnosis not present

## 2017-11-21 LAB — GLUCOSE, CAPILLARY: GLUCOSE-CAPILLARY: 170 mg/dL — AB (ref 65–99)

## 2017-11-21 MED ORDER — METOPROLOL SUCCINATE ER 50 MG PO TB24: 50.0000 mg | ORAL_TABLET | Freq: Every day | ORAL | 11 refills | Status: DC

## 2017-11-21 MED ORDER — YOU HAVE A PACEMAKER BOOK
Freq: Once | Status: AC
  Administered 2017-11-21: 05:00:00
  Filled 2017-11-21: qty 1

## 2017-11-21 MED FILL — Gentamicin Sulfate Inj 40 MG/ML: INTRAMUSCULAR | Qty: 80 | Status: AC

## 2017-11-30 ENCOUNTER — Ambulatory Visit (INDEPENDENT_AMBULATORY_CARE_PROVIDER_SITE_OTHER): Payer: Medicare Other | Admitting: *Deleted

## 2017-11-30 DIAGNOSIS — I495 Sick sinus syndrome: Secondary | ICD-10-CM

## 2017-11-30 LAB — CUP PACEART INCLINIC DEVICE CHECK
Brady Statistic AP VS Percent: 83.26 %
Brady Statistic AS VP Percent: 0.09 %
Brady Statistic AS VS Percent: 0.44 %
Brady Statistic RV Percent Paced: 21.52 %
Date Time Interrogation Session: 20190117154727
Implantable Lead Location: 753859
Implantable Lead Location: 753860
Implantable Lead Model: 3830
Lead Channel Impedance Value: 266 Ohm
Lead Channel Impedance Value: 380 Ohm
Lead Channel Impedance Value: 437 Ohm
Lead Channel Pacing Threshold Amplitude: 2.75 V
Lead Channel Pacing Threshold Pulse Width: 1 ms
Lead Channel Sensing Intrinsic Amplitude: 1.75 mV
Lead Channel Setting Pacing Amplitude: 3.5 V
Lead Channel Setting Pacing Pulse Width: 1 ms
MDC IDC LEAD IMPLANT DT: 20190107
MDC IDC LEAD IMPLANT DT: 20190107
MDC IDC MSMT BATTERY REMAINING LONGEVITY: 111 mo
MDC IDC MSMT BATTERY VOLTAGE: 3.17 V
MDC IDC MSMT LEADCHNL RA IMPEDANCE VALUE: 361 Ohm
MDC IDC MSMT LEADCHNL RA PACING THRESHOLD AMPLITUDE: 1.25 V
MDC IDC MSMT LEADCHNL RA PACING THRESHOLD PULSEWIDTH: 0.4 ms
MDC IDC PG IMPLANT DT: 20190107
MDC IDC SET LEADCHNL RV PACING AMPLITUDE: 5 V
MDC IDC SET LEADCHNL RV SENSING SENSITIVITY: 0.9 mV
MDC IDC STAT BRADY AP VP PERCENT: 16.39 %
MDC IDC STAT BRADY RA PERCENT PACED: 21.55 %

## 2017-11-30 NOTE — Progress Notes (Signed)
Wound check appointment. Steri-strips removed. Wound without redness or edema. Incision edges approximated, wound well healed. Normal device function. Thresholds, sensing, and impedances consistent with implant measurements. Device programmed RA at 3.5V and RV at 5.0V for extra safety margin until 3 month visit. Histogram distribution appropriate for patient and level of activity. 4 mode switches (82.8%)+Xarelto, longest >99 hours, Max A rate 333 bpm, Max V rate 109 bpm. No high ventricular rates noted. Patient educated about wound care, arm mobility, lifting restrictions. ROV with WC 01/29/18.

## 2017-12-07 ENCOUNTER — Ambulatory Visit: Payer: Medicare Other

## 2017-12-08 ENCOUNTER — Ambulatory Visit (HOSPITAL_COMMUNITY)
Admission: RE | Admit: 2017-12-08 | Discharge: 2017-12-08 | Disposition: A | Discharge status: Home / Self Care | Payer: Medicare Other | Source: Ambulatory Visit | Attending: Cardiology | Admitting: Cardiology

## 2017-12-08 VITALS — BP 153/69 | HR 60 | Wt 210.4 lb

## 2017-12-08 DIAGNOSIS — I5022 Chronic systolic (congestive) heart failure: Secondary | ICD-10-CM

## 2017-12-08 DIAGNOSIS — Z794 Long term (current) use of insulin: Secondary | ICD-10-CM | POA: Diagnosis not present

## 2017-12-08 DIAGNOSIS — I060 Rheumatic aortic stenosis: Secondary | ICD-10-CM | POA: Diagnosis not present

## 2017-12-08 DIAGNOSIS — Z8673 Personal history of transient ischemic attack (TIA), and cerebral infarction without residual deficits: Secondary | ICD-10-CM | POA: Diagnosis not present

## 2017-12-08 DIAGNOSIS — Z9889 Other specified postprocedural states: Secondary | ICD-10-CM | POA: Insufficient documentation

## 2017-12-08 DIAGNOSIS — D631 Anemia in chronic kidney disease: Secondary | ICD-10-CM | POA: Insufficient documentation

## 2017-12-08 DIAGNOSIS — Z87891 Personal history of nicotine dependence: Secondary | ICD-10-CM | POA: Diagnosis not present

## 2017-12-08 DIAGNOSIS — I495 Sick sinus syndrome: Secondary | ICD-10-CM | POA: Diagnosis not present

## 2017-12-08 DIAGNOSIS — Z95810 Presence of automatic (implantable) cardiac defibrillator: Secondary | ICD-10-CM | POA: Insufficient documentation

## 2017-12-08 DIAGNOSIS — I130 Hypertensive heart and chronic kidney disease with heart failure and stage 1 through stage 4 chronic kidney disease, or unspecified chronic kidney disease: Secondary | ICD-10-CM | POA: Diagnosis not present

## 2017-12-08 DIAGNOSIS — Z79899 Other long term (current) drug therapy: Secondary | ICD-10-CM | POA: Insufficient documentation

## 2017-12-08 DIAGNOSIS — I2510 Atherosclerotic heart disease of native coronary artery without angina pectoris: Secondary | ICD-10-CM | POA: Insufficient documentation

## 2017-12-08 DIAGNOSIS — E785 Hyperlipidemia, unspecified: Secondary | ICD-10-CM | POA: Diagnosis not present

## 2017-12-08 DIAGNOSIS — Z7902 Long term (current) use of antithrombotics/antiplatelets: Secondary | ICD-10-CM | POA: Insufficient documentation

## 2017-12-08 DIAGNOSIS — R001 Bradycardia, unspecified: Secondary | ICD-10-CM | POA: Diagnosis not present

## 2017-12-08 DIAGNOSIS — I5023 Acute on chronic systolic (congestive) heart failure: Secondary | ICD-10-CM | POA: Insufficient documentation

## 2017-12-08 DIAGNOSIS — I429 Cardiomyopathy, unspecified: Secondary | ICD-10-CM | POA: Diagnosis not present

## 2017-12-08 DIAGNOSIS — I480 Paroxysmal atrial fibrillation: Secondary | ICD-10-CM | POA: Diagnosis not present

## 2017-12-08 DIAGNOSIS — N183 Chronic kidney disease, stage 3 unspecified: Secondary | ICD-10-CM

## 2017-12-08 LAB — COMPREHENSIVE METABOLIC PANEL
ALT: 16 U/L (ref 14–54)
AST: 21 U/L (ref 15–41)
Albumin: 3.5 g/dL (ref 3.5–5.0)
Alkaline Phosphatase: 99 U/L (ref 38–126)
Anion gap: 10 (ref 5–15)
BUN: 30 mg/dL — ABNORMAL HIGH (ref 6–20)
CHLORIDE: 108 mmol/L (ref 101–111)
CO2: 24 mmol/L (ref 22–32)
Calcium: 9.1 mg/dL (ref 8.9–10.3)
Creatinine, Ser: 1.62 mg/dL — ABNORMAL HIGH (ref 0.44–1.00)
GFR, EST AFRICAN AMERICAN: 33 mL/min — AB (ref >60)
GFR, EST NON AFRICAN AMERICAN: 28 mL/min — AB (ref >60)
Glucose, Bld: 157 mg/dL — ABNORMAL HIGH (ref 65–99)
Potassium: 4 mmol/L (ref 3.5–5.1)
SODIUM: 142 mmol/L (ref 135–145)
Total Bilirubin: 0.7 mg/dL (ref 0.3–1.2)
Total Protein: 6.2 g/dL — ABNORMAL LOW (ref 6.5–8.1)

## 2017-12-08 LAB — TSH: TSH: 3.927 u[IU]/mL (ref 0.350–4.500)

## 2017-12-08 MED ORDER — METOPROLOL SUCCINATE ER 25 MG PO TB24: 75.0000 mg | ORAL_TABLET | Freq: Every day | ORAL | 11 refills | Status: DC

## 2017-12-08 NOTE — Patient Instructions (Addendum)
Increase Metoprolol XL 75 mg (3 tab) daily  Labs drawn today (if we do not call you, then your lab work was stable)   Your physician recommends that you schedule a follow-up appointment in: 3 months with Dr. Aundra Dubin

## 2017-12-10 NOTE — Progress Notes (Signed)
Advanced Heart Failure Clinic Note   Primary Care: Shannon Lucas, [place] HF Cardiology: Dr. Aundra Lucas  HPI:  Shannon Lucas is a 84 y.o. female with PMH of Systolic CHF,  PAF on Xarelto, CKD stage III, DM II, GERD, HLD, CVA, anemia.   Admitted 4/17 -> 8/56/31 with A/C systolic CHF. L/RHC as below with normal coronaries and elevated filling pressures. Diuresed 5 lbs. Hospital course complicated by hematoma at radial cath site that improved overnight. Medications adjusted as tolerated. Discharge weight 205 lbs.    Pt seen in ED 03/08/17 for worsening of hematoma as above.  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 Dr. Donnetta Lucas saw in consult and repaired under local anaesthesia and sedation.   Echo in 10/18 showed EF 35-40% with diffuse hypokinesis, PASP 50 mmHg.  Holter in 10/18 showed runs of atrial fibrillation including afib with aberrancy.  Bradycardia into the 30s was also noted.  Decision was made to place PPM => patient got Medtronic device with His bundle lead and is now His pacing.    She presents today for followup of CHF.  She feels better since pacemaker placed. No palpitations. No dyspnea walking on flat ground with walker. Able to get around stores.  No orthopnea/PND.  No chest pain.  Dyspnea if she walks fast or goes up inclines.    ECG (personally reviewed): A-V sequential pacing  Labs (04/06/17): K 5.0, Creatinine 2.04, BUN 43, LFTs normal Labs (6/18): K 3.9, creatinine 1.68 Labs (8/18): LDL 83, HDL 51, hgb 11.1, K 4.5, creatinine 1.48, LFTs normal, TSH normal.  Labs (1/19): K 4, creatinine 1.5, hgb 12.2  Review of systems complete and found to be negative unless listed in HPI.    PMH 1. Chronic systolic CHF: Nonischemic cardiomyopathy.  - Echo 03/01/17 LVEF 25-30%, At least mild AS, Mild MR, Mod LAE, Mod RAE, Trivial PI, PA peak pressure 42 mm Hg. - LHC (4/18) with nonobstructive CAD.  - Echo (10/18): EF 35-40%, diffuse hypokinesis, mild LVH, mild MR, mild AS, PASP 50 mmHg.  - Medtronic PPM  with His bundle lead placed.  2. Atrial fibrillation: Paroxysmal. On Xarelto and amiodarone.  3. CKD Stage III 4. DMII 5. GERD 6. HLD 7. Hx of CVA - ?2011-2012 with no lasting deficit 8. Chronic anemia 9. CAD: LHC (4/18) with 70% ostial D1, 50% ostial D2, 40% ostial OM1.  10. Aortic stenosis: Mild on echo in 10/18.  11. 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   Current Outpatient Medications  Medication Sig Dispense Refill  . acetaminophen (TYLENOL) 500 MG tablet Take 1,000 mg by mouth every 6 (six) hours as needed for moderate pain or headache.    Marland Kitchen amiodarone (PACERONE) 200 MG tablet Take 200 mg by mouth at bedtime.    . benzonatate (TESSALON) 100 MG capsule Take 100 mg by mouth 2 (two) times daily.    . ergocalciferol (VITAMIN D2) 50000 units capsule Take 50,000 Units by mouth every Friday. At night    . ferrous sulfate 325 (65 FE) MG tablet Take 1 tablet (325 mg total) by mouth 3 (three) times daily with meals. 90 tablet 0  . furosemide (LASIX) 40 MG tablet Take 40 mg by mouth daily.     . hydrALAZINE (APRESOLINE) 25 MG tablet Take 1 tablet (25 mg total) by mouth 3 (three) times daily. 90 tablet 3  . ipratropium (ATROVENT HFA) 17 MCG/ACT inhaler Inhale 2 puffs into the lungs every 6 (six) hours as needed for wheezing. 1 Inhaler 12  .  isosorbide mononitrate (IMDUR) 30 MG 24 hr tablet Take 30 mg by mouth at bedtime.   2  . LANTUS SOLOSTAR 100 UNIT/ML Solostar Pen Inject 50 Units into the skin daily.   3  . metoprolol succinate (TOPROL-XL) 25 MG 24 hr tablet Take 3 tablets (75 mg total) by mouth daily. Take with or immediately following a meal. 90 tablet 11  . pravastatin (PRAVACHOL) 40 MG tablet Take 40 mg by mouth at bedtime.   2  . sacubitril-valsartan (ENTRESTO) 24-26 MG Take 1 tablet by mouth 2 (two) times daily. 60 tablet 3  . venlafaxine (EFFEXOR) 75 MG tablet Take 75 mg by mouth daily.     [name] 15 MG TABS tablet Take 1 tablet (15 mg total) by mouth daily. (Patient  taking differently: Take 15 mg by mouth daily with supper. ) 42 tablet 4   No current facility-administered medications for this encounter.    No Known Allergies   Social History   Socioeconomic History  . Marital status: Widowed    Spouse name: Not on file  . Number of children: Not on file  . Years of education: Not on file  . Highest education level: Not on file  Social Needs  . Financial resource strain: Not on file  . Food insecurity - worry: Not on file  . Food insecurity - inability: Not on file  . Transportation needs - medical: Not on file  . Transportation needs - non-medical: Not on file  Occupational History  . Not on file  Tobacco Use  . Smoking status: Former Research scientist (life sciences)  . Smokeless tobacco: Never Used  . Tobacco comment: 02/28/2017 "only smoked in the 1960s when we went out"  Substance and Sexual Activity  . Alcohol use: No  . Drug use: No  . Sexual activity: No  Other Topics Concern  . Not on file  Social History Narrative  . Not on file   Family history No family history of premature CAD or CHF.   Vitals:   12/08/17 1404  BP: (!) 153/69  Pulse: 60  SpO2: 97%  Weight: 210 lb 6.4 oz (95.4 kg)   Wt Readings from Last 3 Encounters:  12/08/17 210 lb 6.4 oz (95.4 kg)  11/21/17 205 lb 11 oz (93.3 kg)  09/29/17 209 lb (94.8 kg)     PHYSICAL EXAM: General: NAD Neck: No JVD, no thyromegaly or thyroid nodule.  Lungs: Clear to auscultation bilaterally with normal respiratory effort. CV: Nondisplaced PMI.  Heart regular S1/S2, no S3/S4, no murmur.  No peripheral edema.  No carotid bruit.  Normal pedal pulses.  Abdomen: Soft, nontender, no hepatosplenomegaly, no distention.  Skin: Intact without lesions or rashes.  Neurologic: Alert and oriented x 3.  Psych: Normal affect. Extremities: No clubbing or cyanosis.  HEENT: Normal.   ASSESSMENT & PLAN:  1. Chronic systolic HF:  Echo with EF 25-30% 02/2017, repeat echo 10/18 showed EF 35-40%. Nonischemic  cardiomyopathy. Medtronic device with His bundle lead, pacing appropriately.  NYHA II-III currently. She is not volume overloaded. - Continue Lasix 40 mg daily.   - Continue Entresto 24/26 bid.  - Off spironolactone with AKI and hyperkalemia, may be able to restart in the future.  - Increase Toprol XL to 75 mg daily.    - Continue current hydralazine/Imdur, will try to titrate off these at future appts and increase Entresto/start spironolactone if renal function allows. - BMET today.   2. CKD: Stage III. BMET today.  3. PAF: NSR today.   -  Continue amiodarone 200 mg daily.  Check LFTs/TSH.  She will need regular eye exams.   - Continue Xarelto.  4. CAD: Nonobstructive on 4/18 cath.   - Continue pravastatin, acceptable lipids in 8/18.   - No ASA given stable CAD and Xarelto use.  5. Bradycardia: Now s/p Medtronic PPM with His bundle pacing.  6. HTN: Increasing Toprol XL as above.   Followup in 3 months.  Loralie Champagne, MD 12/10/17

## 2018-01-03 ENCOUNTER — Other Ambulatory Visit (HOSPITAL_COMMUNITY): Payer: Self-pay | Admitting: Cardiology

## 2018-01-10 ENCOUNTER — Other Ambulatory Visit (HOSPITAL_COMMUNITY): Payer: Self-pay | Admitting: *Deleted

## 2018-01-10 MED ORDER — FUROSEMIDE 40 MG PO TABS: 40.0000 mg | ORAL_TABLET | Freq: Every day | ORAL | 3 refills | Status: DC

## 2018-01-10 MED ORDER — SACUBITRIL-VALSARTAN 24-26 MG PO TABS: 1.0000 | ORAL_TABLET | Freq: Two times a day (BID) | ORAL | 3 refills | Status: DC

## 2018-01-10 MED ORDER — METOPROLOL SUCCINATE ER 25 MG PO TB24: 75.0000 mg | ORAL_TABLET | Freq: Every day | ORAL | 11 refills | Status: DC

## 2018-01-10 MED ORDER — HYDRALAZINE HCL 25 MG PO TABS: 25.0000 mg | ORAL_TABLET | Freq: Three times a day (TID) | ORAL | 3 refills | Status: DC

## 2018-01-10 MED ORDER — AMIODARONE HCL 200 MG PO TABS: 200.0000 mg | ORAL_TABLET | Freq: Every day | ORAL | 3 refills | Status: DC

## 2018-01-29 ENCOUNTER — Ambulatory Visit: Payer: Medicare Other | Admitting: Cardiology

## 2018-01-29 ENCOUNTER — Encounter: Payer: Self-pay | Admitting: Cardiology

## 2018-01-29 VITALS — BP 120/80 | HR 80 | Ht 65.0 in | Wt 213.0 lb

## 2018-01-29 DIAGNOSIS — I495 Sick sinus syndrome: Secondary | ICD-10-CM | POA: Diagnosis not present

## 2018-01-29 DIAGNOSIS — I480 Paroxysmal atrial fibrillation: Secondary | ICD-10-CM

## 2018-01-29 DIAGNOSIS — I428 Other cardiomyopathies: Secondary | ICD-10-CM

## 2018-01-29 LAB — CUP PACEART INCLINIC DEVICE CHECK
Implantable Lead Implant Date: 20190107
Implantable Lead Implant Date: 20190107
Implantable Lead Location: 753860
Implantable Pulse Generator Implant Date: 20190107
MDC IDC LEAD LOCATION: 753859
MDC IDC SESS DTM: 20190318172507

## 2018-01-29 NOTE — Progress Notes (Signed)
Electrophysiology Office Note   Date:  01/29/2018   ID:  Shannon Lucas, DOB [birth year], MRN [id number]  PCP:  Larey Dresser, MD  Cardiologist:  Aundra Dubin Primary Electrophysiologist:  Bonnie Roig Meredith Leeds, MD    Chief Complaint  Patient presents with  . Pacemaker Check    Tachycardia-bradycardia syndrome     History of Present Illness: Shannon Lucas is a 84 y.o. female who is being seen today for the evaluation of CHF at the request of Larey Dresser, MD. Presenting today for electrophysiology evaluation.  She has a history of nonischemic cardiomyopathy, paroxysmal atrial fibrillation on Xarelto and amiodarone, stage III CKD, type 2 diabetes, hyperlipidemia, CVA, and coronary artery disease.  She is currently on Entresto.  She had a recent echocardiogram which showed an improvement of her ejection fraction to 35-40% from 25-30%.  She had a Medtronic dual-chamber pacemaker implanted 11/20/17.  Today, denies symptoms of palpitations, chest pain, shortness of breath, orthopnea, PND, lower extremity edema, claudication, dizziness, presyncope, syncope, bleeding, or neurologic sequela. The patient is tolerating medications without difficulties.  Overall feeling well.  Her main complaint is of a static noise that she has had since the pacemaker was implanted.  This happens despite taking out her hearing aids.  It happens most days.   Past Medical History:  Diagnosis Date  . A-fib ([place])   . Arthritis    "a little bit qwhere" (02/28/2017)  . CHF (congestive heart failure) ([place])   . CKD (chronic kidney disease), stage III ([place])    [name] 02/28/2017  . Depression   . GERD (gastroesophageal reflux disease)   . High cholesterol   . Hypertension   . Melanoma of back ([place])   . Myocardial infarction [facility])    "one dr said I'd had 1" (02/28/2017)  . Stroke [facility]) ~ 2010; ~2012   "affected the right side but I fully recovered; just a light one" (02/28/2017)  . Type II diabetes mellitus ([place])    Past  Surgical History:  Procedure Laterality Date  . APPENDECTOMY    . FALSE ANEURYSM REPAIR Right 03/08/2017   Procedure: REPAIR RIGHT RADIAL FALSE ANEURYSM;  Surgeon: Rosetta Posner, MD;  Location: [place];  Service: Vascular;  Laterality: Right;  . KNEE SURGERY Left 1970s   "I have 1/3 of my knee left in there"  . LAPAROSCOPIC CHOLECYSTECTOMY    . MELANOMA EXCISION     "back"  . PACEMAKER IMPLANT N/A 11/20/2017   Procedure: PACEMAKER IMPLANT;  Surgeon: Constance Haw, MD;  Location: [place] CV LAB;  Service: Cardiovascular;  Laterality: N/A;  . RIGHT/LEFT HEART CATH AND CORONARY ANGIOGRAPHY N/A 03/06/2017   Procedure: Right/Left Heart Cath and Coronary Angiography;  Surgeon: Larey Dresser, MD;  Location: [place] CV LAB;  Service: Cardiovascular;  Laterality: N/A;  . TUBAL LIGATION       Current Outpatient Medications  Medication Sig Dispense Refill  . acetaminophen (TYLENOL) 500 MG tablet Take 1,000 mg by mouth every 6 (six) hours as needed for moderate pain or headache.    Marland Kitchen amiodarone (PACERONE) 200 MG tablet Take 1 tablet (200 mg total) by mouth daily. 30 tablet 3  . benzonatate (TESSALON) 100 MG capsule Take 100 mg by mouth 2 (two) times daily.    . clarithromycin (BIAXIN) 500 MG tablet Take 500 mg by mouth 2 (two) times daily.  0  . ergocalciferol (VITAMIN D2) 50000 units capsule Take 50,000 Units by mouth every Friday. At night    .  ferrous sulfate 325 (65 FE) MG tablet Take 1 tablet (325 mg total) by mouth 3 (three) times daily with meals. 90 tablet 0  . furosemide (LASIX) 40 MG tablet Take 1 tablet (40 mg total) by mouth daily. 30 tablet 3  . hydrALAZINE (APRESOLINE) 25 MG tablet Take 1 tablet (25 mg total) by mouth 3 (three) times daily. 90 tablet 3  . ipratropium (ATROVENT HFA) 17 MCG/ACT inhaler Inhale 2 puffs into the lungs every 6 (six) hours as needed for wheezing. 1 Inhaler 12  . isosorbide mononitrate (IMDUR) 30 MG 24 hr tablet Take 30 mg by mouth at bedtime.   2    . LANTUS SOLOSTAR 100 UNIT/ML Solostar Pen Inject 50 Units into the skin daily.   3  . metoprolol succinate (TOPROL-XL) 25 MG 24 hr tablet Take 3 tablets (75 mg total) by mouth daily. Take with or immediately following a meal. 90 tablet 11  . pravastatin (PRAVACHOL) 40 MG tablet Take 40 mg by mouth at bedtime.   2  . sacubitril-valsartan (ENTRESTO) 24-26 MG Take 1 tablet by mouth 2 (two) times daily. 60 tablet 3  . venlafaxine (EFFEXOR) 75 MG tablet Take 75 mg by mouth daily.     Shannon Lucas 15 MG TABS tablet Take 1 tablet (15 mg total) by mouth daily. (Patient taking differently: Take 15 mg by mouth daily with supper. ) 42 tablet 4   No current facility-administered medications for this visit.     Allergies:   Patient has no known allergies.   Social History:  The patient  reports that she has quit smoking. she has never used smokeless tobacco. She reports that she does not drink alcohol or use drugs.   Family History:  The patient's family history includes Diabetes in her mother; Parkinson's disease in her brother; Stroke in her father.    ROS:  Please see the history of present illness.   Otherwise, review of systems is positive for none.   All other systems are reviewed and negative.   PHYSICAL EXAM: VS:  BP 120/80   Pulse 80   Ht 5\' 5"  (1.651 m)   Wt 213 lb (96.6 kg)   BMI 35.45 kg/m  , BMI Body mass index is 35.45 kg/m. GEN: Well nourished, well developed, in no acute distress  HEENT: normal  Neck: no JVD, carotid bruits, or masses Cardiac: RRR; no murmurs, rubs, or gallops,no edema  Respiratory:  clear to auscultation bilaterally, normal work of breathing GI: soft, nontender, nondistended, + BS MS: no deformity or atrophy  Skin: warm and dry, device site well healed Neuro:  Strength and sensation are intact Psych: euthymic mood, full affect  EKG:  EKG is ordered today. Personal review of the ekg ordered shows atrial flutter  Personal review of the device interrogation  today. Results in [place]: 08/14/2017: B Natriuretic Peptide 620.4 11/16/2017: Hemoglobin 12.2; Platelets 226 12/08/2017: ALT 16; BUN 30; Creatinine, Ser 1.62; Potassium 4.0; Sodium 142; TSH 3.927    Lipid Panel     Component Value Date/Time   CHOL 147 07/14/2017 1109   TRIG 66 07/14/2017 1109   HDL 51 07/14/2017 1109   CHOLHDL 2.9 07/14/2017 1109   VLDL 13 07/14/2017 1109   LDLCALC 83 07/14/2017 1109     Wt Readings from Last 3 Encounters:  01/29/18 213 lb (96.6 kg)  12/08/17 210 lb 6.4 oz (95.4 kg)  11/21/17 205 lb 11 oz (93.3 kg)      Other  studies Reviewed: Additional studies/ records that were reviewed today include: TTE 08/14/17  Review of the above records today demonstrates:  - Left ventricle: LVEF is depressed at approximately 35 to 40% with   diffuse hypokinesis. The cavity size was mildly dilated. Wall   thickness was increased in a pattern of mild LVH. The study is   not technically sufficient to allow evaluation of LV diastolic   function. - Aortic valve: AV is thickened, calcified with very mildly   restricted motion. - Mitral valve: Calcified annulus. Mildly thickened leaflets .   There was mild regurgitation. - Left atrium: The atrium was mildly dilated. - Right ventricle: Systolic function was mildly reduced. - Right atrium: The atrium was mildly dilated. - Pulmonary arteries: PA peak pressure: 50 mm Hg (S). - Impressions: Frequent pauses during study.  Holter 09/06/17 - personally reviewed 1. Abnormal holter.  2. Atrial fibrillation at times with RVR noted.  3. I am concerned that there are runs of VT (not just afib with aberrancy).  4. Up to 2 second pauses.    ASSESSMENT AND PLAN:  1.  Chronic systolic heart failure due to nonischemic cardiomyopathy: Resto with improvement in her LV function.  No volume overload seen.  No changes.  2.  Paroxysmal atrial fibrillation: Early on amiodarone and Xarelto.  She has had a wide-complex  tachycardia that was possibly atrial fibrillation as well.  Device interrogation shows no arrhythmias.  No changes.    This patients CHA2DS2-VASc Score and unadjusted Ischemic Stroke Rate (% per year) is equal to 7.2 % stroke rate/year from a score of 5  Above score calculated as 1 point each if present [CHF, HTN, DM, Vascular=MI/PAD/Aortic Plaque, Age if 65-74, or Female] Above score calculated as 2 points each if present [Age > 75, or Stroke/TIA/TE]  3.  Tachybradycardia syndrome: Status post Medtronic dual-chamber pacemaker implanted on 11/20/17.  Device functioning appropriately.  No changes.  Of note, she is hearing static.  We Shannon Lucas have Medtronic call tech services to see if this is a common issue.  Current medicines are reviewed at length with the patient today.   The patient does not have concerns regarding her medicines.  The following changes were made today:  none  Labs/ tests ordered today include:  Orders Placed This Encounter  Procedures  . EKG 12-Lead     Disposition:   FU with Shannon Lucas 9 months  Signed, Minola Guin Meredith Leeds, MD  01/29/2018 3:24 PM     [address] [place] [address] [phone] (office) [phone] (fax)

## 2018-01-29 NOTE — Patient Instructions (Addendum)
Medication Instructions:  Your physician recommends that you continue on your current medications as directed. Please refer to the Current Medication list given to you today.  *If you need a refill on your cardiac medications before your next appointment, please call your pharmacy*  Labwork: None ordered  Testing/Procedures: None ordered  Follow-Up: Remote monitoring is used to monitor your Pacemaker or ICD from home. This monitoring reduces the number of office visits required to check your device to one time per year. It allows [place] to keep an eye on the functioning of your device to ensure it is working properly. You are scheduled for a device check from home on 04/30/2018. You may send your transmission at any time that day. If you have a wireless device, the transmission will be sent automatically. After your physician reviews your transmission, you will receive a postcard with your next transmission date.  Your physician wants you to follow-up in: 9 months with Dr. Curt Bears.  You will receive a reminder letter in the mail two months in advance. If you don't receive a letter, please call our office to schedule the follow-up appointment.  Thank you for choosing [facility]!!   Trinidad Curet, RN [phone]

## 2018-03-12 ENCOUNTER — Encounter (HOSPITAL_COMMUNITY): Payer: Medicare Other | Admitting: Cardiology

## 2018-03-16 ENCOUNTER — Other Ambulatory Visit (HOSPITAL_COMMUNITY): Payer: Self-pay | Admitting: Student

## 2018-04-30 ENCOUNTER — Ambulatory Visit (INDEPENDENT_AMBULATORY_CARE_PROVIDER_SITE_OTHER): Payer: Medicare Other | Admitting: *Deleted

## 2018-04-30 DIAGNOSIS — I495 Sick sinus syndrome: Secondary | ICD-10-CM | POA: Diagnosis not present

## 2018-04-30 DIAGNOSIS — I428 Other cardiomyopathies: Secondary | ICD-10-CM

## 2018-04-30 NOTE — Progress Notes (Signed)
Remote pacemaker transmission.   

## 2018-05-04 LAB — CUP PACEART REMOTE DEVICE CHECK
Battery Remaining Longevity: 90 mo
Battery Voltage: 3.03 V
Brady Statistic AP VS Percent: 38.69 %
Brady Statistic RA Percent Paced: 83.97 %
Brady Statistic RV Percent Paced: 55.66 %
Date Time Interrogation Session: 20190617050114
Implantable Lead Implant Date: 20190107
Implantable Lead Implant Date: 20190107
Implantable Lead Model: 3830
Implantable Lead Model: 5076
Implantable Pulse Generator Implant Date: 20190107
Lead Channel Impedance Value: 285 Ohm
Lead Channel Impedance Value: 361 Ohm
Lead Channel Impedance Value: 475 Ohm
Lead Channel Pacing Threshold Amplitude: 1.25 V
Lead Channel Sensing Intrinsic Amplitude: 0.5 mV
Lead Channel Sensing Intrinsic Amplitude: 0.5 mV
Lead Channel Sensing Intrinsic Amplitude: 1.125 mV
Lead Channel Setting Pacing Amplitude: 2.5 V
Lead Channel Setting Pacing Amplitude: 3 V
MDC IDC LEAD LOCATION: 753859
MDC IDC LEAD LOCATION: 753860
MDC IDC MSMT LEADCHNL RA PACING THRESHOLD PULSEWIDTH: 0.4 ms
MDC IDC MSMT LEADCHNL RV IMPEDANCE VALUE: 361 Ohm
MDC IDC MSMT LEADCHNL RV SENSING INTR AMPL: 1.125 mV
MDC IDC SET LEADCHNL RV PACING PULSEWIDTH: 1 ms
MDC IDC SET LEADCHNL RV SENSING SENSITIVITY: 0.9 mV
MDC IDC STAT BRADY AP VP PERCENT: 60.52 %
MDC IDC STAT BRADY AS VP PERCENT: 0.27 %
MDC IDC STAT BRADY AS VS PERCENT: 0.56 %

## 2018-05-28 ENCOUNTER — Ambulatory Visit (HOSPITAL_COMMUNITY)
Admission: RE | Admit: 2018-05-28 | Discharge: 2018-05-28 | Disposition: A | Discharge status: Home / Self Care | Payer: Medicare Other | Source: Ambulatory Visit | Attending: Cardiology | Admitting: Cardiology

## 2018-05-28 ENCOUNTER — Encounter (HOSPITAL_COMMUNITY): Payer: Self-pay | Admitting: Cardiology

## 2018-05-28 VITALS — BP 148/72 | HR 87 | Wt 212.1 lb

## 2018-05-28 DIAGNOSIS — I130 Hypertensive heart and chronic kidney disease with heart failure and stage 1 through stage 4 chronic kidney disease, or unspecified chronic kidney disease: Secondary | ICD-10-CM | POA: Insufficient documentation

## 2018-05-28 DIAGNOSIS — Z7901 Long term (current) use of anticoagulants: Secondary | ICD-10-CM | POA: Insufficient documentation

## 2018-05-28 DIAGNOSIS — I5022 Chronic systolic (congestive) heart failure: Secondary | ICD-10-CM | POA: Diagnosis not present

## 2018-05-28 DIAGNOSIS — I480 Paroxysmal atrial fibrillation: Secondary | ICD-10-CM | POA: Diagnosis not present

## 2018-05-28 DIAGNOSIS — Z79899 Other long term (current) drug therapy: Secondary | ICD-10-CM | POA: Insufficient documentation

## 2018-05-28 DIAGNOSIS — Z8673 Personal history of transient ischemic attack (TIA), and cerebral infarction without residual deficits: Secondary | ICD-10-CM | POA: Diagnosis not present

## 2018-05-28 DIAGNOSIS — M545 Low back pain: Secondary | ICD-10-CM | POA: Diagnosis not present

## 2018-05-28 DIAGNOSIS — Z794 Long term (current) use of insulin: Secondary | ICD-10-CM | POA: Diagnosis not present

## 2018-05-28 DIAGNOSIS — R001 Bradycardia, unspecified: Secondary | ICD-10-CM | POA: Insufficient documentation

## 2018-05-28 DIAGNOSIS — I350 Nonrheumatic aortic (valve) stenosis: Secondary | ICD-10-CM | POA: Insufficient documentation

## 2018-05-28 DIAGNOSIS — D649 Anemia, unspecified: Secondary | ICD-10-CM | POA: Insufficient documentation

## 2018-05-28 DIAGNOSIS — E785 Hyperlipidemia, unspecified: Secondary | ICD-10-CM | POA: Diagnosis not present

## 2018-05-28 DIAGNOSIS — N183 Chronic kidney disease, stage 3 (moderate): Secondary | ICD-10-CM | POA: Diagnosis not present

## 2018-05-28 DIAGNOSIS — I484 Atypical atrial flutter: Secondary | ICD-10-CM | POA: Diagnosis not present

## 2018-05-28 DIAGNOSIS — I2510 Atherosclerotic heart disease of native coronary artery without angina pectoris: Secondary | ICD-10-CM | POA: Diagnosis not present

## 2018-05-28 DIAGNOSIS — K219 Gastro-esophageal reflux disease without esophagitis: Secondary | ICD-10-CM | POA: Insufficient documentation

## 2018-05-28 DIAGNOSIS — I429 Cardiomyopathy, unspecified: Secondary | ICD-10-CM | POA: Diagnosis not present

## 2018-05-28 DIAGNOSIS — E1122 Type 2 diabetes mellitus with diabetic chronic kidney disease: Secondary | ICD-10-CM | POA: Insufficient documentation

## 2018-05-28 LAB — LIPID PANEL
CHOL/HDL RATIO: 3.3 ratio
Cholesterol: 184 mg/dL (ref 0–200)
HDL: 55 mg/dL (ref >40)
LDL Cholesterol: 105 mg/dL — ABNORMAL HIGH (ref 0–99)
Triglycerides: 122 mg/dL (ref <150)
VLDL: 24 mg/dL (ref 0–40)

## 2018-05-28 LAB — COMPREHENSIVE METABOLIC PANEL
ALBUMIN: 3.5 g/dL (ref 3.5–5.0)
ALT: 16 U/L (ref 0–44)
AST: 20 U/L (ref 15–41)
Alkaline Phosphatase: 97 U/L (ref 38–126)
Anion gap: 9 (ref 5–15)
BILIRUBIN TOTAL: 0.8 mg/dL (ref 0.3–1.2)
BUN: 38 mg/dL — ABNORMAL HIGH (ref 8–23)
CO2: 24 mmol/L (ref 22–32)
Calcium: 9.1 mg/dL (ref 8.9–10.3)
Chloride: 108 mmol/L (ref 98–111)
Creatinine, Ser: 1.91 mg/dL — ABNORMAL HIGH (ref 0.44–1.00)
GFR calc Af Amer: 26 mL/min — ABNORMAL LOW (ref >60)
GFR calc non Af Amer: 23 mL/min — ABNORMAL LOW (ref >60)
GLUCOSE: 180 mg/dL — AB (ref 70–99)
POTASSIUM: 4 mmol/L (ref 3.5–5.1)
SODIUM: 141 mmol/L (ref 135–145)
TOTAL PROTEIN: 6.3 g/dL — AB (ref 6.5–8.1)

## 2018-05-28 LAB — TSH: TSH: 3.202 u[IU]/mL (ref 0.350–4.500)

## 2018-05-28 MED ORDER — SACUBITRIL-VALSARTAN 49-51 MG PO TABS: 1.0000 | ORAL_TABLET | Freq: Two times a day (BID) | ORAL | 3 refills | Status: DC

## 2018-05-28 NOTE — Patient Instructions (Addendum)
Stop Hydralazine   Stop Imdur   Increase Amiodarone 200 mg (1 tab) twice a day for 5 days - then 200 mg (1 tab) daily   Increase Entresto 49/51 (1 tab), twice a day  Labs drawn today (if we do not call you, then your lab work was stable)   Your physician recommends that you return for lab work in: 10 days   Your physician recommends that you schedule a follow-up appointment in: 2 months with Dr. Aundra Dubin

## 2018-05-28 NOTE — Progress Notes (Signed)
Advanced Heart Failure Clinic Note   Primary Care: [facility], [place] HF Cardiology: Dr. Aundra Dubin  HPI:  Shannon Lucas is a 84 y.o. female with PMH of Systolic CHF,  PAF on Xarelto, CKD stage III, DM II, GERD, HLD, CVA, anemia.   Admitted 4/17 -> 1/61/09 with A/C systolic CHF. L/RHC as below with normal coronaries and elevated filling pressures. Diuresed 5 lbs. Hospital course complicated by hematoma at radial cath site that improved overnight. Medications adjusted as tolerated. Discharge weight 205 lbs.    Pt seen in ED 03/08/17 for worsening of hematoma as above.  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 Dr. Donnetta Hutching saw in consult and repaired under local anaesthesia and sedation.   Echo in 10/18 showed EF 35-40% with diffuse hypokinesis, PASP 50 mmHg.  Holter in 10/18 showed runs of atrial fibrillation including afib with aberrancy.  Bradycardia into the 30s was also noted.  Decision was made to place PPM => patient got Medtronic device with His bundle lead and is now His pacing.    She presents today for followup of CHF.  No palpitations.  She does report ongoing fatigue with activity.  She is limited by low back pain.  She uses a walker because of her back.  Fatigue has seemed worse for the last day or so.  No dyspnea walking on flat ground though not very active.  Balance is poor, she has had falls. Weight is up 2 lbs.     ECG (personally reviewed): atypical atrial flutter at 86 bpm  Medtronic device interrogation: She has been in atypical atrial flutter for about a day.  Labs (04/06/17): K 5.0, Creatinine 2.04, BUN 43, LFTs normal Labs (6/18): K 3.9, creatinine 1.68 Labs (8/18): LDL 83, HDL 51, hgb 11.1, K 4.5, creatinine 1.48, LFTs normal, TSH normal.  Labs (1/19): K 4, creatinine 1.5, hgb 12.2, TSH normal, LFTs normal  Review of systems complete and found to be negative unless listed in HPI.    PMH 1. Chronic systolic CHF: Nonischemic cardiomyopathy.  - Echo 03/01/17 LVEF 25-30%, At least  mild AS, Mild MR, Mod LAE, Mod RAE, Trivial PI, PA peak pressure 42 mm Hg. - LHC (4/18) with nonobstructive CAD.  - Echo (10/18): EF 35-40%, diffuse hypokinesis, mild LVH, mild MR, mild AS, PASP 50 mmHg.  - Medtronic PPM with His bundle lead placed.  2. Atrial fibrillation: Paroxysmal. On Xarelto and amiodarone.  - Atypical atrial flutter noted 05/28/18.  3. CKD Stage III 4. DMII 5. GERD 6. HLD 7. Hx of CVA - ?2011-2012 with no lasting deficit 8. Chronic anemia 9. CAD: LHC (4/18) with 70% ostial D1, 50% ostial D2, 40% ostial OM1.  10. Aortic stenosis: Mild on echo in 10/18.  11. 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   Current Outpatient Medications  Medication Sig Dispense Refill  . acetaminophen (TYLENOL) 500 MG tablet Take 1,000 mg by mouth every 6 (six) hours as needed for moderate pain or headache.    Marland Kitchen amiodarone (PACERONE) 200 MG tablet Take 1 tablet (200 mg total) by mouth daily. 30 tablet 3  . ergocalciferol (VITAMIN D2) 50000 units capsule Take 50,000 Units by mouth every Friday. At night    . ferrous sulfate 325 (65 FE) MG tablet Take 1 tablet (325 mg total) by mouth 3 (three) times daily with meals. 90 tablet 0  . furosemide (LASIX) 40 MG tablet Take 1 tablet (40 mg total) by mouth daily. 30 tablet 3  . ipratropium (ATROVENT HFA) 17  MCG/ACT inhaler Inhale 2 puffs into the lungs every 6 (six) hours as needed for wheezing. 1 Inhaler 12  . LANTUS SOLOSTAR 100 UNIT/ML Solostar Pen Inject 50 Units into the skin daily.   3  . metoprolol succinate (TOPROL-XL) 25 MG 24 hr tablet Take 3 tablets (75 mg total) by mouth daily. Take with or immediately following a meal. 90 tablet 11  . pravastatin (PRAVACHOL) 40 MG tablet Take 40 mg by mouth at bedtime.   2  . venlafaxine (EFFEXOR) 75 MG tablet Take 75 mg by mouth daily.     [name] 15 MG TABS tablet Take 1 tablet (15 mg total) by mouth daily with supper. 30 tablet 6  . sacubitril-valsartan (ENTRESTO) 49-51 MG Take 1 tablet by  mouth 2 (two) times daily. 60 tablet 3   No current facility-administered medications for this encounter.    No Known Allergies   Social History   Socioeconomic History  . Marital status: Widowed    Spouse name: Not on file  . Number of children: Not on file  . Years of education: Not on file  . Highest education level: Not on file  Occupational History  . Not on file  Social Needs  . Financial resource strain: Not on file  . Food insecurity:    Worry: Not on file    Inability: Not on file  . Transportation needs:    Medical: Not on file    Non-medical: Not on file  Tobacco Use  . Smoking status: Former Research scientist (life sciences)  . Smokeless tobacco: Never Used  . Tobacco comment: 02/28/2017 "only smoked in the 1960s when we went out"  Substance and Sexual Activity  . Alcohol use: No  . Drug use: No  . Sexual activity: Never  Lifestyle  . Physical activity:    Days per week: Not on file    Minutes per session: Not on file  . Stress: Not on file  Relationships  . Social connections:    Talks on phone: Not on file    Gets together: Not on file    Attends religious service: Not on file    Active member of club or organization: Not on file    Attends meetings of clubs or organizations: Not on file    Relationship status: Not on file  . Intimate partner violence:    Fear of current or ex partner: Not on file    Emotionally abused: Not on file    Physically abused: Not on file    Forced sexual activity: Not on file  Other Topics Concern  . Not on file  Social History Narrative  . Not on file   Family history No family history of premature CAD or CHF.   Vitals:   05/28/18 1354  BP: (!) 148/72  Pulse: 87  SpO2: 97%  Weight: 212 lb 1.9 oz (96.2 kg)   Wt Readings from Last 3 Encounters:  05/28/18 212 lb 1.9 oz (96.2 kg)  01/29/18 213 lb (96.6 kg)  12/08/17 210 lb 6.4 oz (95.4 kg)     PHYSICAL EXAM: General: NAD Neck: No JVD, no thyromegaly or thyroid nodule.  Lungs: Clear  to auscultation bilaterally with normal respiratory effort. CV: Nondisplaced PMI.  Heart irregular S1/S2, no S3/S4, 1/6 HSM LLSB.  No peripheral edema.  No carotid bruit.  Normal pedal pulses.  Abdomen: Soft, nontender, no hepatosplenomegaly, no distention.  Skin: Intact without lesions or rashes.  Neurologic: Alert and oriented x 3.  Psych: Normal  affect. Extremities: No clubbing or cyanosis.  HEENT: Normal.   ASSESSMENT & PLAN:  1. Chronic systolic HF:  Echo with EF 25-30% 02/2017, repeat echo 10/18 showed EF 35-40%. Nonischemic cardiomyopathy. Medtronic device with His bundle lead.  NYHA II-III currently. She is not volume overloaded and does not get short of breath on flat ground.  She fatigues easily.  She is in atypical atrial flutter today, which may contribute to symptoms. . - Continue Lasix 40 mg daily.   - Stop hydralazine/Imdur and increase Entresto to 49/51 bid.  BMET today and in 10 days.  - Continue Toprol XL to 75 mg daily.    - Add spironolactone next appt.  2. CKD: Stage III. BMET today.  3. PAF/atrial flutter: Atypical atrial flutter for about a day with controlled rate.  This may be worsening her fatigue.  - Increase amiodarone to 200 mg bid x 5 days then back to 200 mg daily.  Check LFTs/TSH.  She will need regular eye exams.   - Continue Xarelto.  - Repeat ECG on Wednesday.  If she is still in flutter, I will arrange for cardioversion. We discussed risks/benefits of procedure an she is willing to have it done.  4. CAD: Nonobstructive on 4/18 cath.   - Continue pravastatin, check lipids today.    - No ASA given stable CAD and Xarelto use.  5. Bradycardia: Now s/p Medtronic PPM with His bundle pacing.  6. HTN: Increasing Entresto as above.   Followup in 2 months.  Loralie Champagne, MD 05/28/18

## 2018-05-29 ENCOUNTER — Telehealth (HOSPITAL_COMMUNITY): Payer: Self-pay

## 2018-05-29 MED ORDER — FUROSEMIDE 40 MG PO TABS: 20.0000 mg | ORAL_TABLET | Freq: Every day | ORAL | 3 refills | Status: DC

## 2018-05-29 NOTE — Telephone Encounter (Signed)
Notes recorded by Shirley Muscat, RN on 05/29/2018 at 3:03 PM EDT Pt aware of results, agreeable to med changes (changes made in [facility]) lab appt made ------  Notes recorded by Larey Dresser, MD on 05/28/2018 at 8:08 PM EDT Please have her decrease Lasix to 20 mg daily since we increased Entresto. Will need repeat BMET in 1 week with creatinine 1.9 today.

## 2018-05-30 ENCOUNTER — Ambulatory Visit (HOSPITAL_COMMUNITY)
Admission: RE | Admit: 2018-05-30 | Discharge: 2018-05-30 | Disposition: A | Discharge status: Home / Self Care | Payer: Medicare Other | Source: Ambulatory Visit | Attending: Cardiology | Admitting: Cardiology

## 2018-05-30 VITALS — Wt 214.5 lb

## 2018-05-30 DIAGNOSIS — I5022 Chronic systolic (congestive) heart failure: Secondary | ICD-10-CM | POA: Insufficient documentation

## 2018-05-30 DIAGNOSIS — Z87891 Personal history of nicotine dependence: Secondary | ICD-10-CM | POA: Insufficient documentation

## 2018-05-30 DIAGNOSIS — N183 Chronic kidney disease, stage 3 (moderate): Secondary | ICD-10-CM | POA: Insufficient documentation

## 2018-05-30 DIAGNOSIS — I130 Hypertensive heart and chronic kidney disease with heart failure and stage 1 through stage 4 chronic kidney disease, or unspecified chronic kidney disease: Secondary | ICD-10-CM | POA: Diagnosis present

## 2018-05-30 DIAGNOSIS — Z79899 Other long term (current) drug therapy: Secondary | ICD-10-CM | POA: Diagnosis not present

## 2018-05-30 DIAGNOSIS — Z794 Long term (current) use of insulin: Secondary | ICD-10-CM | POA: Diagnosis not present

## 2018-05-30 DIAGNOSIS — I480 Paroxysmal atrial fibrillation: Secondary | ICD-10-CM

## 2018-05-30 DIAGNOSIS — E1122 Type 2 diabetes mellitus with diabetic chronic kidney disease: Secondary | ICD-10-CM | POA: Insufficient documentation

## 2018-05-30 DIAGNOSIS — I2510 Atherosclerotic heart disease of native coronary artery without angina pectoris: Secondary | ICD-10-CM | POA: Insufficient documentation

## 2018-05-30 DIAGNOSIS — E785 Hyperlipidemia, unspecified: Secondary | ICD-10-CM | POA: Insufficient documentation

## 2018-05-30 NOTE — Progress Notes (Signed)
Patient seen today for EKG/nurse visit. During her last office visit on 05/28/18 Dr.McLean ordered "Repeat ECG on Wednesday.  If she is still in flutter, I will arrange for cardioversion. We discussed risks/benefits of procedure and she is willing to have it done". EKG today showed that she is atrial paced. Per Dr.McLean she does need DCCV she should continue current regimen. Pt aware and agreeable with plan.

## 2018-06-08 ENCOUNTER — Ambulatory Visit (HOSPITAL_COMMUNITY)
Admission: RE | Admit: 2018-06-08 | Discharge: 2018-06-08 | Disposition: A | Discharge status: Home / Self Care | Payer: Medicare Other | Source: Ambulatory Visit | Attending: Cardiology | Admitting: Cardiology

## 2018-06-08 DIAGNOSIS — I5022 Chronic systolic (congestive) heart failure: Secondary | ICD-10-CM

## 2018-06-08 LAB — BASIC METABOLIC PANEL
ANION GAP: 8 (ref 5–15)
BUN: 31 mg/dL — ABNORMAL HIGH (ref 8–23)
CALCIUM: 9.3 mg/dL (ref 8.9–10.3)
CO2: 26 mmol/L (ref 22–32)
CREATININE: 1.78 mg/dL — AB (ref 0.44–1.00)
Chloride: 106 mmol/L (ref 98–111)
GFR calc Af Amer: 29 mL/min — ABNORMAL LOW (ref >60)
GFR, EST NON AFRICAN AMERICAN: 25 mL/min — AB (ref >60)
GLUCOSE: 137 mg/dL — AB (ref 70–99)
Potassium: 4.3 mmol/L (ref 3.5–5.1)
Sodium: 140 mmol/L (ref 135–145)

## 2018-06-25 DIAGNOSIS — Z950 Presence of cardiac pacemaker: Secondary | ICD-10-CM | POA: Insufficient documentation

## 2018-07-30 ENCOUNTER — Ambulatory Visit (INDEPENDENT_AMBULATORY_CARE_PROVIDER_SITE_OTHER): Payer: Medicare Other | Admitting: *Deleted

## 2018-07-30 ENCOUNTER — Encounter (HOSPITAL_COMMUNITY): Payer: Medicare Other | Admitting: Cardiology

## 2018-07-30 DIAGNOSIS — I495 Sick sinus syndrome: Secondary | ICD-10-CM | POA: Diagnosis not present

## 2018-07-30 NOTE — Progress Notes (Signed)
Remote pacemaker transmission.   

## 2018-08-22 LAB — CUP PACEART REMOTE DEVICE CHECK
Brady Statistic AP VS Percent: 94.68 %
Brady Statistic AS VP Percent: 0.01 %
Brady Statistic AS VS Percent: 0.23 %
Brady Statistic RV Percent Paced: 5.41 %
Date Time Interrogation Session: 20190916044628
Implantable Lead Implant Date: 20190107
Implantable Lead Model: 3830
Implantable Lead Model: 5076
Lead Channel Impedance Value: 266 Ohm
Lead Channel Impedance Value: 342 Ohm
Lead Channel Impedance Value: 361 Ohm
Lead Channel Impedance Value: 456 Ohm
Lead Channel Sensing Intrinsic Amplitude: 1 mV
Lead Channel Sensing Intrinsic Amplitude: 1.25 mV
Lead Channel Setting Pacing Amplitude: 2.5 V
Lead Channel Setting Pacing Pulse Width: 1 ms
MDC IDC LEAD IMPLANT DT: 20190107
MDC IDC LEAD LOCATION: 753859
MDC IDC LEAD LOCATION: 753860
MDC IDC MSMT BATTERY REMAINING LONGEVITY: 96 mo
MDC IDC MSMT BATTERY VOLTAGE: 3.02 V
MDC IDC MSMT LEADCHNL RA PACING THRESHOLD AMPLITUDE: 1.25 V
MDC IDC MSMT LEADCHNL RA PACING THRESHOLD PULSEWIDTH: 0.4 ms
MDC IDC MSMT LEADCHNL RA SENSING INTR AMPL: 1 mV
MDC IDC MSMT LEADCHNL RV SENSING INTR AMPL: 1.25 mV
MDC IDC PG IMPLANT DT: 20190107
MDC IDC SET LEADCHNL RV PACING AMPLITUDE: 3 V
MDC IDC SET LEADCHNL RV SENSING SENSITIVITY: 0.9 mV
MDC IDC STAT BRADY AP VP PERCENT: 5.12 %
MDC IDC STAT BRADY RA PERCENT PACED: 99.09 %

## 2018-08-23 ENCOUNTER — Other Ambulatory Visit: Payer: Self-pay | Admitting: Internal Medicine

## 2018-08-29 ENCOUNTER — Other Ambulatory Visit (HOSPITAL_COMMUNITY): Payer: Self-pay | Admitting: Internal Medicine

## 2018-09-17 ENCOUNTER — Ambulatory Visit (HOSPITAL_COMMUNITY)
Admission: RE | Admit: 2018-09-17 | Discharge: 2018-09-17 | Disposition: A | Discharge status: Home / Self Care | Payer: Medicare Other | Source: Ambulatory Visit | Attending: Cardiology | Admitting: Cardiology

## 2018-09-17 VITALS — BP 156/67 | HR 98 | Wt 222.8 lb

## 2018-09-17 DIAGNOSIS — R001 Bradycardia, unspecified: Secondary | ICD-10-CM | POA: Insufficient documentation

## 2018-09-17 DIAGNOSIS — K219 Gastro-esophageal reflux disease without esophagitis: Secondary | ICD-10-CM | POA: Diagnosis not present

## 2018-09-17 DIAGNOSIS — I5022 Chronic systolic (congestive) heart failure: Secondary | ICD-10-CM | POA: Diagnosis not present

## 2018-09-17 DIAGNOSIS — I2510 Atherosclerotic heart disease of native coronary artery without angina pectoris: Secondary | ICD-10-CM | POA: Insufficient documentation

## 2018-09-17 DIAGNOSIS — I350 Nonrheumatic aortic (valve) stenosis: Secondary | ICD-10-CM | POA: Insufficient documentation

## 2018-09-17 DIAGNOSIS — I480 Paroxysmal atrial fibrillation: Secondary | ICD-10-CM | POA: Diagnosis not present

## 2018-09-17 DIAGNOSIS — I428 Other cardiomyopathies: Secondary | ICD-10-CM | POA: Diagnosis not present

## 2018-09-17 DIAGNOSIS — Z794 Long term (current) use of insulin: Secondary | ICD-10-CM | POA: Diagnosis not present

## 2018-09-17 DIAGNOSIS — R9431 Abnormal electrocardiogram [ECG] [EKG]: Secondary | ICD-10-CM | POA: Diagnosis not present

## 2018-09-17 DIAGNOSIS — Z87891 Personal history of nicotine dependence: Secondary | ICD-10-CM | POA: Diagnosis not present

## 2018-09-17 DIAGNOSIS — Z7901 Long term (current) use of anticoagulants: Secondary | ICD-10-CM | POA: Insufficient documentation

## 2018-09-17 DIAGNOSIS — Z79899 Other long term (current) drug therapy: Secondary | ICD-10-CM | POA: Insufficient documentation

## 2018-09-17 DIAGNOSIS — D649 Anemia, unspecified: Secondary | ICD-10-CM | POA: Diagnosis not present

## 2018-09-17 DIAGNOSIS — N183 Chronic kidney disease, stage 3 (moderate): Secondary | ICD-10-CM | POA: Insufficient documentation

## 2018-09-17 DIAGNOSIS — Z950 Presence of cardiac pacemaker: Secondary | ICD-10-CM | POA: Insufficient documentation

## 2018-09-17 DIAGNOSIS — Z8673 Personal history of transient ischemic attack (TIA), and cerebral infarction without residual deficits: Secondary | ICD-10-CM | POA: Insufficient documentation

## 2018-09-17 DIAGNOSIS — I130 Hypertensive heart and chronic kidney disease with heart failure and stage 1 through stage 4 chronic kidney disease, or unspecified chronic kidney disease: Secondary | ICD-10-CM | POA: Insufficient documentation

## 2018-09-17 DIAGNOSIS — E785 Hyperlipidemia, unspecified: Secondary | ICD-10-CM | POA: Insufficient documentation

## 2018-09-17 DIAGNOSIS — E1122 Type 2 diabetes mellitus with diabetic chronic kidney disease: Secondary | ICD-10-CM | POA: Diagnosis not present

## 2018-09-17 LAB — CBC
HCT: 39.8 % (ref 36.0–46.0)
Hemoglobin: 12.6 g/dL (ref 12.0–15.0)
MCH: 31 pg (ref 26.0–34.0)
MCHC: 31.7 g/dL (ref 30.0–36.0)
MCV: 97.8 fL (ref 80.0–100.0)
PLATELETS: 200 10*3/uL (ref 150–400)
RBC: 4.07 MIL/uL (ref 3.87–5.11)
RDW: 13.2 % (ref 11.5–15.5)
WBC: 6.9 10*3/uL (ref 4.0–10.5)
nRBC: 0 % (ref 0.0–0.2)

## 2018-09-17 LAB — COMPREHENSIVE METABOLIC PANEL
ALT: 15 U/L (ref 0–44)
AST: 18 U/L (ref 15–41)
Albumin: 3.5 g/dL (ref 3.5–5.0)
Alkaline Phosphatase: 97 U/L (ref 38–126)
Anion gap: 7 (ref 5–15)
BUN: 33 mg/dL — ABNORMAL HIGH (ref 8–23)
CALCIUM: 9.4 mg/dL (ref 8.9–10.3)
CO2: 25 mmol/L (ref 22–32)
CREATININE: 1.7 mg/dL — AB (ref 0.44–1.00)
Chloride: 106 mmol/L (ref 98–111)
GFR calc non Af Amer: 26 mL/min — ABNORMAL LOW (ref >60)
GFR, EST AFRICAN AMERICAN: 30 mL/min — AB (ref >60)
Glucose, Bld: 150 mg/dL — ABNORMAL HIGH (ref 70–99)
Potassium: 4 mmol/L (ref 3.5–5.1)
Sodium: 138 mmol/L (ref 135–145)
TOTAL PROTEIN: 6.2 g/dL — AB (ref 6.5–8.1)
Total Bilirubin: 0.4 mg/dL (ref 0.3–1.2)

## 2018-09-17 LAB — TSH: TSH: 4 u[IU]/mL (ref 0.350–4.500)

## 2018-09-17 MED ORDER — SACUBITRIL-VALSARTAN 49-51 MG PO TABS: 1.0000 | ORAL_TABLET | Freq: Two times a day (BID) | ORAL | 3 refills | Status: DC

## 2018-09-17 MED ORDER — METOPROLOL SUCCINATE ER 100 MG PO TB24: 100.0000 mg | ORAL_TABLET | Freq: Every day | ORAL | 3 refills | Status: DC

## 2018-09-17 MED ORDER — SPIRONOLACTONE 25 MG PO TABS: 12.5000 mg | ORAL_TABLET | Freq: Every day | ORAL | 3 refills | Status: DC

## 2018-09-17 NOTE — Progress Notes (Signed)
Advanced Heart Failure Clinic Note   Primary Care: [facility], [place] HF Cardiology: Dr. Aundra Dubin  HPI:  Shannon Lucas is a 84 y.o. female with PMH of Systolic CHF,  PAF on Xarelto, CKD stage III, DM II, GERD, HLD, CVA, anemia.   Admitted 4/17 -> 6/71/24 with A/C systolic CHF. L/RHC as below with normal coronaries and elevated filling pressures. Diuresed 5 lbs. Hospital course complicated by hematoma at radial cath site that improved overnight. Medications adjusted as tolerated. Discharge weight 205 lbs.    Pt seen in ED 03/08/17 for worsening of hematoma as above.  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 Dr. Donnetta Hutching saw in consult and repaired under local anaesthesia and sedation.   Echo in 10/18 showed EF 35-40% with diffuse hypokinesis, PASP 50 mmHg.  Holter in 10/18 showed runs of atrial fibrillation including afib with aberrancy.  Bradycardia into the 30s was also noted.  Decision was made to place PPM => patient got Medtronic device with His bundle lead and is now His pacing.    She presents today for followup of CHF.  She is feeling well overall.  She is not in atrial fibrillation today (a-paced).  She uses a walker.  No dyspnea walking on flat ground normally.  No chest pain.  She gets fatigued walking around [place].  No orthopnea/PND.  No palpitations.  She is, of note, taking both losartan and Entresto. Weight is up but she feels like it is caloric.    ECG (personally reviewed): a-paced, poor RWP.  Labs (04/06/17): K 5.0, Creatinine 2.04, BUN 43, LFTs normal Labs (6/18): K 3.9, creatinine 1.68 Labs (8/18): LDL 83, HDL 51, hgb 11.1, K 4.5, creatinine 1.48, LFTs normal, TSH normal.  Labs (1/19): K 4, creatinine 1.5, hgb 12.2, TSH normal, LFTs normal Labs (7/19): K 4.3, creatinine 1.78, LDL 105, TSH normal, LFTs normal  Review of systems complete and found to be negative unless listed in HPI.    PMH 1. Chronic systolic CHF: Nonischemic cardiomyopathy.  - Echo 03/01/17 LVEF 25-30%, At least  mild AS, Mild MR, Mod LAE, Mod RAE, Trivial PI, PA peak pressure 42 mm Hg. - LHC (4/18) with nonobstructive CAD.  - Echo (10/18): EF 35-40%, diffuse hypokinesis, mild LVH, mild MR, mild AS, PASP 50 mmHg.  - Medtronic PPM with His bundle lead placed.  2. Atrial fibrillation: Paroxysmal. On Xarelto and amiodarone.  - Atypical atrial flutter noted 05/28/18.  3. CKD Stage III 4. DMII 5. GERD 6. HLD 7. Hx of CVA - ?2011-2012 with no lasting deficit 8. Chronic anemia 9. CAD: LHC (4/18) with 70% ostial D1, 50% ostial D2, 40% ostial OM1.  10. Aortic stenosis: Mild on echo in 10/18.  11. 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   Current Outpatient Medications  Medication Sig Dispense Refill  . acetaminophen (TYLENOL) 500 MG tablet Take 1,000 mg by mouth every 6 (six) hours as needed for moderate pain or headache.    Marland Kitchen amiodarone (PACERONE) 200 MG tablet TAKE 1 TABLET BY MOUTH DAILY 30 tablet 3  . ergocalciferol (VITAMIN D2) 50000 units capsule Take 50,000 Units by mouth every Friday. At night    . ferrous sulfate 325 (65 FE) MG tablet Take 1 tablet (325 mg total) by mouth 3 (three) times daily with meals. 90 tablet 0  . furosemide (LASIX) 40 MG tablet Take 0.5 tablets (20 mg total) by mouth daily. 15 tablet 3  . ipratropium (ATROVENT HFA) 17 MCG/ACT inhaler Inhale 2 puffs into the lungs  every 6 (six) hours as needed for wheezing. 1 Inhaler 12  . isosorbide mononitrate (IMDUR) 30 MG 24 hr tablet TAKE 1 TABLET BY MOUTH ONCE DAILY    . LANTUS SOLOSTAR 100 UNIT/ML Solostar Pen Inject 50 Units into the skin daily.   3  . pravastatin (PRAVACHOL) 40 MG tablet Take 40 mg by mouth at bedtime.   2  . venlafaxine (EFFEXOR) 75 MG tablet Take 75 mg by mouth daily.     [name] 15 MG TABS tablet Take 1 tablet (15 mg total) by mouth daily with supper. 30 tablet 6  . metoprolol succinate (TOPROL-XL) 100 MG 24 hr tablet Take 1 tablet (100 mg total) by mouth daily. Take with or immediately following a  meal. 30 tablet 3  . sacubitril-valsartan (ENTRESTO) 49-51 MG Take 1 tablet by mouth 2 (two) times daily. 60 tablet 3  . spironolactone (ALDACTONE) 25 MG tablet Take 0.5 tablets (12.5 mg total) by mouth daily. 15 tablet 3   No current facility-administered medications for this encounter.    No Known Allergies   Social History   Socioeconomic History  . Marital status: Widowed    Spouse name: Not on file  . Number of children: Not on file  . Years of education: Not on file  . Highest education level: Not on file  Occupational History  . Not on file  Social Needs  . Financial resource strain: Not on file  . Food insecurity:    Worry: Not on file    Inability: Not on file  . Transportation needs:    Medical: Not on file    Non-medical: Not on file  Tobacco Use  . Smoking status: Former Research scientist (life sciences)  . Smokeless tobacco: Never Used  . Tobacco comment: 02/28/2017 "only smoked in the 1960s when we went out"  Substance and Sexual Activity  . Alcohol use: No  . Drug use: No  . Sexual activity: Never  Lifestyle  . Physical activity:    Days per week: Not on file    Minutes per session: Not on file  . Stress: Not on file  Relationships  . Social connections:    Talks on phone: Not on file    Gets together: Not on file    Attends religious service: Not on file    Active member of club or organization: Not on file    Attends meetings of clubs or organizations: Not on file    Relationship status: Not on file  . Intimate partner violence:    Fear of current or ex partner: Not on file    Emotionally abused: Not on file    Physically abused: Not on file    Forced sexual activity: Not on file  Other Topics Concern  . Not on file  Social History Narrative  . Not on file   Family history No family history of premature CAD or CHF.   Vitals:   09/17/18 1400  BP: (!) 156/67  Pulse: 98  SpO2: 98%  Weight: 101.1 kg (222 lb 12.8 oz)   Wt Readings from Last 3 Encounters:  09/17/18  101.1 kg (222 lb 12.8 oz)  05/30/18 97.3 kg (214 lb 8 oz)  05/28/18 96.2 kg (212 lb 1.9 oz)     PHYSICAL EXAM: General: NAD Neck: No JVD, no thyromegaly or thyroid nodule.  Lungs: Clear to auscultation bilaterally with normal respiratory effort. CV: Nondisplaced PMI.  Heart regular S1/S2, no S3/S4, 1/6 SEM RUSB.  No peripheral edema.  No carotid bruit.  Normal pedal pulses.  Abdomen: Soft, nontender, no hepatosplenomegaly, no distention.  Skin: Intact without lesions or rashes.  Neurologic: Alert and oriented x 3.  Psych: Normal affect. Extremities: No clubbing or cyanosis.  HEENT: Normal.   ASSESSMENT & PLAN:  1. Chronic systolic HF:  Echo with EF 25-30% 02/2017, repeat echo 10/18 showed EF 35-40%. Nonischemic cardiomyopathy. Medtronic device with His bundle lead.  NYHA II currently. She is not volume overloaded and does not get short of breath on flat ground.  I suspect weight gain is due to increased caloric intake with minimal exercise.   - Continue Lasix 20 mg daily.  - Continue Entresto 39/51 bid but stop losartan.   - Increase Toprol XL to 100 mg daily.  - Start spironolactone 12.5 mg daily.  BMET today and again in 10 days.     - Echo at followup appt in 3 months.  2. CKD: Stage III. BMET today.  3. PAF/atrial flutter: She is out of atrial fibrillation currently (a-paced).  - Continue amiodarone.  Check LFTs/TSH today.  She will need regular eye exams.   - Continue Xarelto. CBC today.  4. CAD: Nonobstructive on 4/18 cath.   - LDL 105 in 7/19, increase pravastatin to 80 mg daily at next appointment.  - No ASA given stable CAD and Xarelto use.  5. Bradycardia: Now s/p Medtronic PPM with His bundle pacing.  6. HTN: Increasing Toprol XL and adding spironolactone as above.      Followup in 3 months with echo.   Loralie Champagne, MD 09/17/18

## 2018-09-17 NOTE — Patient Instructions (Signed)
Labs done today  Labs need to be done again in 10-14 days  START spironolactone 12.5mg  (1/2 tab) daily  INCREASED metoprolol to 100mg  (1 tab) daily  STOP taking losartan  Your physician has requested that you have an echocardiogram. Echocardiography is a painless test that uses sound waves to create images of your heart. It provides your doctor with information about the size and shape of your heart and how well your heart's chambers and valves are working. This procedure takes approximately one hour. There are no restrictions for this procedure. This procedure will need to be scheduled for 3 months from now.   Follow up with Dr. Aundra Dubin in 3 months

## 2018-09-27 ENCOUNTER — Encounter: Payer: Self-pay | Admitting: Rheumatology

## 2018-09-27 ENCOUNTER — Encounter: Payer: Self-pay | Admitting: Cardiology

## 2018-09-27 DIAGNOSIS — E113593 Type 2 diabetes mellitus with proliferative diabetic retinopathy without macular edema, bilateral: Secondary | ICD-10-CM | POA: Insufficient documentation

## 2018-10-15 ENCOUNTER — Other Ambulatory Visit (HOSPITAL_COMMUNITY): Payer: Self-pay | Admitting: Cardiology

## 2018-10-29 ENCOUNTER — Ambulatory Visit (INDEPENDENT_AMBULATORY_CARE_PROVIDER_SITE_OTHER): Payer: Medicare Other | Admitting: Cardiology

## 2018-10-29 ENCOUNTER — Ambulatory Visit (INDEPENDENT_AMBULATORY_CARE_PROVIDER_SITE_OTHER): Payer: Medicare Other

## 2018-10-29 ENCOUNTER — Encounter: Payer: Self-pay | Admitting: Cardiology

## 2018-10-29 VITALS — BP 120/60 | HR 67 | Ht 65.0 in | Wt 222.0 lb

## 2018-10-29 DIAGNOSIS — I428 Other cardiomyopathies: Secondary | ICD-10-CM | POA: Diagnosis not present

## 2018-10-29 DIAGNOSIS — I480 Paroxysmal atrial fibrillation: Secondary | ICD-10-CM | POA: Diagnosis not present

## 2018-10-29 DIAGNOSIS — I5022 Chronic systolic (congestive) heart failure: Secondary | ICD-10-CM

## 2018-10-29 DIAGNOSIS — I495 Sick sinus syndrome: Secondary | ICD-10-CM

## 2018-10-29 NOTE — Progress Notes (Signed)
Electrophysiology Office Note   Date:  10/29/2018   ID:  Shannon Lucas, DOB 01-25-1931, MRN [id number]  PCP:  Larey Dresser, MD  Cardiologist:  Aundra Dubin Primary Electrophysiologist:  [name], MD    No chief complaint on file.    History of Present Illness: Shannon Lucas is a 84 y.o. female who is being seen today for the evaluation of CHF at the request of Larey Dresser, MD. Presenting today for electrophysiology evaluation.  She has a history of nonischemic cardiomyopathy, paroxysmal atrial fibrillation on Xarelto and amiodarone, stage III CKD, type 2 diabetes, hyperlipidemia, CVA, and coronary artery disease.  She is currently on Entresto.  She had a recent echocardiogram which showed an improvement of her ejection fraction to 35-40% from 25-30%.  She had a Medtronic dual-chamber pacemaker implanted 11/20/17.  Today, denies symptoms of palpitations, chest pain, shortness of breath, orthopnea, PND, lower extremity edema, claudication, dizziness, presyncope, syncope, bleeding, or neurologic sequela. The patient is tolerating medications without difficulties.  Overall she is feeling well.  She has no chest pain or shortness of breath.  Her main complaint is fatigue and back pain.  She takes Tylenol for her back.  I have encouraged her to talk to her primary physician about the possibility of seeing a specialist.   Past Medical History:  Diagnosis Date  . A-fib ([place])   . Arthritis    "a little bit qwhere" (02/28/2017)  . CHF (congestive heart failure) ([place])   . CKD (chronic kidney disease), stage III ([place])    [name] 02/28/2017  . Depression   . GERD (gastroesophageal reflux disease)   . High cholesterol   . Hypertension   . Melanoma of back ([place])   . Myocardial infarction [facility])    "one dr said I'd had 1" (02/28/2017)  . Stroke [facility]) ~ 2010; ~2012   "affected the right side but I fully recovered; just a light one" (02/28/2017)  . Type II diabetes mellitus ([place])    Past  Surgical History:  Procedure Laterality Date  . APPENDECTOMY    . FALSE ANEURYSM REPAIR Right 03/08/2017   Procedure: REPAIR RIGHT RADIAL FALSE ANEURYSM;  Surgeon: Rosetta Posner, MD;  Location: [place];  Service: Vascular;  Laterality: Right;  . KNEE SURGERY Left 1970s   "I have 1/3 of my knee left in there"  . LAPAROSCOPIC CHOLECYSTECTOMY    . MELANOMA EXCISION     "back"  . PACEMAKER IMPLANT N/A 11/20/2017   Procedure: PACEMAKER IMPLANT;  Surgeon: Constance Haw, MD;  Location: [place] CV LAB;  Service: Cardiovascular;  Laterality: N/A;  . RIGHT/LEFT HEART CATH AND CORONARY ANGIOGRAPHY N/A 03/06/2017   Procedure: Right/Left Heart Cath and Coronary Angiography;  Surgeon: Larey Dresser, MD;  Location: [place] CV LAB;  Service: Cardiovascular;  Laterality: N/A;  . TUBAL LIGATION       Current Outpatient Medications  Medication Sig Dispense Refill  . acetaminophen (TYLENOL) 500 MG tablet Take 1,000 mg by mouth every 6 (six) hours as needed for moderate pain or headache.    Marland Kitchen amiodarone (PACERONE) 200 MG tablet TAKE 1 TABLET BY MOUTH DAILY 30 tablet 3  . ergocalciferol (VITAMIN D2) 50000 units capsule Take 50,000 Units by mouth every Friday. At night    . ferrous sulfate 325 (65 FE) MG tablet Take 1 tablet (325 mg total) by mouth 3 (three) times daily with meals. 90 tablet 0  . furosemide (LASIX) 40 MG tablet Take 0.5 tablets (20  mg total) by mouth daily. 15 tablet 3  . ipratropium (ATROVENT HFA) 17 MCG/ACT inhaler Inhale 2 puffs into the lungs every 6 (six) hours as needed for wheezing. 1 Inhaler 12  . isosorbide mononitrate (IMDUR) 30 MG 24 hr tablet TAKE 1 TABLET BY MOUTH ONCE DAILY    . LANTUS SOLOSTAR 100 UNIT/ML Solostar Pen Inject 50 Units into the skin daily.   3  . metoprolol succinate (TOPROL-XL) 100 MG 24 hr tablet Take 1 tablet (100 mg total) by mouth daily. Take with or immediately following a meal. 30 tablet 3  . pravastatin (PRAVACHOL) 40 MG tablet Take 40 mg by  mouth at bedtime.   2  . sacubitril-valsartan (ENTRESTO) 49-51 MG Take 1 tablet by mouth 2 (two) times daily. 60 tablet 3  . spironolactone (ALDACTONE) 25 MG tablet Take 0.5 tablets (12.5 mg total) by mouth daily. 15 tablet 3  . venlafaxine (EFFEXOR) 75 MG tablet Take 75 mg by mouth daily.     [name] 15 MG TABS tablet TAKE 1 TABLET BY MOUTH DAILY WITH SUPPER 30 tablet 6   No current facility-administered medications for this visit.     Allergies:   Patient has no known allergies.   Social History:  The patient  reports that she has quit smoking. She has never used smokeless tobacco. She reports that she does not drink alcohol or use drugs.   Family History:  The patient's family history includes Diabetes in her mother; Parkinson's disease in her brother; Stroke in her father.    ROS:  Please see the history of present illness.   Otherwise, review of systems is positive for none.   All other systems are reviewed and negative.   PHYSICAL EXAM: VS:  BP 120/60   Pulse 67   Ht 5\' 5"  (1.651 m)   Wt 222 lb (100.7 kg)   BMI 36.94 kg/m  , BMI Body mass index is 36.94 kg/m. GEN: Well nourished, well developed, in no acute distress  HEENT: normal  Neck: no JVD, carotid bruits, or masses Cardiac: RRR; no murmurs, rubs, or gallops,no edema  Respiratory:  clear to auscultation bilaterally, normal work of breathing GI: soft, nontender, nondistended, + BS MS: no deformity or atrophy  Skin: warm and dry, device site well healed Neuro:  Strength and sensation are intact Psych: euthymic mood, full affect  EKG:  EKG is ordered today. Personal review of the ekg ordered shows atrial paced, low voltage, first-degree AV block  Personal review of the device interrogation today. Results in [place]: 09/17/2018: ALT 15; BUN 33; Creatinine, Ser 1.70; Hemoglobin 12.6; Platelets 200; Potassium 4.0; Sodium 138; TSH 4.000    Lipid Panel     Component Value Date/Time   CHOL 184  05/28/2018 1413   TRIG 122 05/28/2018 1413   HDL 55 05/28/2018 1413   CHOLHDL 3.3 05/28/2018 1413   VLDL 24 05/28/2018 1413   LDLCALC 105 (H) 05/28/2018 1413     Wt Readings from Last 3 Encounters:  10/29/18 222 lb (100.7 kg)  09/17/18 222 lb 12.8 oz (101.1 kg)  05/30/18 214 lb 8 oz (97.3 kg)      Other studies Reviewed: Additional studies/ records that were reviewed today include: TTE 08/14/17  Review of the above records today demonstrates:  - Left ventricle: LVEF is depressed at approximately 35 to 40% with   diffuse hypokinesis. The cavity size was mildly dilated. Wall   thickness was increased in a pattern of  mild LVH. The study is   not technically sufficient to allow evaluation of LV diastolic   function. - Aortic valve: AV is thickened, calcified with very mildly   restricted motion. - Mitral valve: Calcified annulus. Mildly thickened leaflets .   There was mild regurgitation. - Left atrium: The atrium was mildly dilated. - Right ventricle: Systolic function was mildly reduced. - Right atrium: The atrium was mildly dilated. - Pulmonary arteries: PA peak pressure: 50 mm Hg (S). - Impressions: Frequent pauses during study.  Holter 09/06/17 - personally reviewed 1. Abnormal holter.  2. Atrial fibrillation at times with RVR noted.  3. I am concerned that there are runs of VT (not just afib with aberrancy).  4. Up to 2 second pauses.    ASSESSMENT AND PLAN:  1.  Chronic systolic heart failure due to nonischemic cardiomyopathy: On Toprol-XL, Entresto, and Aldactone.  Has had improvement in her ejection fraction.  Signs of volume overload.  She does have a repeat echo scheduled for January.  2.  Paroxysmal atrial fibrillation: Only on amiodarone and Xarelto.  Atrial paced.  4% atrial fibrillation noted on device interrogation.  No changes.  This patients CHA2DS2-VASc Score and unadjusted Ischemic Stroke Rate (% per year) is equal to 7.2 % stroke rate/year from a score  of 5  Above score calculated as 1 point each if present [CHF, HTN, DM, Vascular=MI/PAD/Aortic Plaque, Age if 65-74, or Female] Above score calculated as 2 points each if present [Age > 75, or Stroke/TIA/TE]  3.  Tachybradycardia syndrome: Status post Medtronic dual-chamber pacemaker implanted 11/20/2017.  Functioning appropriately.  No changes at this time.  Current medicines are reviewed at length with the patient today.   The patient does not have concerns regarding her medicines.  The following changes were made today: None  Labs/ tests ordered today include:  Orders Placed This Encounter  Procedures  . EKG 12-Lead     Disposition:   FU with Will Camnitz 12 months  Signed, [name], MD  10/29/2018 2:59 PM     [place] [address] [place] [address] [phone] (office) [phone] (fax)

## 2018-10-29 NOTE — Patient Instructions (Signed)
Medication Instructions:  Your physician recommends that you continue on your current medications as directed. Please refer to the Current Medication list given to you today.  *If you need a refill on your cardiac medications before your next appointment, please call your pharmacy*  Labwork: None ordered  Testing/Procedures: None ordered  Follow-Up: Remote monitoring is used to monitor your Pacemaker or ICD from home. This monitoring reduces the number of office visits required to check your device to one time per year. It allows [place] to keep an eye on the functioning of your device to ensure it is working properly. You are scheduled for a device check from home on 01/28/2019. You may send your transmission at any time that day. If you have a wireless device, the transmission will be sent automatically. After your physician reviews your transmission, you will receive a postcard with your next transmission date.  Your physician wants you to follow-up in: 1 year with Dr. Curt Bears.  You will receive a reminder letter in the mail two months in advance. If you don't receive a letter, please call our office to schedule the follow-up appointment.  Thank you for choosing [facility]!!   Trinidad Curet, RN [phone]  Any Other Special Instructions Will Be Listed Below (If Applicable).

## 2018-10-29 NOTE — Progress Notes (Signed)
Remote pacemaker transmission.   

## 2018-12-04 ENCOUNTER — Encounter (HOSPITAL_COMMUNITY): Payer: Self-pay | Admitting: Emergency Medicine

## 2018-12-04 ENCOUNTER — Emergency Department (HOSPITAL_COMMUNITY): Payer: Medicare Other

## 2018-12-04 ENCOUNTER — Emergency Department (HOSPITAL_COMMUNITY)
Admission: EM | Admit: 2018-12-04 | Discharge: 2018-12-04 | Disposition: A | Discharge status: Home / Self Care | Payer: Medicare Other | Attending: Emergency Medicine | Admitting: Emergency Medicine

## 2018-12-04 DIAGNOSIS — Z79899 Other long term (current) drug therapy: Secondary | ICD-10-CM | POA: Insufficient documentation

## 2018-12-04 DIAGNOSIS — Z7902 Long term (current) use of antithrombotics/antiplatelets: Secondary | ICD-10-CM | POA: Insufficient documentation

## 2018-12-04 DIAGNOSIS — X58XXXA Exposure to other specified factors, initial encounter: Secondary | ICD-10-CM | POA: Diagnosis not present

## 2018-12-04 DIAGNOSIS — N183 Chronic kidney disease, stage 3 (moderate): Secondary | ICD-10-CM | POA: Diagnosis not present

## 2018-12-04 DIAGNOSIS — Y929 Unspecified place or not applicable: Secondary | ICD-10-CM | POA: Insufficient documentation

## 2018-12-04 DIAGNOSIS — Y999 Unspecified external cause status: Secondary | ICD-10-CM | POA: Insufficient documentation

## 2018-12-04 DIAGNOSIS — I130 Hypertensive heart and chronic kidney disease with heart failure and stage 1 through stage 4 chronic kidney disease, or unspecified chronic kidney disease: Secondary | ICD-10-CM | POA: Diagnosis not present

## 2018-12-04 DIAGNOSIS — Z950 Presence of cardiac pacemaker: Secondary | ICD-10-CM | POA: Insufficient documentation

## 2018-12-04 DIAGNOSIS — E1122 Type 2 diabetes mellitus with diabetic chronic kidney disease: Secondary | ICD-10-CM | POA: Insufficient documentation

## 2018-12-04 DIAGNOSIS — Z87891 Personal history of nicotine dependence: Secondary | ICD-10-CM | POA: Insufficient documentation

## 2018-12-04 DIAGNOSIS — Z794 Long term (current) use of insulin: Secondary | ICD-10-CM | POA: Diagnosis not present

## 2018-12-04 DIAGNOSIS — S22080A Wedge compression fracture of T11-T12 vertebra, initial encounter for closed fracture: Secondary | ICD-10-CM | POA: Insufficient documentation

## 2018-12-04 DIAGNOSIS — Y939 Activity, unspecified: Secondary | ICD-10-CM | POA: Insufficient documentation

## 2018-12-04 DIAGNOSIS — R1032 Left lower quadrant pain: Secondary | ICD-10-CM | POA: Diagnosis present

## 2018-12-04 DIAGNOSIS — I5021 Acute systolic (congestive) heart failure: Secondary | ICD-10-CM | POA: Insufficient documentation

## 2018-12-04 LAB — CBC WITH DIFFERENTIAL/PLATELET
ABS IMMATURE GRANULOCYTES: 0.03 10*3/uL (ref 0.00–0.07)
BASOS PCT: 0 %
Basophils Absolute: 0 10*3/uL (ref 0.0–0.1)
Eosinophils Absolute: 0.1 10*3/uL (ref 0.0–0.5)
Eosinophils Relative: 1 %
HCT: 37.1 % (ref 36.0–46.0)
HEMOGLOBIN: 11.5 g/dL — AB (ref 12.0–15.0)
Immature Granulocytes: 0 %
LYMPHS PCT: 12 %
Lymphs Abs: 1.1 10*3/uL (ref 0.7–4.0)
MCH: 30.5 pg (ref 26.0–34.0)
MCHC: 31 g/dL (ref 30.0–36.0)
MCV: 98.4 fL (ref 80.0–100.0)
MONO ABS: 0.6 10*3/uL (ref 0.1–1.0)
Monocytes Relative: 7 %
NEUTROS ABS: 7.3 10*3/uL (ref 1.7–7.7)
Neutrophils Relative %: 80 %
Platelets: 193 10*3/uL (ref 150–400)
RBC: 3.77 MIL/uL — AB (ref 3.87–5.11)
RDW: 13.4 % (ref 11.5–15.5)
WBC: 9.1 10*3/uL (ref 4.0–10.5)
nRBC: 0 % (ref 0.0–0.2)

## 2018-12-04 LAB — COMPREHENSIVE METABOLIC PANEL
ALK PHOS: 83 U/L (ref 38–126)
ALT: 13 U/L (ref 0–44)
AST: 16 U/L (ref 15–41)
Albumin: 3.3 g/dL — ABNORMAL LOW (ref 3.5–5.0)
Anion gap: 9 (ref 5–15)
BUN: 40 mg/dL — ABNORMAL HIGH (ref 8–23)
CALCIUM: 9.3 mg/dL (ref 8.9–10.3)
CO2: 22 mmol/L (ref 22–32)
CREATININE: 1.95 mg/dL — AB (ref 0.44–1.00)
Chloride: 108 mmol/L (ref 98–111)
GFR, EST AFRICAN AMERICAN: 27 mL/min — AB (ref >60)
GFR, EST NON AFRICAN AMERICAN: 23 mL/min — AB (ref >60)
Glucose, Bld: 177 mg/dL — ABNORMAL HIGH (ref 70–99)
Potassium: 3.9 mmol/L (ref 3.5–5.1)
SODIUM: 139 mmol/L (ref 135–145)
TOTAL PROTEIN: 6.2 g/dL — AB (ref 6.5–8.1)
Total Bilirubin: 0.8 mg/dL (ref 0.3–1.2)

## 2018-12-04 LAB — URINALYSIS, ROUTINE W REFLEX MICROSCOPIC
BILIRUBIN URINE: NEGATIVE
Glucose, UA: NEGATIVE mg/dL
Hgb urine dipstick: NEGATIVE
KETONES UR: NEGATIVE mg/dL
LEUKOCYTES UA: NEGATIVE
NITRITE: NEGATIVE
PROTEIN: NEGATIVE mg/dL
Specific Gravity, Urine: 1.01 (ref 1.005–1.030)
pH: 5 (ref 5.0–8.0)

## 2018-12-04 MED ORDER — HYDROCODONE-ACETAMINOPHEN 5-325 MG PO TABS
1.0000 | ORAL_TABLET | Freq: Once | ORAL | Status: AC
  Administered 2018-12-04: 1 via ORAL
  Filled 2018-12-04: qty 1

## 2018-12-04 MED ORDER — HYDROCODONE-ACETAMINOPHEN 5-325 MG PO TABS: 1.0000 | ORAL_TABLET | Freq: Four times a day (QID) | ORAL | 0 refills | Status: DC | PRN

## 2018-12-04 NOTE — ED Triage Notes (Signed)
Pt transported from home by EMS c/o L lower back/flank pain. Pain onset 3 hours ago, pain occurs with movement or twisting. Pt denies vomiting/fever, denies urinary s/s.

## 2018-12-04 NOTE — ED Notes (Signed)
Patient verbalizes understanding of discharge instructions. Opportunity for questioning and answers were provided. Armband removed by staff, pt discharged from ED in wheelchair.  

## 2018-12-04 NOTE — Progress Notes (Signed)
Orthopedic Tech Progress Note Patient Details:  Shannon Lucas [birth year] [id number]  Patient ID: [name], female   DOB: January 11, 1931, 85 y.o.   MRN: [id number] Called in order to bio tech.  Karolee Stamps 12/04/2018, 3:22 AM

## 2018-12-04 NOTE — ED Provider Notes (Signed)
[facility] EMERGENCY DEPARTMENT Provider Note   CSN: [id number] Arrival date & time: 12/04/18  0048     History   Chief Complaint Chief Complaint  Patient presents with  . Flank Pain    HPI Shannon Lucas is a 85 y.o. female.  Patient presents to the emergency department with a chief complaint of left flank pain.  She states the symptoms started about 5 PM yesterday.  She denies any nausea, vomiting, or diarrhea.  Denies any cough or fever.  She states that she had a fall about 2 weeks ago and has significant bruising in her right leg.  She is anticoagulated on Xarelto for A. fib.  She denies any new falls.  Denies any pain with urination or hematuria.  She states that the symptoms are worsened with palpation and movement.  Rates pain is severe when she is moving.  She has not taken anything for her symptoms.  The history is provided by the patient. No language interpreter was used.    Past Medical History:  Diagnosis Date  . A-fib ([place])   . Arthritis    "a little bit qwhere" (02/28/2017)  . CHF (congestive heart failure) ([place])   . CKD (chronic kidney disease), stage III ([place])    [name] 02/28/2017  . Depression   . GERD (gastroesophageal reflux disease)   . High cholesterol   . Hypertension   . Melanoma of back ([place])   . Myocardial infarction [facility])    "one dr said I'd had 1" (02/28/2017)  . Stroke [facility]) ~ 2010; ~2012   "affected the right side but I fully recovered; just a light one" (02/28/2017)  . Type II diabetes mellitus [facility])     Patient Active Problem List   Diagnosis Date Noted  . Tachy-brady syndrome ([place]) 11/20/2017  . Chronic systolic heart failure ([place]) 03/15/2017  . Pseudoaneurysm following procedure ([place])   . Acute systolic congestive heart failure ([place])   . AKI (acute kidney injury) ([place])   . CHF exacerbation ([place]) 03/01/2017  . Fall   . Anemia   . CKD (chronic kidney disease) stage 3, GFR 30-59 ml/min (H[facility])   . Diabetes mellitus type  II, controlled ([place])   . Paroxysmal atrial fibrillation ([facility])   . Chronic anticoagulation   . Pure hypercholesterolemia   . Shortness of breath 02/28/2017    Past Surgical History:  Procedure Laterality Date  . APPENDECTOMY    . FALSE ANEURYSM REPAIR Right 03/08/2017   Procedure: REPAIR RIGHT RADIAL FALSE ANEURYSM;  Surgeon: Rosetta Posner, MD;  Location: [facility];  Service: Vascular;  Laterality: Right;  . KNEE SURGERY Left 1970s   "I have 1/3 of my knee left in there"  . LAPAROSCOPIC CHOLECYSTECTOMY    . MELANOMA EXCISION     "back"  . PACEMAKER IMPLANT N/A 11/20/2017   Procedure: PACEMAKER IMPLANT;  Surgeon: Constance Haw, MD;  Location: [facility];  Service: Cardiovascular;  Laterality: N/A;  . RIGHT/LEFT HEART CATH AND CORONARY ANGIOGRAPHY N/A 03/06/2017   Procedure: Right/Left Heart Cath and Coronary Angiography;  Surgeon: Larey Dresser, MD;  Location: [facility];  Service: Cardiovascular;  Laterality: N/A;  . TUBAL LIGATION       OB History   No obstetric history on file.      Home Medications    Prior to Admission medications   Medication Sig Start Date End Date Taking? Authorizing Provider  acetaminophen (TYLENOL) 500 MG tablet Take 1,000 mg  by mouth every 6 (six) hours as needed for moderate pain or headache.    [provider]  amiodarone (PACERONE) 200 MG tablet TAKE 1 TABLET BY MOUTH DAILY 08/30/18   Bensimhon, Shaune Pascal, MD  ergocalciferol (VITAMIN D2) 50000 units capsule Take 50,000 Units by mouth every Friday. At night    [provider]  ferrous sulfate 325 (65 FE) MG tablet Take 1 tablet (325 mg total) by mouth 3 (three) times daily with meals. 03/07/17   Bufford Lope, DO  furosemide (LASIX) 40 MG tablet Take 0.5 tablets (20 mg total) by mouth daily. 05/29/18   Larey Dresser, MD  ipratropium (ATROVENT HFA) 17 MCG/ACT inhaler Inhale 2 puffs into the lungs every 6 (six) hours as needed for wheezing. 09/06/17   Larey Dresser,  MD  isosorbide mononitrate (IMDUR) 30 MG 24 hr tablet TAKE 1 TABLET BY MOUTH ONCE DAILY 06/20/16   [provider]  LANTUS SOLOSTAR 100 UNIT/ML Solostar Pen Inject 50 Units into the skin daily.  02/24/17   [provider]  metoprolol succinate (TOPROL-XL) 100 MG 24 hr tablet Take 1 tablet (100 mg total) by mouth daily. Take with or immediately following a meal. 09/17/18 09/17/19  Larey Dresser, MD  pravastatin (PRAVACHOL) 40 MG tablet Take 40 mg by mouth at bedtime.  12/08/16   [provider]  sacubitril-valsartan (ENTRESTO) 49-51 MG Take 1 tablet by mouth 2 (two) times daily. 09/17/18   Larey Dresser, MD  spironolactone (ALDACTONE) 25 MG tablet Take 0.5 tablets (12.5 mg total) by mouth daily. 09/17/18 12/16/18  Larey Dresser, MD  venlafaxine (EFFEXOR) 75 MG tablet Take 75 mg by mouth daily.  02/03/17   [provider]  XARELTO 15 MG TABS tablet TAKE 1 TABLET BY MOUTH DAILY WITH SUPPER 10/15/18   Larey Dresser, MD    Family History Family History  Problem Relation Age of Onset  . Diabetes Mother   . Stroke Father   . Parkinson's disease Brother     Social History Social History   Tobacco Use  . Smoking status: Former Research scientist (life sciences)  . Smokeless tobacco: Never Used  . Tobacco comment: 02/28/2017 "only smoked in the 1960s when we went out"  Substance Use Topics  . Alcohol use: No  . Drug use: No     Allergies   Patient has no known allergies.   Review of Systems Review of Systems  All other systems reviewed and are negative.    Physical Exam Updated Vital Signs BP (!) 157/75 (BP Location: Left Arm)   Pulse 64   Temp 98.4 F (36.9 C) (Oral)   Resp 18   Ht 5\' 5"  (1.651 m)   Wt 100.7 kg   SpO2 100%   BMI 36.94 kg/m   Physical Exam Vitals signs and nursing note reviewed.  Constitutional:      Appearance: She is well-developed.  HENT:     Head: Normocephalic and atraumatic.  Eyes:     Conjunctiva/sclera: Conjunctivae normal.      Pupils: Pupils are equal, round, and reactive to light.     Comments: Healing right-sided periorbital contusion  Neck:     Musculoskeletal: Normal range of motion and neck supple.  Cardiovascular:     Rate and Rhythm: Normal rate and regular rhythm.     Heart sounds: No murmur. No friction rub. No gallop.   Pulmonary:     Effort: Pulmonary effort is normal. No respiratory distress.  Breath sounds: Normal breath sounds. No wheezing or rales.  Chest:     Chest wall: No tenderness.  Abdominal:     General: Bowel sounds are normal. There is no distension.     Palpations: Abdomen is soft. There is no mass.     Tenderness: There is abdominal tenderness. There is no guarding or rebound.     Comments: Left flank very tender to palpation No ecchymosis over the left flank  Musculoskeletal: Normal range of motion.        General: No tenderness.  Skin:    General: Skin is warm and dry.  Neurological:     Mental Status: She is alert and oriented to person, place, and time.  Psychiatric:        Behavior: Behavior normal.        Thought Content: Thought content normal.        Judgment: Judgment normal.      ED Treatments / Results  Labs (all labs ordered are listed, but only abnormal results are displayed) Labs Reviewed  CBC WITH DIFFERENTIAL/PLATELET  COMPREHENSIVE METABOLIC PANEL  URINALYSIS, ROUTINE W REFLEX MICROSCOPIC    EKG None  Radiology No results found.  Procedures Procedures (including critical care time)  Medications Ordered in ED Medications - No data to display   Initial Impression / Assessment and Plan / ED Course  I have reviewed the triage vital signs and the nursing notes.  Pertinent labs & imaging results that were available during my care of the patient were reviewed by me and considered in my medical decision making (see chart for details).     Patient with severe left flank and low back pain.  Onset was earlier this evening.  Did have a recent  fall and sustained significant bruising to her right leg and right eye.  She was not evaluated for this.  She is anticoagulated.  Given that she had not been evaluated, no scans have been performed, I do feel that imaging is indicated.  We will need to ensure that there is no retroperitoneal bleeding, no other intra-abdominal injuries, or rib and pelvis fractures.  CT shows evidence of T11 compression fracture.  She is tender around this, and I do believe this to be the source of the patient's pain.  Will give TLSO brace, and recommend spine follow-up.  Patient seen by discussed with Dr. Christy Gentles.  Patient and patient's family understand and agree with the plan.  She is stable and ready for discharge. Final Clinical Impressions(s) / ED Diagnoses   Final diagnoses:  Compression fracture of T11 vertebra, initial encounter [facility])    ED Discharge Orders         Ordered    HYDROcodone-acetaminophen (NORCO/VICODIN) 5-325 MG tablet  Every 6 hours PRN     12/04/18 0422           Montine Circle, PA-C 12/04/18 0424    Ripley Fraise, MD 12/04/18 [phone]

## 2018-12-04 NOTE — ED Provider Notes (Signed)
Patient seen/examined in the Emergency Department in conjunction with Midlevel Provider [facility] Patient reports left flank pain. Exam : Awake alert, left CVA tenderness, no abdominal tenderness Plan: Labs and imaging pending at this time   Ripley Fraise, MD 12/04/18 [phone]

## 2018-12-08 LAB — CUP PACEART REMOTE DEVICE CHECK
Battery Remaining Longevity: 98 mo
Battery Voltage: 3.01 V
Brady Statistic AS VP Percent: 1.85 %
Brady Statistic RA Percent Paced: 92.03 %
Brady Statistic RV Percent Paced: 21.23 %
Implantable Lead Implant Date: 20190107
Implantable Lead Implant Date: 20190107
Implantable Lead Location: 753859
Implantable Lead Location: 753860
Implantable Lead Model: 3830
Implantable Lead Model: 5076
Implantable Pulse Generator Implant Date: 20190107
Lead Channel Pacing Threshold Amplitude: 1.25 V
Lead Channel Pacing Threshold Pulse Width: 0.4 ms
Lead Channel Sensing Intrinsic Amplitude: 1.25 mV
Lead Channel Sensing Intrinsic Amplitude: 1.5 mV
Lead Channel Setting Pacing Amplitude: 3 V
Lead Channel Setting Sensing Sensitivity: 0.9 mV
MDC IDC MSMT LEADCHNL RA IMPEDANCE VALUE: 380 Ohm
MDC IDC MSMT LEADCHNL RA IMPEDANCE VALUE: 475 Ohm
MDC IDC MSMT LEADCHNL RA SENSING INTR AMPL: 1.25 mV
MDC IDC MSMT LEADCHNL RV IMPEDANCE VALUE: 285 Ohm
MDC IDC MSMT LEADCHNL RV IMPEDANCE VALUE: 361 Ohm
MDC IDC MSMT LEADCHNL RV SENSING INTR AMPL: 1.5 mV
MDC IDC SESS DTM: 20191216045620
MDC IDC SET LEADCHNL RA PACING AMPLITUDE: 2.5 V
MDC IDC SET LEADCHNL RV PACING PULSEWIDTH: 1 ms
MDC IDC STAT BRADY AP VP PERCENT: 17.24 %
MDC IDC STAT BRADY AP VS PERCENT: 79.57 %
MDC IDC STAT BRADY AS VS PERCENT: 1.46 %

## 2018-12-18 ENCOUNTER — Ambulatory Visit (HOSPITAL_BASED_OUTPATIENT_CLINIC_OR_DEPARTMENT_OTHER)
Admission: RE | Admit: 2018-12-18 | Discharge: 2018-12-18 | Disposition: A | Discharge status: Home / Self Care | Payer: Medicare Other | Source: Ambulatory Visit | Attending: Cardiology | Admitting: Cardiology

## 2018-12-18 ENCOUNTER — Ambulatory Visit (HOSPITAL_COMMUNITY)
Admission: RE | Admit: 2018-12-18 | Discharge: 2018-12-18 | Disposition: A | Discharge status: Home / Self Care | Payer: Medicare Other | Source: Ambulatory Visit | Attending: Cardiology | Admitting: Cardiology

## 2018-12-18 ENCOUNTER — Encounter (HOSPITAL_COMMUNITY): Payer: Self-pay | Admitting: Cardiology

## 2018-12-18 VITALS — BP 138/88 | HR 72 | Wt 220.0 lb

## 2018-12-18 DIAGNOSIS — K219 Gastro-esophageal reflux disease without esophagitis: Secondary | ICD-10-CM | POA: Diagnosis not present

## 2018-12-18 DIAGNOSIS — R001 Bradycardia, unspecified: Secondary | ICD-10-CM | POA: Diagnosis not present

## 2018-12-18 DIAGNOSIS — Z87891 Personal history of nicotine dependence: Secondary | ICD-10-CM | POA: Insufficient documentation

## 2018-12-18 DIAGNOSIS — I428 Other cardiomyopathies: Secondary | ICD-10-CM | POA: Insufficient documentation

## 2018-12-18 DIAGNOSIS — D649 Anemia, unspecified: Secondary | ICD-10-CM | POA: Diagnosis not present

## 2018-12-18 DIAGNOSIS — Z8673 Personal history of transient ischemic attack (TIA), and cerebral infarction without residual deficits: Secondary | ICD-10-CM | POA: Insufficient documentation

## 2018-12-18 DIAGNOSIS — I350 Nonrheumatic aortic (valve) stenosis: Secondary | ICD-10-CM | POA: Insufficient documentation

## 2018-12-18 DIAGNOSIS — I5022 Chronic systolic (congestive) heart failure: Secondary | ICD-10-CM

## 2018-12-18 DIAGNOSIS — I2510 Atherosclerotic heart disease of native coronary artery without angina pectoris: Secondary | ICD-10-CM | POA: Insufficient documentation

## 2018-12-18 DIAGNOSIS — I130 Hypertensive heart and chronic kidney disease with heart failure and stage 1 through stage 4 chronic kidney disease, or unspecified chronic kidney disease: Secondary | ICD-10-CM | POA: Insufficient documentation

## 2018-12-18 DIAGNOSIS — Z794 Long term (current) use of insulin: Secondary | ICD-10-CM | POA: Diagnosis not present

## 2018-12-18 DIAGNOSIS — Z79899 Other long term (current) drug therapy: Secondary | ICD-10-CM | POA: Diagnosis not present

## 2018-12-18 DIAGNOSIS — E785 Hyperlipidemia, unspecified: Secondary | ICD-10-CM | POA: Insufficient documentation

## 2018-12-18 DIAGNOSIS — Z950 Presence of cardiac pacemaker: Secondary | ICD-10-CM | POA: Diagnosis not present

## 2018-12-18 DIAGNOSIS — E1122 Type 2 diabetes mellitus with diabetic chronic kidney disease: Secondary | ICD-10-CM | POA: Insufficient documentation

## 2018-12-18 DIAGNOSIS — N183 Chronic kidney disease, stage 3 (moderate): Secondary | ICD-10-CM | POA: Diagnosis not present

## 2018-12-18 DIAGNOSIS — I480 Paroxysmal atrial fibrillation: Secondary | ICD-10-CM | POA: Insufficient documentation

## 2018-12-18 DIAGNOSIS — Z7901 Long term (current) use of anticoagulants: Secondary | ICD-10-CM | POA: Insufficient documentation

## 2018-12-18 MED ORDER — PRAVASTATIN SODIUM 80 MG PO TABS: 80.0000 mg | ORAL_TABLET | Freq: Every day | ORAL | 3 refills | Status: DC

## 2018-12-18 MED ORDER — XARELTO 15 MG PO TABS: ORAL_TABLET | ORAL | 6 refills | Status: DC

## 2018-12-18 NOTE — Patient Instructions (Signed)
EKG was completed today.  INCREASE Pravastatin to 80mg  a day.   80mg  Pravastatin and a Xarelto refill have been sent to your pharmacy.   Please follow up with lab work in 2 weeks.  Your physician recommends that you schedule a follow-up appointment in 3 months with Dr Aundra Dubin.

## 2018-12-18 NOTE — Progress Notes (Signed)
Advanced Heart Failure Clinic Note   Primary Care: [facility], [place] [facility] Cardiology: Dr. Aundra Dubin  HPI:  Shannon Lucas is a 85 y.o. female with PMH of Systolic CHF,  PAF on Xarelto, CKD stage III, DM II, GERD, HLD, CVA, anemia.   Admitted 4/17 -> 9/52/84 with A/C systolic CHF. L/RHC as below with normal coronaries and elevated filling pressures. Diuresed 5 lbs. Hospital course complicated by hematoma at radial cath site that improved overnight. Medications adjusted as tolerated. Discharge weight 205 lbs.    Pt seen in ED 03/08/17 for worsening of hematoma as above.  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 Dr. Donnetta Hutching saw in consult and repaired under local anaesthesia and sedation.   Echo in 10/18 showed EF 35-40% with diffuse hypokinesis, PASP 50 mmHg.  Holter in 10/18 showed runs of atrial fibrillation including afib with aberrancy.  Bradycardia into the 30s was also noted.  Decision was made to place PPM => patient got Medtronic device with His bundle lead and is now His pacing.    She presents today for regular follow up. Last visit Toprol increased and spiro added. Has had "ups and downs". She had a significant fall 4 weeks ago and had a back fracture. She is in a brace for the near future. She tripped over items in her garage. Denies syncope, near syncope, lightheadedness, or dizziness surrounding the fall. She is more SOB in the ams. Takes her a long time in the mornings to get "up and going". Mild/Mod SOB changing clothes and bathing. She is having lightheadedness several times a week, blood sugar has been contributing. Taking all medications as directed except looks like she is still taking both losartan and Entresto (had been told to stop losartan at last appointment).   ECG (personally reviewed): a-paced, left axis deviation, poor RWP  Labs (04/06/17): K 5.0, Creatinine 2.04, BUN 43, LFTs normal Labs (6/18): K 3.9, creatinine 1.68 Labs (8/18): LDL 83, HDL 51, hgb 11.1, K 4.5, creatinine 1.48, LFTs  normal, TSH normal.  Labs (1/19): K 4, creatinine 1.5, hgb 12.2, TSH normal, LFTs normal Labs (7/19): K 4.3, creatinine 1.78, LDL 105, TSH normal, LFTs normal Labs (1/20): K 3.9, creatinine 1.95  Review of systems complete and found to be negative unless listed in HPI.    PMH 1. Chronic systolic CHF: Nonischemic cardiomyopathy.  - Echo 03/01/17 LVEF 25-30%, At least mild AS, Mild MR, Mod LAE, Mod RAE, Trivial PI, PA peak pressure 42 mm Hg. - LHC (4/18) with nonobstructive CAD.  - Echo (10/18): EF 35-40%, diffuse hypokinesis, mild LVH, mild MR, mild AS, PASP 50 mmHg.  - Medtronic PPM with His bundle lead placed.  - Echo (2/20): EF 60-65%, mild LVH, normal RV size and systolic function.  2. Atrial fibrillation: Paroxysmal. On Xarelto and amiodarone.  - Atypical atrial flutter noted 05/28/18.  3. CKD Stage III 4. DMII 5. GERD 6. HLD 7. Hx of CVA - ?2011-2012 with no lasting deficit 8. Chronic anemia 9. CAD: LHC (4/18) with 70% ostial D1, 50% ostial D2, 40% ostial OM1.  10. Aortic stenosis: Mild on echo in 10/18.  11. 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   Current Outpatient Medications  Medication Sig Dispense Refill  . acetaminophen (TYLENOL) 500 MG tablet Take 1,000 mg by mouth every 6 (six) hours as needed for moderate pain or headache.    Marland Kitchen amiodarone (PACERONE) 200 MG tablet TAKE 1 TABLET BY MOUTH DAILY 30 tablet 3  . benzonatate (TESSALON) 100 MG  capsule Take 100 mg by mouth 3 (three) times daily as needed for cough.    . ferrous sulfate 325 (65 FE) MG tablet Take 1 tablet (325 mg total) by mouth 3 (three) times daily with meals. 90 tablet 0  . furosemide (LASIX) 40 MG tablet Take 0.5 tablets (20 mg total) by mouth daily. 15 tablet 3  . HYDROcodone-acetaminophen (NORCO/VICODIN) 5-325 MG tablet Take 1 tablet by mouth every 6 (six) hours as needed. 10 tablet 0  . ipratropium (ATROVENT HFA) 17 MCG/ACT inhaler Inhale 2 puffs into the lungs every 6 (six) hours as needed for  wheezing. 1 Inhaler 12  . isosorbide mononitrate (IMDUR) 30 MG 24 hr tablet TAKE 1 TABLET BY MOUTH ONCE DAILY    . LANTUS SOLOSTAR 100 UNIT/ML Solostar Pen Inject 50 Units into the skin daily.   3  . losartan (COZAAR) 100 MG tablet Take 100 mg by mouth daily.    . metoprolol succinate (TOPROL-XL) 100 MG 24 hr tablet Take 1 tablet (100 mg total) by mouth daily. Take with or immediately following a meal. 30 tablet 3  . pravastatin (PRAVACHOL) 40 MG tablet Take 40 mg by mouth at bedtime.   2  . sacubitril-valsartan (ENTRESTO) 49-51 MG Take 1 tablet by mouth 2 (two) times daily. 60 tablet 3  . venlafaxine (EFFEXOR) 75 MG tablet Take 75 mg by mouth daily.     [name] 15 MG TABS tablet TAKE 1 TABLET BY MOUTH DAILY WITH SUPPER 30 tablet 6  . ergocalciferol (VITAMIN D2) 50000 units capsule Take 50,000 Units by mouth every Friday. At night    . spironolactone (ALDACTONE) 25 MG tablet Take 0.5 tablets (12.5 mg total) by mouth daily. 15 tablet 3   No current facility-administered medications for this encounter.    No Known Allergies   Social History   Socioeconomic History  . Marital status: Widowed    Spouse name: Not on file  . Number of children: Not on file  . Years of education: Not on file  . Highest education level: Not on file  Occupational History  . Not on file  Social Needs  . Financial resource strain: Not on file  . Food insecurity:    Worry: Not on file    Inability: Not on file  . Transportation needs:    Medical: Not on file    Non-medical: Not on file  Tobacco Use  . Smoking status: Former Research scientist (life sciences)  . Smokeless tobacco: Never Used  . Tobacco comment: 02/28/2017 "only smoked in the 1960s when we went out"  Substance and Sexual Activity  . Alcohol use: No  . Drug use: No  . Sexual activity: Never  Lifestyle  . Physical activity:    Days per week: Not on file    Minutes per session: Not on file  . Stress: Not on file  Relationships  . Social connections:    Talks on  phone: Not on file    Gets together: Not on file    Attends religious service: Not on file    Active member of club or organization: Not on file    Attends meetings of clubs or organizations: Not on file    Relationship status: Not on file  . Intimate partner violence:    Fear of current or ex partner: Not on file    Emotionally abused: Not on file    Physically abused: Not on file    Forced sexual activity: Not on file  Other Topics  Concern  . Not on file  Social History Narrative  . Not on file   Family history No family history of premature CAD or CHF.   Vitals:   12/18/18 1509  BP: 138/88  Pulse: 72  SpO2: 96%  Weight: 99.8 kg (220 lb)     Wt Readings from Last 3 Encounters:  12/18/18 99.8 kg (220 lb)  12/04/18 100.7 kg (222 lb)  10/29/18 100.7 kg (222 lb)    PHYSICAL EXAM: General: NAD HEENT: Normal Neck: Supple. JVP not elevated. Carotids 2+ bilat; no bruits. No thyromegaly or nodule noted. Cor: PMI nondisplaced. RRR, No M/G/R noted. 1/6 SEM RUSB.  Lungs: CTAB, normal effort. Abdomen: Soft, non-tender, non-distended, no HSM. No bruits or masses. +BS  Extremities: No cyanosis, clubbing, or rash. R and LLE no edema.  Neuro: Alert & orientedx3, cranial nerves grossly intact. moves all 4 extremities w/o difficulty. Affect pleasant   ASSESSMENT & PLAN:  1. Chronic systolic HF:  Echo with EF 25-30% 02/2017, repeat echo 10/18 showed EF 35-40%. Nonischemic cardiomyopathy. Medtronic device with His bundle lead.  NYHA II currently. Volume status not elevated and she denies shortness of breath on flat ground.  - Echo today shows EF improved to 60-65%.  - Continue Lasix 20 mg daily.  - Continue Entresto 49/51 mg BID.  - Continue Toprol XL 100 mg daily.  - Continue spironolactone 12.5 mg daily.  - Reinforced fluid restriction to < 2 L daily, sodium restriction to less than 2000 mg daily, and the importance of daily weights.   2. CKD: Stage III - BMET today.  3.  PAF/atrial flutter:  - Regular on exam.  - Continue amiodarone.  TSH/LFTs normal 09/17/2018. She will need regular eye exams.   - Continue Xarelto. Hgb 11.5 12/04/18.  4. CAD: Nonobstructive on 4/18 cath.   - No s/s of ischemia.    - LDL 105 in 7/19. Increase pravastatin 80 mg daily with lipids/LFTs in 2 months.   - No ASA given stable CAD and Xarelto use.  5. Bradycardia:  - She is s/p Medtronic PPM with His bundle pacing.  6. HTN:  - Meds as above.    Shirley Friar, PA-C 12/18/18   Patient seen with PA, agree with the above note.    She had a fall with vertebral fracture about a month ago, says she tripped and did not pass out.  She has mild lightheadedness when standing usually first thing in the morning.  Of note, she appears to be taking both losartan and Entresto still.  - I told her to stop losartan and took the bottle from her.  She will continue Entresto. Repeat BMET 2 wks.   Increase pravastatin to 80 mg daily, repeat lipids/LFTs in 2 months.   Echo was reviewed today, EF has improved to 60-65%.    Followup in 3 months.   Loralie Champagne 12/18/2018 4:35 PM

## 2018-12-18 NOTE — Progress Notes (Signed)
  Echocardiogram 2D Echocardiogram has been performed.  Shannon Lucas 12/18/2018, 3:00 PM

## 2019-01-01 ENCOUNTER — Ambulatory Visit (HOSPITAL_COMMUNITY)
Admission: RE | Admit: 2019-01-01 | Discharge: 2019-01-01 | Disposition: A | Discharge status: Home / Self Care | Payer: Medicare Other | Source: Ambulatory Visit | Attending: Cardiology | Admitting: Cardiology

## 2019-01-01 DIAGNOSIS — I5022 Chronic systolic (congestive) heart failure: Secondary | ICD-10-CM | POA: Insufficient documentation

## 2019-01-01 LAB — BASIC METABOLIC PANEL
ANION GAP: 10 (ref 5–15)
BUN: 41 mg/dL — ABNORMAL HIGH (ref 8–23)
CO2: 23 mmol/L (ref 22–32)
Calcium: 9.1 mg/dL (ref 8.9–10.3)
Chloride: 106 mmol/L (ref 98–111)
Creatinine, Ser: 1.87 mg/dL — ABNORMAL HIGH (ref 0.44–1.00)
GFR calc non Af Amer: 24 mL/min — ABNORMAL LOW (ref >60)
GFR, EST AFRICAN AMERICAN: 28 mL/min — AB (ref >60)
Glucose, Bld: 181 mg/dL — ABNORMAL HIGH (ref 70–99)
Potassium: 4.2 mmol/L (ref 3.5–5.1)
Sodium: 139 mmol/L (ref 135–145)

## 2019-01-14 ENCOUNTER — Other Ambulatory Visit (HOSPITAL_COMMUNITY): Payer: Self-pay | Admitting: Cardiology

## 2019-01-15 ENCOUNTER — Other Ambulatory Visit (HOSPITAL_COMMUNITY): Payer: Self-pay | Admitting: Cardiology

## 2019-01-25 ENCOUNTER — Other Ambulatory Visit (HOSPITAL_COMMUNITY): Payer: Self-pay | Admitting: Cardiology

## 2019-01-28 ENCOUNTER — Ambulatory Visit (INDEPENDENT_AMBULATORY_CARE_PROVIDER_SITE_OTHER): Payer: Medicare Other | Admitting: *Deleted

## 2019-01-28 ENCOUNTER — Other Ambulatory Visit: Payer: Self-pay

## 2019-01-28 DIAGNOSIS — I495 Sick sinus syndrome: Secondary | ICD-10-CM

## 2019-01-28 DIAGNOSIS — I428 Other cardiomyopathies: Secondary | ICD-10-CM

## 2019-01-29 LAB — CUP PACEART REMOTE DEVICE CHECK
Battery Voltage: 3 V
Brady Statistic AP VP Percent: 9.58 %
Brady Statistic AS VP Percent: 0.92 %
Brady Statistic AS VS Percent: 1.65 %
Brady Statistic RA Percent Paced: 81.52 %
Brady Statistic RV Percent Paced: 18.95 %
Date Time Interrogation Session: 20200316104958
Implantable Lead Implant Date: 20190107
Implantable Lead Implant Date: 20190107
Implantable Lead Location: 753859
Implantable Lead Location: 753860
Implantable Lead Model: 3830
Implantable Lead Model: 5076
Implantable Pulse Generator Implant Date: 20190107
Lead Channel Impedance Value: 285 Ohm
Lead Channel Impedance Value: 399 Ohm
Lead Channel Pacing Threshold Amplitude: 1.25 V
Lead Channel Pacing Threshold Pulse Width: 0.4 ms
Lead Channel Sensing Intrinsic Amplitude: 1.125 mV
Lead Channel Sensing Intrinsic Amplitude: 1.125 mV
Lead Channel Sensing Intrinsic Amplitude: 1.375 mV
Lead Channel Setting Pacing Amplitude: 2.5 V
Lead Channel Setting Pacing Amplitude: 3 V
Lead Channel Setting Pacing Pulse Width: 1 ms
Lead Channel Setting Sensing Sensitivity: 0.9 mV
MDC IDC MSMT BATTERY REMAINING LONGEVITY: 100 mo
MDC IDC MSMT LEADCHNL RA IMPEDANCE VALUE: 380 Ohm
MDC IDC MSMT LEADCHNL RA IMPEDANCE VALUE: 475 Ohm
MDC IDC MSMT LEADCHNL RV SENSING INTR AMPL: 1.375 mV
MDC IDC STAT BRADY AP VS PERCENT: 88.27 %

## 2019-02-02 ENCOUNTER — Other Ambulatory Visit (HOSPITAL_COMMUNITY): Payer: Self-pay | Admitting: Cardiology

## 2019-02-04 NOTE — Progress Notes (Signed)
Remote pacemaker transmission.   

## 2019-02-21 ENCOUNTER — Other Ambulatory Visit (HOSPITAL_COMMUNITY): Payer: Self-pay | Admitting: Internal Medicine

## 2019-03-18 ENCOUNTER — Encounter (HOSPITAL_COMMUNITY): Payer: Medicare Other | Admitting: Cardiology

## 2019-03-19 ENCOUNTER — Other Ambulatory Visit: Payer: Self-pay

## 2019-03-19 ENCOUNTER — Ambulatory Visit (HOSPITAL_COMMUNITY)
Admission: RE | Admit: 2019-03-19 | Discharge: 2019-03-19 | Disposition: A | Discharge status: Home / Self Care | Payer: Medicare Other | Source: Ambulatory Visit | Attending: Cardiology | Admitting: Cardiology

## 2019-03-19 VITALS — Wt 220.0 lb

## 2019-03-19 DIAGNOSIS — I5022 Chronic systolic (congestive) heart failure: Secondary | ICD-10-CM | POA: Diagnosis not present

## 2019-03-19 DIAGNOSIS — I480 Paroxysmal atrial fibrillation: Secondary | ICD-10-CM

## 2019-03-19 DIAGNOSIS — I495 Sick sinus syndrome: Secondary | ICD-10-CM

## 2019-03-19 DIAGNOSIS — N183 Chronic kidney disease, stage 3 unspecified: Secondary | ICD-10-CM

## 2019-03-19 DIAGNOSIS — I428 Other cardiomyopathies: Secondary | ICD-10-CM

## 2019-03-19 MED ORDER — ISOSORBIDE MONONITRATE ER 30 MG PO TB24: 30.0000 mg | ORAL_TABLET | Freq: Every day | ORAL | 11 refills | Status: DC

## 2019-03-19 MED ORDER — FERROUS SULFATE 325 (65 FE) MG PO TABS: 325.0000 mg | ORAL_TABLET | Freq: Three times a day (TID) | ORAL | 6 refills | Status: DC

## 2019-03-19 MED ORDER — XARELTO 15 MG PO TABS: ORAL_TABLET | ORAL | 11 refills | Status: DC

## 2019-03-19 MED ORDER — PRAVASTATIN SODIUM 80 MG PO TABS: 80.0000 mg | ORAL_TABLET | Freq: Every day | ORAL | 11 refills | Status: DC

## 2019-03-19 NOTE — Patient Instructions (Signed)
Refills for Pravastatin, Imdur, Xarelto and Iron have all been sent into [facility] for you  Please go to your Dr Reather Littler office for labs in 1-2 weeks  We will contact you in 4-6 months to schedule your next appointment.

## 2019-03-19 NOTE — Progress Notes (Signed)
Spoke w/pt via phone, she is aware refills sent in.  Order for labs (cmet, lipids, tsh) faxed to PCP at [phone] she will call them to sch an appt for labs.  AVS mailed to pt per her request.

## 2019-03-19 NOTE — Progress Notes (Signed)
Heart Failure TeleHealth Note  Due to national recommendations of social distancing due to [place] 19, telehealth visit is felt to be most appropriate for this patient at this time.  I discussed the limitations, risks, security and privacy concerns of performing an evaluation and management service by telephone and the availability of in person appointments. I also discussed with the patient that there may be a patient responsible charge related to this service. The patient expressed understanding and agreed to proceed.   ID:  Shannon Lucas, DOB 1931/03/10, MRN [id number]  Location: Home  Provider location: [address], [place] Type of Visit: Established patient   PCP:  Manfred Shirts, PA  Cardiologist:  No primary care provider on file. Primary HF: Dr Aundra Dubin  Chief Complaint: HF follow up   History of Present Illness: Shannon Lucas is a 85 y.o. female with a history of Systolic CHF,  PAF on Xarelto, CKD stage III, DM II, GERD, HLD, CVA, anemia.   Admitted 4/17 -> 02/06/70 with A/C systolic CHF. L/RHC as below with normal coronaries and elevated filling pressures. Diuresed 5 lbs. Hospital course complicated by hematoma at radial cath site that improved overnight. Medications adjusted as tolerated. Discharge weight 205 lbs.    Pt seen in ED 03/08/17 for worsening of hematoma as above.  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 Dr. Donnetta Hutching saw in consult and repaired under local anaesthesia and sedation.   Echo in 10/18 showed EF 35-40% with diffuse hypokinesis, PASP 50 mmHg.  Holter in 10/18 showed runs of atrial fibrillation including afib with aberrancy.  Bradycardia into the 30s was also noted.  Decision was made to place PPM => patient got Medtronic device with His bundle lead and is now His pacing.    Patient presents via audio conferencing for a telehealth visit today. Last seen in HF clinic 12/2018. Echo showed improved EF 60-65%. Overall doing fair other than a fall that occurred  over the weekend. She tripped on rug and hit her head. EMS evaluated her. She has been using walker now. No dizziness. She gets SOB if she "does too much", like chores around the house for a long time. Can walk on flat ground with no problems. No edema or PND. Chronic 2 pillow orthopnea. No CP. Has occasional palpitations. Weight stable 220 lbs. Does not check BP at home. Taking all medications as prescribed. No problems with getting medications.   Medtronic: 19.3% AF on March interrogation  Pt denies symptoms of cough, fevers, chills, or new SOB worrisome for COVID 19.    Past Medical History:  Diagnosis Date  . A-fib ([place])   . Arthritis    "a little bit qwhere" (02/28/2017)  . CHF (congestive heart failure) ([place])   . CKD (chronic kidney disease), stage III ([place])    [name] 02/28/2017  . Depression   . GERD (gastroesophageal reflux disease)   . High cholesterol   . Hypertension   . Melanoma of back ([place])   . Myocardial infarction [facility])    "one dr said I'd had 1" (02/28/2017)  . Stroke [facility]) ~ 2010; ~2012   "affected the right side but I fully recovered; just a light one" (02/28/2017)  . Type II diabetes mellitus ([place])    Past Surgical History:  Procedure Laterality Date  . APPENDECTOMY    . FALSE ANEURYSM REPAIR Right 03/08/2017   Procedure: REPAIR RIGHT RADIAL FALSE ANEURYSM;  Surgeon: Rosetta Posner, MD;  Location: [facility];  Service:  Vascular;  Laterality: Right;  . KNEE SURGERY Left 1970s   "I have 1/3 of my knee left in there"  . LAPAROSCOPIC CHOLECYSTECTOMY    . MELANOMA EXCISION     "back"  . PACEMAKER IMPLANT N/A 11/20/2017   Procedure: PACEMAKER IMPLANT;  Surgeon: Constance Haw, MD;  Location: [facility];  Service: Cardiovascular;  Laterality: N/A;  . RIGHT/LEFT HEART CATH AND CORONARY ANGIOGRAPHY N/A 03/06/2017   Procedure: Right/Left Heart Cath and Coronary Angiography;  Surgeon: Larey Dresser, MD;  Location: [facility];  Service: Cardiovascular;   Laterality: N/A;  . TUBAL LIGATION       Current Outpatient Medications  Medication Sig Dispense Refill  . acetaminophen (TYLENOL) 500 MG tablet Take 1,000 mg by mouth every 6 (six) hours as needed for moderate pain or headache.    Marland Kitchen amiodarone (PACERONE) 200 MG tablet TAKE 1 TABLET BY MOUTH DAILY 30 tablet 3  . benzonatate (TESSALON) 100 MG capsule Take 100 mg by mouth 3 (three) times daily as needed for cough.    . ergocalciferol (VITAMIN D2) 50000 units capsule Take 50,000 Units by mouth every Friday. At night    . ferrous sulfate 325 (65 FE) MG tablet Take 1 tablet (325 mg total) by mouth 3 (three) times daily with meals. 90 tablet 0  . furosemide (LASIX) 40 MG tablet Take 0.5 tablets (20 mg total) by mouth daily. 15 tablet 3  . ipratropium (ATROVENT HFA) 17 MCG/ACT inhaler Inhale 2 puffs into the lungs every 6 (six) hours as needed for wheezing. 1 Inhaler 12  . isosorbide mononitrate (IMDUR) 30 MG 24 hr tablet TAKE 1 TABLET BY MOUTH ONCE DAILY    . LANTUS SOLOSTAR 100 UNIT/ML Solostar Pen Inject 50 Units into the skin daily.   3  . metoprolol succinate (TOPROL-XL) 100 MG 24 hr tablet TAKE 1 TABLET BY MOUTH DAILY TAKE WITH OR IMMDIATELY FOLLOWING A MEAL 90 tablet 3  . pravastatin (PRAVACHOL) 80 MG tablet Take 1 tablet (80 mg total) by mouth at bedtime. 60 tablet 3  . sacubitril-valsartan (ENTRESTO) 49-51 MG Take 1 tablet by mouth 2 (two) times daily. 60 tablet 3  . spironolactone (ALDACTONE) 25 MG tablet TAKE 1/2 TABLET BY MOUTH DAILY 15 tablet 6  . venlafaxine (EFFEXOR) 75 MG tablet Take 75 mg by mouth daily.     [name] 15 MG TABS tablet TAKE 1 TABLET BY MOUTH DAILY WITH SUPPER 30 tablet 6   No current facility-administered medications for this encounter.     Allergies:   Patient has no known allergies.   Social History:  The patient  reports that she has quit smoking. She has never used smokeless tobacco. She reports that she does not drink alcohol or use drugs.   Family  History:  The patient's family history includes Diabetes in her mother; Parkinson's disease in her brother; Stroke in her father.   ROS:  Please see the history of present illness.   All other systems are personally reviewed and negative.    Exam:  (Video/Tele Health Call; Exam is subjective and or/visual.) General:  Speaks in full sentences. No resp difficulty. Lungs: Normal respiratory effort with conversation.  Abdomen: No distension per patient report Extremities: Pt denies edema. Neuro: Alert & oriented x 3.   Recent Labs: 09/17/2018: TSH 4.000 12/04/2018: ALT 13; Hemoglobin 11.5; Platelets 193 01/01/2019: BUN 41; Creatinine, Ser 1.87; Potassium 4.2; Sodium 139  Personally reviewed   Wt Readings from Last 3  Encounters:  03/19/19 99.8 kg (220 lb)  12/18/18 99.8 kg (220 lb)  12/04/18 100.7 kg (222 lb)      ASSESSMENT AND PLAN:  1. Chronic systolic HF:  Echo with EF 25-30% 02/2017, repeat echo 10/18 showed EF 35-40%. Nonischemic cardiomyopathy. Medtronic device with His bundle lead.   - NYHA II-III. Volume status stable. Weight unchanged.  - Echo 12/2018: EF improved to 60-65%.  - Continue Lasix 20 mg daily. Check BMET - Continue Entresto 49/51 mg BID.  - Continue Toprol XL 100 mg daily.  - Continue spironolactone 12.5 mg daily.  - Reinforced fluid restriction to < 2 L daily, sodium restriction to less than 2000 mg daily, and the importance of daily weights.   2. CKD: Stage III - Check BMET 3. PAF/atrial flutter:  - Continue amiodarone 200 mg daily. She will need regular eye exams.  Check TSH and LFTs. - Continue Xarelto.  - Follows with Dr Curt Bears.  4. CAD: Nonobstructive on 4/18 cath.   - No s/s ischemia. - LDL 105 in 7/19. Continue pravastatin 80 mg daily. Check lipids and LFTs.  - No ASA given stable CAD and Xarelto use.  5. Bradycardia:  - She is s/p Medtronic PPM with His bundle pacing.  6. HTN:  - Manage with HF meds. Discussed that goal BP <130/80.   COVID  screen The patient does not have any symptoms that suggest any further testing/ screening at this time.  Social distancing reinforced today.  Patient Risk: After full review of this patients clinical status, I feel that they are at moderate risk for cardiac decompensation at this time.  Orders/Follow up: Check lipids, CMET, and TSH at Dr Reather Littler office. Refill pravastatin, imdur, and Xarelto to [facility]. Follow up 4-6 months with Dr Aundra Dubin.  Today, I have spent 23 minutes with the patient with telehealth technology discussing the above issues.    Signed, Georgiana Shore, NP  03/19/2019 10:44 AM   [facility] [address] Heart and [place] [address] [phone] (office) [phone] (fax)

## 2019-03-19 NOTE — Addendum Note (Signed)
Encounter addended by: Scarlette Calico, RN on: 03/19/2019 12:32 PM  Actions taken: Order list changed, Clinical Note Signed

## 2019-03-21 ENCOUNTER — Telehealth (HOSPITAL_COMMUNITY): Payer: Self-pay

## 2019-03-21 ENCOUNTER — Emergency Department (HOSPITAL_COMMUNITY)
Admission: EM | Admit: 2019-03-21 | Discharge: 2019-03-21 | Disposition: A | Discharge status: Home / Self Care | Payer: Medicare Other | Attending: Emergency Medicine | Admitting: Emergency Medicine

## 2019-03-21 ENCOUNTER — Emergency Department (HOSPITAL_COMMUNITY): Payer: Medicare Other

## 2019-03-21 ENCOUNTER — Other Ambulatory Visit: Payer: Self-pay

## 2019-03-21 ENCOUNTER — Encounter (HOSPITAL_COMMUNITY): Payer: Self-pay | Admitting: Emergency Medicine

## 2019-03-21 DIAGNOSIS — Y9301 Activity, walking, marching and hiking: Secondary | ICD-10-CM | POA: Insufficient documentation

## 2019-03-21 DIAGNOSIS — Z79899 Other long term (current) drug therapy: Secondary | ICD-10-CM | POA: Diagnosis not present

## 2019-03-21 DIAGNOSIS — N183 Chronic kidney disease, stage 3 (moderate): Secondary | ICD-10-CM | POA: Diagnosis not present

## 2019-03-21 DIAGNOSIS — Z7901 Long term (current) use of anticoagulants: Secondary | ICD-10-CM | POA: Insufficient documentation

## 2019-03-21 DIAGNOSIS — E1122 Type 2 diabetes mellitus with diabetic chronic kidney disease: Secondary | ICD-10-CM | POA: Insufficient documentation

## 2019-03-21 DIAGNOSIS — I130 Hypertensive heart and chronic kidney disease with heart failure and stage 1 through stage 4 chronic kidney disease, or unspecified chronic kidney disease: Secondary | ICD-10-CM | POA: Insufficient documentation

## 2019-03-21 DIAGNOSIS — S0003XA Contusion of scalp, initial encounter: Secondary | ICD-10-CM

## 2019-03-21 DIAGNOSIS — Y929 Unspecified place or not applicable: Secondary | ICD-10-CM | POA: Insufficient documentation

## 2019-03-21 DIAGNOSIS — Z794 Long term (current) use of insulin: Secondary | ICD-10-CM | POA: Insufficient documentation

## 2019-03-21 DIAGNOSIS — I5022 Chronic systolic (congestive) heart failure: Secondary | ICD-10-CM | POA: Diagnosis not present

## 2019-03-21 DIAGNOSIS — Y999 Unspecified external cause status: Secondary | ICD-10-CM | POA: Diagnosis not present

## 2019-03-21 DIAGNOSIS — W01190A Fall on same level from slipping, tripping and stumbling with subsequent striking against furniture, initial encounter: Secondary | ICD-10-CM | POA: Insufficient documentation

## 2019-03-21 DIAGNOSIS — W19XXXA Unspecified fall, initial encounter: Secondary | ICD-10-CM

## 2019-03-21 DIAGNOSIS — I4891 Unspecified atrial fibrillation: Secondary | ICD-10-CM | POA: Diagnosis not present

## 2019-03-21 LAB — URINALYSIS, ROUTINE W REFLEX MICROSCOPIC
Bilirubin Urine: NEGATIVE
Glucose, UA: NEGATIVE mg/dL
Ketones, ur: NEGATIVE mg/dL
Leukocytes,Ua: NEGATIVE
Nitrite: NEGATIVE
Protein, ur: 30 mg/dL — AB
Specific Gravity, Urine: 1.015 (ref 1.005–1.030)
pH: 5 (ref 5.0–8.0)

## 2019-03-21 MED ORDER — ACETAMINOPHEN 325 MG PO TABS
650.0000 mg | ORAL_TABLET | Freq: Once | ORAL | Status: AC
  Administered 2019-03-21: 650 mg via ORAL
  Filled 2019-03-21: qty 2

## 2019-03-21 NOTE — ED Notes (Signed)
Nurse Navigator called pt daughter to update her.  Advised her that the urine was sent and I will let her know when its back.  Pt is resting comfortably, states that she is hungry, will check with primary RN

## 2019-03-21 NOTE — ED Notes (Signed)
Verbal order CT head from [place], [place].

## 2019-03-21 NOTE — ED Provider Notes (Signed)
[facility] EMERGENCY DEPARTMENT Provider Note   CSN: [id number] Arrival date & time: 03/21/19  1457    History   Chief Complaint Chief Complaint  Patient presents with  . Fall    HPI Shannon Lucas is a 85 y.o. female with a PMHx of afib, CHF, CKD, HTN who presents to the ED after a fall 5 days ago.  Patient states 5 days ago she was walking when she tripped on the rug and fell hitting the left side of her head on a coffee table.  No LOC remembers the entire event.  Denies any lightheadedness, dizziness, pain, shortness of breath prior to falling.  States she normally walks with a walker but was not using it at this time.  Endorses a minor headache but denies any other symptoms. HPI  Past Medical History:  Diagnosis Date  . A-fib ([place])   . Arthritis    "a little bit qwhere" (02/28/2017)  . CHF (congestive heart failure) ([place])   . CKD (chronic kidney disease), stage III ([place])    [name] 02/28/2017  . Depression   . GERD (gastroesophageal reflux disease)   . High cholesterol   . Hypertension   . Melanoma of back ([place])   . Myocardial infarction [facility])    "one dr said I'd had 1" (02/28/2017)  . Stroke [facility]) ~ 2010; ~2012   "affected the right side but I fully recovered; just a light one" (02/28/2017)  . Type II diabetes mellitus [facility])     Patient Active Problem List   Diagnosis Date Noted  . Tachy-brady syndrome ([place]) 11/20/2017  . Chronic systolic heart failure ([place]) 03/15/2017  . Pseudoaneurysm following procedure ([place])   . Acute systolic congestive heart failure ([place])   . AKI (acute kidney injury) ([place])   . CHF exacerbation ([place]) 03/01/2017  . Fall   . Anemia   . CKD (chronic kidney disease) stage 3, GFR 30-59 ml/min (H[facility])   . Diabetes mellitus type II, controlled ([place])   . Paroxysmal atrial fibrillation ([facility])   . Chronic anticoagulation   . Pure hypercholesterolemia   . Shortness of breath 02/28/2017    Past Surgical History:  Procedure Laterality  Date  . APPENDECTOMY    . FALSE ANEURYSM REPAIR Right 03/08/2017   Procedure: REPAIR RIGHT RADIAL FALSE ANEURYSM;  Surgeon: Rosetta Posner, MD;  Location: [facility];  Service: Vascular;  Laterality: Right;  . KNEE SURGERY Left 1970s   "I have 1/3 of my knee left in there"  . LAPAROSCOPIC CHOLECYSTECTOMY    . MELANOMA EXCISION     "back"  . PACEMAKER IMPLANT N/A 11/20/2017   Procedure: PACEMAKER IMPLANT;  Surgeon: Constance Haw, MD;  Location: [facility];  Service: Cardiovascular;  Laterality: N/A;  . RIGHT/LEFT HEART CATH AND CORONARY ANGIOGRAPHY N/A 03/06/2017   Procedure: Right/Left Heart Cath and Coronary Angiography;  Surgeon: Larey Dresser, MD;  Location: [facility];  Service: Cardiovascular;  Laterality: N/A;  . TUBAL LIGATION       OB History   No obstetric history on file.      Home Medications    Prior to Admission medications   Medication Sig Start Date End Date Taking? Authorizing Provider  acetaminophen (TYLENOL) 500 MG tablet Take 1,000 mg by mouth every 6 (six) hours as needed for moderate pain or headache.    [provider]  amiodarone (PACERONE) 200 MG tablet TAKE 1 TABLET BY MOUTH DAILY 02/21/19   Bensimhon, Quillian Quince  R, MD  benzonatate (TESSALON) 100 MG capsule Take 100 mg by mouth 3 (three) times daily as needed for cough.    [provider]  ergocalciferol (VITAMIN D2) 50000 units capsule Take 50,000 Units by mouth every Friday. At night    [provider]  ferrous sulfate 325 (65 FE) MG tablet Take 1 tablet (325 mg total) by mouth 3 (three) times daily with meals. 03/19/19   Larey Dresser, MD  furosemide (LASIX) 40 MG tablet Take 0.5 tablets (20 mg total) by mouth daily. 01/15/19   Larey Dresser, MD  ipratropium (ATROVENT HFA) 17 MCG/ACT inhaler Inhale 2 puffs into the lungs every 6 (six) hours as needed for wheezing. 09/06/17   Larey Dresser, MD  isosorbide mononitrate (IMDUR) 30 MG 24 hr tablet Take 1 tablet (30 mg  total) by mouth daily. 03/19/19   Larey Dresser, MD  LANTUS SOLOSTAR 100 UNIT/ML Solostar Pen Inject 50 Units into the skin daily.  02/24/17   [provider]  metoprolol succinate (TOPROL-XL) 100 MG 24 hr tablet TAKE 1 TABLET BY MOUTH DAILY TAKE WITH OR IMMDIATELY FOLLOWING A MEAL 02/04/19   Larey Dresser, MD  pravastatin (PRAVACHOL) 80 MG tablet Take 1 tablet (80 mg total) by mouth at bedtime. 03/19/19   Larey Dresser, MD  sacubitril-valsartan (ENTRESTO) 49-51 MG Take 1 tablet by mouth 2 (two) times daily. 09/17/18   Larey Dresser, MD  spironolactone (ALDACTONE) 25 MG tablet TAKE 1/2 TABLET BY MOUTH DAILY 01/25/19   Larey Dresser, MD  venlafaxine [facility]) 75 MG tablet Take 75 mg by mouth daily.  02/03/17   [provider]  XARELTO 15 MG TABS tablet TAKE 1 TABLET BY MOUTH DAILY WITH SUPPER 03/19/19   Larey Dresser, MD    Family History Family History  Problem Relation Age of Onset  . Diabetes Mother   . Stroke Father   . Parkinson's disease Brother     Social History Social History   Tobacco Use  . Smoking status: Former Research scientist (life sciences)  . Smokeless tobacco: Never Used  . Tobacco comment: 02/28/2017 "only smoked in the 1960s when we went out"  Substance Use Topics  . Alcohol use: No  . Drug use: No     Allergies   Patient has no known allergies.   Review of Systems Review of Systems  Constitutional: Negative for activity change, appetite change, fatigue and fever.  HENT: Negative for congestion.   Respiratory: Negative for chest tightness and shortness of breath.   Cardiovascular: Negative for chest pain.  Gastrointestinal: Negative for abdominal pain, diarrhea, nausea and vomiting.  Genitourinary: Negative for difficulty urinating and dysuria.  Musculoskeletal: Negative for back pain and neck pain.  Skin: Negative for rash and wound.  Neurological: Positive for headaches. Negative for dizziness, facial asymmetry, speech difficulty and weakness.   Psychiatric/Behavioral: Positive for confusion.  All other systems reviewed and are negative.    Physical Exam Updated Vital Signs BP 133/60   Pulse 89   Temp 98.2 F (36.8 C) (Oral)   Resp 16   SpO2 95%   Physical Exam Vitals signs and nursing note reviewed.  Constitutional:      General: She is not in acute distress.    Appearance: She is well-developed. She is not ill-appearing.  HENT:     Head: Normocephalic.     Comments: Contusion and scab to left parietal scalp Eyes:     Conjunctiva/sclera: Conjunctivae normal.  Neck:  Musculoskeletal: Neck supple.  Cardiovascular:     Rate and Rhythm: Normal rate and regular rhythm.     Heart sounds: No murmur.  Pulmonary:     Effort: Pulmonary effort is normal. No respiratory distress.     Breath sounds: Normal breath sounds.  Abdominal:     Palpations: Abdomen is soft.     Tenderness: There is no abdominal tenderness. There is no guarding.  Skin:    General: Skin is warm and dry.  Neurological:     General: No focal deficit present.     Mental Status: She is alert and oriented to person, place, and time.     Cranial Nerves: No cranial nerve deficit.     Sensory: No sensory deficit.     Motor: No weakness.     Coordination: Coordination normal.  Psychiatric:        Mood and Affect: Mood normal.      ED Treatments / Results  Labs (all labs ordered are listed, but only abnormal results are displayed) Labs Reviewed  URINALYSIS, ROUTINE W REFLEX MICROSCOPIC - Abnormal; Notable for the following components:      Result Value   Hgb urine dipstick SMALL (*)    Protein, ur 30 (*)    Bacteria, UA RARE (*)    All other components within normal limits    EKG None  Radiology Ct Head Wo Contrast  Result Date: 03/21/2019 CLINICAL DATA:  Pt reports mechanical fall on Sunday and another on Monday. States she hit her head, denies LOC, EXAM: CT HEAD WITHOUT CONTRAST TECHNIQUE: Contiguous axial images were obtained from  the base of the skull through the vertex without intravenous contrast. COMPARISON:  None. FINDINGS: Brain: No evidence of acute infarction, hemorrhage, extra-axial collection, ventriculomegaly, or mass effect. Generalized cerebral atrophy. Periventricular white matter low attenuation likely secondary to microangiopathy. Vascular: Cerebrovascular atherosclerotic calcifications are noted. Skull: Negative for fracture or focal lesion. Sinuses/Orbits: Visualized portions of the orbits are unremarkable. Visualized portions of the paranasal sinuses and mastoid air cells are unremarkable. Other: Left parietal scalp hematoma. IMPRESSION: No acute intracranial pathology. Electronically Signed   By: Kathreen Devoid   On: 03/21/2019 17:12    Procedures Procedures (including critical care time)  Medications Ordered in ED Medications  acetaminophen (TYLENOL) tablet 650 mg (650 mg Oral Given 03/21/19 1904)     Initial Impression / Assessment and Plan / ED Course  I have reviewed the triage vital signs and the nursing notes.  Pertinent labs & imaging results that were available during my care of the patient were reviewed by me and considered in my medical decision making (see chart for details).        Shannon Lucas is a 85 y.o. female with a PMHx of afib, CHF, CKD, HTN who presents to the ED after a fall 5 days ago.  Patient states 5 days ago she was walking when she tripped on the rug and fell hitting the left side of her head on a coffee table.  No LOC remembers the entire event.  Denies any lightheadedness, dizziness, pain, shortness of breath prior to falling.  States she normally walks with a walker but was not using it at this time.  Endorses a minor headache but denies any other symptoms.    On initial exam patient is  well appearing, not in acute distress. VSS. physical exam as above shows small hematoma with overlying scab to the left parietal scalp region.  Patient is otherwise alert and  oriented x3  with a nonfocal neuro exam.  ED interpretation of Imaging: CT head negative for acute intracranial pathology.   Called and spoke with daughter who is concerned that patient might have a UTI.  UA negative for infection.  Do not feel further work-up is required as fall was mechanical in nature.  Discussed results with daughter and patient.  At this time patient stable for discharge home.    Medications given in ED: tylenol    Discharge instructions: I explained the diagnosis and have given explicit precautions to return to the ER including other new or worsening symptoms. Patient is in understanding of plan. All questions answered at this time.  Discharge instructions concerning home care and prescriptions have been given.  The patient is STABLE and is discharged to home in good condition.     Final Clinical Impressions(s) / ED Diagnoses   Final diagnoses:  Fall, initial encounter  Contusion of scalp, initial encounter    ED Discharge Orders    None       Doneta Public, MD 03/21/19 2109    Veryl Speak, MD 03/22/19 [phone]

## 2019-03-21 NOTE — Telephone Encounter (Signed)
Agree, will probably need head CT

## 2019-03-21 NOTE — Telephone Encounter (Signed)
Pts daughter called stating that patient fell over the wknd.  Patient hit her head and EMS was called however they did not take her to emergency room. She now reports that patient is experiencing pain in her head, neck and arm.  Pt fully conversant with her.  Pt on blood thinners as noted in chart. Advised patient should be evaluated in ED because she hit her head and is on blood thinners.  She verbalized understanding.

## 2019-03-21 NOTE — ED Notes (Signed)
This RN spoke to the pts daughter previously in lobby while waiting for a room and explained the process to the daughter and that the pt would have a CT scan and then be placed in a room to be evaluated by a doctor, when a room was ready the EMT and this RN called for the pt and she was gone, this RN called the daughter back to see if she chose to leave because the room was ready and the daughter stated that the the pt returned to the lobby from CT and told her she was discharged, this RN informed the daughter that the pt had not been seen by a MD, and recommended that the pt return for eval, the pts name will remain in the lobby until she returns and a room is available

## 2019-03-21 NOTE — ED Triage Notes (Signed)
Pt reports mechanical fall on Sunday and another on Monday. States she hit her head, denies LOC,  Pt A&O x 4, no weakness noted. Currently on xarelto.

## 2019-03-31 ENCOUNTER — Telehealth: Payer: Self-pay | Admitting: Cardiology

## 2019-03-31 ENCOUNTER — Encounter (HOSPITAL_COMMUNITY): Payer: Self-pay

## 2019-03-31 ENCOUNTER — Emergency Department (HOSPITAL_COMMUNITY)
Admission: EM | Admit: 2019-03-31 | Discharge: 2019-03-31 | Disposition: A | Discharge status: Home / Self Care | Payer: Medicare Other | Attending: Emergency Medicine | Admitting: Emergency Medicine

## 2019-03-31 ENCOUNTER — Other Ambulatory Visit: Payer: Self-pay

## 2019-03-31 DIAGNOSIS — W1809XA Striking against other object with subsequent fall, initial encounter: Secondary | ICD-10-CM | POA: Diagnosis not present

## 2019-03-31 DIAGNOSIS — Y999 Unspecified external cause status: Secondary | ICD-10-CM | POA: Diagnosis not present

## 2019-03-31 DIAGNOSIS — E119 Type 2 diabetes mellitus without complications: Secondary | ICD-10-CM | POA: Insufficient documentation

## 2019-03-31 DIAGNOSIS — Z87891 Personal history of nicotine dependence: Secondary | ICD-10-CM | POA: Insufficient documentation

## 2019-03-31 DIAGNOSIS — Y939 Activity, unspecified: Secondary | ICD-10-CM | POA: Insufficient documentation

## 2019-03-31 DIAGNOSIS — I10 Essential (primary) hypertension: Secondary | ICD-10-CM | POA: Diagnosis not present

## 2019-03-31 DIAGNOSIS — Y929 Unspecified place or not applicable: Secondary | ICD-10-CM | POA: Diagnosis not present

## 2019-03-31 DIAGNOSIS — S0101XD Laceration without foreign body of scalp, subsequent encounter: Secondary | ICD-10-CM

## 2019-03-31 DIAGNOSIS — Z79899 Other long term (current) drug therapy: Secondary | ICD-10-CM | POA: Diagnosis not present

## 2019-03-31 DIAGNOSIS — S0101XA Laceration without foreign body of scalp, initial encounter: Secondary | ICD-10-CM | POA: Diagnosis present

## 2019-03-31 MED ORDER — LIDOCAINE-EPINEPHRINE (PF) 2 %-1:200000 IJ SOLN
10.0000 mL | Freq: Once | INTRAMUSCULAR | Status: AC
  Administered 2019-03-31: 10 mL
  Filled 2019-03-31: qty 20

## 2019-03-31 NOTE — ED Triage Notes (Signed)
Pt BIB [facility] for eval of head wound sustained on 5/3. Pt is currently anticoagulated on xarelto. Was eval'd at time of initial fall, w/ negative workup. Last night, wound on head started to bleed again. Pt denies new trauma, HA, N/V.

## 2019-03-31 NOTE — ED Provider Notes (Signed)
[place] EMERGENCY DEPARTMENT Provider Note   CSN: [id number] Arrival date & time: 03/31/19  1644    History   Chief Complaint Chief Complaint  Patient presents with  . Head Laceration    HPI Shannon Lucas is a 85 y.o. female.  She is brought in by ambulance for evaluation of a wound on her scalp.  She had a fall on May 3 where she struck her head.  She is on Xarelto.  She was evaluated in the ED on the seventh for pain and had a CT that was negative.  She started bleeding from her wound last evening and it was worse today.  Some bystanders at the house applied some clotting material to it and wrapped her in a towel.  She is continued to have some bleeding and pain at the site.  No known fever.  No new trauma.  She is taken Tylenol for pain which is helped somewhat.     The history is provided by the patient.  Head Laceration  This is a new problem. The current episode started more than 1 week ago. The problem has been gradually worsening. Associated symptoms include headaches. Pertinent negatives include no chest pain, no abdominal pain and no shortness of breath. Exacerbated by: palating. The symptoms are relieved by rest. She has tried rest for the symptoms. The treatment provided mild relief.    Past Medical History:  Diagnosis Date  . A-fib ([place])   . Arthritis    "a little bit qwhere" (02/28/2017)  . CHF (congestive heart failure) ([place])   . CKD (chronic kidney disease), stage III ([place])    [name] 02/28/2017  . Depression   . GERD (gastroesophageal reflux disease)   . High cholesterol   . Hypertension   . Melanoma of back ([place])   . Myocardial infarction [facility])    "one dr said I'd had 1" (02/28/2017)  . Stroke [facility]) ~ 2010; ~2012   "affected the right side but I fully recovered; just a light one" (02/28/2017)  . Type II diabetes mellitus [facility])     Patient Active Problem List   Diagnosis Date Noted  . Tachy-brady syndrome ([place]) 11/20/2017  . Chronic  systolic heart failure ([place]) 03/15/2017  . Pseudoaneurysm following procedure ([place])   . Acute systolic congestive heart failure ([place])   . AKI (acute kidney injury) ([place])   . CHF exacerbation ([place]) 03/01/2017  . Fall   . Anemia   . CKD (chronic kidney disease) stage 3, GFR 30-59 ml/min (H[facility])   . Diabetes mellitus type II, controlled ([place])   . Paroxysmal atrial fibrillation ([facility])   . Chronic anticoagulation   . Pure hypercholesterolemia   . Shortness of breath 02/28/2017    Past Surgical History:  Procedure Laterality Date  . APPENDECTOMY    . FALSE ANEURYSM REPAIR Right 03/08/2017   Procedure: REPAIR RIGHT RADIAL FALSE ANEURYSM;  Surgeon: Rosetta Posner, MD;  Location: [place];  Service: Vascular;  Laterality: Right;  . KNEE SURGERY Left 1970s   "I have 1/3 of my knee left in there"  . LAPAROSCOPIC CHOLECYSTECTOMY    . MELANOMA EXCISION     "back"  . PACEMAKER IMPLANT N/A 11/20/2017   Procedure: PACEMAKER IMPLANT;  Surgeon: Constance Haw, MD;  Location: [place] CV LAB;  Service: Cardiovascular;  Laterality: N/A;  . RIGHT/LEFT HEART CATH AND CORONARY ANGIOGRAPHY N/A 03/06/2017   Procedure: Right/Left Heart Cath and Coronary Angiography;  Surgeon: Larey Dresser, MD;  Location: [place] CV LAB;  Service: Cardiovascular;  Laterality: N/A;  . TUBAL LIGATION       OB History   No obstetric history on file.      Home Medications    Prior to Admission medications   Medication Sig Start Date End Date Taking? Authorizing Provider  acetaminophen (TYLENOL) 500 MG tablet Take 1,000 mg by mouth every 6 (six) hours as needed for moderate pain or headache.    [provider]  amiodarone (PACERONE) 200 MG tablet TAKE 1 TABLET BY MOUTH DAILY 02/21/19   Bensimhon, Shaune Pascal, MD  benzonatate (TESSALON) 100 MG capsule Take 100 mg by mouth 3 (three) times daily as needed for cough.    [provider]  ergocalciferol (VITAMIN D2) 50000 units capsule Take 50,000 Units by  mouth every Friday. At night    [provider]  ferrous sulfate 325 (65 FE) MG tablet Take 1 tablet (325 mg total) by mouth 3 (three) times daily with meals. 03/19/19   Larey Dresser, MD  furosemide (LASIX) 40 MG tablet Take 0.5 tablets (20 mg total) by mouth daily. 01/15/19   Larey Dresser, MD  ipratropium (ATROVENT HFA) 17 MCG/ACT inhaler Inhale 2 puffs into the lungs every 6 (six) hours as needed for wheezing. 09/06/17   Larey Dresser, MD  isosorbide mononitrate (IMDUR) 30 MG 24 hr tablet Take 1 tablet (30 mg total) by mouth daily. 03/19/19   Larey Dresser, MD  LANTUS SOLOSTAR 100 UNIT/ML Solostar Pen Inject 50 Units into the skin daily.  02/24/17   [provider]  metoprolol succinate (TOPROL-XL) 100 MG 24 hr tablet TAKE 1 TABLET BY MOUTH DAILY TAKE WITH OR IMMDIATELY FOLLOWING A MEAL 02/04/19   Larey Dresser, MD  pravastatin (PRAVACHOL) 80 MG tablet Take 1 tablet (80 mg total) by mouth at bedtime. 03/19/19   Larey Dresser, MD  sacubitril-valsartan (ENTRESTO) 49-51 MG Take 1 tablet by mouth 2 (two) times daily. 09/17/18   Larey Dresser, MD  spironolactone (ALDACTONE) 25 MG tablet TAKE 1/2 TABLET BY MOUTH DAILY 01/25/19   Larey Dresser, MD  venlafaxine [facility]) 75 MG tablet Take 75 mg by mouth daily.  02/03/17   [provider]  XARELTO 15 MG TABS tablet TAKE 1 TABLET BY MOUTH DAILY WITH SUPPER 03/19/19   Larey Dresser, MD    Family History Family History  Problem Relation Age of Onset  . Diabetes Mother   . Stroke Father   . Parkinson's disease Brother     Social History Social History   Tobacco Use  . Smoking status: Former Research scientist (life sciences)  . Smokeless tobacco: Never Used  . Tobacco comment: 02/28/2017 "only smoked in the 1960s when we went out"  Substance Use Topics  . Alcohol use: No  . Drug use: No     Allergies   Patient has no known allergies.   Review of Systems Review of Systems  Constitutional: Negative for fever.  HENT: Negative  for sore throat.   Eyes: Negative for visual disturbance.  Respiratory: Negative for shortness of breath.   Cardiovascular: Negative for chest pain.  Gastrointestinal: Negative for abdominal pain.  Genitourinary: Negative for dysuria.  Musculoskeletal: Negative for neck pain.  Skin: Positive for wound. Negative for rash.  Neurological: Positive for headaches.     Physical Exam Updated Vital Signs BP 139/70 (BP Location: Right Arm)   Pulse 73   Temp (!) 97.2 F (36.2 C) (Oral)  Resp 18   Ht 5\' 5"  (1.651 m)   Wt 101.6 kg   SpO2 100%   BMI 37.28 kg/m   Physical Exam Constitutional:      Appearance: She is well-developed.  HENT:     Head: Normocephalic.     Comments: She has some oozing from the left parietal area with a lot of clotted blood and clotting material matted into her hair.    Mouth/Throat:     Comments: She has multiple sores and scabs on her lips that she calls fever blisters.  Says she gets these intermittently. Eyes:     Conjunctiva/sclera: Conjunctivae normal.  Neck:     Musculoskeletal: Neck supple.  Cardiovascular:     Rate and Rhythm: Normal rate.     Pulses: Normal pulses.  Pulmonary:     Effort: Pulmonary effort is normal.     Breath sounds: No wheezing or rhonchi.  Abdominal:     Tenderness: There is no abdominal tenderness. There is no rebound.  Skin:    General: Skin is warm and dry.  Neurological:     General: No focal deficit present.     Mental Status: She is alert. Mental status is at baseline.     GCS: GCS eye subscore is 4. GCS verbal subscore is 5. GCS motor subscore is 6.      ED Treatments / Results  Labs (all labs ordered are listed, but only abnormal results are displayed) Labs Reviewed - No data to display  EKG None  Radiology No results found.  Procedures .Marland KitchenLaceration Repair Date/Time: 03/31/2019 5:33 PM Performed by: Hayden Rasmussen, MD Authorized by: Hayden Rasmussen, MD   Consent:    Consent obtained:   Verbal   Consent given by:  Patient   Risks discussed:  Infection, pain, poor cosmetic result, poor wound healing and retained foreign body   Alternatives discussed:  No treatment and delayed treatment Anesthesia (see MAR for exact dosages):    Anesthesia method:  Local infiltration   Local anesthetic:  Lidocaine 2% WITH epi Laceration details:    Location:  Scalp   Scalp location:  L parietal   Length (cm):  3 Repair type:    Repair type:  Simple Pre-procedure details:    Preparation:  Patient was prepped and draped in usual sterile fashion Exploration:    Contaminated: no   Treatment:    Area cleansed with:  Saline   Amount of cleaning:  Standard   Irrigation solution:  Sterile saline Skin repair:    Repair method:  Sutures   Suture size:  3-0   Suture material:  Prolene   Suture technique:  Vertical mattress   Number of sutures:  2 Approximation:    Approximation:  Close Post-procedure details:    Dressing:  Antibiotic ointment and bulky dressing   Patient tolerance of procedure:  Tolerated well, no immediate complications   (including critical care time)  Medications Ordered in ED Medications - No data to display   Initial Impression / Assessment and Plan / ED Course  I have reviewed the triage vital signs and the nursing notes.  Pertinent labs & imaging results that were available during my care of the patient were reviewed by me and considered in my medical decision making (see chart for details).  Clinical Course as of Mar 31 1827  Sun Mar 31, 2019  1718 With a lot of work by the nurse she was able to get all the debris out of  her hair down so I could visualize the wound.  She is approximately 3 cm laceration that is partially healed but still oozing.  I think it would benefit from a few mattress sutures in place to try to get her some better hemostasis.   [MB]  6720 Nurse was able to reach out to family and updated them on plan for likely discharge after period  of observation to make sure she is achieved hemostasis.   [MB]    Clinical Course User Index [MB] Hayden Rasmussen, MD        Final Clinical Impressions(s) / ED Diagnoses   Final diagnoses:  Laceration of scalp, subsequent encounter    ED Discharge Orders    None       Hayden Rasmussen, MD 03/31/19 [phone]

## 2019-03-31 NOTE — ED Notes (Signed)
Pt given shampoo and head cleansing before DC. Hair combed and matted blood removed. Dsg applied per order and pt voices understanding for suture removal. Denies other needs at this time. Daughter updated during visit x 2

## 2019-03-31 NOTE — Discharge Instructions (Signed)
You were seen in the emergency department for continued bleeding from a wound on your scalp from a fall.  We cleaned away most of the debris that was used to try to stop the bleeding and put into sutures.  These will need to be removed in about a week.  If the area rebleeds please hold some direct pressure to it.  If it does not stop please return to the emergency department.

## 2019-03-31 NOTE — ED Notes (Signed)
On arrival, pts hair is completely matted and solidified w/ blood. Approx 40 minutes spent detangling hair and visualizing source of bleeding. MD Melina Copa able to visual lac w/ plans to repair w/ sutures.

## 2019-03-31 NOTE — Telephone Encounter (Signed)
Patient fell about 2 weeks ago and had a contusion and had a Head CT a week ago that was normal.  Now her head is bleeding profusely and is on her way back to the ER for evaluation. Her daughter wanted me to know.  She has been on Xarelto for PAF and has been maintaining NSR on Pacer checks.  Ok to hold Xarelto for a few days if needed until bleeding issues resolve.

## 2019-04-04 ENCOUNTER — Other Ambulatory Visit (HOSPITAL_COMMUNITY): Payer: Self-pay | Admitting: Cardiology

## 2019-04-24 IMAGING — CT CT ABD-PELV W/O
2 of 4 series · 16 of 46 positions shown, 18 images · non-contrast
Comparison: Renal ultrasound dated 07/09/2014

CLINICAL DATA: 87-year-old female with left-sided abdominal pain x3
weeks.

EXAM:
CT ABDOMEN AND PELVIS WITHOUT CONTRAST
TECHNIQUE: Multidetector CT imaging of the abdomen and pelvis was performed
following the standard protocol without IV contrast.

[Series 3: ap without · axial · non-contrast · 0.91mm/px · z∈[+696,+1112]mm · 13 of 95 slices shown, 15 images]
[im 6/95  soft-tissue]
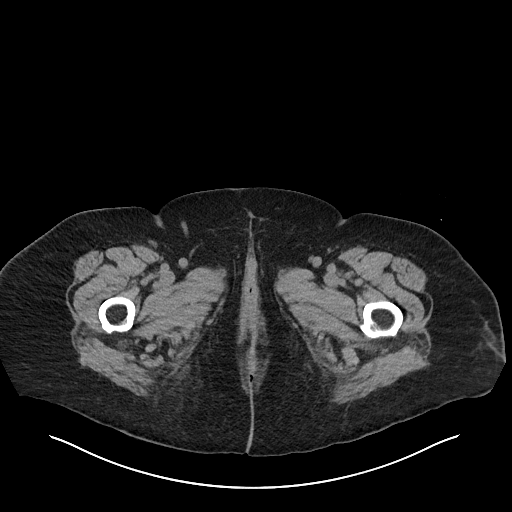
[im 6/95  bone]
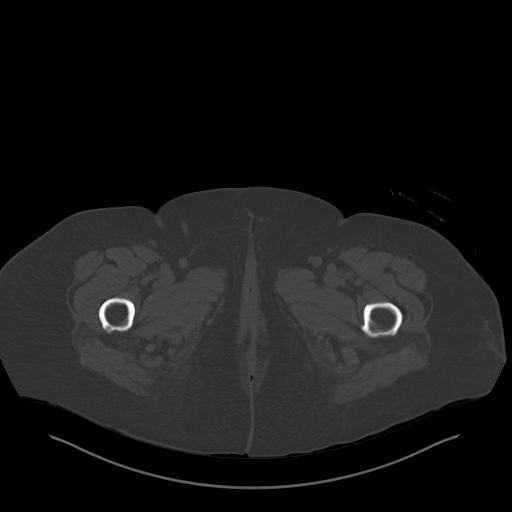
[im 11/95  soft-tissue]
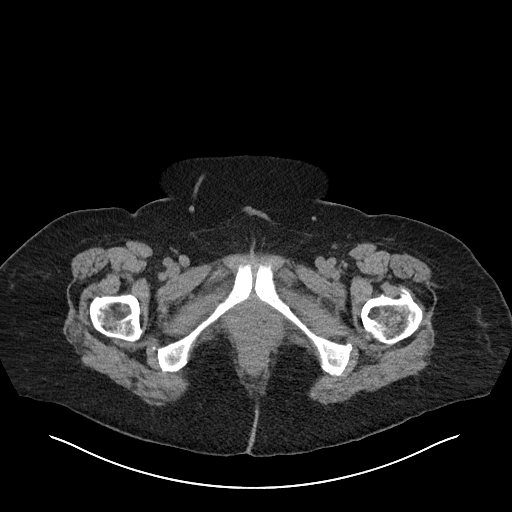
[im 21/95  soft-tissue]
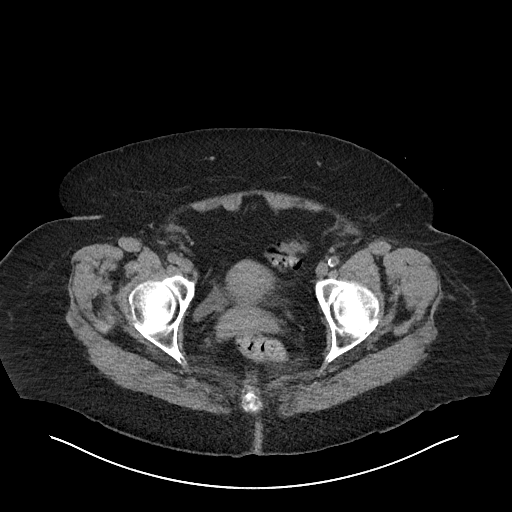
[im 27/95  soft-tissue]
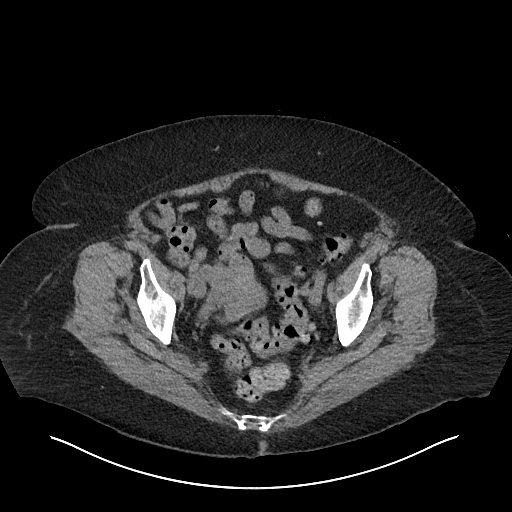
[im 32/95  soft-tissue]
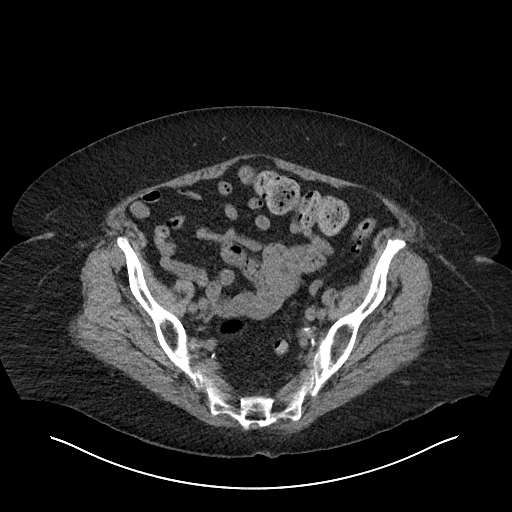
[im 42/95  soft-tissue]
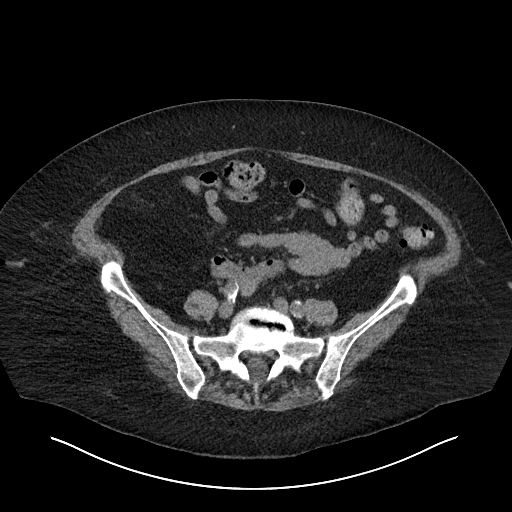
[im 48/95  soft-tissue]
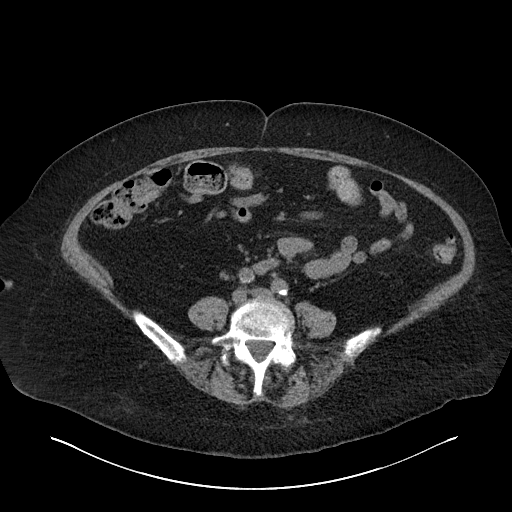
[im 53/95  soft-tissue]
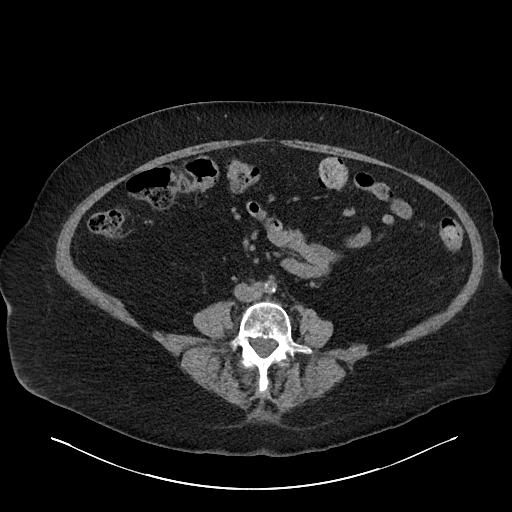
[im 63/95  soft-tissue]
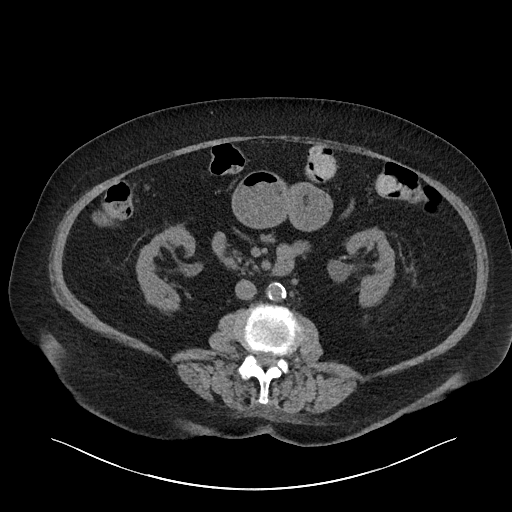
[im 63/95  bone]
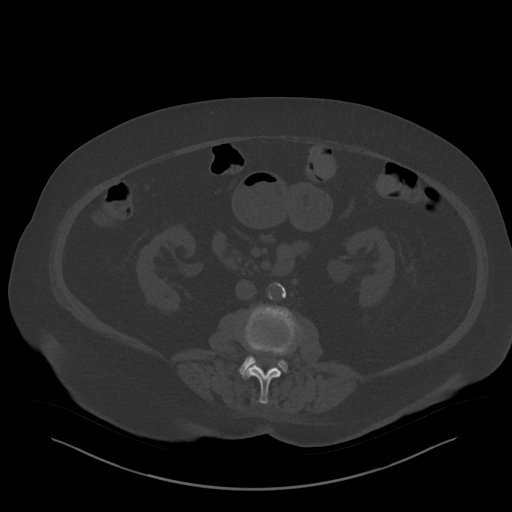
[im 68/95  soft-tissue]
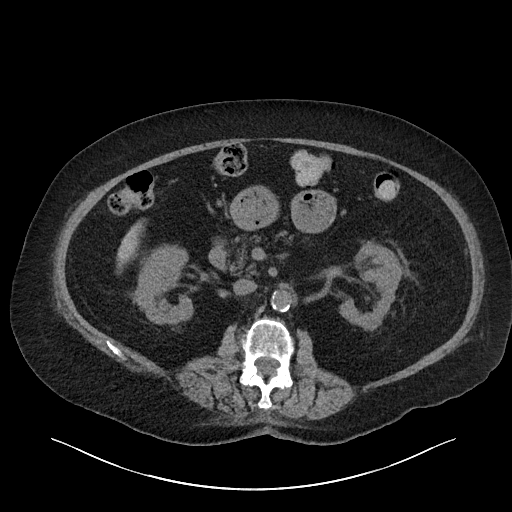
[im 74/95  soft-tissue]
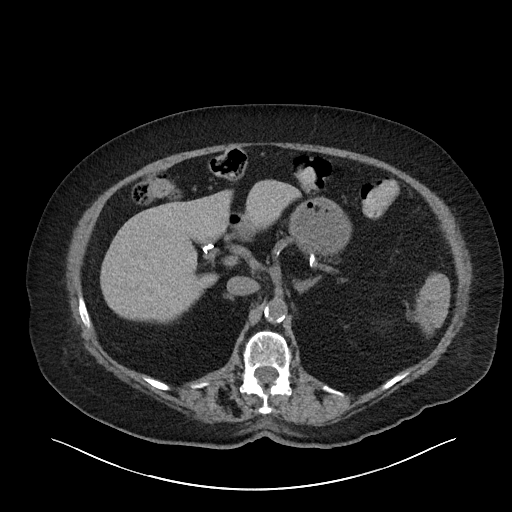
[im 84/95  soft-tissue]
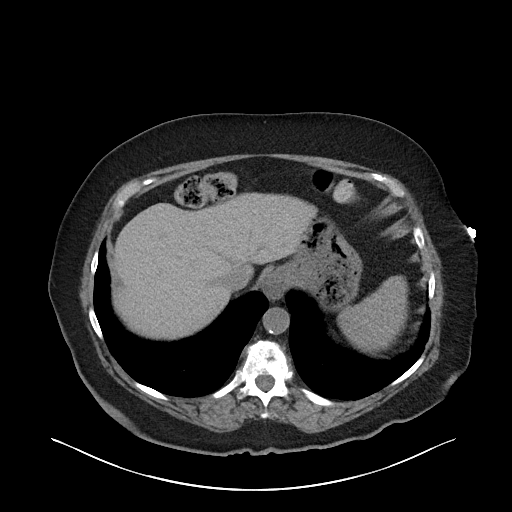
[im 89/95  soft-tissue]
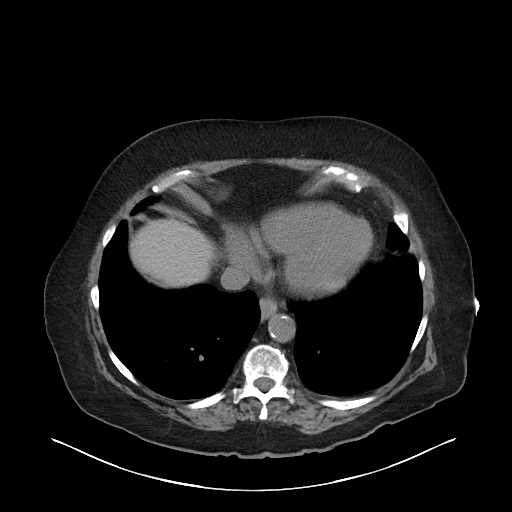

[Series 6: cor · coronal · 0.93mm/px · 3 of 114 slices shown]
[im 38/114  soft-tissue]
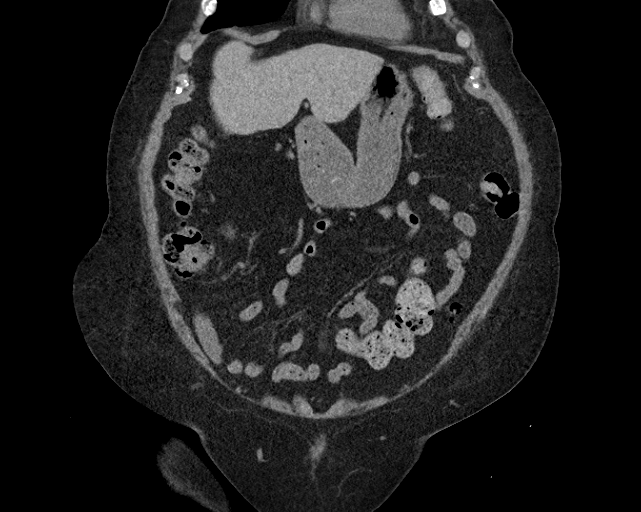
[im 51/114  soft-tissue]
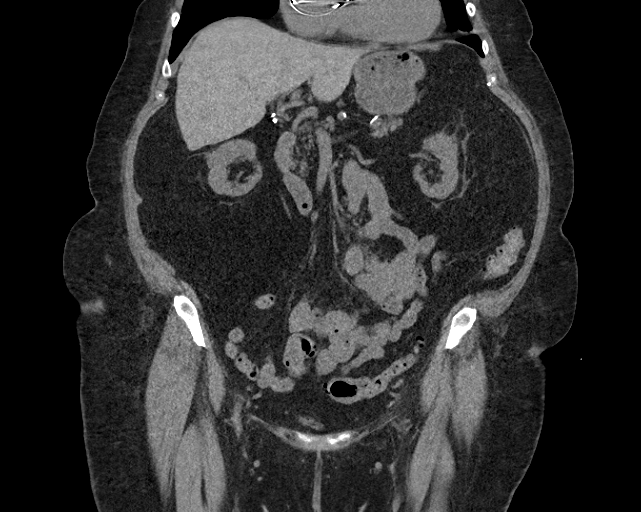
[im 63/114  soft-tissue]
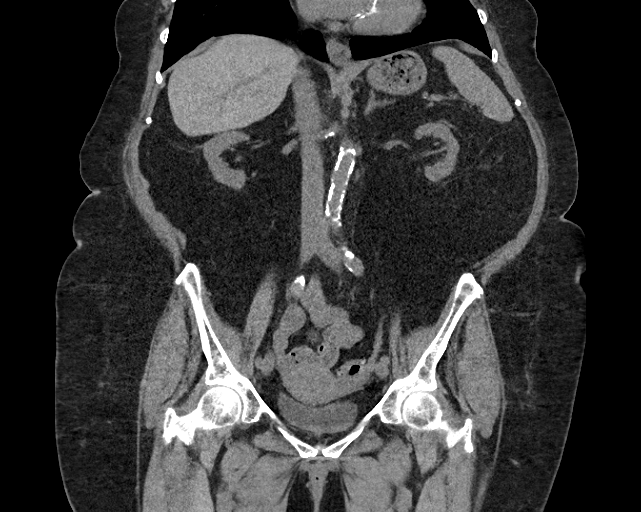

[16 of 46 positions shown; findings below may reference images not displayed]

FINDINGS: Evaluation of this exam is limited in the absence of intravenous
contrast.

Lower chest: The visualized lung bases are clear. Mechanical mitral
valve as well as partially visualized pacemaker wires.

No intra-abdominal free air or free fluid.

Hepatobiliary: The liver is unremarkable. No intrahepatic biliary
ductal dilatation. Cholecystectomy.

Pancreas: Unremarkable. No pancreatic ductal dilatation or
surrounding inflammatory changes.

Spleen: Normal in size without focal abnormality.

Adrenals/Urinary Tract: The adrenal glands are unremarkable. There
is moderate bilateral renal parenchyma atrophy. No hydronephrosis or
nephrolithiasis. The visualized ureters appear unremarkable. The
urinary bladder is predominantly collapsed.

Stomach/Bowel: There is sigmoid diverticulosis without active
inflammatory changes. There is moderate amount of stool throughout
the colon. There is no bowel obstruction or active inflammation.
Appendectomy.

Vascular/Lymphatic: Advanced aortoiliac atherosclerotic disease. No
portal venous gas. There is no adenopathy.

Reproductive: The uterus is anteverted and grossly unremarkable. No
pelvic mass.

Other: None

Musculoskeletal: Osteopenia. There is age indeterminate compression
fracture of the inferior endplate of T11 with approximately 50% loss
of vertebral body height centrally. Correlation with clinical exam
and point tenderness recommended. This fracture appears new compared
to the chest radiograph of 02/28/2017 but may have been present on
the radiograph of 11/21/2017.
IMPRESSION: 1. No hydronephrosis or nephrolithiasis.
2. Constipation.  No bowel obstruction or active inflammation.
3. Sigmoid diverticulosis.
4. Age indeterminate compression fracture of the inferior endplate
of T11 with approximately 50% loss of vertebral body height
centrally. This fracture appears new compared to the chest
radiograph of 02/28/2017 but may have been present on the radiograph
of 11/21/2017. Correlation with clinical exam and point tenderness
recommended.

## 2019-04-29 ENCOUNTER — Ambulatory Visit (INDEPENDENT_AMBULATORY_CARE_PROVIDER_SITE_OTHER): Payer: Medicare Other | Admitting: *Deleted

## 2019-04-29 DIAGNOSIS — I495 Sick sinus syndrome: Secondary | ICD-10-CM | POA: Diagnosis not present

## 2019-04-29 LAB — CUP PACEART REMOTE DEVICE CHECK
Battery Remaining Longevity: 97 mo
Battery Voltage: 2.99 V
Brady Statistic AP VP Percent: 11.48 %
Brady Statistic AP VS Percent: 81.84 %
Brady Statistic AS VP Percent: 2.74 %
Brady Statistic AS VS Percent: 4.17 %
Brady Statistic RA Percent Paced: 66.8 %
Brady Statistic RV Percent Paced: 27.89 %
Date Time Interrogation Session: 20200615050148
Implantable Lead Implant Date: 20190107
Implantable Lead Implant Date: 20190107
Implantable Lead Location: 753859
Implantable Lead Location: 753860
Implantable Lead Model: 3830
Implantable Lead Model: 5076
Implantable Pulse Generator Implant Date: 20190107
Lead Channel Impedance Value: 285 Ohm
Lead Channel Impedance Value: 361 Ohm
Lead Channel Impedance Value: 399 Ohm
Lead Channel Impedance Value: 437 Ohm
Lead Channel Pacing Threshold Amplitude: 0.875 V
Lead Channel Pacing Threshold Pulse Width: 0.4 ms
Lead Channel Sensing Intrinsic Amplitude: 1.5 mV
Lead Channel Sensing Intrinsic Amplitude: 1.5 mV
Lead Channel Sensing Intrinsic Amplitude: 1.875 mV
Lead Channel Sensing Intrinsic Amplitude: 1.875 mV
Lead Channel Setting Pacing Amplitude: 2.5 V
Lead Channel Setting Pacing Amplitude: 3 V
Lead Channel Setting Pacing Pulse Width: 1 ms
Lead Channel Setting Sensing Sensitivity: 0.9 mV

## 2019-05-05 ENCOUNTER — Encounter: Payer: Self-pay | Admitting: Cardiology

## 2019-05-05 NOTE — Progress Notes (Signed)
Remote pacemaker transmission.   

## 2019-05-17 ENCOUNTER — Other Ambulatory Visit (HOSPITAL_COMMUNITY): Payer: Self-pay | Admitting: Cardiology

## 2019-06-25 ENCOUNTER — Other Ambulatory Visit (HOSPITAL_COMMUNITY): Payer: Self-pay | Admitting: Internal Medicine

## 2019-07-29 ENCOUNTER — Ambulatory Visit (INDEPENDENT_AMBULATORY_CARE_PROVIDER_SITE_OTHER): Payer: Medicare Other | Admitting: *Deleted

## 2019-07-29 DIAGNOSIS — I495 Sick sinus syndrome: Secondary | ICD-10-CM

## 2019-07-29 DIAGNOSIS — I5022 Chronic systolic (congestive) heart failure: Secondary | ICD-10-CM

## 2019-07-29 LAB — CUP PACEART REMOTE DEVICE CHECK
Battery Remaining Longevity: 100 mo
Battery Voltage: 3 V
Brady Statistic AP VP Percent: 5.78 %
Brady Statistic AP VS Percent: 92.59 %
Brady Statistic AS VP Percent: 0.37 %
Brady Statistic AS VS Percent: 1.29 %
Brady Statistic RA Percent Paced: 97.79 %
Brady Statistic RV Percent Paced: 6.56 %
Date Time Interrogation Session: 20200914055731
Implantable Lead Implant Date: 20190107
Implantable Lead Implant Date: 20190107
Implantable Lead Location: 753859
Implantable Lead Location: 753860
Implantable Lead Model: 3830
Implantable Lead Model: 5076
Implantable Pulse Generator Implant Date: 20190107
Lead Channel Impedance Value: 285 Ohm
Lead Channel Impedance Value: 361 Ohm
Lead Channel Impedance Value: 399 Ohm
Lead Channel Impedance Value: 475 Ohm
Lead Channel Pacing Threshold Amplitude: 1.125 V
Lead Channel Pacing Threshold Pulse Width: 0.4 ms
Lead Channel Sensing Intrinsic Amplitude: 1.5 mV
Lead Channel Sensing Intrinsic Amplitude: 1.5 mV
Lead Channel Sensing Intrinsic Amplitude: 2.375 mV
Lead Channel Sensing Intrinsic Amplitude: 2.375 mV
Lead Channel Setting Pacing Amplitude: 2.25 V
Lead Channel Setting Pacing Amplitude: 3 V
Lead Channel Setting Pacing Pulse Width: 1 ms
Lead Channel Setting Sensing Sensitivity: 0.9 mV

## 2019-08-09 NOTE — Progress Notes (Signed)
Remote pacemaker transmission.   

## 2019-09-10 ENCOUNTER — Other Ambulatory Visit (HOSPITAL_COMMUNITY): Payer: Self-pay | Admitting: Cardiology

## 2019-09-17 ENCOUNTER — Other Ambulatory Visit (HOSPITAL_COMMUNITY): Payer: Self-pay | Admitting: Cardiology

## 2019-10-28 ENCOUNTER — Ambulatory Visit (INDEPENDENT_AMBULATORY_CARE_PROVIDER_SITE_OTHER): Payer: Medicare Other | Admitting: *Deleted

## 2019-10-28 DIAGNOSIS — I495 Sick sinus syndrome: Secondary | ICD-10-CM

## 2019-10-28 LAB — CUP PACEART REMOTE DEVICE CHECK
Battery Remaining Longevity: 98 mo
Battery Voltage: 3 V
Brady Statistic AP VP Percent: 9.52 %
Brady Statistic AP VS Percent: 88.49 %
Brady Statistic AS VP Percent: 0.67 %
Brady Statistic AS VS Percent: 1.57 %
Brady Statistic RA Percent Paced: 68.59 %
Brady Statistic RV Percent Paced: 25.58 %
Date Time Interrogation Session: 20201214004543
Implantable Lead Implant Date: 20190107
Implantable Lead Implant Date: 20190107
Implantable Lead Location: 753859
Implantable Lead Location: 753860
Implantable Lead Model: 3830
Implantable Lead Model: 5076
Implantable Pulse Generator Implant Date: 20190107
Lead Channel Impedance Value: 304 Ohm
Lead Channel Impedance Value: 399 Ohm
Lead Channel Impedance Value: 399 Ohm
Lead Channel Impedance Value: 475 Ohm
Lead Channel Pacing Threshold Amplitude: 1.125 V
Lead Channel Pacing Threshold Pulse Width: 0.4 ms
Lead Channel Sensing Intrinsic Amplitude: 1.375 mV
Lead Channel Sensing Intrinsic Amplitude: 1.375 mV
Lead Channel Sensing Intrinsic Amplitude: 2.625 mV
Lead Channel Sensing Intrinsic Amplitude: 2.625 mV
Lead Channel Setting Pacing Amplitude: 2.25 V
Lead Channel Setting Pacing Amplitude: 3 V
Lead Channel Setting Pacing Pulse Width: 1 ms
Lead Channel Setting Sensing Sensitivity: 0.9 mV

## 2019-11-05 ENCOUNTER — Other Ambulatory Visit (HOSPITAL_COMMUNITY): Payer: Self-pay | Admitting: Internal Medicine

## 2019-11-30 NOTE — Progress Notes (Signed)
PPM remote 

## 2020-01-06 ENCOUNTER — Other Ambulatory Visit: Payer: Self-pay

## 2020-01-06 ENCOUNTER — Other Ambulatory Visit (HOSPITAL_COMMUNITY): Payer: Self-pay | Admitting: Cardiology

## 2020-01-06 ENCOUNTER — Ambulatory Visit (INDEPENDENT_AMBULATORY_CARE_PROVIDER_SITE_OTHER): Payer: Medicare Other | Admitting: Cardiology

## 2020-01-06 ENCOUNTER — Encounter: Payer: Self-pay | Admitting: Cardiology

## 2020-01-06 ENCOUNTER — Encounter: Payer: Medicare Other | Admitting: Cardiology

## 2020-01-06 VITALS — BP 116/70 | HR 72 | Ht 65.0 in | Wt 215.0 lb

## 2020-01-06 DIAGNOSIS — I495 Sick sinus syndrome: Secondary | ICD-10-CM

## 2020-01-06 DIAGNOSIS — Z950 Presence of cardiac pacemaker: Secondary | ICD-10-CM | POA: Diagnosis not present

## 2020-01-06 DIAGNOSIS — Z79899 Other long term (current) drug therapy: Secondary | ICD-10-CM | POA: Diagnosis not present

## 2020-01-06 DIAGNOSIS — I480 Paroxysmal atrial fibrillation: Secondary | ICD-10-CM

## 2020-01-06 LAB — CUP PACEART INCLINIC DEVICE CHECK
Battery Remaining Longevity: 93 mo
Battery Voltage: 3 V
Brady Statistic AP VP Percent: 7.78 %
Brady Statistic AP VS Percent: 89.27 %
Brady Statistic AS VP Percent: 1.31 %
Brady Statistic AS VS Percent: 1.85 %
Brady Statistic RA Percent Paced: 81.32 %
Brady Statistic RV Percent Paced: 17.65 %
Date Time Interrogation Session: 20210222165439
Implantable Lead Implant Date: 20190107
Implantable Lead Implant Date: 20190107
Implantable Lead Location: 753859
Implantable Lead Location: 753860
Implantable Lead Model: 3830
Implantable Lead Model: 5076
Implantable Pulse Generator Implant Date: 20190107
Lead Channel Impedance Value: 285 Ohm
Lead Channel Impedance Value: 380 Ohm
Lead Channel Impedance Value: 380 Ohm
Lead Channel Impedance Value: 456 Ohm
Lead Channel Pacing Threshold Amplitude: 1 V
Lead Channel Pacing Threshold Amplitude: 1.25 V
Lead Channel Pacing Threshold Pulse Width: 0.4 ms
Lead Channel Pacing Threshold Pulse Width: 1 ms
Lead Channel Sensing Intrinsic Amplitude: 0.5 mV
Lead Channel Sensing Intrinsic Amplitude: 1.75 mV
Lead Channel Setting Pacing Amplitude: 2.5 V
Lead Channel Setting Pacing Amplitude: 3 V
Lead Channel Setting Pacing Pulse Width: 1 ms
Lead Channel Setting Sensing Sensitivity: 0.9 mV

## 2020-01-06 NOTE — Patient Instructions (Addendum)
Medication Instructions:  Your physician recommends that you continue on your current medications as directed. Please refer to the Current Medication list given to you today.  *If you need a refill on your cardiac medications before your next appointment, please call your pharmacy*  Lab Work: Lab today: CMET, TSH & CBC If you have labs (blood work) drawn today and your tests are completely normal, you will receive your results only by: Marland Kitchen MyChart Message (if you have MyChart) OR . A paper copy in the mail If you have any lab test that is abnormal or we need to change your treatment, we will call you to review the results.  Testing/Procedures: None ordered  Follow-Up: At [facility], you and your health needs are our priority.  As part of our continuing mission to provide you with exceptional heart care, we have created designated Provider Care Teams.  These Care Teams include your primary Cardiologist (physician) and Advanced Practice Providers (APPs -  Physician Assistants and Nurse Practitioners) who all work together to provide you with the care you need, when you need it.  Your next appointment:   6 month(s)  The format for your next appointment:   In Person  Provider:   Allegra Lai, MD  Thank you for choosing [facility]!!   Trinidad Curet, RN [phone]

## 2020-01-06 NOTE — Progress Notes (Deleted)
Electrophysiology Office Note   Date:  01/06/2020   ID:  Shannon Lucas, DOB 1931-09-23, MRN [id number]  PCP:  Shannon Shirts, PA  Cardiologist:  Aundra Dubin Primary Electrophysiologist:  Constance Haw, MD    No chief complaint on file.    History of Present Illness: Shannon Lucas is a 86 y.o. female who is being seen today for the evaluation of CHF at the request of Shannon, Lori Lucas, [place]. Presenting today for electrophysiology evaluation.  She has a history of nonischemic cardiomyopathy, paroxysmal atrial fibrillation on Xarelto and amiodarone, stage III CKD, type 2 diabetes, hyperlipidemia, CVA, and coronary artery disease.  She is currently on Entresto.  She had a recent echocardiogram which showed an improvement of her ejection fraction to 35-40% from 25-30%.  She had a Medtronic dual-chamber pacemaker implanted 11/20/17.  Today, denies symptoms of palpitations, chest pain, shortness of breath, orthopnea, PND, lower extremity edema, claudication, dizziness, presyncope, syncope, bleeding, or neurologic sequela. The patient is tolerating medications without difficulties. ***   Past Medical History:  Diagnosis Date  . A-fib (Shannon Lucas)   . Arthritis    "a little bit qwhere" (02/28/2017)  . CHF (congestive heart failure) ([place])   . CKD (chronic kidney disease), stage III (Shannon Lucas)    [name] 02/28/2017  . Depression   . GERD (gastroesophageal reflux disease)   . High cholesterol   . Hypertension   . Melanoma of back (Shannon Lucas)   . Myocardial infarction Shannon Lucas)    "one dr said I'd had 1" (02/28/2017)  . Stroke [facility]) ~ 2010; ~2012   "affected the right side but I fully recovered; just a light one" (02/28/2017)  . Type II diabetes mellitus (Shannon Lucas)    Past Surgical History:  Procedure Laterality Date  . APPENDECTOMY    . FALSE ANEURYSM REPAIR Right 03/08/2017   Procedure: REPAIR RIGHT RADIAL FALSE ANEURYSM;  Surgeon: Shannon Posner, MD;  Location: [place];  Service: Vascular;  Laterality: Right;  . KNEE SURGERY Left  1970s   "I have 1/3 of my knee left in there"  . LAPAROSCOPIC CHOLECYSTECTOMY    . MELANOMA EXCISION     "back"  . PACEMAKER IMPLANT N/A 11/20/2017   Procedure: PACEMAKER IMPLANT;  Surgeon: Constance Haw, MD;  Location: [place] [facility];  Service: Cardiovascular;  Laterality: N/A;  . RIGHT/LEFT HEART CATH AND CORONARY ANGIOGRAPHY N/A 03/06/2017   Procedure: Right/Left Heart Cath and Coronary Angiography;  Surgeon: Shannon Dresser, MD;  Location: [place] [facility];  Service: Cardiovascular;  Laterality: N/A;  . TUBAL LIGATION       Current Outpatient Medications  Medication Sig Dispense Refill  . acetaminophen (TYLENOL) 500 MG tablet Take 1,000 mg by mouth every 6 (six) hours as needed for moderate pain or headache.    Marland Kitchen amiodarone (PACERONE) 200 MG tablet TAKE 1 TABLET BY MOUTH DAIY 30 tablet 3  . benzonatate (TESSALON) 100 MG capsule Take 100 mg by mouth 3 (three) times daily as needed for cough.    Marland Kitchen ENTRESTO 49-51 MG TAKE 1 TABLET BY MOUTH 2 TIMES DAILY 60 tablet 5  . ergocalciferol (VITAMIN D2) 50000 units capsule Take 50,000 Units by mouth every Friday. At night    . ferrous sulfate 325 (65 FE) MG tablet Take 1 tablet (325 mg total) by mouth 3 (three) times daily with meals. 90 tablet 6  . furosemide (LASIX) 40 MG tablet TAKE HALF(1/2) TABLET BY MOUTH DAILY 15 tablet 3  . ipratropium (ATROVENT HFA) 17  MCG/ACT inhaler Inhale 2 puffs into the lungs every 6 (six) hours as needed for wheezing. 1 Inhaler 12  . isosorbide mononitrate (IMDUR) 30 MG 24 hr tablet Take 1 tablet (30 mg total) by mouth daily. 30 tablet 11  . LANTUS SOLOSTAR 100 UNIT/ML Solostar Pen Inject 50 Units into the skin daily.   3  . metoprolol succinate (TOPROL-XL) 100 MG 24 hr tablet TAKE 1 TABLET BY MOUTH DAILY TAKE WITH OR IMMDIATELY FOLLOWING A MEAL 90 tablet 3  . pravastatin (PRAVACHOL) 80 MG tablet Take 1 tablet (80 mg total) by mouth at bedtime. 30 tablet 11  . spironolactone (ALDACTONE) 25 MG tablet TAKE  1/2 TABLET BY MOUTH DAILY 15 tablet 6  . venlafaxine (EFFEXOR) 75 MG tablet Take 75 mg by mouth daily.     [name] 15 MG TABS tablet TAKE 1 TABLET BY MOUTH DAILY WITH SUPPER 30 tablet 11   No current facility-administered medications for this visit.    Allergies:   Patient has no known allergies.   Social History:  The patient  reports that she has quit smoking. She has never used smokeless tobacco. She reports that she does not drink alcohol or use drugs.   Family History:  The patient's family history includes Diabetes in her mother; Parkinson's disease in her brother; Stroke in her father.    ROS:  Please see the history of present illness.   Otherwise, review of systems is positive for ***.   All other systems are reviewed and negative.   PHYSICAL EXAM: VS:  There were no vitals taken for this visit. , BMI There is no height or weight on file to calculate BMI. GEN: Well nourished, well developed, in no acute distress  HEENT: normal  Neck: no JVD, carotid bruits, or masses Cardiac: ***RRR; no murmurs, rubs, or gallops,no edema  Respiratory:  clear to auscultation bilaterally, normal work of breathing GI: soft, nontender, nondistended, + BS MS: no deformity or atrophy  Skin: warm and dry, device site well healed Neuro:  Strength and sensation are intact Psych: euthymic mood, full affect  EKG:  EKG {ACTION; IS/IS [id number]} ordered today. Personal review of the ekg ordered *** shows ***  Personal review of the device interrogation today. Results in [place]: No results found for requested labs within last 8760 hours.    Lipid Panel     Component Value Date/Time   CHOL 184 05/28/2018 1413   TRIG 122 05/28/2018 1413   HDL 55 05/28/2018 1413   CHOLHDL 3.3 05/28/2018 1413   VLDL 24 05/28/2018 1413   LDLCALC 105 (H) 05/28/2018 1413     Wt Readings from Last 3 Encounters:  03/31/19 224 lb (101.6 kg)  03/19/19 220 lb (99.8 kg)  12/18/18 220 lb (99.8  kg)      Other studies Reviewed: Additional studies/ records that were reviewed today include: TTE 12/18/2018 Review of the above records today demonstrates:  1. The left ventricle has normal systolic function of 31-51%. The cavity  size is normal. There is mildly increased left ventricular wall thickness.  Indeterminant diastolic function. Normal wall motion.  2. The right ventricle has normal systolic function. The cavity in normal  in size. There is no increase in right ventricular wall thickness.  3. Mildly dilated left atrial size.  4. The aortic valve is tricuspid. There is mild calcification of the  aortic valve. No aortic stenosis.  5. The aortic root and ascending aorta are normal in size  and structure.  6. The inferior vena cava was dilated in size with >50% respiratory  variability.  7. The interatrial septum was not well visualized.  8. The inferior vena cava was dilated in size with greater than 50%  respiratory variability.  9. No evidence of left ventricular regional wall motion abnormalities.  10. No significant mitral regurgitation.  11. No complete TR doppler jet so unable to estimate PA systolic pressure.  12. The mitral valve is normal in structure There is mild to moderate  mitral annular calcification present.  13. The pulmonic valve is grossly normal. Pulmonic valve regurgitation was  not assessed by color flow Doppler.   Holter 09/06/17 - personally reviewed 1. Abnormal holter.  2. Atrial fibrillation at times with RVR noted.  3. I am concerned that there are runs of VT (not just afib with aberrancy).  4. Up to 2 second pauses.    ASSESSMENT AND PLAN:  1.  Chronic systolic heart failure due to nonischemic cardiomyopathy: On Toprol-XL, Entresto, Aldactone.  Has had improvement in ejection fraction.  ***  2.  Paroxysmal atrial fibrillation: Currently on amiodarone and Xarelto.  CHA2DS2-VASc of 5.  ***  3.  Tachybradycardia syndrome: Status post  Medtronic dual-chamber pacemaker implanted 11/30/2017.  Device functioning appropriately.  No changes at this time.  Current medicines are reviewed at length with the patient today.   The patient does not have concerns regarding her medicines.  The following changes were made today: ***  Labs/ tests ordered today include:  No orders of the defined types were placed in this encounter.    Disposition:   FU with Kaytlynne Neace *** months  Signed, Galaxy Borden Meredith Leeds, MD  01/06/2020 10:42 AM     [facility] [address] [place] [address] [phone] (office) [phone] (fax)

## 2020-01-06 NOTE — Progress Notes (Signed)
Electrophysiology Office Note   Date:  01/06/2020   ID:  Shannon Lucas, DOB July 25, 1931, MRN [id number]  PCP:  Manfred Shirts, PA  Cardiologist:  Aundra Dubin Primary Electrophysiologist:  Constance Haw, MD    No chief complaint on file.    History of Present Illness: Shannon Lucas is a 86 y.o. female who is being seen today for the evaluation of CHF at the request of [name], [name], [place]. Presenting today for electrophysiology evaluation.  She has a history of nonischemic cardiomyopathy, paroxysmal atrial fibrillation on Xarelto and amiodarone, stage III CKD, type 2 diabetes, hyperlipidemia, CVA, and coronary artery disease.  She is currently on Entresto.  She had a recent echocardiogram which showed an improvement of her ejection fraction to 35-40% from 25-30%.  She had a Medtronic dual-chamber pacemaker implanted 11/20/17.  Today, denies symptoms of palpitations, chest pain, shortness of breath, orthopnea, PND, lower extremity edema, claudication, dizziness, presyncope, syncope, bleeding, or neurologic sequela. The patient is tolerating medications without difficulties.  Is been doing well.  No chest pain or shortness of breath.  She does have question about the COVID vaccine. Her granddaughter told her to get vaccinated.  I ensured her that is a safe option.  Past Medical History:  Diagnosis Date  . A-fib ([place])   . Arthritis    "a little bit qwhere" (02/28/2017)  . CHF (congestive heart failure) ([place])   . CKD (chronic kidney disease), stage III    Shannon Lucas 02/28/2017  . Depression   . GERD (gastroesophageal reflux disease)   . High cholesterol   . Hypertension   . Melanoma of back ([place])   . Myocardial infarction [facility])    "one dr said I'd had 1" (02/28/2017)  . Stroke [facility]) ~ 2010; ~2012   "affected the right side but I fully recovered; just a light one" (02/28/2017)  . Type II diabetes mellitus ([place])    Past Surgical History:  Procedure Laterality Date  . APPENDECTOMY    . FALSE ANEURYSM  REPAIR Right 03/08/2017   Procedure: REPAIR RIGHT RADIAL FALSE ANEURYSM;  Surgeon: Rosetta Posner, MD;  Location: [place];  Service: Vascular;  Laterality: Right;  . KNEE SURGERY Left 1970s   "I have 1/3 of my knee left in there"  . LAPAROSCOPIC CHOLECYSTECTOMY    . MELANOMA EXCISION     "back"  . PACEMAKER IMPLANT N/A 11/20/2017   Procedure: PACEMAKER IMPLANT;  Surgeon: Constance Haw, MD;  Location: [place] [facility];  Service: Cardiovascular;  Laterality: N/A;  . RIGHT/LEFT HEART CATH AND CORONARY ANGIOGRAPHY N/A 03/06/2017   Procedure: Right/Left Heart Cath and Coronary Angiography;  Surgeon: Larey Dresser, MD;  Location: [place] [facility];  Service: Cardiovascular;  Laterality: N/A;  . TUBAL LIGATION       Current Outpatient Medications  Medication Sig Dispense Refill  . acetaminophen (TYLENOL) 500 MG tablet Take 1,000 mg by mouth every 6 (six) hours as needed for moderate pain or headache.    Marland Kitchen amiodarone (PACERONE) 200 MG tablet TAKE 1 TABLET BY MOUTH DAIY 30 tablet 3  . benzonatate (TESSALON) 100 MG capsule Take 100 mg by mouth 3 (three) times daily as needed for cough.    Marland Kitchen ENTRESTO 49-51 MG TAKE 1 TABLET BY MOUTH 2 TIMES DAILY 60 tablet 5  . ergocalciferol (VITAMIN D2) 50000 units capsule Take 50,000 Units by mouth every Friday. At night    . ferrous sulfate 325 (65 FE) MG tablet Take 1 tablet (325  mg total) by mouth 3 (three) times daily with meals. 90 tablet 6  . furosemide (LASIX) 40 MG tablet TAKE HALF(1/2) TABLET BY MOUTH DAILY 15 tablet 3  . ipratropium (ATROVENT HFA) 17 MCG/ACT inhaler Inhale 2 puffs into the lungs every 6 (six) hours as needed for wheezing. 1 Inhaler 12  . isosorbide mononitrate (IMDUR) 30 MG 24 hr tablet Take 1 tablet (30 mg total) by mouth daily. 30 tablet 11  . LANTUS SOLOSTAR 100 UNIT/ML Solostar Pen Inject 50 Units into the skin daily.   3  . metoprolol succinate (TOPROL-XL) 100 MG 24 hr tablet TAKE 1 TABLET BY MOUTH DAILY TAKE WITH OR IMMDIATELY  FOLLOWING A MEAL 90 tablet 3  . pravastatin (PRAVACHOL) 80 MG tablet Take 1 tablet (80 mg total) by mouth at bedtime. 30 tablet 11  . spironolactone (ALDACTONE) 25 MG tablet TAKE 1/2 TABLET BY MOUTH DAILY 15 tablet 6  . venlafaxine (EFFEXOR) 75 MG tablet Take 75 mg by mouth daily.     Shannon Lucas 15 MG TABS tablet TAKE 1 TABLET BY MOUTH DAILY WITH SUPPER 30 tablet 11   No current facility-administered medications for this visit.    Allergies:   Patient has no known allergies.   Social History:  The patient  reports that she has quit smoking. She has never used smokeless tobacco. She reports that she does not drink alcohol or use drugs.   Family History:  The patient's family history includes Diabetes in her mother; Parkinson's disease in her brother; Stroke in her father.   ROS:  Please see the history of present illness.   Otherwise, review of systems is positive for none.   All other systems are reviewed and negative.   PHYSICAL EXAM: VS:  BP 116/70   Pulse 72   Ht 5' 5"  (1.651 m)   Wt 215 lb (97.5 kg)   SpO2 96%   BMI 35.78 kg/m  , BMI Body mass index is 35.78 kg/m. GEN: Well nourished, well developed, in no acute distress  HEENT: normal  Neck: no JVD, carotid bruits, or masses Cardiac: RRR; no murmurs, rubs, or gallops,no edema  Respiratory:  clear to auscultation bilaterally, normal work of breathing GI: soft, nontender, nondistended, + BS MS: no deformity or atrophy  Skin: warm and dry, device site well healed Neuro:  Strength and sensation are intact Psych: euthymic mood, full affect  EKG:  EKG is ordered today. Personal review of the ekg ordered shows atrial paced  Personal review of the device interrogation today. Results in [place]: No results found for requested labs within last 8760 hours.    Lipid Panel     Component Value Date/Time   CHOL 184 05/28/2018 1413   TRIG 122 05/28/2018 1413   HDL 55 05/28/2018 1413   CHOLHDL 3.3 05/28/2018 1413    VLDL 24 05/28/2018 1413   LDLCALC 105 (H) 05/28/2018 1413     Wt Readings from Last 3 Encounters:  01/06/20 215 lb (97.5 kg)  03/31/19 224 lb (101.6 kg)  03/19/19 220 lb (99.8 kg)      Other studies Reviewed: Additional studies/ records that were reviewed today include: TTE 12/18/18 Review of the above records today demonstrates:  1. The left ventricle has normal systolic function of 33-29%. The cavity  size is normal. There is mildly increased left ventricular wall thickness.  Indeterminant diastolic function. Normal wall motion.  2. The right ventricle has normal systolic function. The cavity in normal  in size. There is no increase in right ventricular wall thickness.  3. Mildly dilated left atrial size.  4. The aortic valve is tricuspid. There is mild calcification of the  aortic valve. No aortic stenosis.  5. The aortic root and ascending aorta are normal in size and structure.  6. The inferior vena cava was dilated in size with >50% respiratory  variability.  7. The interatrial septum was not well visualized.  8. The inferior vena cava was dilated in size with greater than 50%  respiratory variability.  9. No evidence of left ventricular regional wall motion abnormalities.  10. No significant mitral regurgitation.  11. No complete TR doppler jet so unable to estimate PA systolic pressure.  12. The mitral valve is normal in structure There is mild to moderate  mitral annular calcification present.  13. The pulmonic valve is grossly normal. Pulmonic valve regurgitation was  not assessed by color flow Doppler.   Holter 09/06/17 - personally reviewed 1. Abnormal holter.  2. Atrial fibrillation at times with RVR noted.  3. I am concerned that there are runs of VT (not just afib with aberrancy).  4. Up to 2 second pauses.    ASSESSMENT AND PLAN:  1.  Chronic systolic heart failure due to nonischemic cardiomyopathy: On Toprol-XL, Entresto, and Aldactone.  Her  ejection fraction is improved.  Today the pain is  2.  Paroxysmal atrial fibrillation: On amiodarone and Xarelto.  CHA2DS2-VASc of 5.  Has intermittent episodes of atrial fibrillation but overall remains in sinus rhythm.  Shannon Lucas check amiodarone labs today.  3.  Tachybradycardia syndrome: Status post Medtronic dual-chamber pacemaker implanted 11/20/2017.  Device functioning appropriately.  No changes.    Current medicines are reviewed at length with the patient today.   The patient does not have concerns regarding her medicines.  The following changes were made today: none  Labs/ tests ordered today include:  Orders Placed This Encounter  Procedures  . Comp Met (CMET)  . CBC  . TSH  . EKG 12-Lead     Disposition:   FU with Shannon Lucas 6 months  Signed, Shannon Seibold Meredith Leeds, MD  01/06/2020 4:30 PM     [address] [place] [address] [phone] (office) [phone] (fax)

## 2020-01-07 LAB — CBC
Hematocrit: 42.2 % (ref 34.0–46.6)
Hemoglobin: 13.7 g/dL (ref 11.1–15.9)
MCH: 31.5 pg (ref 26.6–33.0)
MCHC: 32.5 g/dL (ref 31.5–35.7)
MCV: 97 fL (ref 79–97)
Platelets: 227 10*3/uL (ref 150–450)
RBC: 4.35 x10E6/uL (ref 3.77–5.28)
RDW: 14.6 % (ref 11.7–15.4)
WBC: 8.2 10*3/uL (ref 3.4–10.8)

## 2020-01-07 LAB — COMPREHENSIVE METABOLIC PANEL
ALT: 12 IU/L (ref 0–32)
AST: 16 IU/L (ref 0–40)
Albumin/Globulin Ratio: 1.8 (ref 1.2–2.2)
Albumin: 4.2 g/dL (ref 3.6–4.6)
Alkaline Phosphatase: 121 IU/L — ABNORMAL HIGH (ref 39–117)
BUN/Creatinine Ratio: 17 (ref 12–28)
BUN: 38 mg/dL — ABNORMAL HIGH (ref 8–27)
Bilirubin Total: 0.4 mg/dL (ref 0.0–1.2)
CO2: 22 mmol/L (ref 20–29)
Calcium: 10.3 mg/dL (ref 8.7–10.3)
Chloride: 103 mmol/L (ref 96–106)
Creatinine, Ser: 2.3 mg/dL — ABNORMAL HIGH (ref 0.57–1.00)
GFR calc Af Amer: 22 mL/min/{1.73_m2} — ABNORMAL LOW (ref >59)
GFR calc non Af Amer: 19 mL/min/{1.73_m2} — ABNORMAL LOW (ref >59)
Globulin, Total: 2.3 g/dL (ref 1.5–4.5)
Glucose: 138 mg/dL — ABNORMAL HIGH (ref 65–99)
Potassium: 5.3 mmol/L — ABNORMAL HIGH (ref 3.5–5.2)
Sodium: 141 mmol/L (ref 134–144)
Total Protein: 6.5 g/dL (ref 6.0–8.5)

## 2020-01-07 LAB — TSH: TSH: 9.16 u[IU]/mL — ABNORMAL HIGH (ref 0.450–4.500)

## 2020-01-27 ENCOUNTER — Ambulatory Visit (INDEPENDENT_AMBULATORY_CARE_PROVIDER_SITE_OTHER): Payer: Medicare Other | Admitting: *Deleted

## 2020-01-27 DIAGNOSIS — I495 Sick sinus syndrome: Secondary | ICD-10-CM | POA: Diagnosis not present

## 2020-01-27 LAB — CUP PACEART REMOTE DEVICE CHECK
Battery Remaining Longevity: 93 mo
Battery Voltage: 3 V
Brady Statistic AP VP Percent: 1.22 %
Brady Statistic AP VS Percent: 96.72 %
Brady Statistic AS VP Percent: 0.39 %
Brady Statistic AS VS Percent: 1.67 %
Brady Statistic RA Percent Paced: 98.03 %
Brady Statistic RV Percent Paced: 1.68 %
Date Time Interrogation Session: 20210315012947
Implantable Lead Implant Date: 20190107
Implantable Lead Implant Date: 20190107
Implantable Lead Location: 753859
Implantable Lead Location: 753860
Implantable Lead Model: 3830
Implantable Lead Model: 5076
Implantable Pulse Generator Implant Date: 20190107
Lead Channel Impedance Value: 285 Ohm
Lead Channel Impedance Value: 380 Ohm
Lead Channel Impedance Value: 380 Ohm
Lead Channel Impedance Value: 475 Ohm
Lead Channel Pacing Threshold Amplitude: 1.25 V
Lead Channel Pacing Threshold Pulse Width: 0.4 ms
Lead Channel Sensing Intrinsic Amplitude: 1.375 mV
Lead Channel Sensing Intrinsic Amplitude: 1.375 mV
Lead Channel Sensing Intrinsic Amplitude: 2.875 mV
Lead Channel Sensing Intrinsic Amplitude: 2.875 mV
Lead Channel Setting Pacing Amplitude: 2.5 V
Lead Channel Setting Pacing Amplitude: 3 V
Lead Channel Setting Pacing Pulse Width: 1 ms
Lead Channel Setting Sensing Sensitivity: 0.9 mV

## 2020-01-27 NOTE — Progress Notes (Signed)
PPM Remote  

## 2020-02-13 ENCOUNTER — Telehealth: Payer: Self-pay | Admitting: Student

## 2020-02-13 ENCOUNTER — Other Ambulatory Visit (HOSPITAL_COMMUNITY): Payer: Self-pay | Admitting: Cardiology

## 2020-02-13 MED ORDER — ENTRESTO 49-51 MG PO TABS: 1.0000 | ORAL_TABLET | Freq: Two times a day (BID) | ORAL | 0 refills | Status: DC

## 2020-02-13 NOTE — Telephone Encounter (Signed)
   Received call from [facility] that patient is out of Entresto and requesting refill. Reviewed chart. Patient followed by Dr. Aundra Dubin. Last seen by CHF Clinic in 03/2019 and continued on Entresto at that time. Looks like she is over due for follow-up visit with Dr. Claris Gladden office. Therefore, will given 1 month refill but advised Pharmacy that patient will need to schedule follow-up visit. Pharmacist said he would let the patient known. Of note, most recent BMET on 12/2019 did show elevated creatinine of 2.30 and potassium was slightly elevated at 5.3. Patient was advised to follow-up with PCP. Saw PCP on 01/28/2020. Creatinine 2.21 at that time and potassium 4.3. PCP did not change any medications but did refer patient to Nephrology.  Called and spoke with patient. She states she was only taking Entresto once daily for a while because it was a hassle to take her medications. However, since seeing her PCP last, she has been taking it twice a day. Feeling well. Advised patient that I would send in a 1 month supply of Entresto but she should call CHF clinic tomorrow to schedule follow-up visit. She voiced understanding and agreed.   Darreld Mclean, PA-C 02/13/2020 5:54 PM

## 2020-02-13 NOTE — Telephone Encounter (Signed)
This is a CHF pt, Dr. Mclean 

## 2020-02-22 ENCOUNTER — Emergency Department (HOSPITAL_COMMUNITY): Payer: Medicare Other

## 2020-02-22 ENCOUNTER — Encounter (HOSPITAL_COMMUNITY): Payer: Self-pay | Admitting: Family Medicine

## 2020-02-22 ENCOUNTER — Other Ambulatory Visit: Payer: Self-pay

## 2020-02-22 ENCOUNTER — Inpatient Hospital Stay (HOSPITAL_COMMUNITY)
Admission: EM | Admit: 2020-02-22 | Discharge: 2020-02-27 | DRG: 871 | Disposition: A | Discharge status: Home Health | Payer: Medicare Other | Attending: Student | Admitting: Student

## 2020-02-22 DIAGNOSIS — E1122 Type 2 diabetes mellitus with diabetic chronic kidney disease: Secondary | ICD-10-CM | POA: Diagnosis present

## 2020-02-22 DIAGNOSIS — R7989 Other specified abnormal findings of blood chemistry: Secondary | ICD-10-CM | POA: Diagnosis not present

## 2020-02-22 DIAGNOSIS — I4892 Unspecified atrial flutter: Secondary | ICD-10-CM | POA: Diagnosis present

## 2020-02-22 DIAGNOSIS — G9341 Metabolic encephalopathy: Secondary | ICD-10-CM | POA: Diagnosis present

## 2020-02-22 DIAGNOSIS — K7460 Unspecified cirrhosis of liver: Secondary | ICD-10-CM | POA: Diagnosis present

## 2020-02-22 DIAGNOSIS — Z833 Family history of diabetes mellitus: Secondary | ICD-10-CM | POA: Diagnosis not present

## 2020-02-22 DIAGNOSIS — N183 Chronic kidney disease, stage 3 unspecified: Secondary | ICD-10-CM | POA: Diagnosis present

## 2020-02-22 DIAGNOSIS — D6959 Other secondary thrombocytopenia: Secondary | ICD-10-CM | POA: Diagnosis present

## 2020-02-22 DIAGNOSIS — N390 Urinary tract infection, site not specified: Secondary | ICD-10-CM | POA: Diagnosis present

## 2020-02-22 DIAGNOSIS — I5023 Acute on chronic systolic (congestive) heart failure: Secondary | ICD-10-CM | POA: Diagnosis present

## 2020-02-22 DIAGNOSIS — E1165 Type 2 diabetes mellitus with hyperglycemia: Secondary | ICD-10-CM | POA: Diagnosis not present

## 2020-02-22 DIAGNOSIS — F419 Anxiety disorder, unspecified: Secondary | ICD-10-CM | POA: Diagnosis present

## 2020-02-22 DIAGNOSIS — Z823 Family history of stroke: Secondary | ICD-10-CM

## 2020-02-22 DIAGNOSIS — Z7901 Long term (current) use of anticoagulants: Secondary | ICD-10-CM

## 2020-02-22 DIAGNOSIS — Z79899 Other long term (current) drug therapy: Secondary | ICD-10-CM

## 2020-02-22 DIAGNOSIS — J189 Pneumonia, unspecified organism: Secondary | ICD-10-CM | POA: Diagnosis present

## 2020-02-22 DIAGNOSIS — I495 Sick sinus syndrome: Secondary | ICD-10-CM | POA: Diagnosis not present

## 2020-02-22 DIAGNOSIS — Z794 Long term (current) use of insulin: Secondary | ICD-10-CM | POA: Diagnosis not present

## 2020-02-22 DIAGNOSIS — I130 Hypertensive heart and chronic kidney disease with heart failure and stage 1 through stage 4 chronic kidney disease, or unspecified chronic kidney disease: Secondary | ICD-10-CM | POA: Diagnosis present

## 2020-02-22 DIAGNOSIS — I428 Other cardiomyopathies: Secondary | ICD-10-CM | POA: Diagnosis present

## 2020-02-22 DIAGNOSIS — Z8673 Personal history of transient ischemic attack (TIA), and cerebral infarction without residual deficits: Secondary | ICD-10-CM | POA: Diagnosis not present

## 2020-02-22 DIAGNOSIS — A419 Sepsis, unspecified organism: Secondary | ICD-10-CM | POA: Diagnosis present

## 2020-02-22 DIAGNOSIS — E119 Type 2 diabetes mellitus without complications: Secondary | ICD-10-CM

## 2020-02-22 DIAGNOSIS — Z7189 Other specified counseling: Secondary | ICD-10-CM

## 2020-02-22 DIAGNOSIS — K219 Gastro-esophageal reflux disease without esophagitis: Secondary | ICD-10-CM | POA: Diagnosis present

## 2020-02-22 DIAGNOSIS — E039 Hypothyroidism, unspecified: Secondary | ICD-10-CM | POA: Diagnosis present

## 2020-02-22 DIAGNOSIS — N186 End stage renal disease: Secondary | ICD-10-CM | POA: Diagnosis not present

## 2020-02-22 DIAGNOSIS — I252 Old myocardial infarction: Secondary | ICD-10-CM

## 2020-02-22 DIAGNOSIS — F329 Major depressive disorder, single episode, unspecified: Secondary | ICD-10-CM | POA: Diagnosis present

## 2020-02-22 DIAGNOSIS — R6520 Severe sepsis without septic shock: Secondary | ICD-10-CM | POA: Diagnosis not present

## 2020-02-22 DIAGNOSIS — E118 Type 2 diabetes mellitus with unspecified complications: Secondary | ICD-10-CM

## 2020-02-22 DIAGNOSIS — R748 Abnormal levels of other serum enzymes: Secondary | ICD-10-CM | POA: Diagnosis not present

## 2020-02-22 DIAGNOSIS — E7800 Pure hypercholesterolemia, unspecified: Secondary | ICD-10-CM | POA: Diagnosis present

## 2020-02-22 DIAGNOSIS — D696 Thrombocytopenia, unspecified: Secondary | ICD-10-CM

## 2020-02-22 DIAGNOSIS — Z87891 Personal history of nicotine dependence: Secondary | ICD-10-CM

## 2020-02-22 DIAGNOSIS — J9601 Acute respiratory failure with hypoxia: Secondary | ICD-10-CM | POA: Diagnosis present

## 2020-02-22 DIAGNOSIS — Z515 Encounter for palliative care: Secondary | ICD-10-CM

## 2020-02-22 DIAGNOSIS — Z79890 Hormone replacement therapy (postmenopausal): Secondary | ICD-10-CM

## 2020-02-22 DIAGNOSIS — N184 Chronic kidney disease, stage 4 (severe): Secondary | ICD-10-CM | POA: Diagnosis present

## 2020-02-22 DIAGNOSIS — Z992 Dependence on renal dialysis: Secondary | ICD-10-CM | POA: Diagnosis not present

## 2020-02-22 DIAGNOSIS — Z85820 Personal history of malignant melanoma of skin: Secondary | ICD-10-CM

## 2020-02-22 DIAGNOSIS — R5381 Other malaise: Secondary | ICD-10-CM | POA: Diagnosis not present

## 2020-02-22 DIAGNOSIS — I5022 Chronic systolic (congestive) heart failure: Secondary | ICD-10-CM | POA: Diagnosis not present

## 2020-02-22 DIAGNOSIS — I2510 Atherosclerotic heart disease of native coronary artery without angina pectoris: Secondary | ICD-10-CM | POA: Diagnosis present

## 2020-02-22 DIAGNOSIS — Z20822 Contact with and (suspected) exposure to covid-19: Secondary | ICD-10-CM | POA: Diagnosis present

## 2020-02-22 DIAGNOSIS — I10 Essential (primary) hypertension: Secondary | ICD-10-CM | POA: Diagnosis not present

## 2020-02-22 DIAGNOSIS — R296 Repeated falls: Secondary | ICD-10-CM | POA: Diagnosis present

## 2020-02-22 DIAGNOSIS — I5043 Acute on chronic combined systolic (congestive) and diastolic (congestive) heart failure: Secondary | ICD-10-CM | POA: Diagnosis present

## 2020-02-22 DIAGNOSIS — S2249XA Multiple fractures of ribs, unspecified side, initial encounter for closed fracture: Secondary | ICD-10-CM | POA: Diagnosis not present

## 2020-02-22 DIAGNOSIS — I480 Paroxysmal atrial fibrillation: Secondary | ICD-10-CM | POA: Diagnosis present

## 2020-02-22 DIAGNOSIS — N1832 Chronic kidney disease, stage 3b: Secondary | ICD-10-CM | POA: Diagnosis not present

## 2020-02-22 DIAGNOSIS — E785 Hyperlipidemia, unspecified: Secondary | ICD-10-CM | POA: Diagnosis present

## 2020-02-22 DIAGNOSIS — N2581 Secondary hyperparathyroidism of renal origin: Secondary | ICD-10-CM | POA: Diagnosis present

## 2020-02-22 DIAGNOSIS — W19XXXA Unspecified fall, initial encounter: Secondary | ICD-10-CM | POA: Diagnosis present

## 2020-02-22 DIAGNOSIS — Z950 Presence of cardiac pacemaker: Secondary | ICD-10-CM

## 2020-02-22 LAB — CBC WITH DIFFERENTIAL/PLATELET
Abs Immature Granulocytes: 0.04 10*3/uL (ref 0.00–0.07)
Basophils Absolute: 0 10*3/uL (ref 0.0–0.1)
Basophils Relative: 0 %
Eosinophils Absolute: 0 10*3/uL (ref 0.0–0.5)
Eosinophils Relative: 0 %
HCT: 40.6 % (ref 36.0–46.0)
Hemoglobin: 12.8 g/dL (ref 12.0–15.0)
Immature Granulocytes: 0 %
Lymphocytes Relative: 2 %
Lymphs Abs: 0.2 10*3/uL — ABNORMAL LOW (ref 0.7–4.0)
MCH: 31.8 pg (ref 26.0–34.0)
MCHC: 31.5 g/dL (ref 30.0–36.0)
MCV: 101 fL — ABNORMAL HIGH (ref 80.0–100.0)
Monocytes Absolute: 0.3 10*3/uL (ref 0.1–1.0)
Monocytes Relative: 3 %
Neutro Abs: 10 10*3/uL — ABNORMAL HIGH (ref 1.7–7.7)
Neutrophils Relative %: 95 %
Platelets: 137 10*3/uL — ABNORMAL LOW (ref 150–400)
RBC: 4.02 MIL/uL (ref 3.87–5.11)
RDW: 13.8 % (ref 11.5–15.5)
WBC: 10.5 10*3/uL (ref 4.0–10.5)
nRBC: 0 % (ref 0.0–0.2)

## 2020-02-22 LAB — PROTIME-INR
INR: 1.8 — ABNORMAL HIGH (ref 0.8–1.2)
Prothrombin Time: 20.9 seconds — ABNORMAL HIGH (ref 11.4–15.2)

## 2020-02-22 LAB — POC SARS CORONAVIRUS 2 AG -  ED: SARS Coronavirus 2 Ag: NEGATIVE

## 2020-02-22 LAB — URINALYSIS, ROUTINE W REFLEX MICROSCOPIC
Bilirubin Urine: NEGATIVE
Glucose, UA: 50 mg/dL — AB
Ketones, ur: NEGATIVE mg/dL
Nitrite: POSITIVE — AB
Protein, ur: 100 mg/dL — AB
Specific Gravity, Urine: 1.02 (ref 1.005–1.030)
WBC, UA: >50 WBC/hpf — ABNORMAL HIGH (ref 0–5)
pH: 5 (ref 5.0–8.0)

## 2020-02-22 LAB — CBG MONITORING, ED: Glucose-Capillary: 267 mg/dL — ABNORMAL HIGH (ref 70–99)

## 2020-02-22 LAB — COMPREHENSIVE METABOLIC PANEL
ALT: 37 U/L (ref 0–44)
AST: 57 U/L — ABNORMAL HIGH (ref 15–41)
Albumin: 3.5 g/dL (ref 3.5–5.0)
Alkaline Phosphatase: 109 U/L (ref 38–126)
Anion gap: 10 (ref 5–15)
BUN: 30 mg/dL — ABNORMAL HIGH (ref 8–23)
CO2: 20 mmol/L — ABNORMAL LOW (ref 22–32)
Calcium: 9 mg/dL (ref 8.9–10.3)
Chloride: 105 mmol/L (ref 98–111)
Creatinine, Ser: 2.13 mg/dL — ABNORMAL HIGH (ref 0.44–1.00)
GFR calc Af Amer: 24 mL/min — ABNORMAL LOW (ref >60)
GFR calc non Af Amer: 20 mL/min — ABNORMAL LOW (ref >60)
Glucose, Bld: 265 mg/dL — ABNORMAL HIGH (ref 70–99)
Potassium: 4.7 mmol/L (ref 3.5–5.1)
Sodium: 135 mmol/L (ref 135–145)
Total Bilirubin: 0.8 mg/dL (ref 0.3–1.2)
Total Protein: 6.4 g/dL — ABNORMAL LOW (ref 6.5–8.1)

## 2020-02-22 LAB — LACTIC ACID, PLASMA
Lactic Acid, Venous: 2 mmol/L (ref 0.5–1.9)
Lactic Acid, Venous: 2.6 mmol/L (ref 0.5–1.9)

## 2020-02-22 LAB — APTT: aPTT: 37 seconds — ABNORMAL HIGH (ref 24–36)

## 2020-02-22 MED ORDER — OXYCODONE HCL 5 MG PO TABS
5.0000 mg | ORAL_TABLET | Freq: Four times a day (QID) | ORAL | Status: DC | PRN
  Administered 2020-02-24 – 2020-02-27 (×3): 5 mg via ORAL
  Filled 2020-02-22 (×3): qty 1

## 2020-02-22 MED ORDER — INSULIN GLARGINE 100 UNIT/ML ~~LOC~~ SOLN
55.0000 [IU] | Freq: Every day | SUBCUTANEOUS | Status: DC
  Administered 2020-02-23: 10:00:00 55 [IU] via SUBCUTANEOUS
  Filled 2020-02-22 (×2): qty 0.55

## 2020-02-22 MED ORDER — RIVAROXABAN 15 MG PO TABS
15.0000 mg | ORAL_TABLET | Freq: Every day | ORAL | Status: DC
  Administered 2020-02-23 – 2020-02-27 (×5): 15 mg via ORAL
  Filled 2020-02-22 (×6): qty 1

## 2020-02-22 MED ORDER — FUROSEMIDE 20 MG PO TABS
20.0000 mg | ORAL_TABLET | Freq: Every day | ORAL | Status: DC
  Administered 2020-02-23 – 2020-02-24 (×2): 20 mg via ORAL
  Filled 2020-02-22 (×2): qty 1

## 2020-02-22 MED ORDER — ACETAMINOPHEN 325 MG PO TABS
650.0000 mg | ORAL_TABLET | Freq: Once | ORAL | Status: AC
  Administered 2020-02-22: 650 mg via ORAL
  Filled 2020-02-22: qty 2

## 2020-02-22 MED ORDER — AMIODARONE HCL 200 MG PO TABS
200.0000 mg | ORAL_TABLET | Freq: Every day | ORAL | Status: DC
  Administered 2020-02-23 – 2020-02-27 (×5): 200 mg via ORAL
  Filled 2020-02-22 (×5): qty 1

## 2020-02-22 MED ORDER — SODIUM CHLORIDE 0.9 % IV SOLN
2.0000 g | INTRAVENOUS | Status: AC
  Administered 2020-02-22 – 2020-02-26 (×5): 2 g via INTRAVENOUS
  Filled 2020-02-22 (×4): qty 2
  Filled 2020-02-22: qty 20

## 2020-02-22 MED ORDER — LACTATED RINGERS IV BOLUS (SEPSIS)
1000.0000 mL | Freq: Once | INTRAVENOUS | Status: AC
  Administered 2020-02-22: 1000 mL via INTRAVENOUS

## 2020-02-22 MED ORDER — LACTATED RINGERS IV BOLUS (SEPSIS)
1000.0000 mL | Freq: Once | INTRAVENOUS | Status: AC
  Administered 2020-02-22: 19:00:00 1000 mL via INTRAVENOUS

## 2020-02-22 MED ORDER — ISOSORBIDE MONONITRATE ER 30 MG PO TB24
30.0000 mg | ORAL_TABLET | Freq: Every day | ORAL | Status: DC
  Administered 2020-02-23 – 2020-02-27 (×5): 30 mg via ORAL
  Filled 2020-02-22 (×5): qty 1

## 2020-02-22 MED ORDER — VENLAFAXINE HCL 75 MG PO TABS
75.0000 mg | ORAL_TABLET | Freq: Every day | ORAL | Status: DC
  Administered 2020-02-24 – 2020-02-27 (×4): 75 mg via ORAL
  Filled 2020-02-22 (×6): qty 1

## 2020-02-22 MED ORDER — SODIUM CHLORIDE 0.9 % IV SOLN
1000.0000 mL | INTRAVENOUS | Status: DC
  Administered 2020-02-22: 1000 mL via INTRAVENOUS

## 2020-02-22 MED ORDER — PRAVASTATIN SODIUM 40 MG PO TABS
80.0000 mg | ORAL_TABLET | Freq: Every day | ORAL | Status: DC
  Administered 2020-02-23 – 2020-02-24 (×2): 80 mg via ORAL
  Filled 2020-02-22 (×2): qty 2

## 2020-02-22 MED ORDER — BENZONATATE 100 MG PO CAPS: 100.0000 mg | ORAL_CAPSULE | Freq: Three times a day (TID) | ORAL | Status: DC | PRN

## 2020-02-22 MED ORDER — ACETAMINOPHEN 500 MG PO TABS
1000.0000 mg | ORAL_TABLET | Freq: Four times a day (QID) | ORAL | Status: DC | PRN
  Filled 2020-02-22: qty 2

## 2020-02-22 MED ORDER — METOPROLOL SUCCINATE ER 100 MG PO TB24
100.0000 mg | ORAL_TABLET | Freq: Every day | ORAL | Status: DC
  Administered 2020-02-23 – 2020-02-27 (×5): 100 mg via ORAL
  Filled 2020-02-22 (×5): qty 1

## 2020-02-22 MED ORDER — SODIUM CHLORIDE 0.9 % IV SOLN
500.0000 mg | INTRAVENOUS | Status: AC
  Administered 2020-02-22 – 2020-02-26 (×5): 500 mg via INTRAVENOUS
  Filled 2020-02-22 (×5): qty 500

## 2020-02-22 MED ORDER — LEVOTHYROXINE SODIUM 50 MCG PO TABS
50.0000 ug | ORAL_TABLET | Freq: Every day | ORAL | Status: DC
  Administered 2020-02-23 – 2020-02-27 (×5): 50 ug via ORAL
  Filled 2020-02-22 (×4): qty 1

## 2020-02-22 MED ORDER — IPRATROPIUM BROMIDE HFA 17 MCG/ACT IN AERS
2.0000 | INHALATION_SPRAY | Freq: Four times a day (QID) | RESPIRATORY_TRACT | Status: DC | PRN
  Filled 2020-02-22: qty 12.9

## 2020-02-22 NOTE — ED Notes (Signed)
Attempted to call report to [id number]. 

## 2020-02-22 NOTE — ED Provider Notes (Signed)
[facility] EMERGENCY DEPARTMENT Provider Note   CSN: [id number] Arrival date & time: 02/22/20  1601     History Chief Complaint  Patient presents with  . Fall  . Altered Mental Status  . Fever    Shannon Lucas is a 86 y.o. female.  86 year old female here with altered mental status.  According to EMS, patient has been weak throughout the day and with family went away to go shopping when he came back she was found to be on the ground.  Unsure of how she got there.  EMS called and patient's temperature was 103 degrees.  Was transported here for further management.  No further history available due to her current state        Past Medical History:  Diagnosis Date  . A-fib ([place])   . Arthritis    "a little bit qwhere" (02/28/2017)  . CHF (congestive heart failure) ([place])   . CKD (chronic kidney disease), stage III    [name] 02/28/2017  . Depression   . GERD (gastroesophageal reflux disease)   . High cholesterol   . Hypertension   . Melanoma of back ([place])   . Myocardial infarction [facility])    "one dr said I'd had 1" (02/28/2017)  . Stroke [facility]) ~ 2010; ~2012   "affected the right side but I fully recovered; just a light one" (02/28/2017)  . Type II diabetes mellitus [facility])     Patient Active Problem List   Diagnosis Date Noted  . Tachy-brady syndrome ([place]) 11/20/2017  . Chronic systolic heart failure ([place]) 03/15/2017  . Pseudoaneurysm following procedure ([place])   . Acute systolic congestive heart failure ([place])   . AKI (acute kidney injury) ([place])   . CHF exacerbation ([place]) 03/01/2017  . Fall   . Anemia   . CKD (chronic kidney disease) stage 3, GFR 30-59 ml/min   . Diabetes mellitus type II, controlled ([place])   . Paroxysmal atrial fibrillation (HCC)   . Chronic anticoagulation   . Pure hypercholesterolemia   . Shortness of breath 02/28/2017    Past Surgical History:  Procedure Laterality Date  . APPENDECTOMY    . FALSE ANEURYSM REPAIR Right 03/08/2017   Procedure: REPAIR RIGHT RADIAL FALSE ANEURYSM;  Surgeon: Rosetta Posner, MD;  Location: [facility];  Service: Vascular;  Laterality: Right;  . KNEE SURGERY Left 1970s   "I have 1/3 of my knee left in there"  . LAPAROSCOPIC CHOLECYSTECTOMY    . MELANOMA EXCISION     "back"  . PACEMAKER IMPLANT N/A 11/20/2017   Procedure: PACEMAKER IMPLANT;  Surgeon: Constance Haw, MD;  Location: [place] CV LAB;  Service: Cardiovascular;  Laterality: N/A;  . RIGHT/LEFT HEART CATH AND CORONARY ANGIOGRAPHY N/A 03/06/2017   Procedure: Right/Left Heart Cath and Coronary Angiography;  Surgeon: Larey Dresser, MD;  Location: [facility] CV LAB;  Service: Cardiovascular;  Laterality: N/A;  . TUBAL LIGATION       OB History   No obstetric history on file.     Family History  Problem Relation Age of Onset  . Diabetes Mother   . Stroke Father   . Parkinson's disease Brother     Social History   Tobacco Use  . Smoking status: Former Research scientist (life sciences)  . Smokeless tobacco: Never Used  . Tobacco comment: 02/28/2017 "only smoked in the 1960s when we went out"  Substance Use Topics  . Alcohol use: No  . Drug use: No    Home Medications Prior to  Admission medications   Medication Sig Start Date End Date Taking? Authorizing Provider  acetaminophen (TYLENOL) 500 MG tablet Take 1,000 mg by mouth every 6 (six) hours as needed for moderate pain or headache.    [provider]  amiodarone (PACERONE) 200 MG tablet TAKE 1 TABLET BY MOUTH DAIY 11/05/19   Bensimhon, Shaune Pascal, MD  benzonatate (TESSALON) 100 MG capsule Take 100 mg by mouth 3 (three) times daily as needed for cough.    [provider]  ergocalciferol (VITAMIN D2) 50000 units capsule Take 50,000 Units by mouth every Friday. At night    [provider]  ferrous sulfate 325 (65 FE) MG tablet Take 1 tablet (325 mg total) by mouth 3 (three) times daily with meals. 03/19/19   Larey Dresser, MD  furosemide (LASIX) 40 MG tablet Take 0.5  tablets (20 mg total) by mouth daily. Please call for office visit [phone] 01/07/20   Larey Dresser, MD  ipratropium (ATROVENT HFA) 17 MCG/ACT inhaler Inhale 2 puffs into the lungs every 6 (six) hours as needed for wheezing. 09/06/17   Larey Dresser, MD  isosorbide mononitrate (IMDUR) 30 MG 24 hr tablet Take 1 tablet (30 mg total) by mouth daily. 03/19/19   Larey Dresser, MD  LANTUS SOLOSTAR 100 UNIT/ML Solostar Pen Inject 50 Units into the skin daily.  02/24/17   [provider]  metoprolol succinate (TOPROL-XL) 100 MG 24 hr tablet TAKE 1 TABLET BY MOUTH DAILY TAKE WITH OR IMMDIATELY FOLLOWING A MEAL 02/04/19   Larey Dresser, MD  pravastatin (PRAVACHOL) 80 MG tablet Take 1 tablet (80 mg total) by mouth at bedtime. 03/19/19   Larey Dresser, MD  sacubitril-valsartan (ENTRESTO) 49-51 MG Take 1 tablet by mouth 2 (two) times daily. 02/13/20   Sande Rives E, PA-C  spironolactone (ALDACTONE) 25 MG tablet TAKE 1/2 TABLET BY MOUTH DAILY 09/18/19   Larey Dresser, MD  venlafaxine [facility]) 75 MG tablet Take 75 mg by mouth daily.  02/03/17   [provider]  XARELTO 15 MG TABS tablet TAKE 1 TABLET BY MOUTH DAILY WITH SUPPER 03/19/19   Larey Dresser, MD    Allergies    Patient has no known allergies.  Review of Systems   Review of Systems  Unable to perform ROS: Mental status change    Physical Exam Updated Vital Signs Pulse 75   Temp (!) 103.2 F (39.6 C) (Oral)   Resp (!) 22   SpO2 (!) 79%   Physical Exam Vitals and nursing note reviewed.  Constitutional:      General: She is not in acute distress.    Appearance: Normal appearance. She is well-developed. She is not toxic-appearing.  HENT:     Head: Normocephalic and atraumatic.  Eyes:     General: Lids are normal.     Conjunctiva/sclera: Conjunctivae normal.     Pupils: Pupils are equal, round, and reactive to light.  Neck:     Thyroid: No thyroid mass.     Trachea: No tracheal deviation.    Cardiovascular:     Rate and Rhythm: Normal rate and regular rhythm.     Heart sounds: Normal heart sounds. No murmur. No gallop.   Pulmonary:     Effort: Pulmonary effort is normal. No respiratory distress.     Breath sounds: Normal breath sounds. No stridor. No decreased breath sounds, wheezing, rhonchi or rales.  Abdominal:     General: Bowel sounds are normal. There is no  distension.     Palpations: Abdomen is soft.     Tenderness: There is no abdominal tenderness. There is no rebound.  Musculoskeletal:        General: No tenderness. Normal range of motion.     Cervical back: Full passive range of motion without pain, normal range of motion and neck supple.  Skin:    General: Skin is warm and dry.     Findings: No abrasion or rash.  Neurological:     Mental Status: She is alert. She is disoriented.     GCS: GCS eye subscore is 4. GCS verbal subscore is 4. GCS motor subscore is 6.     Cranial Nerves: No cranial nerve deficit.     Sensory: No sensory deficit.     Motor: No tremor.  Psychiatric:        Attention and Perception: She is inattentive.        Speech: Speech is delayed.     ED Results / Procedures / Treatments   Labs (all labs ordered are listed, but only abnormal results are displayed) Labs Reviewed  CBG MONITORING, ED - Abnormal; Notable for the following components:      Result Value   Glucose-Capillary 267 (*)    All other components within normal limits  CULTURE, BLOOD (ROUTINE X 2)  CULTURE, BLOOD (ROUTINE X 2)  URINE CULTURE  LACTIC ACID, PLASMA  LACTIC ACID, PLASMA  COMPREHENSIVE METABOLIC PANEL  CBC WITH DIFFERENTIAL/PLATELET  APTT  PROTIME-INR  URINALYSIS, ROUTINE W REFLEX MICROSCOPIC  POC SARS CORONAVIRUS 2 AG -  ED    EKG EKG Interpretation  Date/Time:  Saturday February 22 2020 16:22:13 EDT Ventricular Rate:  71 PR Interval:    QRS Duration: 100 QT Interval:  430 QTC Calculation: 468 R Axis:   -27 Text Interpretation: Atrial  fibrillation Paired ventricular premature complexes Borderline left axis deviation Abnormal R-wave progression, late transition Abnormal T, consider ischemia, lateral leads Confirmed by Lacretia Leigh ([id number]) on 02/22/2020 4:28:20 PM   Radiology No results found.  Procedures Procedures (including critical care time)  Medications Ordered in ED Medications  0.9 %  sodium chloride infusion (has no administration in time range)  acetaminophen (TYLENOL) tablet 650 mg (has no administration in time range)    ED Course  I have reviewed the triage vital signs and the nursing notes.  Pertinent labs & imaging results that were available during my care of the patient were reviewed by me and considered in my medical decision making (see chart for details).    MDM Rules/Calculators/A&P                      Chest x-ray suspicious for pneumonia.  Her fever was treated with Tylenol.  Started on IV antibiotics.  Lactate elevated.  Given fluid bolus for likely sepsis.  Blood cultures obtained.  Rapid Covid negative.  Will send PCR.  Head CT negative for bleed which is done because she had fallen.  Will admit to the hospital for further management  CRITICAL CARE Performed by: Leota Jacobsen Total critical care time: 45 minutes Critical care time was exclusive of separately billable procedures and treating other patients. Critical care was necessary to treat or prevent imminent or life-threatening deterioration. Critical care was time spent personally by me on the following activities: development of treatment plan with patient and/or surrogate as well as nursing, discussions with consultants, evaluation of patient's response to treatment, examination of patient, obtaining history from  patient or surrogate, ordering and performing treatments and interventions, ordering and review of laboratory studies, ordering and review of radiographic studies, pulse oximetry and re-evaluation of patient's  condition.  Final Clinical Impression(s) / ED Diagnoses Final diagnoses:  None    Rx / DC Orders ED Discharge Orders    None       Lacretia Leigh, MD 02/22/20 1842

## 2020-02-22 NOTE — ED Notes (Signed)
Pt back from CT

## 2020-02-22 NOTE — H&P (Addendum)
History and Physical    Shannon Lucas SEG:[id number] DOB: 05/30/1931 DOA: 02/22/2020  PCP: Manfred Shirts, PA  Patient coming from: Home  I have personally briefly reviewed patient's old medical records in [place]  Chief Complaint: found down  HPI: Shannon Lucas is a 86 y.o. female with medical history significant of DM, CKD, a. Fib, CHF and recent UTI. Felt weak today and states she tripped and fell. She has broken ribs. Brought to Ed with AMS and found to have UTI, pneumonia. Temp elevated to 103 degrees. ED Course: labs and x-rays done and are as noted. Also with elevated lactic acid, which was treated with 3L LR and IV Abx, Azithromycin and Ceftriaxone.   Review of Systems: As per HPI otherwise 10 point review of systems negative. Reports rib pain. Does not have chest pain, or SOB. No LE swelling. No abdominal pain.  Past Medical History:  Diagnosis Date  . A-fib ([place])   . Arthritis    "a little bit qwhere" (02/28/2017)  . CHF (congestive heart failure) ([place])   . CKD (chronic kidney disease), stage III    [name] 02/28/2017  . Depression   . GERD (gastroesophageal reflux disease)   . High cholesterol   . Hypertension   . Melanoma of back ([place])   . Myocardial infarction [facility])    "one dr said I'd had 1" (02/28/2017)  . Stroke [facility]) ~ 2010; ~2012   "affected the right side but I fully recovered; just a light one" (02/28/2017)  . Type II diabetes mellitus ([place])     Past Surgical History:  Procedure Laterality Date  . APPENDECTOMY    . FALSE ANEURYSM REPAIR Right 03/08/2017   Procedure: REPAIR RIGHT RADIAL FALSE ANEURYSM;  Surgeon: Rosetta Posner, MD;  Location: [facility];  Service: Vascular;  Laterality: Right;  . KNEE SURGERY Left 1970s   "I have 1/3 of my knee left in there"  . LAPAROSCOPIC CHOLECYSTECTOMY    . MELANOMA EXCISION     "back"  . PACEMAKER IMPLANT N/A 11/20/2017   Procedure: PACEMAKER IMPLANT;  Surgeon: Constance Haw, MD;  Location: [facility] CV LAB;   Service: Cardiovascular;  Laterality: N/A;  . RIGHT/LEFT HEART CATH AND CORONARY ANGIOGRAPHY N/A 03/06/2017   Procedure: Right/Left Heart Cath and Coronary Angiography;  Surgeon: Larey Dresser, MD;  Location: [facility] CV LAB;  Service: Cardiovascular;  Laterality: N/A;  . TUBAL LIGATION       reports that she has quit smoking. She has never used smokeless tobacco. She reports that she does not drink alcohol or use drugs.  No Known Allergies  Family History  Problem Relation Age of Onset  . Diabetes Mother   . Stroke Father   . Parkinson's disease Brother     Prior to Admission medications   Medication Sig Start Date End Date Taking? Authorizing Provider  acetaminophen (TYLENOL) 500 MG tablet Take 1,000 mg by mouth every 6 (six) hours as needed for moderate pain or headache.   Yes [provider]  amiodarone (PACERONE) 200 MG tablet TAKE 1 TABLET BY MOUTH [place] Patient taking differently: Take 200 mg by mouth daily.  11/05/19  Yes Bensimhon, Shaune Pascal, MD  benzonatate (TESSALON) 100 MG capsule Take 100 mg by mouth 3 (three) times daily as needed for cough.   Yes [provider]  ergocalciferol (VITAMIN D2) 50000 units capsule Take 50,000 Units by mouth every Friday. At night   Yes [provider]  ferrous sulfate  325 (65 FE) MG tablet Take 1 tablet (325 mg total) by mouth 3 (three) times daily with meals. 03/19/19  Yes Larey Dresser, MD  furosemide (LASIX) 40 MG tablet Take 0.5 tablets (20 mg total) by mouth daily. Please call for office visit [phone] 01/07/20  Yes Larey Dresser, MD  ipratropium (ATROVENT HFA) 17 MCG/ACT inhaler Inhale 2 puffs into the lungs every 6 (six) hours as needed for wheezing. 09/06/17  Yes Larey Dresser, MD  isosorbide mononitrate (IMDUR) 30 MG 24 hr tablet Take 1 tablet (30 mg total) by mouth daily. 03/19/19  Yes Larey Dresser, MD  LANTUS SOLOSTAR 100 UNIT/ML Solostar Pen Inject 55 Units into the skin daily.  02/24/17  Yes  [provider]  levothyroxine (SYNTHROID) 50 MCG tablet Take 50 mcg by mouth daily. 01/29/20  Yes [provider]  metoprolol succinate (TOPROL-XL) 100 MG 24 hr tablet TAKE 1 TABLET BY MOUTH DAILY TAKE WITH OR IMMDIATELY FOLLOWING A MEAL Patient taking differently: Take 100 mg by mouth daily.  02/04/19  Yes Larey Dresser, MD  nitrofurantoin, macrocrystal-monohydrate, (MACROBID) 100 MG capsule Take 100 mg by mouth 2 (two) times daily. 02/21/20  Yes [provider]  oxyCODONE (OXY IR/ROXICODONE) 5 MG immediate release tablet Take 5 mg by mouth every 6 (six) hours as needed for pain. 02/21/20  Yes [provider]  pravastatin (PRAVACHOL) 80 MG tablet Take 1 tablet (80 mg total) by mouth at bedtime. 03/19/19  Yes Larey Dresser, MD  sacubitril-valsartan (ENTRESTO) 49-51 MG Take 1 tablet by mouth 2 (two) times daily. 02/13/20  Yes Sande Rives E, PA-C  spironolactone (ALDACTONE) 25 MG tablet TAKE 1/2 TABLET BY MOUTH DAILY Patient taking differently: Take 12.5 mg by mouth daily.  09/18/19  Yes Larey Dresser, MD  venlafaxine (EFFEXOR) 75 MG tablet Take 75 mg by mouth daily.  02/03/17  Yes [provider]  XARELTO 15 MG TABS tablet TAKE 1 TABLET BY MOUTH DAILY WITH SUPPER Patient taking differently: Take 15 mg by mouth daily with supper.  03/19/19  Yes Larey Dresser, MD    Physical Exam: Vitals:   02/22/20 1902 02/22/20 2124 02/22/20 2129 02/22/20 2130  BP: (!) 107/44   (!) 127/56  Pulse: 61  61   Resp: 18  (!) 22   Temp:  98.5 F (36.9 C)    TempSrc:  Oral    SpO2: 95%  100%   Weight:      Height:        Constitutional: NAD, calm, comfortable Vitals:   02/22/20 1902 02/22/20 2124 02/22/20 2129 02/22/20 2130  BP: (!) 107/44   (!) 127/56  Pulse: 61  61   Resp: 18  (!) 22   Temp:  98.5 F (36.9 C)    TempSrc:  Oral    SpO2: 95%  100%   Weight:      Height:       Eyes: PERRL, lids and conjunctivae normal ENMT: Mucous membranes are  moist.. Neck: normal, supple, no masses, no thyromegaly Respiratory: bilateral soft rales, no wheezing Normal respiratory effort. No accessory muscle use.  Cardiovascular: Irregularly irregular rate, distant heart sounds Abdomen: no tenderness, no masses palpated. No hepatosplenomegaly. Bowel sounds positive.  Musculoskeletal: no clubbing / cyanosis. No joint deformity upper and lower extremities. Good ROM, no contractures. Normal muscle tone.  Skin: no rashes, ulcers. No induration Neurologic: CN 2-12 grossly intact. Sensation intact Psychiatric: Normal judgment and insight. Alert and oriented x 3.  Normal mood.   Labs on Admission: I have personally reviewed following labs and imaging studies  CBC: Recent Labs  Lab 02/22/20 1714  WBC 10.5  NEUTROABS 10.0*  HGB 12.8  HCT 40.6  MCV 101.0*  PLT 413*   Basic Metabolic Panel: Recent Labs  Lab 02/22/20 1714  NA 135  K 4.7  CL 105  CO2 20*  GLUCOSE 265*  BUN 30*  CREATININE 2.13*  CALCIUM 9.0   GFR: Estimated Creatinine Clearance: 21.9 mL/min (A) (by C-G formula based on SCr of 2.13 mg/dL (H)). Liver Function Tests: Recent Labs  Lab 02/22/20 1714  AST 57*  ALT 37  ALKPHOS 109  BILITOT 0.8  PROT 6.4*  ALBUMIN 3.5   Coagulation Profile: Recent Labs  Lab 02/22/20 1714  INR 1.8*   CBG: Recent Labs  Lab 02/22/20 1620  GLUCAP 267*   Urine analysis:    Component Value Date/Time   COLORURINE AMBER (A) 02/22/2020 1833   APPEARANCEUR HAZY (A) 02/22/2020 1833   LABSPEC 1.020 02/22/2020 1833   PHURINE 5.0 02/22/2020 1833   GLUCOSEU 50 (A) 02/22/2020 1833   HGBUR MODERATE (A) 02/22/2020 1833   BILIRUBINUR NEGATIVE 02/22/2020 1833   KETONESUR NEGATIVE 02/22/2020 1833   PROTEINUR 100 (A) 02/22/2020 1833   NITRITE POSITIVE (A) 02/22/2020 1833   LEUKOCYTESUR LARGE (A) 02/22/2020 1833    Radiological Exams on Admission: CT Head Wo Contrast  Result Date: 02/22/2020 CLINICAL DATA:  Fall, found down EXAM: CT HEAD  WITHOUT CONTRAST TECHNIQUE: Contiguous axial images were obtained from the base of the skull through the vertex without intravenous contrast. COMPARISON:  03/21/2019 FINDINGS: Brain: No evidence of acute infarction, hemorrhage, hydrocephalus, extra-axial collection or mass lesion/mass effect. Mild cortical atrophy. Subcortical white matter and periventricular small vessel ischemic changes. 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 Vascular: Mild intracranial atherosclerosis. Skull: Normal. Negative for fracture or focal lesion. Sinuses/Orbits: The visualized paranasal sinuses are essentially clear. The mastoid air cells are unopacified. Other: None. IMPRESSION: No evidence of acute intracranial abnormality. Atrophy with small vessel ischemic changes. 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 Electronically Signed   By: Julian Hy M.D.   On: 02/22/2020 18:24   DG Chest Port 1 View  Result Date: 02/22/2020 CLINICAL DATA:  Confusion. EXAM: PORTABLE CHEST 1 VIEW COMPARISON:  None. FINDINGS: The right hilum is mildly prominent, possibly due to positioning. The heart, left hilum, and mediastinum are normal. Bilateral interstitial opacities. More patchy opacity on the right. No other acute abnormalities. IMPRESSION: 1. Bilateral interstitial opacities may represent edema or atypical infection. 2. Opacity is more patchy with a few more focal regions of opacity on the right. This could represent asymmetric edema versus developing pneumonia. Electronically Signed   By: Dorise Bullion III M.D   On: 02/22/2020 16:50    EKG: Independently reviewed. Shows ? Lateral ischemia, a. Fib, PVCs  Assessment/Plan Active Problems:   Fall   CKD (chronic kidney disease) stage 3, GFR 30-59 ml/min   Diabetes mellitus type II, controlled (HCC)   Paroxysmal atrial fibrillation (HCC)   Chronic anticoagulation   Pure hypercholesterolemia   Chronic systolic heart failure (HCC)   Tachy-brady syndrome (HCC)   Sepsis ([place])    Hyperparathyroidism due to renal insufficiency (HCC)   Essential hypertension   Thrombocytopenia (HCC)   Pneumonia  Sepsis - ? Pneumonia vs. Urosepsis. Urine is dirty, could this be some CHF or ARDS or is it pneumonia. Cultures pending. On Azithromycin and Rocphin (which should cover any urinary pathogens. Lactate rose  despite 3L of fluid in pt. With h/o CHF--repeat again. Maintenance fluids given.  CKD - no nephrotoxins. Saw nephrology as outpt. Continue to monitor. Holding Entresto and Spirinolactone until improves.  DM Type 2, continue home meds, monitor CBGs.  Paroxysmal A. Fib with Tachy/brady syndrome - continue rate control, amiodarone and toprol, Xarelto (ok per pharmacy despite CKD), has pace maker in  Dyslipidemia- continue pravachol  Chronic CHF - check BNP, monitor I/Os, continue Imdur and lasix for now. Last ECHO 12/18/2018 with EF of 60-65%  Hypertension - On entresto, Spirinolactone, Metoprolol, Imdur at home, holding Entresto and Spirinolactone for now due to sepsis and increasing serum Cr.  Fall - has fallen several times at home. Will get PT eval while here.  DVT prophylaxis: [id number]  Code Status: Full code discussed with her, she wants to review with her PCP Family Communication: none available tonight  Disposition Plan: home  Consults called: none Admission status: Inpatient   Donnamae Jude MD [phone] [facility]  If 7PM-7AM, please contact night-coverage 02/22/2020, 10:26 PM

## 2020-02-22 NOTE — ED Triage Notes (Signed)
Pt fell yesterday and broke two ribs. At [facility] they also noted a UTI. D/c with macrobid. This morning patient more irritable than usual, but this afternoon family returned to find her lying on the floor, "eyes rolling around, arms flailing around," mumbling, garbled speech. No obvious injury from fall today. Febrile at 103.1 per EMS. Normally A/O x 4, ambulatory.

## 2020-02-23 ENCOUNTER — Inpatient Hospital Stay (HOSPITAL_COMMUNITY): Payer: Medicare Other

## 2020-02-23 DIAGNOSIS — N184 Chronic kidney disease, stage 4 (severe): Secondary | ICD-10-CM | POA: Diagnosis not present

## 2020-02-23 DIAGNOSIS — I5022 Chronic systolic (congestive) heart failure: Secondary | ICD-10-CM | POA: Diagnosis not present

## 2020-02-23 DIAGNOSIS — J189 Pneumonia, unspecified organism: Secondary | ICD-10-CM | POA: Diagnosis not present

## 2020-02-23 DIAGNOSIS — Z515 Encounter for palliative care: Secondary | ICD-10-CM | POA: Diagnosis not present

## 2020-02-23 DIAGNOSIS — Z7189 Other specified counseling: Secondary | ICD-10-CM | POA: Diagnosis not present

## 2020-02-23 DIAGNOSIS — R7989 Other specified abnormal findings of blood chemistry: Secondary | ICD-10-CM

## 2020-02-23 DIAGNOSIS — Z7901 Long term (current) use of anticoagulants: Secondary | ICD-10-CM | POA: Diagnosis not present

## 2020-02-23 LAB — CBC WITH DIFFERENTIAL/PLATELET
Abs Immature Granulocytes: 0.03 10*3/uL (ref 0.00–0.07)
Abs Immature Granulocytes: 0.05 10*3/uL (ref 0.00–0.07)
Basophils Absolute: 0 10*3/uL (ref 0.0–0.1)
Basophils Absolute: 0 10*3/uL (ref 0.0–0.1)
Basophils Relative: 0 %
Basophils Relative: 0 %
Eosinophils Absolute: 0 10*3/uL (ref 0.0–0.5)
Eosinophils Absolute: 0 10*3/uL (ref 0.0–0.5)
Eosinophils Relative: 0 %
Eosinophils Relative: 0 %
HCT: 38.2 % (ref 36.0–46.0)
HCT: 37.3 % (ref 36.0–46.0)
Hemoglobin: 12.1 g/dL (ref 12.0–15.0)
Hemoglobin: 11.8 g/dL — ABNORMAL LOW (ref 12.0–15.0)
Immature Granulocytes: 0 %
Immature Granulocytes: 1 %
Lymphocytes Relative: 1 %
Lymphocytes Relative: 4 %
Lymphs Abs: 0.1 10*3/uL — ABNORMAL LOW (ref 0.7–4.0)
Lymphs Abs: 0.3 10*3/uL — ABNORMAL LOW (ref 0.7–4.0)
MCH: 32.4 pg (ref 26.0–34.0)
MCH: 31.9 pg (ref 26.0–34.0)
MCHC: 31.7 g/dL (ref 30.0–36.0)
MCHC: 31.6 g/dL (ref 30.0–36.0)
MCV: 102.1 fL — ABNORMAL HIGH (ref 80.0–100.0)
MCV: 100.8 fL — ABNORMAL HIGH (ref 80.0–100.0)
Monocytes Absolute: 0.3 10*3/uL (ref 0.1–1.0)
Monocytes Absolute: 0.3 10*3/uL (ref 0.1–1.0)
Monocytes Relative: 3 %
Monocytes Relative: 3 %
Neutro Abs: 8.7 10*3/uL — ABNORMAL HIGH (ref 1.7–7.7)
Neutro Abs: 7.9 10*3/uL — ABNORMAL HIGH (ref 1.7–7.7)
Neutrophils Relative %: 96 %
Neutrophils Relative %: 92 %
Platelets: 125 10*3/uL — ABNORMAL LOW (ref 150–400)
Platelets: 102 10*3/uL — ABNORMAL LOW (ref 150–400)
RBC: 3.74 MIL/uL — ABNORMAL LOW (ref 3.87–5.11)
RBC: 3.7 MIL/uL — ABNORMAL LOW (ref 3.87–5.11)
RDW: 14 % (ref 11.5–15.5)
RDW: 14 % (ref 11.5–15.5)
WBC: 9.1 10*3/uL (ref 4.0–10.5)
WBC: 8.6 10*3/uL (ref 4.0–10.5)
nRBC: 0 % (ref 0.0–0.2)
nRBC: 0 % (ref 0.0–0.2)

## 2020-02-23 LAB — COMPREHENSIVE METABOLIC PANEL
ALT: 161 U/L — ABNORMAL HIGH (ref 0–44)
AST: 233 U/L — ABNORMAL HIGH (ref 15–41)
Albumin: 3 g/dL — ABNORMAL LOW (ref 3.5–5.0)
Alkaline Phosphatase: 132 U/L — ABNORMAL HIGH (ref 38–126)
Anion gap: 11 (ref 5–15)
BUN: 32 mg/dL — ABNORMAL HIGH (ref 8–23)
CO2: 20 mmol/L — ABNORMAL LOW (ref 22–32)
Calcium: 8.4 mg/dL — ABNORMAL LOW (ref 8.9–10.3)
Chloride: 106 mmol/L (ref 98–111)
Creatinine, Ser: 2.01 mg/dL — ABNORMAL HIGH (ref 0.44–1.00)
GFR calc Af Amer: 25 mL/min — ABNORMAL LOW (ref >60)
GFR calc non Af Amer: 22 mL/min — ABNORMAL LOW (ref >60)
Glucose, Bld: 189 mg/dL — ABNORMAL HIGH (ref 70–99)
Potassium: 4.4 mmol/L (ref 3.5–5.1)
Sodium: 137 mmol/L (ref 135–145)
Total Bilirubin: 1.1 mg/dL (ref 0.3–1.2)
Total Protein: 5.8 g/dL — ABNORMAL LOW (ref 6.5–8.1)

## 2020-02-23 LAB — GLUCOSE, CAPILLARY
Glucose-Capillary: 112 mg/dL — ABNORMAL HIGH (ref 70–99)
Glucose-Capillary: 136 mg/dL — ABNORMAL HIGH (ref 70–99)
Glucose-Capillary: 141 mg/dL — ABNORMAL HIGH (ref 70–99)
Glucose-Capillary: 167 mg/dL — ABNORMAL HIGH (ref 70–99)
Glucose-Capillary: 190 mg/dL — ABNORMAL HIGH (ref 70–99)

## 2020-02-23 LAB — RESPIRATORY PANEL BY PCR

## 2020-02-23 LAB — PROCALCITONIN: Procalcitonin: 32.23 ng/mL

## 2020-02-23 LAB — URINE CULTURE: Culture: <10000 — AB

## 2020-02-23 LAB — HIV ANTIBODY (ROUTINE TESTING W REFLEX): HIV Screen 4th Generation wRfx: NONREACTIVE

## 2020-02-23 LAB — STREP PNEUMONIAE URINARY ANTIGEN: Strep Pneumo Urinary Antigen: NEGATIVE

## 2020-02-23 LAB — TROPONIN I (HIGH SENSITIVITY)
Troponin I (High Sensitivity): 100 ng/L (ref <18)
Troponin I (High Sensitivity): 96 ng/L — ABNORMAL HIGH (ref <18)

## 2020-02-23 LAB — LACTIC ACID, PLASMA: Lactic Acid, Venous: 3.5 mmol/L (ref 0.5–1.9)

## 2020-02-23 LAB — TSH: TSH: 1.935 u[IU]/mL (ref 0.350–4.500)

## 2020-02-23 LAB — BRAIN NATRIURETIC PEPTIDE: B Natriuretic Peptide: 1098.3 pg/mL — ABNORMAL HIGH (ref 0.0–100.0)

## 2020-02-23 LAB — SARS CORONAVIRUS 2 (TAT 6-24 HRS): SARS Coronavirus 2: NEGATIVE

## 2020-02-23 LAB — MAGNESIUM: Magnesium: 1.7 mg/dL (ref 1.7–2.4)

## 2020-02-23 MED ORDER — ACETAMINOPHEN 325 MG PO TABS
650.0000 mg | ORAL_TABLET | Freq: Three times a day (TID) | ORAL | Status: DC
  Administered 2020-02-23 – 2020-02-27 (×13): 650 mg via ORAL
  Filled 2020-02-23 (×12): qty 2

## 2020-02-23 MED ORDER — SODIUM CHLORIDE 0.9 % IV SOLN: 1000.0000 mL | INTRAVENOUS | Status: DC

## 2020-02-23 MED ORDER — DICLOFENAC SODIUM 1 % EX GEL
2.0000 g | Freq: Four times a day (QID) | CUTANEOUS | Status: DC | PRN
  Filled 2020-02-23: qty 100

## 2020-02-23 MED ORDER — IPRATROPIUM BROMIDE 0.02 % IN SOLN: 0.5000 mg | Freq: Four times a day (QID) | RESPIRATORY_TRACT | Status: DC | PRN

## 2020-02-23 MED ORDER — LIDOCAINE 5 % EX PTCH
1.0000 | MEDICATED_PATCH | CUTANEOUS | Status: DC
  Administered 2020-02-23 – 2020-02-27 (×5): 1 via TRANSDERMAL
  Filled 2020-02-23 (×5): qty 1

## 2020-02-23 NOTE — Consult Note (Signed)
Consultation Note Date: 02/23/2020   Patient Name: Shannon Lucas  DOB:  08-Feb-1931  MRN:  [id number]  Age / Sex: 86 y.o., female  PCP: Manfred Shirts, PA Referring Physician: Aline August, MD  Reason for Consultation: Establishing goals of care  HPI/Patient Profile:  Per H&P --> 86 year old female with history of diabetes mellitus type 2, paroxysmal A. fib with tachy/bradycardia syndrome, chronic kidney disease stage IV, chronic systolic CHF, recent UTI was found down at home on 02/22/2020.  Patient apparently felt weak and tripped and fell.  In the ED she was found to have altered mental status with question of pneumonia/UTI and was found to be febrile at 103F.  She was started on IV antibiotics and fluids.  Palliative care was asked to get involved to aid in goals of care conversations.   Clinical Assessment and Goals of Care: I have reviewed medical records including EPIC notes, labs and imaging, received report from bedside RN, assessed the patient. Kileigh was very sleepy at the time of our assessment. She would intermittently arouse then fall back asleep.    I met with [name] and her daughter, Zerita Boers to further discuss diagnosis prognosis, GOC, EOL wishes, disposition and options.   I introduced Palliative Medicine as specialized medical care for people living with serious illness. It focuses on providing relief from the symptoms and stress of a serious illness. The goal is to improve quality of life for both the patient and the family.  I asked patients daughter, Ginger to tell me about her mother. Ginger shares that Evarose is from [facility]. She was married to her husband for fifty eight years prior to his passing. She had three children a son who passed away in a car accident, a daughter who passed away when someone was cleaning their gun it ended up shooting through her artery,  and her daughter Ginger who is present in the patients life. She has six grandchildren and seven great grandchildren. Gwendalynn gets tremendous joy out of spending time with her family. She is a woman of faith though considers herself nondenominational.  Yailene lives in an in Sports coach suite at her daughters home. She was fully independent of bADLS and iADLs prior to hospitalization. She was still driving herself weekly to get her hair done as well as her nails.   I asked Ginger how Hether's health was prior to hospitalization. Ginger shares that overall it has been "good" she said that she has diabetes but that it has been under good control. Per more formal chart review it appears that she also has CKD IV, diastolic CHF, Afib, and has a pacemaker for tachy-brady syndrome. It does appear that these things have been well controlled. Patient herself has good compliance with medical management.   I asked Ginger if she and her mother had every discussed code status. She shares that her mother has a living will. She plans to bring this in tomorrow for review. I was able to arouse Catia for time enough for  her to share that she would never want to be in a vegetative state. She was not able to participate beyond this. We will broach this topic again tomorrow when the patient is more aware.   Discussed the importance of continued conversation with family and their  medical providers regarding overall plan of care and treatment options, ensuring decisions are within the context of the patients values and GOCs.  Decision Maker: Skilar can make decisions for herself though if she were in a position where she could not make decisions she would like her daughter, Ginger Deon Pilling to make decisions on her behalf.  SUMMARY OF RECOMMENDATIONS   Full code right now, awaiting patients living will for review. Daughter did not want to make any decisions, patient was too sleepy to do so.  Chaplain Consult  PT/OT  Pain  management  Code Status/Advance Care Planning:  Full code  Symptom Management:  Generalized Pain: Acute Rib Pain, in the setting of fall:  - Tylenol 65m PO TID  - Lidoderm patch  - Voltaren gel  Muscular Weakness:  - PT  - OT Palliative Prophylaxis:   Aspiration, Bowel Regimen, Delirium Protocol, Eye Care, Frequent Pain Assessment, Oral Care, Palliative Wound Care and Turn Reposition  Additional Recommendations (Limitations, Scope, Preferences):  Full Scope Treatment  Psycho-social/Spiritual:   Desire for further Chaplaincy support: Yes  Additional Recommendations: Caregiving  Support/Resources  Prognosis:   Unable to determine  Discharge Planning: Unable to determine will depend on PT/OT evaluations    Primary Diagnoses: Present on Admission: . Fall . CKD (chronic kidney disease) stage 3, GFR 30-59 ml/min . Paroxysmal atrial fibrillation (HCC) . Chronic systolic heart failure ([facility] . Pure hypercholesterolemia . Sepsis ([facility] . Tachy-brady syndrome ([facility] . Hyperparathyroidism due to renal insufficiency ([facility] . Essential hypertension . Pneumonia  I have reviewed the medical record, interviewed the patient and family, and examined the patient. The following aspects are pertinent. Past Medical History:  Diagnosis Date  . A-fib ([facility]   . Arthritis    "a little bit qwhere" (02/28/2017)  . CHF (congestive heart failure) ([facility]   . CKD (chronic kidney disease), stage III    /[name]4/17/2018  . Depression   . GERD (gastroesophageal reflux disease)   . High cholesterol   . Hypertension   . Melanoma of back ([facility]   . Myocardial infarction ([facility]    "one dr said I'd had 1" (02/28/2017)  . Stroke ([facility] ~ 2010; ~2012   "affected the right side but I fully recovered; just a light one" (02/28/2017)  . Type II diabetes mellitus ([facility]    Social History   Socioeconomic History  . Marital status: Widowed    Spouse name: Not on file  . Number of children: Not on  file  . Years of education: Not on file  . Highest education level: Not on file  Occupational History  . Not on file  Tobacco Use  . Smoking status: Former SResearch scientist (life sciences) . Smokeless tobacco: Never Used  . Tobacco comment: 02/28/2017 "only smoked in the 1960s when we went out"  Substance and Sexual Activity  . Alcohol use: No  . Drug use: No  . Sexual activity: Never  Other Topics Concern  . Not on file  Social History Narrative  . Not on file   Social Determinants of Health   Financial Resource Strain:   . Difficulty of Paying Living Expenses:   Food Insecurity:   . Worried About RCharity fundraiserin the  Last Year:   . [place] in the Last Year:   Transportation Needs:   . Film/video editor (Medical):   Marland Kitchen Lack of Transportation (Non-Medical):   Physical Activity:   . Days of Exercise per Week:   . Minutes of Exercise per Session:   Stress:   . Feeling of Stress :   Social Connections:   . Frequency of Communication with Friends and Family:   . Frequency of Social Gatherings with Friends and Family:   . Attends Religious Services:   . Active Member of Clubs or Organizations:   . Attends Archivist Meetings:   Marland Kitchen Marital Status:    Family History  Problem Relation Age of Onset  . Diabetes Mother   . Stroke Father   . Parkinson's disease Brother    Scheduled Meds: . acetaminophen  650 mg Oral TID  . amiodarone  200 mg Oral Daily  . furosemide  20 mg Oral Daily  . insulin glargine  55 Units Subcutaneous Daily  . isosorbide mononitrate  30 mg Oral Daily  . levothyroxine  50 mcg Oral Daily  . lidocaine  1 patch Transdermal Q24H  . metoprolol succinate  100 mg Oral Daily  . pravastatin  80 mg Oral QHS  . Rivaroxaban  15 mg Oral Q supper  . venlafaxine  75 mg Oral Q breakfast   Continuous Infusions: . azithromycin Stopped (02/22/20 1929)  . cefTRIAXone (ROCEPHIN)  IV Stopped (02/22/20 1902)   PRN Meds:.benzonatate, diclofenac Sodium,  ipratropium, oxyCODONE Medications Prior to Admission:  Prior to Admission medications   Medication Sig Start Date End Date Taking? Authorizing Provider  acetaminophen (TYLENOL) 500 MG tablet Take 1,000 mg by mouth every 6 (six) hours as needed for moderate pain or headache.   Yes [provider]  amiodarone (PACERONE) 200 MG tablet TAKE 1 TABLET BY MOUTH [place] Patient taking differently: Take 200 mg by mouth daily.  11/05/19  Yes Bensimhon, Shaune Pascal, MD  benzonatate (TESSALON) 100 MG capsule Take 100 mg by mouth 3 (three) times daily as needed for cough.   Yes [provider]  ergocalciferol (VITAMIN D2) 50000 units capsule Take 50,000 Units by mouth every Friday. At night   Yes [provider]  ferrous sulfate 325 (65 FE) MG tablet Take 1 tablet (325 mg total) by mouth 3 (three) times daily with meals. 03/19/19  Yes Larey Dresser, MD  furosemide (LASIX) 40 MG tablet Take 0.5 tablets (20 mg total) by mouth daily. Please call for office visit [phone] 01/07/20  Yes Larey Dresser, MD  ipratropium (ATROVENT HFA) 17 MCG/ACT inhaler Inhale 2 puffs into the lungs every 6 (six) hours as needed for wheezing. 09/06/17  Yes Larey Dresser, MD  isosorbide mononitrate (IMDUR) 30 MG 24 hr tablet Take 1 tablet (30 mg total) by mouth daily. 03/19/19  Yes Larey Dresser, MD  LANTUS SOLOSTAR 100 UNIT/ML Solostar Pen Inject 55 Units into the skin daily.  02/24/17  Yes [provider]  levothyroxine (SYNTHROID) 50 MCG tablet Take 50 mcg by mouth daily. 01/29/20  Yes [provider]  metoprolol succinate (TOPROL-XL) 100 MG 24 hr tablet TAKE 1 TABLET BY MOUTH DAILY TAKE WITH OR IMMDIATELY FOLLOWING A MEAL Patient taking differently: Take 100 mg by mouth daily.  02/04/19  Yes Larey Dresser, MD  nitrofurantoin, macrocrystal-monohydrate, (MACROBID) 100 MG capsule Take 100 mg by mouth 2 (two) times daily. 02/21/20  Yes [provider]  oxyCODONE (  OXY  IR/ROXICODONE) 5 MG immediate release tablet Take 5 mg by mouth every 6 (six) hours as needed for pain. 02/21/20  Yes [provider]  pravastatin (PRAVACHOL) 80 MG tablet Take 1 tablet (80 mg total) by mouth at bedtime. 03/19/19  Yes Larey Dresser, MD  sacubitril-valsartan (ENTRESTO) 49-51 MG Take 1 tablet by mouth 2 (two) times daily. 02/13/20  Yes Sande Rives E, PA-C  spironolactone (ALDACTONE) 25 MG tablet TAKE 1/2 TABLET BY MOUTH DAILY Patient taking differently: Take 12.5 mg by mouth daily.  09/18/19  Yes Larey Dresser, MD  venlafaxine (EFFEXOR) 75 MG tablet Take 75 mg by mouth daily.  02/03/17  Yes [provider]  XARELTO 15 MG TABS tablet TAKE 1 TABLET BY MOUTH DAILY WITH SUPPER Patient taking differently: Take 15 mg by mouth daily with supper.  03/19/19  Yes Larey Dresser, MD   No Known Allergies Review of Systems  Neurological: Positive for weakness.   Physical Exam Vitals and nursing note reviewed.  Constitutional:      Comments: Dozing off intermittently  HENT:     Head: Normocephalic.     Nose: Nose normal.     Mouth/Throat:     Mouth: Mucous membranes are dry.  Eyes:     Pupils: Pupils are equal, round, and reactive to light.  Cardiovascular:     Rate and Rhythm: Normal rate and regular rhythm.     Pulses: Normal pulses.  Pulmonary:     Effort: Pulmonary effort is normal.  Abdominal:     Palpations: Abdomen is soft.  Musculoskeletal:        General: Normal range of motion.     Cervical back: Normal range of motion.  Skin:    General: Skin is warm and dry.     Capillary Refill: Capillary refill takes less than 2 seconds.  Neurological:     Comments: Oriented to person and place    Vital Signs: BP 127/79 (BP Location: Left Arm)   Pulse 82   Temp 98.3 F (36.8 C) (Oral)   Resp (!) 21   Ht 5' 5"  (1.651 m)   Wt 97.5 kg   SpO2 95%   BMI 35.78 kg/m  Pain Scale: 0-10   Pain Score: 0-No pain SpO2: SpO2: 95 % O2 Device:SpO2: 95 % O2  Flow Rate: .O2 Flow Rate (L/min): 4 L/min IO: Intake/output summary:   Intake/Output Summary (Last 24 hours) at 02/23/2020 1407 Last data filed at 02/23/2020 0900 Gross per 24 hour  Intake 3581.26 ml  Output --  Net 3581.26 ml   LBM:   Baseline Weight: Weight: 97.5 kg Most recent weight: Weight: 97.5 kg     Palliative Assessment/Data 40%: Time In: 1300 Time Out: 1415 Time Total: 75 Greater than 50%  of this time was spent counseling and coordinating care related to the above assessment and plan.  Signed by: Rosezella Rumpf, NP   Please contact Palliative Medicine Team phone at [phone] for questions and concerns.  For individual provider: See Shea Evans

## 2020-02-23 NOTE — Progress Notes (Signed)
Patient ID: Shannon Lucas, female   DOB: Nov 08, 1931, 86 y.o.   MRN: [id number]  PROGRESS NOTE    Karuna Balducci  WER:[id number] DOB: [birth year] DOA: 02/22/2020 PCP: Manfred Shirts, PA   Brief Narrative:  86 year old female with history of diabetes mellitus type 2, paroxysmal A. fib with tacky/bradycardia syndrome, chronic kidney disease stage IV, chronic systolic CHF, recent UTI was found down at home on 02/22/2020.  Patient apparently felt weak and tripped and fell.  In the ED she was found to have altered mental status with question of pneumonia/UTI and was found to be febrile at 103F.  She was started on IV antibiotics and fluids.  Assessment & Plan:   Sepsis: Present on admission Acute hypoxic respiratory failure Probable bilateral pneumonia UTI -Currently on Rocephin and Zithromax.  Follow cultures.  Oxygen supplementation as needed.  Currently on 4 L oxygen.  Wean off as able. -Hemodynamically stable.  Currently on IV fluids.  DC IV fluids as patient has history of systolic heart failure. -SLP evaluation  Acute metabolic encephalopathy -Probably from above.  CT head was negative for acute abnormality.  Mental status is slightly improving, although slow to respond.  Will hold off on MRI at this time  CKD stage IV -Creatinine at baseline.  Monitor.  DC IV fluids.  Outpatient follow-up with nephrology.  Entresto and spironolactone on hold for now.  Chronic systolic CHF with history of dual-chamber pacemaker -Currently compensated.  Strict input and output.  Daily weights.  Fluid restriction.  Will notify heart failure team tomorrow about patient being in the hospital. -Continue metoprolol, Imdur, Lasix.  Entresto and spironolactone on hold for now as above.  Increased LFTs -Probably from sepsis.  Monitor.  Right upper quadrant ultrasound.  Hold Pravachol  Paroxysmal A. fib with tachybradycardia syndrome status post dual-chamber pacemaker placement -Continue metoprolol and amiodarone and  Xarelto  Thrombocytopenia -Probably from sepsis.  Monitor.  Hypertension -Antihypertensive plan as above.  Monitor blood pressure  Dyslipidemia -Hold Pravachol due to LFTs  Generalized deconditioning Recent falls -PT eval -Overall prognosis is guarded to poor.  Palliative care evaluation for goals of care discussion.  Currently full code.   DVT prophylaxis: Xarelto Code Status: Full Family Communication: Spoke to patient at bedside Disposition Plan: Probably home in 2 to 3 days pending clinical improvement and PT eval  Consultants: Palliative care  Procedures: None  Antimicrobials: Rocephin and Zithromax from 02/22/2020 onwards   Subjective: Patient seen and examined at bedside.  She is sleepy, wakes up slightly, slow to respond to questions.  Poor historian.  No overnight fever or vomiting reported.  Objective: Vitals:   02/23/20 0211 02/23/20 0406 02/23/20 0539 02/23/20 0737  BP: (!) 142/68 129/74 100/84 127/79  Pulse:   (!) 102 82  Resp:    (!) 21  Temp: 99.5 F (37.5 C) 98.4 F (36.9 C) 97.8 F (36.6 C) 98.3 F (36.8 C)  TempSrc: Oral Oral Oral Oral  SpO2:    95%  Weight:      Height:        Intake/Output Summary (Last 24 hours) at 02/23/2020 1015 Last data filed at 02/23/2020 0900 Gross per 24 hour  Intake 3581.26 ml  Output --  Net 3581.26 ml   Filed Weights   02/22/20 1717  Weight: 97.5 kg    Examination:  General exam: Appears calm and comfortable.  Looks chronically ill.  On 4 L oxygen via nasal cannula currently.  Poor historian Respiratory system: Bilateral decreased breath sounds at  bases with some scattered crackles Cardiovascular system: S1 & S2 heard, tachycardic  gastrointestinal system: Abdomen is nondistended, soft and nontender. Normal bowel sounds heard. Extremities: No cyanosis, clubbing; trace lower extremity edema  Central nervous system: Wakes up slightly, answers some questions, slow to respond.  No focal neurological deficits.  Moving extremities Skin: No rashes, lesions or ulcers Psychiatry: Could not be assessed properly because of mental status.    Data Reviewed: I have personally reviewed following labs and imaging studies  CBC: Recent Labs  Lab 02/22/20 1714 02/23/20 0047 02/23/20 0754  WBC 10.5 9.1 8.6  NEUTROABS 10.0* 8.7* 7.9*  HGB 12.8 12.1 11.8*  HCT 40.6 38.2 37.3  MCV 101.0* 102.1* 100.8*  PLT 137* 125* 470*   Basic Metabolic Panel: Recent Labs  Lab 02/22/20 1714 02/23/20 0754  NA 135 137  K 4.7 4.4  CL 105 106  CO2 20* 20*  GLUCOSE 265* 189*  BUN 30* 32*  CREATININE 2.13* 2.01*  CALCIUM 9.0 8.4*  MG  --  1.7   GFR: Estimated Creatinine Clearance: 23.2 mL/min (A) (by C-G formula based on SCr of 2.01 mg/dL (H)). Liver Function Tests: Recent Labs  Lab 02/22/20 1714 02/23/20 0754  AST 57* 233*  ALT 37 161*  ALKPHOS 109 132*  BILITOT 0.8 1.1  PROT 6.4* 5.8*  ALBUMIN 3.5 3.0*   No results for input(s): LIPASE, AMYLASE in the last 168 hours. No results for input(s): AMMONIA in the last 168 hours. Coagulation Profile: Recent Labs  Lab 02/22/20 1714  INR 1.8*   Cardiac Enzymes: No results for input(s): CKTOTAL, CKMB, CKMBINDEX, TROPONINI in the last 168 hours. BNP (last 3 results) No results for input(s): PROBNP in the last 8760 hours. HbA1C: No results for input(s): HGBA1C in the last 72 hours. CBG: Recent Labs  Lab 02/22/20 1620 02/23/20 0027 02/23/20 0638  GLUCAP 267* 190* 141*   Lipid Profile: No results for input(s): CHOL, HDL, LDLCALC, TRIG, CHOLHDL, LDLDIRECT in the last 72 hours. Thyroid Function Tests: Recent Labs    02/22/20 2330  TSH 1.935   Anemia Panel: No results for input(s): VITAMINB12, FOLATE, FERRITIN, TIBC, IRON, RETICCTPCT in the last 72 hours. Sepsis Labs: Recent Labs  Lab 02/22/20 1714 02/22/20 2045 02/22/20 2330 02/23/20 0754  PROCALCITON  --   --   --  32.23  LATICACIDVEN 2.0* 2.6* 3.5*  --     Recent Results (from the  past 240 hour(s))  SARS CORONAVIRUS 2 (TAT 6-24 HRS) Nasopharyngeal Nasopharyngeal Swab     Status: None   Collection Time: 02/22/20 10:48 PM   Specimen: Nasopharyngeal Swab  Result Value Ref Range Status   SARS Coronavirus 2 NEGATIVE NEGATIVE Final    Comment: (NOTE) SARS-CoV-2 target nucleic acids are NOT DETECTED. The SARS-CoV-2 RNA is generally detectable in upper and lower respiratory specimens during the acute phase of infection. Negative results do not preclude SARS-CoV-2 infection, do not rule out co-infections with other pathogens, and should not be used as the sole basis for treatment or other patient management decisions. Negative results must be combined with clinical observations, patient history, and epidemiological information. The expected result is Negative. Fact Sheet for Patients: SugarRoll.be Fact Sheet for Healthcare Providers: https://www.woods-mathews.com/ This test is not yet approved or cleared by the [place] FDA and  has been authorized for detection and/or diagnosis of SARS-CoV-2 by FDA under an Emergency Use Authorization (EUA). This EUA will remain  in effect (meaning this test can be used) for the duration of  the COVID-19 declaration under Section 56 4(b)(1) of the Act, 21 U.S.C. section 360bbb-3(b)(1), unless the authorization is terminated or revoked sooner. Performed at [facility], [place] [address] [place], [place] [address]   Respiratory Panel by PCR     Status: None   Collection Time: 02/22/20 11:30 PM   Specimen: Nasopharyngeal Swab; Respiratory  Result Value Ref Range Status   Adenovirus NOT DETECTED NOT DETECTED Final   Coronavirus 229E NOT DETECTED NOT DETECTED Final    Comment: (NOTE) The Coronavirus on the Respiratory Panel, DOES NOT test for the novel  Coronavirus (2019 nCoV)    Coronavirus HKU1 NOT DETECTED NOT DETECTED Final   Coronavirus NL63 NOT DETECTED NOT DETECTED Final    Coronavirus OC43 NOT DETECTED NOT DETECTED Final   Metapneumovirus NOT DETECTED NOT DETECTED Final   Rhinovirus / Enterovirus NOT DETECTED NOT DETECTED Final   Influenza A NOT DETECTED NOT DETECTED Final   Influenza B NOT DETECTED NOT DETECTED Final   Parainfluenza Virus 1 NOT DETECTED NOT DETECTED Final   Parainfluenza Virus 2 NOT DETECTED NOT DETECTED Final   Parainfluenza Virus 3 NOT DETECTED NOT DETECTED Final   Parainfluenza Virus 4 NOT DETECTED NOT DETECTED Final   Respiratory Syncytial Virus NOT DETECTED NOT DETECTED Final   Bordetella pertussis NOT DETECTED NOT DETECTED Final   Chlamydophila pneumoniae NOT DETECTED NOT DETECTED Final   Mycoplasma pneumoniae NOT DETECTED NOT DETECTED Final    Comment: Performed at [facility], [place]. [address] [place], [place] [address]         Radiology Studies: CT Head Wo Contrast  Result Date: 02/22/2020 CLINICAL DATA:  Fall, found down EXAM: CT HEAD WITHOUT CONTRAST TECHNIQUE: Contiguous axial images were obtained from the base of the skull through the vertex without intravenous contrast. COMPARISON:  03/21/2019 FINDINGS: Brain: No evidence of acute infarction, hemorrhage, hydrocephalus, extra-axial collection or mass lesion/mass effect. Mild cortical atrophy. Subcortical white matter and periventricular small vessel ischemic changes. 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 Vascular: Mild intracranial atherosclerosis. Skull: Normal. Negative for fracture or focal lesion. Sinuses/Orbits: The visualized paranasal sinuses are essentially clear. The mastoid air cells are unopacified. Other: None. IMPRESSION: No evidence of acute intracranial abnormality. Atrophy with small vessel ischemic changes. 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 Electronically Signed   By: Julian Hy M.D.   On: 02/22/2020 18:24   DG Chest Port 1 View  Result Date: 02/22/2020 CLINICAL DATA:  Confusion. EXAM: PORTABLE CHEST 1 VIEW COMPARISON:  None. FINDINGS: The  right hilum is mildly prominent, possibly due to positioning. The heart, left hilum, and mediastinum are normal. Bilateral interstitial opacities. More patchy opacity on the right. No other acute abnormalities. IMPRESSION: 1. Bilateral interstitial opacities may represent edema or atypical infection. 2. Opacity is more patchy with a few more focal regions of opacity on the right. This could represent asymmetric edema versus developing pneumonia. Electronically Signed   By: Dorise Bullion III M.D   On: 02/22/2020 16:50        Scheduled Meds: . amiodarone  200 mg Oral Daily  . furosemide  20 mg Oral Daily  . insulin glargine  55 Units Subcutaneous Daily  . isosorbide mononitrate  30 mg Oral Daily  . levothyroxine  50 mcg Oral Daily  . metoprolol succinate  100 mg Oral Daily  . pravastatin  80 mg Oral QHS  . Rivaroxaban  15 mg Oral Q supper  . venlafaxine  75 mg Oral Q breakfast   Continuous Infusions: .  sodium chloride 75 mL/hr at 02/23/20 0748  . azithromycin Stopped (02/22/20 1929)  . cefTRIAXone (ROCEPHIN)  IV Stopped (02/22/20 1902)          Aline August, MD [facility] 02/23/2020, 10:15 AM

## 2020-02-23 NOTE — Evaluation (Signed)
Physical Therapy Evaluation Patient Details Name: Shannon Lucas MRN: [id number] DOB: [birth year] Today's Date: 02/23/2020   History of Present Illness  86 year old female with history of diabetes mellitus type 2, paroxysmal A. fib with tacky/bradycardia syndrome, chronic kidney disease stage IV, chronic systolic CHF, recent UTI was found down at home on 02/22/2020.  Patient apparently felt weak and tripped and fell.  In the ED she was found to have altered mental status with question of pneumonia/UTI and was found to be febrile at 103F.    Clinical Impression  Daughter present and attentive.  Pt lives with her.  Pt was independent prior to this - did her own meds, shower and able to drive and do her own errands.  Pt is watched closely.  Daughter saw her and she was normal- and within an hour pt confused and had fallen.  Pt confused today and not able to follow commands without max repetition.  O2 sats stayed in upper 90s on RA.  Pt incontinent of urine as soon as she stood up.  Pt limited in her walking due to her rib pain. Pt HOH and has lost her hearing aides.  Pt educated in use of inspirometer - pt tending to take short shallow breaths.  Daughter unable to care for her 24/7 so will need pt to go short term SNF prior to home with intermittent family assist.    Follow Up Recommendations SNF;Supervision/Assistance - 24 hour    Equipment Recommendations  None recommended by PT    Recommendations for Other Services       Precautions / Restrictions Precautions Precautions: Fall Precaution Comments: Pt has had approx 4 falls in last 3 months per daughter Restrictions Weight Bearing Restrictions: No      Mobility  Bed Mobility Overal bed mobility: Needs Assistance Bed Mobility: Rolling;Supine to Sit Rolling: Mod assist   Supine to sit: Mod assist;HOB elevated     General bed mobility comments: pt hurting a lot and needed assist to elevat her body and scoot out to  EOB  Transfers Overall transfer level: Needs assistance Equipment used: Rolling walker (2 wheeled) Transfers: Sit to/from [facility] Sit to Stand: Min assist Stand pivot transfers: Min assist       General transfer comment: pt needed max cues for technque and hand placment.  pt needed several attempts to stand from  high surface.  Pt needed max cues to sit safely in chair - wanted to sit too soon  Ambulation/Gait Ambulation/Gait assistance: Min assist Gait Distance (Feet): 15 Feet Assistive device: Rolling walker (2 wheeled) Gait Pattern/deviations: Decreased stride length;Trunk flexed     General Gait Details: pt with short shuffling steps and occasional moaning due to sore ribs.  she had hard time advancing RW.  she needed lots of cues for turning etc.  Stairs            Wheelchair Mobility    Modified Rankin (Stroke Patients Only)       Balance Overall balance assessment: Needs assistance;History of Falls   Sitting balance-Leahy Scale: Fair Sitting balance - Comments: pt able to sit EOB safely - sore in ribs       Standing balance comment: Pt standing with RW only - needs UE support                             Pertinent Vitals/Pain Pain Assessment: Faces Faces Pain Scale: Hurts even more Pain Location: right lower  ribcage Pain Descriptors / Indicators: Burning;Discomfort;Nagging;Sharp Pain Intervention(s): Limited activity within patient's tolerance;Repositioned;Monitored during session    Home Living Family/patient expects to be discharged to:: Private residence Living Arrangements: Children Available Help at Discharge: Family;Available PRN/intermittently Type of Home: House       Home Layout: One level Home Equipment: [place] - 4 wheels;Cane - single point      Prior Function Level of Independence: Independent with assistive device(s)         Comments: pt independent but lives in [place] with daughter that  works full time.  pt independent, showers independently, drives.  Just went to buy flowers to plant the day before she fell.  pt uses cane most of the time but has rollator.  Pt does her own meds per daughter     Hand Dominance        Extremity/Trunk Assessment        Lower Extremity Assessment Lower Extremity Assessment: Generalized weakness    Cervical / Trunk Assessment Cervical / Trunk Assessment: Normal  Communication   Communication: HOH(pt lost her hearing aides per daughter)  Cognition Arousal/Alertness: Awake/alert Behavior During Therapy: Impulsive;Restless Overall Cognitive Status: Impaired/Different from baseline                                 General Comments: pt confused - talking about her duaghter that passed away last year.  Pt not following verbal commands without lots of repetition and cues.  poor safety awareness - not at all baseline per duaghter      General Comments General comments (skin integrity, edema, etc.): Pt incontinent of urine when she first stood up - brought [facility] to her and she was abel to keep going in [facility]    Exercises Other Exercises Other Exercises: Pt did inspirometer - needed max cues - able to get to 736ml inconsistently   Assessment/Plan    PT Assessment Patient needs continued PT services  PT Problem List Decreased strength;Decreased mobility;Decreased safety awareness;Decreased knowledge of precautions;Decreased coordination;Decreased activity tolerance;Decreased cognition;Cardiopulmonary status limiting activity;Decreased balance;Decreased knowledge of use of DME;Pain       PT Treatment Interventions DME instruction;Therapeutic activities;Gait training;Therapeutic exercise;Patient/family education;Balance training;Functional mobility training    PT Goals (Current goals can be found in the Care Plan section)  Acute Rehab PT Goals Patient Stated Goal: daughter wants pt to be able to be independent prior to  home PT Goal Formulation: With patient/family Time For Goal Achievement: 03/08/20 Potential to Achieve Goals: Good    Frequency Min 3X/week   Barriers to discharge Decreased caregiver support daughter not available 24/7 -she works full time    Co-evaluation               AM-PAC PT "6 Clicks" Mobility  Outcome Measure Help needed turning from your back to your side while in a flat bed without using bedrails?: A Lot Help needed moving from lying on your back to sitting on the side of a flat bed without using bedrails?: A Lot Help needed moving to and from a bed to a chair (including a wheelchair)?: A Lot Help needed standing up from a chair using your arms (e.g., wheelchair or bedside chair)?: A Lot Help needed to walk in hospital room?: A Lot Help needed climbing 3-5 steps with a railing? : Total 6 Click Score: 11    End of Session Equipment Utilized During Treatment: Gait belt Activity Tolerance: Patient tolerated treatment well;Patient  limited by pain Patient left: in chair;with chair alarm set;with family/visitor present;with call bell/phone within reach Nurse Communication: Mobility status PT Visit Diagnosis: Repeated falls (R29.6);Muscle weakness (generalized) (M62.81);Difficulty in walking, not elsewhere classified (R26.2);Pain    Time: 1430-1520 PT Time Calculation (min) (ACUTE ONLY): 50 min   Charges:   PT Evaluation $PT Eval Low Complexity: 1 Low PT Treatments $Gait Training: 8-22 mins $Therapeutic Activity: 8-22 mins        02/23/2020   Rande Lawman, PT   Loyal Buba 02/23/2020, 3:38 PM

## 2020-02-24 DIAGNOSIS — R6520 Severe sepsis without septic shock: Secondary | ICD-10-CM

## 2020-02-24 DIAGNOSIS — A419 Sepsis, unspecified organism: Principal | ICD-10-CM

## 2020-02-24 DIAGNOSIS — I480 Paroxysmal atrial fibrillation: Secondary | ICD-10-CM

## 2020-02-24 DIAGNOSIS — J189 Pneumonia, unspecified organism: Secondary | ICD-10-CM | POA: Diagnosis not present

## 2020-02-24 DIAGNOSIS — Z7901 Long term (current) use of anticoagulants: Secondary | ICD-10-CM | POA: Diagnosis not present

## 2020-02-24 DIAGNOSIS — I5022 Chronic systolic (congestive) heart failure: Secondary | ICD-10-CM | POA: Diagnosis not present

## 2020-02-24 DIAGNOSIS — N184 Chronic kidney disease, stage 4 (severe): Secondary | ICD-10-CM | POA: Diagnosis not present

## 2020-02-24 DIAGNOSIS — N1832 Chronic kidney disease, stage 3b: Secondary | ICD-10-CM | POA: Diagnosis not present

## 2020-02-24 LAB — CBC WITH DIFFERENTIAL/PLATELET
Abs Immature Granulocytes: 0.02 10*3/uL (ref 0.00–0.07)
Basophils Absolute: 0 10*3/uL (ref 0.0–0.1)
Basophils Relative: 0 %
Eosinophils Absolute: 0 10*3/uL (ref 0.0–0.5)
Eosinophils Relative: 0 %
HCT: 33.9 % — ABNORMAL LOW (ref 36.0–46.0)
Hemoglobin: 10.9 g/dL — ABNORMAL LOW (ref 12.0–15.0)
Immature Granulocytes: 0 %
Lymphocytes Relative: 6 %
Lymphs Abs: 0.3 10*3/uL — ABNORMAL LOW (ref 0.7–4.0)
MCH: 32.2 pg (ref 26.0–34.0)
MCHC: 32.2 g/dL (ref 30.0–36.0)
MCV: 100.3 fL — ABNORMAL HIGH (ref 80.0–100.0)
Monocytes Absolute: 0.2 10*3/uL (ref 0.1–1.0)
Monocytes Relative: 5 %
Neutro Abs: 4.4 10*3/uL (ref 1.7–7.7)
Neutrophils Relative %: 89 %
Platelets: 92 10*3/uL — ABNORMAL LOW (ref 150–400)
RBC: 3.38 MIL/uL — ABNORMAL LOW (ref 3.87–5.11)
RDW: 14.2 % (ref 11.5–15.5)
WBC: 5 10*3/uL (ref 4.0–10.5)
nRBC: 0 % (ref 0.0–0.2)

## 2020-02-24 LAB — LEGIONELLA PNEUMOPHILA SEROGP 1 UR AG: L. pneumophila Serogp 1 Ur Ag: NEGATIVE

## 2020-02-24 LAB — TSH: TSH: 4.816 u[IU]/mL — ABNORMAL HIGH (ref 0.350–4.500)

## 2020-02-24 LAB — COMPREHENSIVE METABOLIC PANEL
ALT: 147 U/L — ABNORMAL HIGH (ref 0–44)
AST: 153 U/L — ABNORMAL HIGH (ref 15–41)
Albumin: 2.7 g/dL — ABNORMAL LOW (ref 3.5–5.0)
Alkaline Phosphatase: 121 U/L (ref 38–126)
Anion gap: 12 (ref 5–15)
BUN: 39 mg/dL — ABNORMAL HIGH (ref 8–23)
CO2: 20 mmol/L — ABNORMAL LOW (ref 22–32)
Calcium: 8.1 mg/dL — ABNORMAL LOW (ref 8.9–10.3)
Chloride: 105 mmol/L (ref 98–111)
Creatinine, Ser: 2.13 mg/dL — ABNORMAL HIGH (ref 0.44–1.00)
GFR calc Af Amer: 24 mL/min — ABNORMAL LOW (ref >60)
GFR calc non Af Amer: 20 mL/min — ABNORMAL LOW (ref >60)
Glucose, Bld: 88 mg/dL (ref 70–99)
Potassium: 4.2 mmol/L (ref 3.5–5.1)
Sodium: 137 mmol/L (ref 135–145)
Total Bilirubin: 0.8 mg/dL (ref 0.3–1.2)
Total Protein: 5.3 g/dL — ABNORMAL LOW (ref 6.5–8.1)

## 2020-02-24 LAB — MAGNESIUM: Magnesium: 1.8 mg/dL (ref 1.7–2.4)

## 2020-02-24 LAB — GLUCOSE, CAPILLARY
Glucose-Capillary: 54 mg/dL — ABNORMAL LOW (ref 70–99)
Glucose-Capillary: 88 mg/dL (ref 70–99)
Glucose-Capillary: 123 mg/dL — ABNORMAL HIGH (ref 70–99)

## 2020-02-24 LAB — TROPONIN I (HIGH SENSITIVITY): Troponin I (High Sensitivity): 40 ng/L — ABNORMAL HIGH (ref <18)

## 2020-02-24 LAB — VITAMIN B12: Vitamin B-12: 467 pg/mL (ref 180–914)

## 2020-02-24 LAB — FOLATE: Folate: 8.7 ng/mL (ref >5.9)

## 2020-02-24 LAB — AMMONIA: Ammonia: 35 umol/L (ref 9–35)

## 2020-02-24 MED ORDER — HYDRALAZINE HCL 50 MG PO TABS
100.0000 mg | ORAL_TABLET | Freq: Three times a day (TID) | ORAL | Status: DC
  Administered 2020-02-24 – 2020-02-27 (×10): 100 mg via ORAL
  Filled 2020-02-24 (×11): qty 2

## 2020-02-24 MED ORDER — INSULIN GLARGINE 100 UNIT/ML ~~LOC~~ SOLN
15.0000 [IU] | Freq: Every day | SUBCUTANEOUS | Status: DC
  Administered 2020-02-24 – 2020-02-27 (×4): 15 [IU] via SUBCUTANEOUS
  Filled 2020-02-24 (×4): qty 0.15

## 2020-02-24 MED ORDER — FUROSEMIDE 10 MG/ML IJ SOLN
40.0000 mg | Freq: Once | INTRAMUSCULAR | Status: AC
  Administered 2020-02-24: 40 mg via INTRAVENOUS
  Filled 2020-02-24: qty 4

## 2020-02-24 NOTE — Evaluation (Signed)
Occupational Therapy Evaluation Patient Details Name: Shannon Lucas MRN: [id number] DOB: 21-Mar-1931 Today's Date: 02/24/2020    History of Present Illness 86 year old female with history of diabetes mellitus type 2, paroxysmal A. fib with tacky/bradycardia syndrome, chronic kidney disease stage IV, chronic systolic CHF, recent UTI was found down at home on 02/22/2020.  Patient apparently felt weak and tripped and fell.  In the ED she was found to have altered mental status with question of pneumonia/UTI and was found to be febrile at 103F.   Clinical Impression   Pt PTA: Pt living in daughter's home in the in-law suite. Pt reports independence with ADL and iADLs provided for her. Pt currently with decreased activity tolerance, decreased ability to safely care for self and requiring increased rest breaks. Pt Pt fatigues easily and wanting seated rest break for lower ribcage discomfort. O2 >90% on RA with rest breaks. Pt reports falls recently and moves very slow. Pt minA overall for ADL and minA for mobility with RW. Pt requiring increased time for processing- possibly due to [facility]. Pt would benefit from continued OT skilled services for ADL, mobility and safety. OT following acutely.     Follow Up Recommendations  SNF    Equipment Recommendations  3 in 1 bedside commode    Recommendations for Other Services       Precautions / Restrictions Precautions Precautions: Fall Precaution Comments: Pt has had approx 4 falls in last 3 months per daughter Restrictions Weight Bearing Restrictions: No      Mobility Bed Mobility Overal bed mobility: Needs Assistance Bed Mobility: Rolling;Supine to Sit Rolling: Min guard   Supine to sit: Min assist     General bed mobility comments: Pt minA for trunk elevation and assist for scooting to EOB  Transfers Overall transfer level: Needs assistance Equipment used: Rolling walker (2 wheeled) Transfers: Sit to/from [facility] Sit to  Stand: Min assist Stand pivot transfers: Min assist       General transfer comment: Pt requiring cues for hand placement    Balance Overall balance assessment: Needs assistance;History of Falls Sitting-balance support: Feet supported Sitting balance-Leahy Scale: Fair     Standing balance support: Bilateral upper extremity supported;Single extremity supported;During functional activity Standing balance-Leahy Scale: Fair Standing balance comment: Pt standing at sink, but requires single UE for support                           ADL either performed or assessed with clinical judgement   ADL Overall ADL's : Needs assistance/impaired Eating/Feeding: Modified independent;Sitting   Grooming: Min guard;Standing Grooming Details (indicate cue type and reason): can stand ~2 mins then requires minguardA for stability once standing Upper Body Bathing: Supervision/ safety;Sitting   Lower Body Bathing: Min guard;Minimal assistance;Cueing for safety;Sitting/lateral leans;Sit to/from stand   Upper Body Dressing : Supervision/safety;Sitting   Lower Body Dressing: Minimal assistance   Toilet Transfer: Min guard;Cueing for safety;RW   Toileting- Clothing Manipulation and Hygiene: Minimal assistance;Cueing for safety;Sitting/lateral lean;Sit to/from stand       Functional mobility during ADLs: Min guard;Cueing for safety;Rolling walker General ADL Comments: Pt performing ADL tasks at supervisionA to minguardA level. Pt fatigues easily and wanting seated rest break for lower ribcage discomfort.     Vision Baseline Vision/History: No visual deficits Patient Visual Report: No change from baseline Vision Assessment?: No apparent visual deficits     Perception     Praxis      Pertinent Vitals/Pain Pain  Assessment: Faces Faces Pain Scale: Hurts little more Pain Location: right lower ribcage Pain Descriptors / Indicators: Discomfort;Grimacing Pain Intervention(s): Monitored  during session     Hand Dominance Right   Extremity/Trunk Assessment Upper Extremity Assessment Upper Extremity Assessment: Overall WFL for tasks assessed   Lower Extremity Assessment Lower Extremity Assessment: Generalized weakness;Defer to PT evaluation   Cervical / Trunk Assessment Cervical / Trunk Assessment: Normal   Communication Communication Communication: HOH   Cognition Arousal/Alertness: Awake/alert Behavior During Therapy: WFL for tasks assessed/performed Overall Cognitive Status: Impaired/Different from baseline Area of Impairment: Awareness;Problem solving                           Awareness: Intellectual Problem Solving: Slow processing General Comments: Pt appears to be close to her baseline;no tangential thoughts. Pt requires increased time for processing.   General Comments  O2 >90% on RA with rest breaks. Pt reports falls recently and moves very slow.    Exercises     Shoulder Instructions      Home Living Family/patient expects to be discharged to:: Private residence Living Arrangements: Children Available Help at Discharge: Family;Available PRN/intermittently Type of Home: House Home Access: Stairs to enter [employer] of Steps: 3 Entrance Stairs-Rails: Can reach both Home Layout: One level     Bathroom Shower/Tub: Occupational psychologist: Standard     Home Equipment: Environmental consultant - 4 wheels;Cane - single point;Shower seat - built in          Prior Functioning/Environment Level of Independence: Independent with assistive device(s)        Comments: Pt drives and reports being independent with BADL and pt can fix her own simple meals, but daughter provides sit down meals. grandchildren are in and out of the house and pt watches them too. Pt reports doing her own laundry.        OT Problem List: Decreased strength;Decreased activity tolerance;Impaired balance (sitting and/or standing);Decreased safety  awareness;Pain;Decreased knowledge of use of DME or AE      OT Treatment/Interventions: Self-care/ADL training;Therapeutic exercise;Energy conservation;DME and/or AE instruction;Therapeutic activities;Patient/family education;Balance training;Cognitive remediation/compensation    OT Goals(Current goals can be found in the care plan section) Acute Rehab OT Goals Patient Stated Goal: daughter wants pt to be able to be independent prior to home OT Goal Formulation: With patient Time For Goal Achievement: 03/09/20 Potential to Achieve Goals: Good ADL Goals Pt Will Transfer to Toilet: with modified independence;ambulating;bedside commode Additional ADL Goal #1: Pt will verbalize/utilize 2-3 energy conservation techniques after education performed in order to increase activity tolerance. Additional ADL Goal #2: Pt will participate in 15 mins of ADL tasks with 2 seated rest breaks overall in order to increase independence. Additional ADL Goal #3: Pt will verbalize 2-3 fall prevention strategies in order to decrease fall risk.  OT Frequency: Min 2X/week   Barriers to D/C:            Co-evaluation              AM-PAC OT "6 Clicks" Daily Activity     Outcome Measure Help from another person eating meals?: None Help from another person taking care of personal grooming?: A Little Help from another person toileting, which includes using toliet, bedpan, or urinal?: A Little Help from another person bathing (including washing, rinsing, drying)?: A Little Help from another person to put on and taking off regular upper body clothing?: None Help from another person to put  on and taking off regular lower body clothing?: A Little 6 Click Score: 20   End of Session Equipment Utilized During Treatment: Gait belt;Rolling walker Nurse Communication: Mobility status  Activity Tolerance: Patient tolerated treatment well Patient left: in chair;with call bell/phone within reach;with chair alarm  set  OT Visit Diagnosis: Unsteadiness on feet (R26.81);Pain Pain - part of body: (R sided ribs)                Time: 1820-9906 OT Time Calculation (min): 35 min Charges:  OT General Charges $OT Visit: 1 Visit OT Evaluation $OT Eval Moderate Complexity: 1 Mod OT Treatments $Self Care/Home Management : 8-22 mins  Jefferey Pica, OTR/L Acute Rehabilitation Services Pager: [phone] Office: [phone]   Xylon Croom C 02/24/2020, 4:33 PM

## 2020-02-24 NOTE — Progress Notes (Signed)
Daily Progress Note   Patient Name: Shannon Lucas       Date: 02/24/2020 DOB: 10-Jul-1931  Age: 86 y.o. MRN#: [id number] Attending Physician: Aline August, MD Primary Care Physician: Manfred Shirts, [place] Admit Date: 02/22/2020  Reason for Consultation/Follow-up: Establishing goals of care  Subjective: Patient in bed awake, watching TV an engaging in discussions with daughter. Awake and alert. Denies shortness of breath. Does complain of some pain with movement. Shares that her blood sugar went down earlier this morning and this happens at home also when she doesn't eat enough during the day or eats dinner to early. She reports she generally eats a small snack before bed to prevent the hypoglycemic episode. Daughter is at the bedside sharing patient was not as interactive and did not eat much yesterday despite multiple attempts. Patient's lunch tray at the bedside. Patient was able to recall what all was on her tray and that she was able to eat more than half.   Daughter and I reviewed goals of care discussion she had with my colleague, Shannon Lull, NP on yesterday. Shannon Lucas remains hopeful her mother will continue to show signs of improvement. She reports her mother's quality of life was good despite what it appears to be on paper. We discussed code status given patient is more awake and alert today. Shannon Lucas is planning to bring in patient's will for review and further make decisions regarding patient's code status.   Education provided regarding patient's overall condition. Daughter again reasonable verbalizing her hopes for improvement but also prepared for the worst.   All questions answered and support given.   Length of Stay: 2  Current Medications: Scheduled Meds:  . acetaminophen  650 mg Oral TID    . amiodarone  200 mg Oral Daily  . furosemide  20 mg Oral Daily  . insulin glargine  15 Units Subcutaneous Daily  . isosorbide mononitrate  30 mg Oral Daily  . levothyroxine  50 mcg Oral Daily  . lidocaine  1 patch Transdermal Q24H  . metoprolol succinate  100 mg Oral Daily  . pravastatin  80 mg Oral QHS  . Rivaroxaban  15 mg Oral Q supper  . venlafaxine  75 mg Oral Q breakfast    Continuous Infusions: . azithromycin 500 mg (02/23/20 1852)  . cefTRIAXone (ROCEPHIN)  IV 2  g (02/23/20 1805)    PRN Meds: benzonatate, diclofenac Sodium, ipratropium, oxyCODONE  Physical Exam    -awake, alert, NAD, chronically-ill appearing  -no signs of respiratory distress, unlabored breathing  -follows commands appropriately    Vital Signs: BP (!) 112/56 (BP Location: Left Arm)   Pulse 69   Temp 97.9 F (36.6 C) (Oral)   Resp 17   Ht 5\' 5"  (1.651 m)   Wt 100.9 kg   SpO2 96%   BMI 37.02 kg/m  SpO2: SpO2: 96 % O2 Device: O2 Device: Room Air O2 Flow Rate: O2 Flow Rate (L/min): 4 L/min  Intake/output summary:   Intake/Output Summary (Last 24 hours) at 02/24/2020 1511 Last data filed at 02/23/2020 1722 Gross per 24 hour  Intake --  Output 2 ml  Net -2 ml   LBM: Last BM Date: (PTA) Baseline Weight: Weight: 97.5 kg Most recent weight: Weight: 100.9 kg       Palliative Assessment/Data: PPS 30-40%      Patient Active Problem List   Diagnosis Date Noted  . Palliative care by specialist   . Goals of care, counseling/discussion   . Sepsis ([place]) 02/22/2020  . Thrombocytopenia ([place]) 02/22/2020  . Pneumonia 02/22/2020  . Proliferative diabetic retinopathy of both eyes without macular edema associated with type 2 diabetes mellitus ([place]) 09/27/2018  . Pacemaker 06/25/2018  . Tachy-brady syndrome ([place]) 11/20/2017  . Vitamin D deficiency 10/14/2017  . Chronic systolic heart failure ([place]) 03/15/2017  . Pseudoaneurysm following procedure ([place])   . Acute systolic congestive heart  failure ([place])   . AKI (acute kidney injury) ([place])   . CHF exacerbation ([place]) 03/01/2017  . Fall   . Anemia   . CKD (chronic kidney disease) stage 3, GFR 30-59 ml/min   . Diabetes mellitus type II, controlled ([place])   . Paroxysmal atrial fibrillation ([facility])   . Chronic anticoagulation   . Pure hypercholesterolemia   . Shortness of breath 02/28/2017  . Persistent cough for 3 weeks or longer 10/25/2016  . Murmur 07/28/2016  . Severe nonproliferative diabetic retinopathy of both eyes without macular edema associated with type 2 diabetes mellitus ([place]) 07/13/2016  . Hyperparathyroidism due to renal insufficiency ([place]) 02/19/2016  . Microalbuminuria due to type 2 diabetes mellitus ([place]) 02/19/2016  . GERD (gastroesophageal reflux disease) 11/13/2015  . Risk for falls 11/13/2015  . TIA (transient ischemic attack) 04/15/2015  . Essential hypertension 08/05/2008    Palliative Care Assessment & Plan   Patient Profile: Per H&P --> 86 year old female with history of diabetes mellitus type 2, paroxysmal A. fib with tachy/bradycardia syndrome, chronic kidney disease stage IV, chronic systolic CHF, recent UTI was found down at home on 02/22/2020. Patient apparently felt weak and tripped and fell. In the ED she was found to have altered mental status with question of pneumonia/UTI and was found to be febrile at 103F. She was started on IV antibiotics and fluids.  Recommendations/Plan:  Full Code-daughter to bring advanced directives in for review.   Continue current plan of care per medical team   Daughter remains hopeful for continued improvement with awareness of long-term challenges/prognosis.   PMT will continue to support and follow   Goals of Care and Additional Recommendations:  Limitations on Scope of Treatment: Full Scope Treatment  Code Status:    Code Status Orders  (From admission, onward)         Start     Ordered   02/22/20 2252  Full code  Continuous  02/22/20  2251        Code Status History    Date Active Date Inactive Code Status Order ID Comments User Context   11/20/2017 1734 11/21/2017 1429 Full Code [id number]  Constance Haw, MD Inpatient   02/28/2017 2139 03/07/2017 1902 Full Code [id number]  Carlyle Dolly, MD ED   Advance Care Planning Activity       Prognosis:   Guarded   Discharge Planning:  To Be Determined  Thank you for allowing the Palliative Medicine Team to assist in the care of this patient.  Time Total: 35 min.   Visit consisted of counseling and education dealing with the complex and emotionally intense issues of symptom management and palliative care in the setting of serious and potentially life-threatening illness.Greater than 50%  of this time was spent counseling and coordinating care related to the above assessment and plan.  Alda Lea, AGPCNP-BC  Palliative Medicine Team [phone]   Please contact Palliative Medicine Team phone at [phone] for questions and concerns.

## 2020-02-24 NOTE — Progress Notes (Signed)
I responded to consult for pastoral support. Thanvi was alert and grand-daughter was at bedside. Mendel Ryder mentioned how Mrs. Shannon Lucas's recent events led her to the hospital. Mykalah said she was hard of hearing because she did have her hearing aides in. Mendel Ryder states her mother will be in at a later time to be with Inez Catalina. She says that Reghan has much support at home with the rest of the family supporting them. Macaela and Mendel Ryder say they are doing well. They voiced appreciation from [place] and staff.   Chaplain Resident Fidel Levy [phone]

## 2020-02-24 NOTE — Progress Notes (Signed)
Hypoglycemic Event  CBG: 54  Treatment: 8 ounces Juice  Symptoms: No changes from baseline, patient did not report feeling like her blood sugar was low. (Usually feels very hot, sweaty, lightheaded)  Follow-up CBG: Time: 0651 CBG Result:88  RP

## 2020-02-24 NOTE — Evaluation (Signed)
Clinical/Bedside Swallow Evaluation Patient Details  Name: Shannon Lucas MRN: [id number] Date of Birth: 10-19-1931  Today's Date: 02/24/2020 Time: SLP Start Time (ACUTE ONLY): 65 SLP Stop Time (ACUTE ONLY): 1103 SLP Time Calculation (min) (ACUTE ONLY): 23 min  Past Medical History:  Past Medical History:  Diagnosis Date  . A-fib ([place])   . Arthritis    "a little bit qwhere" (02/28/2017)  . CHF (congestive heart failure) ([place])   . CKD (chronic kidney disease), stage III    [name] 02/28/2017  . Depression   . GERD (gastroesophageal reflux disease)   . High cholesterol   . Hypertension   . Melanoma of back ([place])   . Myocardial infarction [facility])    "one dr said I'd had 1" (02/28/2017)  . Stroke [facility]) ~ 2010; ~2012   "affected the right side but I fully recovered; just a light one" (02/28/2017)  . Type II diabetes mellitus ([place])    Past Surgical History:  Past Surgical History:  Procedure Laterality Date  . APPENDECTOMY    . FALSE ANEURYSM REPAIR Right 03/08/2017   Procedure: REPAIR RIGHT RADIAL FALSE ANEURYSM;  Surgeon: Rosetta Posner, MD;  Location: [facility];  Service: Vascular;  Laterality: Right;  . KNEE SURGERY Left 1970s   "I have 1/3 of my knee left in there"  . LAPAROSCOPIC CHOLECYSTECTOMY    . MELANOMA EXCISION     "back"  . PACEMAKER IMPLANT N/A 11/20/2017   Procedure: PACEMAKER IMPLANT;  Surgeon: Constance Haw, MD;  Location: [facility] CV [facility];  Service: Cardiovascular;  Laterality: N/A;  . RIGHT/LEFT HEART CATH AND CORONARY ANGIOGRAPHY N/A 03/06/2017   Procedure: Right/Left Heart Cath and Coronary Angiography;  Surgeon: Larey Dresser, MD;  Location: [facility];  Service: Cardiovascular;  Laterality: N/A;  . TUBAL LIGATION     HPI:  Pt is an 86 y.o. female with medical history significant of DM, CKD, a. Fib, CHF and recent UTI who presented with AMS after fall and was found to have broken ribs, UTI, pneumonia. CT of the head negative. CXR 4/10: Bilateral  interstitial opacities may represent edema or atypical infection. Opacity more patchy with a few more focal regions of opacity on the right. asymmetric edema vs developing pneumonia.   Assessment / Plan / Recommendation Clinical Impression  Pt was seen for bedside swallow evaluation with her family present. Pt's granddaughter denied the pt having a history of dysphagia symptoms and stated that, to her knowledge, the pt most recently had pneumonia in January, 2020 which was treated at home. Oral mechanism exam was [facility] with natural, adequate dentition. She tolerated all solids and liquids without signs or symptoms of oropharyngeal dysphagia. It is recommended that the current diet be continued. SLP will see pt once more to ensure pt is tolerating the current diet and to assess need for instrumental assessment if silent aspiration is suspected.  SLP Visit Diagnosis: Dysphagia, unspecified (R13.10)    Aspiration Risk  No limitations    Diet Recommendation Regular;Thin liquid   Liquid Administration via: Cup;Straw Medication Administration: Whole meds with liquid    Other  Recommendations Oral Care Recommendations: Oral care BID   Follow up Recommendations None      Frequency and Duration min 1 x/week  2 weeks       Prognosis Prognosis for Safe Diet Advancement: Good Barriers to Reach Goals: Cognitive deficits      Swallow Study   General Date of Onset: 02/23/20 HPI: Pt is an 86  y.o. female with medical history significant of DM, CKD, a. Fib, CHF and recent UTI who presented with AMS after fall and was found to have broken ribs, UTI, pneumonia. CT of the head negative. CXR 4/10: Bilateral interstitial opacities may represent edema or atypical infection. Opacity more patchy with a few more focal regions of opacity on the right. asymmetric edema vs developing pneumonia. Type of Study: Bedside Swallow Evaluation Previous Swallow Assessment: None Diet Prior to this Study: Regular;Thin  liquids Temperature Spikes Noted: No Respiratory Status: Room air History of Recent Intubation: No Behavior/Cognition: Alert;Cooperative;Confused;Requires cueing Oral Cavity Assessment: Within Functional Limits Oral Care Completed by SLP: No Oral Cavity - Dentition: Adequate natural dentition Vision: Functional for self-feeding Self-Feeding Abilities: Able to feed self Patient Positioning: Upright in bed;Postural control adequate for testing Baseline Vocal Quality: Normal Volitional Cough: Strong Volitional Swallow: Able to elicit    Oral/Motor/Sensory Function Overall Oral Motor/Sensory Function: Within functional limits   Ice Chips Ice chips: Within functional limits Presentation: Spoon   Thin Liquid Thin Liquid: Within functional limits Presentation: Straw;Cup    Nectar Thick Nectar Thick Liquid: Not tested   Honey Thick Honey Thick Liquid: Not tested   Puree Puree: Within functional limits Presentation: Spoon   Solid     Solid: Within functional limits Presentation: [place] [address] [place], [place], [place] Office number [phone] Pager [phone]  Horton Marshall 02/24/2020,12:57 PM

## 2020-02-24 NOTE — Progress Notes (Signed)
Patient ID: Shannon Lucas, female   DOB: [birth year], 86 y.o.   MRN: [id number]  PROGRESS NOTE    Marlenne Ridge  DZH:[id number] DOB: Jan 13, 1931 DOA: 02/22/2020 PCP: Manfred Shirts, PA   Brief Narrative:  86 year old female with history of diabetes mellitus type 2, paroxysmal A. fib with tacky/bradycardia syndrome, chronic kidney disease stage IV, chronic systolic CHF, recent UTI was found down at home on 02/22/2020.  Patient apparently felt weak and tripped and fell.  In the ED she was found to have altered mental status with question of pneumonia/UTI and was found to be febrile at 103F.  She was started on IV antibiotics and fluids.  Assessment & Plan:   Sepsis: Present on admission Acute hypoxic respiratory failure Probable bilateral pneumonia UTI -Currently on Rocephin and Zithromax.  Blood cultures negative so far.  Urine culture grew less than 10,000 colonies per mL of insignificant growth.  Required up to 4 L of oxygen by nasal cannula on presentation.  Currently on room air. -Hemodynamically stable.  Off IV fluids. -Diet as per SLP evaluation  Acute metabolic encephalopathy -Probably from above.  CT head was negative for acute abnormality.  Mental status is slightly improving, although slow to respond.  Will hold off on MRI at this time  CKD stage IV -Creatinine at baseline.  Monitor.  Off IV fluids.  Outpatient follow-up with nephrology.  Entresto and spironolactone on hold for now.  Chronic systolic CHF with history of dual-chamber pacemaker -Currently compensated.  Strict input and output.  Daily weights.  Fluid restriction.  Will notify heart failure team today about patient being in the hospital. -Continue metoprolol, Imdur, Lasix.  Entresto and spironolactone on hold for now as above.  Increased LFTs -Probably from sepsis.  Improving.  Right upper quadrant ultrasound showed probable early cirrhosis.  Hold Pravachol  Paroxysmal A. fib with tachybradycardia syndrome status post  dual-chamber pacemaker placement -Continue metoprolol and amiodarone and Xarelto  Thrombocytopenia -Probably from sepsis.  Monitor.  Hypertension -Antihypertensive plan as above.  Monitor blood pressure  Dyslipidemia -Hold Pravachol due to LFTs  Generalized deconditioning Recent falls -PT recommends SNF. -Overall prognosis is guarded to poor.  Palliative care following for goals of care discussion.  Currently full code.   DVT prophylaxis: Xarelto Code Status: Full Family Communication: Spoke to patient at bedside Disposition Plan: Patient is from home.  Probably SNF in 1 to 3 days pending clinical improvement.  Still requiring intravenous antibiotics.  Consultants: Palliative care  Procedures: None  Antimicrobials: Rocephin and Zithromax from 02/22/2020 onwards   Subjective: Patient seen and examined at bedside.  Poor historian.  More awake this morning and answers some more questions.  Denies worsening shortness of breath, chest pain, fever.  Feels weak and tired.    Objective: Vitals:   02/23/20 0737 02/23/20 2020 02/24/20 0549 02/24/20 0600  BP: 127/79 (!) 102/57 139/68   Pulse: 82 62 60   Resp: (!) 21 20 15    Temp: 98.3 F (36.8 C) 98.1 F (36.7 C) 97.6 F (36.4 C)   TempSrc: Oral Oral Oral   SpO2: 95% 91% 92%   Weight:    100.9 kg  Height:    5\' 5"  (1.651 m)    Intake/Output Summary (Last 24 hours) at 02/24/2020 0731 Last data filed at 02/23/2020 1722 Gross per 24 hour  Intake 240 ml  Output 2 ml  Net 238 ml   Filed Weights   02/22/20 1717 02/24/20 0600  Weight: 97.5 kg 100.9 kg  Examination:  General exam: No acute distress.  Looks chronically ill.  Currently on room air.  Very poor historian.   Respiratory system: Bilateral decreased breath sounds at bases with some crackles.  No wheezing  cardiovascular system: Currently rate controlled, S1-S2 heard gastrointestinal system: Abdomen is nondistended, soft and nontender.  Bowel sounds are heard   extremities: Bilateral trace lower extremity edema; no clubbing Central nervous system: More alert and awake this morning.  No focal neurological deficits. Moving extremities Skin: No ulcers or rashes Psychiatry: Has flat affect.    Data Reviewed: I have personally reviewed following labs and imaging studies  CBC: Recent Labs  Lab 02/22/20 1714 02/23/20 0047 02/23/20 0754 02/24/20 0311  WBC 10.5 9.1 8.6 5.0  NEUTROABS 10.0* 8.7* 7.9* 4.4  HGB 12.8 12.1 11.8* 10.9*  HCT 40.6 38.2 37.3 33.9*  MCV 101.0* 102.1* 100.8* 100.3*  PLT 137* 125* 102* 92*   Basic Metabolic Panel: Recent Labs  Lab 02/22/20 1714 02/23/20 0754 02/24/20 0311  NA 135 137 137  K 4.7 4.4 4.2  CL 105 106 105  CO2 20* 20* 20*  GLUCOSE 265* 189* 88  BUN 30* 32* 39*  CREATININE 2.13* 2.01* 2.13*  CALCIUM 9.0 8.4* 8.1*  MG  --  1.7 1.8   GFR: Estimated Creatinine Clearance: 22.3 mL/min (A) (by C-G formula based on SCr of 2.13 mg/dL (H)). Liver Function Tests: Recent Labs  Lab 02/22/20 1714 02/23/20 0754 02/24/20 0311  AST 57* 233* 153*  ALT 37 161* 147*  ALKPHOS 109 132* 121  BILITOT 0.8 1.1 0.8  PROT 6.4* 5.8* 5.3*  ALBUMIN 3.5 3.0* 2.7*   No results for input(s): LIPASE, AMYLASE in the last 168 hours. Recent Labs  Lab 02/24/20 0311  AMMONIA 35   Coagulation Profile: Recent Labs  Lab 02/22/20 1714  INR 1.8*   Cardiac Enzymes: No results for input(s): CKTOTAL, CKMB, CKMBINDEX, TROPONINI in the last 168 hours. BNP (last 3 results) No results for input(s): PROBNP in the last 8760 hours. HbA1C: No results for input(s): HGBA1C in the last 72 hours. CBG: Recent Labs  Lab 02/23/20 1115 02/23/20 1719 02/23/20 2126 02/24/20 0621 02/24/20 0651  GLUCAP 167* 136* 112* 54* 88   Lipid Profile: No results for input(s): CHOL, HDL, LDLCALC, TRIG, CHOLHDL, LDLDIRECT in the last 72 hours. Thyroid Function Tests: Recent Labs    02/24/20 0311  TSH 4.816*   Anemia Panel: Recent Labs     02/24/20 0311  VITAMINB12 467  FOLATE 8.7   Sepsis Labs: Recent Labs  Lab 02/22/20 1714 02/22/20 2045 02/22/20 2330 02/23/20 0754  PROCALCITON  --   --   --  32.23  LATICACIDVEN 2.0* 2.6* 3.5*  --     Recent Results (from the past 240 hour(s))  Blood Culture (routine x 2)     Status: None (Preliminary result)   Collection Time: 02/22/20  4:37 PM   Specimen: BLOOD  Result Value Ref Range Status   Specimen Description BLOOD BLOOD LEFT WRIST  Final   Special Requests   Final    BOTTLES DRAWN AEROBIC AND ANAEROBIC Blood Culture adequate volume   Culture   Final    NO GROWTH < 24 HOURS Performed at [facility], [place] [address] [place], [place] [address]    Report Status PENDING  Incomplete  Urine culture     Status: Abnormal   Collection Time: 02/22/20  6:33 PM   Specimen: In/Out Cath Urine  Result Value Ref Range Status  Specimen Description IN/OUT CATH URINE  Final   Special Requests NONE  Final   Culture (A)  Final    <10,000 COLONIES/mL INSIGNIFICANT GROWTH Performed at [facility], [place] [address], [place], [place] [address]    Report Status 02/23/2020 FINAL  Final  SARS CORONAVIRUS 2 (TAT 6-24 HRS) Nasopharyngeal Nasopharyngeal Swab     Status: None   Collection Time: 02/22/20 10:48 PM   Specimen: Nasopharyngeal Swab  Result Value Ref Range Status   SARS Coronavirus 2 NEGATIVE NEGATIVE Final    Comment: (NOTE) SARS-CoV-2 target nucleic acids are NOT DETECTED. The SARS-CoV-2 RNA is generally detectable in upper and lower respiratory specimens during the acute phase of infection. Negative results do not preclude SARS-CoV-2 infection, do not rule out co-infections with other pathogens, and should not be used as the sole basis for treatment or other patient management decisions. Negative results must be combined with clinical observations, patient history, and epidemiological information. The expected result is Negative. Fact Sheet for Patients:  SugarRoll.be Fact Sheet for Healthcare Providers: https://www.woods-mathews.com/ This test is not yet approved or cleared by the [place] FDA and  has been authorized for detection and/or diagnosis of SARS-CoV-2 by FDA under an Emergency Use Authorization (EUA). This EUA will remain  in effect (meaning this test can be used) for the duration of the COVID-19 declaration under Section 56 4(b)(1) of the Act, 21 U.S.C. section 360bbb-3(b)(1), unless the authorization is terminated or revoked sooner. Performed at [facility], [place] [address], [place], [place] [address]   Respiratory Panel by PCR     Status: None   Collection Time: 02/22/20 11:30 PM   Specimen: Nasopharyngeal Swab; Respiratory  Result Value Ref Range Status   Adenovirus NOT DETECTED NOT DETECTED Final   Coronavirus 229E NOT DETECTED NOT DETECTED Final    Comment: (NOTE) The Coronavirus on the Respiratory Panel, DOES NOT test for the novel  Coronavirus (2019 nCoV)    Coronavirus HKU1 NOT DETECTED NOT DETECTED Final   Coronavirus NL63 NOT DETECTED NOT DETECTED Final   Coronavirus OC43 NOT DETECTED NOT DETECTED Final   Metapneumovirus NOT DETECTED NOT DETECTED Final   Rhinovirus / Enterovirus NOT DETECTED NOT DETECTED Final   Influenza A NOT DETECTED NOT DETECTED Final   Influenza B NOT DETECTED NOT DETECTED Final   Parainfluenza Virus 1 NOT DETECTED NOT DETECTED Final   Parainfluenza Virus 2 NOT DETECTED NOT DETECTED Final   Parainfluenza Virus 3 NOT DETECTED NOT DETECTED Final   Parainfluenza Virus 4 NOT DETECTED NOT DETECTED Final   Respiratory Syncytial Virus NOT DETECTED NOT DETECTED Final   Bordetella pertussis NOT DETECTED NOT DETECTED Final   Chlamydophila pneumoniae NOT DETECTED NOT DETECTED Final   Mycoplasma pneumoniae NOT DETECTED NOT DETECTED Final    Comment: Performed at [facility], [place]. [address], [place], [place] [address]          Radiology Studies: CT Head Wo Contrast  Result Date: 02/22/2020 CLINICAL DATA:  Fall, found down EXAM: CT HEAD WITHOUT CONTRAST TECHNIQUE: Contiguous axial images were obtained from the base of the skull through the vertex without intravenous contrast. COMPARISON:  03/21/2019 FINDINGS: Brain: No evidence of acute infarction, hemorrhage, hydrocephalus, extra-axial collection or mass lesion/mass effect. Mild cortical atrophy. Subcortical white matter and periventricular small vessel ischemic changes. 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 Vascular: Mild intracranial atherosclerosis. Skull: Normal. Negative for fracture or focal lesion. Sinuses/Orbits: The visualized paranasal sinuses are essentially clear. The mastoid air cells  are unopacified. Other: None. IMPRESSION: No evidence of acute intracranial abnormality. Atrophy with small vessel ischemic changes. 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 Electronically Signed   By: Julian Hy M.D.   On: 02/22/2020 18:24   DG Chest Port 1 View  Result Date: 02/22/2020 CLINICAL DATA:  Confusion. EXAM: PORTABLE CHEST 1 VIEW COMPARISON:  None. FINDINGS: The right hilum is mildly prominent, possibly due to positioning. The heart, left hilum, and mediastinum are normal. Bilateral interstitial opacities. More patchy opacity on the right. No other acute abnormalities. IMPRESSION: 1. Bilateral interstitial opacities may represent edema or atypical infection. 2. Opacity is more patchy with a few more focal regions of opacity on the right. This could represent asymmetric edema versus developing pneumonia. Electronically Signed   By: Dorise Bullion III M.D   On: 02/22/2020 16:50   US Abdomen Limited RUQ  Result Date: 02/23/2020 CLINICAL DATA:  86 year old female with elevated LFTs. EXAM: ULTRASOUND ABDOMEN LIMITED RIGHT UPPER QUADRANT COMPARISON:  CT abdomen pelvis dated 12/04/2018. FINDINGS: Gallbladder: Cholecystectomy. Common bile duct: Diameter: 6 mm Liver: There is  mild coarsened liver echotexture with mild surface irregularity suggestive of early cirrhosis. Clinical correlation is recommended. Portal vein is patent on color Doppler imaging with normal direction of blood flow towards the liver. Other: Small right pleural effusion partially visualized. IMPRESSION: 1. Probable early cirrhosis.  Clinical correlation is recommended. 2. Cholecystectomy. 3. Small right pleural effusion. Electronically Signed   By: Anner Crete M.D.   On: 02/23/2020 19:30        Scheduled Meds: . acetaminophen  650 mg Oral TID  . amiodarone  200 mg Oral Daily  . furosemide  20 mg Oral Daily  . insulin glargine  55 Units Subcutaneous Daily  . isosorbide mononitrate  30 mg Oral Daily  . levothyroxine  50 mcg Oral Daily  . lidocaine  1 patch Transdermal Q24H  . metoprolol succinate  100 mg Oral Daily  . pravastatin  80 mg Oral QHS  . Rivaroxaban  15 mg Oral Q supper  . venlafaxine  75 mg Oral Q breakfast   Continuous Infusions: . azithromycin 500 mg (02/23/20 1852)  . cefTRIAXone (ROCEPHIN)  IV 2 g (02/23/20 1805)          Aline August, MD [facility] 02/24/2020, 7:31 AM

## 2020-02-24 NOTE — Consult Note (Signed)
Cardiology Consultation:   Patient ID: Shannon Lucas MRN: [id number]; DOB: 04/10/1931  Admit date: 02/22/2020 Date of Consult: 02/24/2020  Primary Lucas Provider: Manfred Shirts, PA Primary Cardiologist: Shannon Champagne, MD  Primary Electrophysiologist:  Shannon Meredith Leeds, MD    Patient Profile:   Shannon Lucas is a 86 y.o. female with a hx of DM2, CKD stage IV, Afib on Xarelto, tachybradycardia syndrome s/p Medtronic pacemaker, chronic systolic CHF with improved EF (EF 12/2018 60-65%)and recent UTI who is being seen today for the evaluation of heart failure at the request of Shannon Lucas.  History of Present Illness:   Shannon Lucas is followed by Shannon Lucas for the above cardiac issues. Patient was admitted in 12/9474 for systolic CHF. L/RHC showed normal coronaries and elevated filling pressures. Course was complicated by radial hematoma. Shee was diuresed and sent home, dry weight was 205lbs. He returned to the ED for worsening hematoma and Shannon Lucas saw found to have pseudoaneurysm and this was repaired. Echo in 08/2017 showed EF 35-40% with diffuse hypokinesis. Holter in 08/2017 showed runs of Afib/afib with aberrance and bradycardia into the 30s. She underwent Medtronic PPM placement. Patient was last seen 03/19/2019 in the HF clinic. Echo 12/2018 showed improved EF 60-65%. Patient was overall doing well. She reported using a walker since a recent fall. She has chronic 2 pillow orthopnea and weight stable around 220lbs. No changes were made.   Patient follows with Shannon Lucas for Afib and PPM last seen 01/06/20. She reported intermittent episodes of afib. Device interrogation showed normal function.   The patient presented to the ED 02/22/20 for a mechanical fall. The day before the patient had a mechanical fall went to an Shannon Lucas found to have UTI and 2 broken ribs. She given abx and sent home. The next day her daughter was with her and she had just placed patient in the recliner. The daughter  stepped out for 45 minutes and when she got back the patient was on the floor and had a fever. The patient says she tripped over her feet while trying to get up from the chair and fell but did not hit her head. She does not remember having chest pain, palpitations, lightheadedness, dizziness prior to falling. Patient was not using a cane or walker at that time. EMS was called who brought her to the ED. In the ED she was noted to have AMS. She was afebrile.  WBC 10.5, Hgb 12.8. creatinine 2.13, glucose 265, AST 57, ALT 37. Hs troponin 96>100. Lactic acid2.0>2.6>3.5. BNP 1,098. TSH 265. CXR with possible edema vs atypical infection. Ct head with no acute process. UA with UTI.  She was started on IV abx and IVF.    Past Medical History:  Diagnosis Date  . A-fib (Shannon Lucas)   . Arthritis    "a little bit qwhere" (02/28/2017)  . CHF (congestive heart failure) (Shannon Lucas)   . CKD (chronic kidney disease), stage III    Shannon Lucas 02/28/2017  . Depression   . GERD (gastroesophageal reflux disease)   . High cholesterol   . Hypertension   . Melanoma of back (Shannon Lucas)   . Myocardial infarction [facility])    "one dr said I'd had 1" (02/28/2017)  . Stroke Shannon Lucas) ~ 2010; ~2012   "affected the right side but I fully recovered; just a light one" (02/28/2017)  . Type II diabetes mellitus (Shannon Lucas)     Past Surgical History:  Procedure Laterality Date  . APPENDECTOMY    .  FALSE ANEURYSM REPAIR Right 03/08/2017   Procedure: REPAIR RIGHT RADIAL FALSE ANEURYSM;  Surgeon: Shannon Posner, MD;  Location: [facility];  Service: Vascular;  Laterality: Right;  . KNEE SURGERY Left 1970s   "I have 1/3 of my knee left in there"  . LAPAROSCOPIC CHOLECYSTECTOMY    . MELANOMA EXCISION     "back"  . PACEMAKER IMPLANT N/A 11/20/2017   Procedure: PACEMAKER IMPLANT;  Surgeon: Shannon Haw, MD;  Location: [place] [facility];  Service: Cardiovascular;  Laterality: N/A;  . RIGHT/LEFT HEART CATH AND CORONARY ANGIOGRAPHY N/A 03/06/2017   Procedure: Right/Left  Heart Cath and Coronary Angiography;  Surgeon: Shannon Dresser, MD;  Location: [place] [facility];  Service: Cardiovascular;  Laterality: N/A;  . TUBAL LIGATION       Home Medications:  Prior to Admission medications   Medication Sig Start Date End Date Taking? Authorizing Provider  acetaminophen (TYLENOL) 500 MG tablet Take 1,000 mg by mouth every 6 (six) hours as needed for moderate pain or headache.   Yes [provider]  amiodarone (PACERONE) 200 MG tablet TAKE 1 TABLET BY MOUTH Shannon Lucas Patient taking differently: Take 200 mg by mouth daily.  11/05/19  Yes Lucas, Shannon Pascal, MD  benzonatate (TESSALON) 100 MG capsule Take 100 mg by mouth 3 (three) times daily as needed for cough.   Yes [provider]  ergocalciferol (VITAMIN D2) 50000 units capsule Take 50,000 Units by mouth every Friday. At night   Yes [provider]  ferrous sulfate 325 (65 FE) MG tablet Take 1 tablet (325 mg total) by mouth 3 (three) times daily with meals. 03/19/19  Yes Shannon Dresser, MD  furosemide (LASIX) 40 MG tablet Take 0.5 tablets (20 mg total) by mouth daily. Please call for office visit [phone] 01/07/20  Yes Shannon Dresser, MD  ipratropium (ATROVENT HFA) 17 MCG/ACT inhaler Inhale 2 puffs into the lungs every 6 (six) hours as needed for wheezing. 09/06/17  Yes Shannon Dresser, MD  isosorbide mononitrate (IMDUR) 30 MG 24 hr tablet Take 1 tablet (30 mg total) by mouth daily. 03/19/19  Yes Shannon Dresser, MD  LANTUS SOLOSTAR 100 UNIT/ML Solostar Pen Inject 55 Units into the skin daily.  02/24/17  Yes [provider]  levothyroxine (SYNTHROID) 50 MCG tablet Take 50 mcg by mouth daily. 01/29/20  Yes [provider]  metoprolol succinate (TOPROL-XL) 100 MG 24 hr tablet TAKE 1 TABLET BY MOUTH DAILY TAKE WITH OR IMMDIATELY FOLLOWING A MEAL Patient taking differently: Take 100 mg by mouth daily.  02/04/19  Yes Shannon Dresser, MD  nitrofurantoin, macrocrystal-monohydrate,  (MACROBID) 100 MG capsule Take 100 mg by mouth 2 (two) times daily. 02/21/20  Yes [provider]  oxyCODONE (OXY IR/ROXICODONE) 5 MG immediate release tablet Take 5 mg by mouth every 6 (six) hours as needed for pain. 02/21/20  Yes [provider]  pravastatin (PRAVACHOL) 80 MG tablet Take 1 tablet (80 mg total) by mouth at bedtime. 03/19/19  Yes Shannon Dresser, MD  sacubitril-valsartan (ENTRESTO) 49-51 MG Take 1 tablet by mouth 2 (two) times daily. 02/13/20  Yes Sande Rives E, PA-C  spironolactone (ALDACTONE) 25 MG tablet TAKE 1/2 TABLET BY MOUTH DAILY Patient taking differently: Take 12.5 mg by mouth daily.  09/18/19  Yes Shannon Dresser, MD  venlafaxine (EFFEXOR) 75 MG tablet Take 75 mg by mouth daily.  02/03/17  Yes [provider]  XARELTO 15 MG TABS tablet TAKE 1 TABLET  BY MOUTH DAILY WITH SUPPER Patient taking differently: Take 15 mg by mouth daily with supper.  03/19/19  Yes Shannon Dresser, MD    Inpatient Medications: Scheduled Meds: . acetaminophen  650 mg Oral TID  . amiodarone  200 mg Oral Daily  . furosemide  20 mg Oral Daily  . insulin glargine  15 Units Subcutaneous Daily  . isosorbide mononitrate  30 mg Oral Daily  . levothyroxine  50 mcg Oral Daily  . lidocaine  1 patch Transdermal Q24H  . metoprolol succinate  100 mg Oral Daily  . pravastatin  80 mg Oral QHS  . Rivaroxaban  15 mg Oral Q supper  . venlafaxine  75 mg Oral Q breakfast   Continuous Infusions: . azithromycin 500 mg (02/23/20 1852)  . cefTRIAXone (ROCEPHIN)  IV 2 g (02/23/20 1805)   PRN Meds: benzonatate, diclofenac Sodium, ipratropium, oxyCODONE  Allergies:   No Known Allergies  Social History:   Social History   Socioeconomic History  . Marital status: Widowed    Spouse name: Not on file  . Number of children: Not on file  . Years of education: Not on file  . Highest education level: Not on file  Occupational History  . Not on file  Tobacco Use  . Smoking status:  Former Research scientist (life sciences)  . Smokeless tobacco: Never Used  . Tobacco comment: 02/28/2017 "only smoked in the 1960s when we went out"  Substance and Sexual Activity  . Alcohol use: No  . Drug use: No  . Sexual activity: Never  Other Topics Concern  . Not on file  Social History Narrative  . Not on file   Social Determinants of Health   Financial Resource Strain:   . Difficulty of Paying Living Expenses:   Food Insecurity:   . Worried About Charity fundraiser in the Last Year:   . Arboriculturist in the Last Year:   Transportation Needs:   . Film/video editor (Medical):   Marland Kitchen Lack of Transportation (Non-Medical):   Physical Activity:   . Days of Exercise per Week:   . Minutes of Exercise per Session:   Stress:   . Feeling of Stress :   Social Connections:   . Frequency of Communication with Friends and Family:   . Frequency of Social Gatherings with Friends and Family:   . Attends Religious Services:   . Active Member of Clubs or Organizations:   . Attends Archivist Meetings:   Marland Kitchen Marital Status:   Intimate Partner Violence:   . Fear of Current or Ex-Partner:   . Emotionally Abused:   Marland Kitchen Physically Abused:   . Sexually Abused:     Family History:   Family History  Problem Relation Age of Onset  . Diabetes Mother   . Stroke Father   . Parkinson's disease Brother      ROS:  Please see the history of present illness.  All other ROS reviewed and negative.     Physical Exam/Data:   Vitals:   02/24/20 0549 02/24/20 0600 02/24/20 1111 02/24/20 1428  BP: 139/68  (!) 141/78 (!) 112/56  Pulse: 60  70 69  Resp: 15  19 17   Temp: 97.6 F (36.4 C)  98.4 F (36.9 C) 97.9 F (36.6 C)  TempSrc: Oral  Oral Oral  SpO2: 92%  100% 96%  Weight:  100.9 kg    Height:  5\' 5"  (1.651 m)      Intake/Output Summary (Last 24  hours) at 02/24/2020 1524 Last data filed at 02/23/2020 1722 Gross per 24 hour  Intake --  Output 2 ml  Net -2 ml   Last 3 Weights 02/24/2020 02/22/2020  01/06/2020  Weight (lbs) 222 lb 7.1 oz 215 lb 215 lb  Weight (kg) 100.9 kg 97.523 kg 97.523 kg     Body mass index is 37.02 kg/m.  General:  Well nourished, well developed, in no acute distress HEENT: normal Lymph: no adenopathy Neck: no JVD Endocrine:  No thryomegaly Vascular: No carotid bruits; FA pulses 2+ bilaterally without bruits  Cardiac:  normal S1, S2; RRR; no murmur  Lungs:  clear to auscultation bilaterally, no wheezing, rhonchi or rales  Abd: soft, nontender, no hepatomegaly  Ext: no edema Musculoskeletal:  No deformities, BUE and BLE strength normal and equal Skin: warm and dry  Neuro:  CNs 2-12 intact, no focal abnormalities noted Psych:  Normal affect   EKG:  The EKG was personally reviewed and demonstrates:  Afib, HR 71 bpm, PVCs, occasional A-paced rhythm  Telemetry:  Telemetry was personally reviewed and demonstrates:  Afib, HR 60s, Intermittent paced rhythm  Relevant CV Studies:  Cardiac Cath 02/2017 1. Nonobstructive coronary artery disease, nonischemic cardiomyopathy.  2. Right and left heart filling pressures remain elevated though not markedly with primarily pulmonary venous hypertension (PVR 2.65 WU  Echo 12/2018 Study Conclusions  1. The left ventricle has normal systolic function of 84-16%. The cavity  size is normal. There is mildly increased left ventricular wall thickness.  Indeterminant diastolic function. Normal wall motion.  2. The right ventricle has normal systolic function. The cavity in normal  in size. There is no increase in right ventricular wall thickness.  3. Mildly dilated left atrial size.  4. The aortic valve is tricuspid. There is mild calcification of the  aortic valve. No aortic stenosis.  5. The aortic root and ascending aorta are normal in size and structure.  6. The inferior vena cava was dilated in size with >50% respiratory  variability.  7. The interatrial septum was not well visualized.  8. The inferior vena cava  was dilated in size with greater than 50%  respiratory variability.  9. No evidence of left ventricular regional wall motion abnormalities.  10. No significant mitral regurgitation.  11. No complete TR doppler jet so unable to estimate PA systolic pressure.  12. The mitral valve is normal in structure There is mild to moderate  mitral annular calcification present.  13. The pulmonic valve is grossly normal. Pulmonic valve regurgitation was  not assessed by color flow Doppler.    Laboratory Data:  High Sensitivity Troponin:   Recent Labs  Lab 02/22/20 2230 02/23/20 0047  TROPONINIHS 96* 100*     Chemistry Recent Labs  Lab 02/22/20 1714 02/23/20 0754 02/24/20 0311  NA 135 137 137  K 4.7 4.4 4.2  CL 105 106 105  CO2 20* 20* 20*  GLUCOSE 265* 189* 88  BUN 30* 32* 39*  CREATININE 2.13* 2.01* 2.13*  CALCIUM 9.0 8.4* 8.1*  GFRNONAA 20* 22* 20*  GFRAA 24* 25* 24*  ANIONGAP 10 11 12     Recent Labs  Lab 02/22/20 1714 02/23/20 0754 02/24/20 0311  PROT 6.4* 5.8* 5.3*  ALBUMIN 3.5 3.0* 2.7*  AST 57* 233* 153*  ALT 37 161* 147*  ALKPHOS 109 132* 121  BILITOT 0.8 1.1 0.8   Hematology Recent Labs  Lab 02/23/20 0047 02/23/20 0754 02/24/20 0311  WBC 9.1 8.6 5.0  RBC 3.74* 3.70*  3.38*  HGB 12.1 11.8* 10.9*  HCT 38.2 37.3 33.9*  MCV 102.1* 100.8* 100.3*  MCH 32.4 31.9 32.2  MCHC 31.7 31.6 32.2  RDW 14.0 14.0 14.2  PLT 125* 102* 92*   BNP Recent Labs  Lab 02/22/20 1714  BNP 1,098.3*    DDimer No results for input(s): DDIMER in the last 168 hours.   Radiology/Studies:  CT Head Wo Contrast  Result Date: 02/22/2020 CLINICAL DATA:  Fall, found down EXAM: CT HEAD WITHOUT CONTRAST TECHNIQUE: Contiguous axial images were obtained from the base of the skull through the vertex without intravenous contrast. COMPARISON:  03/21/2019 FINDINGS: Brain: No evidence of acute infarction, hemorrhage, hydrocephalus, extra-axial collection or mass lesion/mass effect. Mild  cortical atrophy. Subcortical white matter and periventricular small vessel ischemic changes. 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 Vascular: Mild intracranial atherosclerosis. Skull: Normal. Negative for fracture or focal lesion. Sinuses/Orbits: The visualized paranasal sinuses are essentially clear. The mastoid air cells are unopacified. Other: None. IMPRESSION: No evidence of acute intracranial abnormality. Atrophy with small vessel ischemic changes. 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 Electronically Signed   By: Julian Hy M.D.   On: 02/22/2020 18:24   DG Chest Port 1 View  Result Date: 02/22/2020 CLINICAL DATA:  Confusion. EXAM: PORTABLE CHEST 1 VIEW COMPARISON:  None. FINDINGS: The right hilum is mildly prominent, possibly due to positioning. The heart, left hilum, and mediastinum are normal. Bilateral interstitial opacities. More patchy opacity on the right. No other acute abnormalities. IMPRESSION: 1. Bilateral interstitial opacities may represent edema or atypical infection. 2. Opacity is more patchy with a few more focal regions of opacity on the right. This could represent asymmetric edema versus developing pneumonia. Electronically Signed   By: Dorise Bullion III M.D   On: 02/22/2020 16:50   US Abdomen Limited RUQ  Result Date: 02/23/2020 CLINICAL DATA:  86 year old female with elevated LFTs. EXAM: ULTRASOUND ABDOMEN LIMITED RIGHT UPPER QUADRANT COMPARISON:  CT abdomen pelvis dated 12/04/2018. FINDINGS: Gallbladder: Cholecystectomy. Common bile duct: Diameter: 6 mm Liver: There is mild coarsened liver echotexture with mild surface irregularity suggestive of early cirrhosis. Clinical correlation is recommended. Portal vein is patent on color Doppler imaging with normal direction of blood flow towards the liver. Other: Small right pleural effusion partially visualized. IMPRESSION: 1. Probable early cirrhosis.  Clinical correlation is recommended. 2. Cholecystectomy. 3. Small right pleural  effusion. Electronically Signed   By: Anner Crete M.D.   On: 02/23/2020 19:30   {  Assessment and Plan:   Sepsis/Encephalopathy - etiology UTI/PNA - Blood cultures negative - off IVF - abx per IM  Fall - PT recommends SNF - palliative Lucas consulted  Chronic systolic HF with improved EF/Nonischemic Cardiomyopathy - EF in 2018 25-30%, repeat echo 08/2017 EF 35-40%, most recent EF 60-65% in 12/2018 - Cath in 2018 with normal coronaries - At home latient takes Lasix 20 mg daily, Entresto 49/51 BID, Toprol XL 100 mg daily, spiro 12.5 mg daily.  - Entresto and spiro held on admission for kidney function.  - Patient appears euvolemic on exam - Start Hydralazine   CKD stage 3 - creatinine 2.13 on admission - baseline around 1.9 - Entresto and spiro held. Home lasix continued.   Paroxysmal Afib/flutter - noted to be in afib on admission - continue amiodarone 200 mg daily for now - TSH 4.8 - LFTs elevated - continue Xarelto - Follows with Shannon Lucas  Elevated LFTs - AST 947>654 - ALT 161>147 - statin previously held - RUQ [place] with  early cirrhosis - consider holding amiodarone if LFTs not continue to improve  Nonobstructive CAD per cath 02/2017 - no chest pain - Hs troponin mildly elevated 96>100. Trend is flat and not consistent with ACS - continue statin and BB - no Aspirin 2/2 to need for Xarelto  Bradycardia s/p Medtronic PPM - normal functioning device at last interrogation  HTN - home meds metoprolol, Imdur, entresto, spironolactone - entresto and spiro held as above - start Hydralazine   Hypothyroidism - TSH 4.8 - synthroid per IM   For questions or updates, please contact [facility] Please consult [email or web address] for contact info under     Signed, Latoyia Tecson Ninfa Meeker, PA-C  02/24/2020 3:24 PM

## 2020-02-25 DIAGNOSIS — N184 Chronic kidney disease, stage 4 (severe): Secondary | ICD-10-CM | POA: Diagnosis not present

## 2020-02-25 DIAGNOSIS — S2249XA Multiple fractures of ribs, unspecified side, initial encounter for closed fracture: Secondary | ICD-10-CM

## 2020-02-25 DIAGNOSIS — I5023 Acute on chronic systolic (congestive) heart failure: Secondary | ICD-10-CM | POA: Diagnosis not present

## 2020-02-25 DIAGNOSIS — I480 Paroxysmal atrial fibrillation: Secondary | ICD-10-CM | POA: Diagnosis not present

## 2020-02-25 DIAGNOSIS — J189 Pneumonia, unspecified organism: Secondary | ICD-10-CM | POA: Diagnosis not present

## 2020-02-25 LAB — GLUCOSE, CAPILLARY
Glucose-Capillary: 134 mg/dL — ABNORMAL HIGH (ref 70–99)
Glucose-Capillary: 127 mg/dL — ABNORMAL HIGH (ref 70–99)
Glucose-Capillary: 183 mg/dL — ABNORMAL HIGH (ref 70–99)
Glucose-Capillary: 199 mg/dL — ABNORMAL HIGH (ref 70–99)

## 2020-02-25 LAB — CBC WITH DIFFERENTIAL/PLATELET
Abs Immature Granulocytes: 0.03 10*3/uL (ref 0.00–0.07)
Basophils Absolute: 0 10*3/uL (ref 0.0–0.1)
Basophils Relative: 0 %
Eosinophils Absolute: 0.1 10*3/uL (ref 0.0–0.5)
Eosinophils Relative: 2 %
HCT: 35.4 % — ABNORMAL LOW (ref 36.0–46.0)
Hemoglobin: 11.6 g/dL — ABNORMAL LOW (ref 12.0–15.0)
Immature Granulocytes: 1 %
Lymphocytes Relative: 11 %
Lymphs Abs: 0.6 10*3/uL — ABNORMAL LOW (ref 0.7–4.0)
MCH: 31.5 pg (ref 26.0–34.0)
MCHC: 32.8 g/dL (ref 30.0–36.0)
MCV: 96.2 fL (ref 80.0–100.0)
Monocytes Absolute: 0.5 10*3/uL (ref 0.1–1.0)
Monocytes Relative: 9 %
Neutro Abs: 4.6 10*3/uL (ref 1.7–7.7)
Neutrophils Relative %: 77 %
Platelets: 107 10*3/uL — ABNORMAL LOW (ref 150–400)
RBC: 3.68 MIL/uL — ABNORMAL LOW (ref 3.87–5.11)
RDW: 14.1 % (ref 11.5–15.5)
WBC: 5.9 10*3/uL (ref 4.0–10.5)
nRBC: 0 % (ref 0.0–0.2)

## 2020-02-25 LAB — HEPATIC FUNCTION PANEL
ALT: 108 U/L — ABNORMAL HIGH (ref 0–44)
AST: 79 U/L — ABNORMAL HIGH (ref 15–41)
Albumin: 2.6 g/dL — ABNORMAL LOW (ref 3.5–5.0)
Alkaline Phosphatase: 137 U/L — ABNORMAL HIGH (ref 38–126)
Bilirubin, Direct: 0.4 mg/dL — ABNORMAL HIGH (ref 0.0–0.2)
Indirect Bilirubin: 0.5 mg/dL (ref 0.3–0.9)
Total Bilirubin: 0.9 mg/dL (ref 0.3–1.2)
Total Protein: 5.3 g/dL — ABNORMAL LOW (ref 6.5–8.1)

## 2020-02-25 LAB — MAGNESIUM: Magnesium: 1.8 mg/dL (ref 1.7–2.4)

## 2020-02-25 LAB — BASIC METABOLIC PANEL
Anion gap: 11 (ref 5–15)
BUN: 40 mg/dL — ABNORMAL HIGH (ref 8–23)
CO2: 20 mmol/L — ABNORMAL LOW (ref 22–32)
Calcium: 8.1 mg/dL — ABNORMAL LOW (ref 8.9–10.3)
Chloride: 102 mmol/L (ref 98–111)
Creatinine, Ser: 2.04 mg/dL — ABNORMAL HIGH (ref 0.44–1.00)
GFR calc Af Amer: 25 mL/min — ABNORMAL LOW (ref >60)
GFR calc non Af Amer: 22 mL/min — ABNORMAL LOW (ref >60)
Glucose, Bld: 135 mg/dL — ABNORMAL HIGH (ref 70–99)
Potassium: 4 mmol/L (ref 3.5–5.1)
Sodium: 133 mmol/L — ABNORMAL LOW (ref 135–145)

## 2020-02-25 MED ORDER — SENNOSIDES-DOCUSATE SODIUM 8.6-50 MG PO TABS
1.0000 | ORAL_TABLET | Freq: Two times a day (BID) | ORAL | Status: DC
  Administered 2020-02-25 – 2020-02-27 (×5): 1 via ORAL
  Filled 2020-02-25 (×5): qty 1

## 2020-02-25 MED ORDER — TRAMADOL HCL 50 MG PO TABS: 50.0000 mg | ORAL_TABLET | Freq: Four times a day (QID) | ORAL | Status: DC | PRN

## 2020-02-25 MED ORDER — POLYETHYLENE GLYCOL 3350 17 G PO PACK: 17.0000 g | PACK | Freq: Every day | ORAL | Status: DC | PRN

## 2020-02-25 MED ORDER — FUROSEMIDE 10 MG/ML IJ SOLN
60.0000 mg | Freq: Once | INTRAMUSCULAR | Status: AC
  Administered 2020-02-25: 60 mg via INTRAVENOUS
  Filled 2020-02-25: qty 6

## 2020-02-25 MED ORDER — FUROSEMIDE 40 MG PO TABS: 40.0000 mg | ORAL_TABLET | Freq: Every day | ORAL | Status: DC

## 2020-02-25 MED ORDER — FUROSEMIDE 10 MG/ML IJ SOLN
40.0000 mg | Freq: Once | INTRAMUSCULAR | Status: AC
  Administered 2020-02-25: 07:00:00 40 mg via INTRAVENOUS
  Filled 2020-02-25: qty 4

## 2020-02-25 NOTE — Progress Notes (Signed)
Patient ID: Shannon Lucas, female   DOB: [birth year], 86 y.o.   MRN: [id number]  PROGRESS NOTE    Shannon Lucas  URK:[id number] DOB: March 03, 1931 DOA: 02/22/2020 PCP: Manfred Shirts, PA   Brief Narrative:  86 year old female with history of diabetes mellitus type 2, paroxysmal A. fib with tacky/bradycardia syndrome, chronic kidney disease stage IV, chronic systolic CHF, recent UTI was found down at home on 02/22/2020.  Patient apparently felt weak and tripped and fell.  In the ED she was found to have altered mental status with question of pneumonia/UTI and was found to be febrile at 103F.  She was started on IV antibiotics and fluids.  Assessment & Plan:   Sepsis: Present on admission Acute hypoxic respiratory failure Probable bilateral pneumonia UTI -Currently on Rocephin and Zithromax.  Blood cultures negative so far.  Urine culture grew less than 10,000 colonies per mL of insignificant growth.  Required up to 4 L of oxygen by nasal cannula on presentation.  Currently requiring 2 L oxygen intermittently.  Wean off as able. -Hemodynamically stable.  Off IV fluids. -Diet as per SLP evaluation  Acute metabolic encephalopathy -Probably from above.  CT head was negative for acute abnormality.  Mental status has much improved.  Will hold off on MRI at this time  CKD stage IV -Creatinine at baseline.  Monitor.  Off IV fluids.  Outpatient follow-up with nephrology.  Entresto and spironolactone on hold for now.  Acute on chronic systolic CHF with history of dual-chamber pacemaker -Strict input and output.  Daily weights.  Fluid restriction.  -Continue metoprolol, hydralazine and Imdur.  Entresto and spironolactone on hold for now as above. -Cardiology following and patient is getting IV Lasix  Fall and rib fractures -Continue pain management.  We will try oral/IV pain medications first and if they do not work, might have to consider nerve block.  Increased LFTs -Probably from sepsis.  Improving.   Right upper quadrant ultrasound showed probable early cirrhosis. Pravachol on hold  Paroxysmal A. fib with tachybradycardia syndrome status post dual-chamber pacemaker placement -Continue metoprolol and amiodarone and Xarelto  Thrombocytopenia -Probably from sepsis.  Monitor.  Hypertension -Antihypertensive plan as above.  Monitor blood pressure  Dyslipidemia -Hold Pravachol due to LFTs  Generalized deconditioning Recent falls -PT recommends SNF. -Overall prognosis is guarded to poor.  Palliative care following for goals of care discussion.  Currently full code. -Spoke with daughter on phone on 02/24/2020 who thought that she would like to see her mother's improvement with physical therapy before she would decide to place her in SNF.   DVT prophylaxis: Xarelto Code Status: Full Family Communication: Spoke to daughter on phone on 02/24/2020 Disposition Plan: Patient is from home.  Probably SNF if family agrees in 1 to 2 days pending clinical improvement.  Still requiring intravenous antibiotics.  Consultants: Palliative care  Procedures: None  Antimicrobials: Rocephin and Zithromax from 02/22/2020 onwards   Subjective: Patient seen and examined at bedside.  Poor historian.  Complains of rib pain while taking deep breaths.  No worsening shortness of breath, fever or vomiting reported. Objective: Vitals:   02/24/20 2329 02/25/20 0417 02/25/20 0434 02/25/20 0840  BP: (!) 118/56 136/73  129/72  Pulse: 62 70  76  Resp: (!) 24   20  Temp:  97.6 F (36.4 C)  (!) 97.5 F (36.4 C)  TempSrc:  Oral  Oral  SpO2: 91% 95%  97%  Weight:   101.2 kg   Height:  Intake/Output Summary (Last 24 hours) at 02/25/2020 1055 Last data filed at 02/25/2020 0900 Gross per 24 hour  Intake 460 ml  Output 1300 ml  Net -840 ml   Filed Weights   02/22/20 1717 02/24/20 0600 02/25/20 0434  Weight: 97.5 kg 100.9 kg 101.2 kg    Examination:  General exam: No distress.  Chronically  ill-appearing.  Currently on room air.  Poor historian. Respiratory system: Bilateral decreased breath sounds at bases with scattered crackles.   Cardiovascular system: S1-S2 heard, rate controlled gastrointestinal system: Abdomen is nondistended, soft and nontender.  Normal bowel sounds heard  extremities: No cyanosis.  Mild bilateral lower extremity edema present Central nervous system: Awake, poor historian. no focal neurological deficits. Moving extremities Skin: No lesions or ulcers Psychiatry: Flat affect   Data Reviewed: I have personally reviewed following labs and imaging studies  CBC: Recent Labs  Lab 02/22/20 1714 02/23/20 0047 02/23/20 0754 02/24/20 0311 02/25/20 0245  WBC 10.5 9.1 8.6 5.0 5.9  NEUTROABS 10.0* 8.7* 7.9* 4.4 4.6  HGB 12.8 12.1 11.8* 10.9* 11.6*  HCT 40.6 38.2 37.3 33.9* 35.4*  MCV 101.0* 102.1* 100.8* 100.3* 96.2  PLT 137* 125* 102* 92* 570*   Basic Metabolic Panel: Recent Labs  Lab 02/22/20 1714 02/23/20 0754 02/24/20 0311 02/25/20 0245  NA 135 137 137 133*  K 4.7 4.4 4.2 4.0  CL 105 106 105 102  CO2 20* 20* 20* 20*  GLUCOSE 265* 189* 88 135*  BUN 30* 32* 39* 40*  CREATININE 2.13* 2.01* 2.13* 2.04*  CALCIUM 9.0 8.4* 8.1* 8.1*  MG  --  1.7 1.8 1.8   GFR: Estimated Creatinine Clearance: 23.3 mL/min (A) (by C-G formula based on SCr of 2.04 mg/dL (H)). Liver Function Tests: Recent Labs  Lab 02/22/20 1714 02/23/20 0754 02/24/20 0311 02/25/20 0757  AST 57* 233* 153* 79*  ALT 37 161* 147* 108*  ALKPHOS 109 132* 121 137*  BILITOT 0.8 1.1 0.8 0.9  PROT 6.4* 5.8* 5.3* 5.3*  ALBUMIN 3.5 3.0* 2.7* 2.6*   No results for input(s): LIPASE, AMYLASE in the last 168 hours. Recent Labs  Lab 02/24/20 0311  AMMONIA 35   Coagulation Profile: Recent Labs  Lab 02/22/20 1714  INR 1.8*   Cardiac Enzymes: No results for input(s): CKTOTAL, CKMB, CKMBINDEX, TROPONINI in the last 168 hours. BNP (last 3 results) No results for input(s): PROBNP  in the last 8760 hours. HbA1C: No results for input(s): HGBA1C in the last 72 hours. CBG: Recent Labs  Lab 02/23/20 2126 02/24/20 0621 02/24/20 0651 02/24/20 1114 02/25/20 0626  GLUCAP 112* 54* 88 123* 127*   Lipid Profile: No results for input(s): CHOL, HDL, LDLCALC, TRIG, CHOLHDL, LDLDIRECT in the last 72 hours. Thyroid Function Tests: Recent Labs    02/24/20 0311  TSH 4.816*   Anemia Panel: Recent Labs    02/24/20 0311  VITAMINB12 467  FOLATE 8.7   Sepsis Labs: Recent Labs  Lab 02/22/20 1714 02/22/20 2045 02/22/20 2330 02/23/20 0754  PROCALCITON  --   --   --  32.23  LATICACIDVEN 2.0* 2.6* 3.5*  --     Recent Results (from the past 240 hour(s))  Blood Culture (routine x 2)     Status: None (Preliminary result)   Collection Time: 02/22/20  4:37 PM   Specimen: BLOOD  Result Value Ref Range Status   Specimen Description BLOOD BLOOD LEFT WRIST  Final   Special Requests   Final    BOTTLES DRAWN AEROBIC AND ANAEROBIC  Blood Culture adequate volume   Culture   Final    NO GROWTH 3 DAYS Performed at [facility], [place] [address], [place], [place] [address]    Report Status PENDING  Incomplete  Urine culture     Status: Abnormal   Collection Time: 02/22/20  6:33 PM   Specimen: In/Out Cath Urine  Result Value Ref Range Status   Specimen Description IN/OUT CATH URINE  Final   Special Requests NONE  Final   Culture (A)  Final    <10,000 COLONIES/mL INSIGNIFICANT GROWTH Performed at [facility], [place] [address], [place], [place] [address]    Report Status 02/23/2020 FINAL  Final  SARS CORONAVIRUS 2 (TAT 6-24 HRS) Nasopharyngeal Nasopharyngeal Swab     Status: None   Collection Time: 02/22/20 10:48 PM   Specimen: Nasopharyngeal Swab  Result Value Ref Range Status   SARS Coronavirus 2 NEGATIVE NEGATIVE Final    Comment: (NOTE) SARS-CoV-2 target nucleic acids are NOT DETECTED. The SARS-CoV-2 RNA is generally detectable in upper and lower respiratory  specimens during the acute phase of infection. Negative results do not preclude SARS-CoV-2 infection, do not rule out co-infections with other pathogens, and should not be used as the sole basis for treatment or other patient management decisions. Negative results must be combined with clinical observations, patient history, and epidemiological information. The expected result is Negative. Fact Sheet for Patients: SugarRoll.be Fact Sheet for Healthcare Providers: https://www.woods-mathews.com/ This test is not yet approved or cleared by the [place] FDA and  has been authorized for detection and/or diagnosis of SARS-CoV-2 by FDA under an Emergency Use Authorization (EUA). This EUA will remain  in effect (meaning this test can be used) for the duration of the COVID-19 declaration under Section 56 4(b)(1) of the Act, 21 U.S.C. section 360bbb-3(b)(1), unless the authorization is terminated or revoked sooner. Performed at [facility], [place] [address], [place], [place] [address]   Respiratory Panel by PCR     Status: None   Collection Time: 02/22/20 11:30 PM   Specimen: Nasopharyngeal Swab; Respiratory  Result Value Ref Range Status   Adenovirus NOT DETECTED NOT DETECTED Final   Coronavirus 229E NOT DETECTED NOT DETECTED Final    Comment: (NOTE) The Coronavirus on the Respiratory Panel, DOES NOT test for the novel  Coronavirus (2019 nCoV)    Coronavirus HKU1 NOT DETECTED NOT DETECTED Final   Coronavirus NL63 NOT DETECTED NOT DETECTED Final   Coronavirus OC43 NOT DETECTED NOT DETECTED Final   Metapneumovirus NOT DETECTED NOT DETECTED Final   Rhinovirus / Enterovirus NOT DETECTED NOT DETECTED Final   Influenza A NOT DETECTED NOT DETECTED Final   Influenza B NOT DETECTED NOT DETECTED Final   Parainfluenza Virus 1 NOT DETECTED NOT DETECTED Final   Parainfluenza Virus 2 NOT DETECTED NOT DETECTED Final   Parainfluenza Virus 3 NOT  DETECTED NOT DETECTED Final   Parainfluenza Virus 4 NOT DETECTED NOT DETECTED Final   Respiratory Syncytial Virus NOT DETECTED NOT DETECTED Final   Bordetella pertussis NOT DETECTED NOT DETECTED Final   Chlamydophila pneumoniae NOT DETECTED NOT DETECTED Final   Mycoplasma pneumoniae NOT DETECTED NOT DETECTED Final    Comment: Performed at [facility], [place]. [address], [place], [place] [address]  Blood Culture (routine x 2)     Status: None (Preliminary result)   Collection Time: 02/23/20 12:47 AM   Specimen: BLOOD  Result Value Ref Range Status   Specimen Description BLOOD LEFT  ARM  Final   Special Requests   Final    BOTTLES DRAWN AEROBIC AND ANAEROBIC Blood Culture adequate volume   Culture   Final    NO GROWTH 2 DAYS Performed at [facility], [address], [place], [place] [address]    Report Status PENDING  Incomplete         Radiology Studies: US Abdomen Limited RUQ  Result Date: 02/23/2020 CLINICAL DATA:  86 year old female with elevated LFTs. EXAM: ULTRASOUND ABDOMEN LIMITED RIGHT UPPER QUADRANT COMPARISON:  CT abdomen pelvis dated 12/04/2018. FINDINGS: Gallbladder: Cholecystectomy. Common bile duct: Diameter: 6 mm Liver: There is mild coarsened liver echotexture with mild surface irregularity suggestive of early cirrhosis. Clinical correlation is recommended. Portal vein is patent on color Doppler imaging with normal direction of blood flow towards the liver. Other: Small right pleural effusion partially visualized. IMPRESSION: 1. Probable early cirrhosis.  Clinical correlation is recommended. 2. Cholecystectomy. 3. Small right pleural effusion. Electronically Signed   By: Anner Crete M.D.   On: 02/23/2020 19:30        Scheduled Meds: . acetaminophen  650 mg Oral TID  . amiodarone  200 mg Oral Daily  . furosemide  60 mg Intravenous Once  . hydrALAZINE  100 mg Oral Q8H  . insulin glargine  15 Units Subcutaneous Daily  . isosorbide mononitrate  30 mg  Oral Daily  . levothyroxine  50 mcg Oral Daily  . lidocaine  1 patch Transdermal Q24H  . metoprolol succinate  100 mg Oral Daily  . Rivaroxaban  15 mg Oral Q supper  . venlafaxine  75 mg Oral Q breakfast   Continuous Infusions: . azithromycin 500 mg (02/24/20 1851)  . cefTRIAXone (ROCEPHIN)  IV 2 g (02/24/20 1744)          Aline August, MD [facility] 02/25/2020, 10:55 AM

## 2020-02-25 NOTE — Progress Notes (Signed)
Physical Therapy Treatment Patient Details Name: Shannon Lucas MRN: [id number] DOB: 06/19/1931 Today's Date: 02/25/2020    History of Present Illness 86 year old female with history of diabetes mellitus type 2, paroxysmal A. fib with tacky/bradycardia syndrome, chronic kidney disease stage IV, chronic systolic CHF, recent UTI was found down at home on 02/22/2020.  Patient apparently felt weak and tripped and fell.  In the ED she was found to have altered mental status with question of pneumonia/UTI and was found to be febrile at 103F.    PT Comments    Pt pleasant attempting to eat breakfast in supine with tray too high and pt slumped in bed. Pt educated for bed mobility as she reports pivoting to right for [facility] throughout day and educated for transfer to right due to rib pain. Pt with slowly increasing activity tolerance and able to maintain SpO2 95% on RA with HR 84-88. Will continue to follow and encouraged walking to bathroom with nursing.     Follow Up Recommendations  SNF;Supervision/Assistance - 24 hour     Equipment Recommendations  None recommended by PT    Recommendations for Other Services       Precautions / Restrictions Precautions Precautions: Fall    Mobility  Bed Mobility Overal bed mobility: Needs Assistance Bed Mobility: Rolling;Sidelying to Sit Rolling: Min guard Sidelying to sit: Min guard       General bed mobility comments: cues for sequence to roll toward left with use of rail to rise with HOB 40degrees  Transfers Overall transfer level: Needs assistance   Transfers: Sit to/from Stand Sit to Stand: Min assist         General transfer comment: cues for hand placement  Ambulation/Gait Ambulation/Gait assistance: Min guard Gait Distance (Feet): 35 Feet Assistive device: Rolling walker (2 wheeled) Gait Pattern/deviations: Step-through pattern;Decreased stride length;Trunk flexed   Gait velocity interpretation: >2.62 ft/sec, indicative of community  ambulatory General Gait Details: cues for posture and progression with pt declining further distance   Stairs             Wheelchair Mobility    Modified Rankin (Stroke Patients Only)       Balance Overall balance assessment: Needs assistance;History of Falls   Sitting balance-Leahy Scale: Good     Standing balance support: Bilateral upper extremity supported;Single extremity supported;During functional activity Standing balance-Leahy Scale: Poor                              Cognition Arousal/Alertness: Awake/alert Behavior During Therapy: WFL for tasks assessed/performed Overall Cognitive Status: Impaired/Different from baseline                               Problem Solving: Slow processing General Comments: pt reports she has been hallucinating that grandchildren tore up her house and that she died      Exercises General Exercises - Lower Extremity Long Arc Quad: AROM;Both;Seated;15 reps Hip Flexion/Marching: AROM;Both;Seated;10 reps    General Comments        Pertinent Vitals/Pain Pain Assessment: 0-10 Pain Score: 5  Pain Location: right lower ribcage Pain Descriptors / Indicators: Discomfort;Grimacing Pain Intervention(s): Limited activity within patient's tolerance;Monitored during session;Repositioned    Home Living                      Prior Function  PT Goals (current goals can now be found in the care plan section) Progress towards PT goals: Progressing toward goals    Frequency    Min 3X/week      PT Plan Current plan remains appropriate    Co-evaluation              AM-PAC PT "6 Clicks" Mobility   Outcome Measure  Help needed turning from your back to your side while in a flat bed without using bedrails?: A Little Help needed moving from lying on your back to sitting on the side of a flat bed without using bedrails?: A Little Help needed moving to and from a bed to a chair  (including a wheelchair)?: A Little Help needed standing up from a chair using your arms (e.g., wheelchair or bedside chair)?: A Little Help needed to walk in hospital room?: A Little Help needed climbing 3-5 steps with a railing? : A Lot 6 Click Score: 17    End of Session   Activity Tolerance: Patient tolerated treatment well Patient left: in chair;with call bell/phone within reach;with chair alarm set Nurse Communication: Mobility status PT Visit Diagnosis: Repeated falls (R29.6);Muscle weakness (generalized) (M62.81);Difficulty in walking, not elsewhere classified (R26.2);Pain     Time: 6333-5456 PT Time Calculation (min) (ACUTE ONLY): 21 min  Charges:  $Therapeutic Activity: 8-22 mins                     Yaqueline Gutter P, PT Acute Rehabilitation Services Pager: [phone] Office: [place] 02/25/2020, 1:40 PM

## 2020-02-25 NOTE — Progress Notes (Signed)
Cardiology Progress Note  Patient ID: Shannon Lucas MRN: [id number] DOB: 07/07/1931 Date of Encounter: 02/25/2020  Primary Cardiologist: Loralie Champagne, MD  Subjective  1 L urine output. In chair today. Doing well. Back in NSR.   ROS:  All other ROS reviewed and negative. Pertinent positives noted in the HPI.     Inpatient Medications  Scheduled Meds: . acetaminophen  650 mg Oral TID  . amiodarone  200 mg Oral Daily  . hydrALAZINE  100 mg Oral Q8H  . insulin glargine  15 Units Subcutaneous Daily  . isosorbide mononitrate  30 mg Oral Daily  . levothyroxine  50 mcg Oral Daily  . lidocaine  1 patch Transdermal Q24H  . metoprolol succinate  100 mg Oral Daily  . Rivaroxaban  15 mg Oral Q supper  . venlafaxine  75 mg Oral Q breakfast   Continuous Infusions: . azithromycin 500 mg (02/24/20 1851)  . cefTRIAXone (ROCEPHIN)  IV 2 g (02/24/20 1744)   PRN Meds: benzonatate, diclofenac Sodium, ipratropium, oxyCODONE   Vital Signs   Vitals:   02/24/20 2329 02/25/20 0417 02/25/20 0434 02/25/20 0840  BP: (!) 118/56 136/73  129/72  Pulse: 62 70  76  Resp: (!) 24   20  Temp:  97.6 F (36.4 C)  (!) 97.5 F (36.4 C)  TempSrc:  Oral  Oral  SpO2: 91% 95%  97%  Weight:   101.2 kg   Height:        Intake/Output Summary (Last 24 hours) at 02/25/2020 1034 Last data filed at 02/25/2020 0900 Gross per 24 hour  Intake 460 ml  Output 1300 ml  Net -840 ml   Last 3 Weights 02/25/2020 02/24/2020 02/22/2020  Weight (lbs) 223 lb 3.2 oz 222 lb 7.1 oz 215 lb  Weight (kg) 101.243 kg 100.9 kg 97.523 kg      Telemetry  Overnight telemetry shows NSR 80-90 bpm, which I personally reviewed.   Physical Exam   Vitals:   02/24/20 2329 02/25/20 0417 02/25/20 0434 02/25/20 0840  BP: (!) 118/56 136/73  129/72  Pulse: 62 70  76  Resp: (!) 24   20  Temp:  97.6 F (36.4 C)  (!) 97.5 F (36.4 C)  TempSrc:  Oral  Oral  SpO2: 91% 95%  97%  Weight:   101.2 kg   Height:         Intake/Output Summary  (Last 24 hours) at 02/25/2020 1034 Last data filed at 02/25/2020 0900 Gross per 24 hour  Intake 460 ml  Output 1300 ml  Net -840 ml    Last 3 Weights 02/25/2020 02/24/2020 02/22/2020  Weight (lbs) 223 lb 3.2 oz 222 lb 7.1 oz 215 lb  Weight (kg) 101.243 kg 100.9 kg 97.523 kg    Body mass index is 37.14 kg/m.   General: Well nourished, well developed, in no acute distress Head: Atraumatic, normal size  Eyes: PEERLA, EOMI  Neck: Supple, no JVD Endocrine: No thryomegaly Cardiac: Normal S1, S2; RRR; no murmurs, rubs, or gallops Lungs: Clear to auscultation bilaterally, no wheezing, rhonchi or rales  Abd: Soft, nontender, no hepatomegaly  Ext: trace edema  Musculoskeletal: No deformities, BUE and BLE strength normal and equal Skin: Warm and dry, no rashes   Neuro: Alert and oriented to person, place, time, and situation, CNII-XII grossly intact, no focal deficits  Psych: Normal mood and affect   Labs  High Sensitivity Troponin:   Recent Labs  Lab 02/22/20 2230 02/23/20 0047 02/24/20 1621  TROPONINIHS 96*  100* 40*     Cardiac EnzymesNo results for input(s): TROPONINI in the last 168 hours. No results for input(s): TROPIPOC in the last 168 hours.  Chemistry Recent Labs  Lab 02/23/20 0754 02/24/20 0311 02/25/20 0245 02/25/20 0757  NA 137 137 133*  --   K 4.4 4.2 4.0  --   CL 106 105 102  --   CO2 20* 20* 20*  --   GLUCOSE 189* 88 135*  --   BUN 32* 39* 40*  --   CREATININE 2.01* 2.13* 2.04*  --   CALCIUM 8.4* 8.1* 8.1*  --   PROT 5.8* 5.3*  --  5.3*  ALBUMIN 3.0* 2.7*  --  2.6*  AST 233* 153*  --  79*  ALT 161* 147*  --  108*  ALKPHOS 132* 121  --  137*  BILITOT 1.1 0.8  --  0.9  GFRNONAA 22* 20* 22*  --   GFRAA 25* 24* 25*  --   ANIONGAP 11 12 11   --     Hematology Recent Labs  Lab 02/23/20 0754 02/24/20 0311 02/25/20 0245  WBC 8.6 5.0 5.9  RBC 3.70* 3.38* 3.68*  HGB 11.8* 10.9* 11.6*  HCT 37.3 33.9* 35.4*  MCV 100.8* 100.3* 96.2  MCH 31.9 32.2 31.5  MCHC  31.6 32.2 32.8  RDW 14.0 14.2 14.1  PLT 102* 92* 107*   BNP Recent Labs  Lab 02/22/20 1714  BNP 1,098.3*    DDimer No results for input(s): DDIMER in the last 168 hours.   Radiology  US Abdomen Limited RUQ  Result Date: 02/23/2020 CLINICAL DATA:  86 year old female with elevated LFTs. EXAM: ULTRASOUND ABDOMEN LIMITED RIGHT UPPER QUADRANT COMPARISON:  CT abdomen pelvis dated 12/04/2018. FINDINGS: Gallbladder: Cholecystectomy. Common bile duct: Diameter: 6 mm Liver: There is mild coarsened liver echotexture with mild surface irregularity suggestive of early cirrhosis. Clinical correlation is recommended. Portal vein is patent on color Doppler imaging with normal direction of blood flow towards the liver. Other: Small right pleural effusion partially visualized. IMPRESSION: 1. Probable early cirrhosis.  Clinical correlation is recommended. 2. Cholecystectomy. 3. Small right pleural effusion. Electronically Signed   By: Anner Crete M.D.   On: 02/23/2020 19:30    Cardiac Studies  TTE 12/18/2018 1. The left ventricle has normal systolic function of 99-83%. The cavity  size is normal. There is mildly increased left ventricular wall thickness.  Indeterminant diastolic function. Normal wall motion.  2. The right ventricle has normal systolic function. The cavity in normal  in size. There is no increase in right ventricular wall thickness.  3. Mildly dilated left atrial size.  4. The aortic valve is tricuspid. There is mild calcification of the  aortic valve. No aortic stenosis.  5. The aortic root and ascending aorta are normal in size and structure.  6. The inferior vena cava was dilated in size with >50% respiratory  variability.  7. The interatrial septum was not well visualized.  8. The inferior vena cava was dilated in size with greater than 50%  respiratory variability.  9. No evidence of left ventricular regional wall motion abnormalities.  10. No significant mitral  regurgitation.  11. No complete TR doppler jet so unable to estimate PA systolic pressure.  12. The mitral valve is normal in structure There is mild to moderate  mitral annular calcification present.  13. The pulmonic valve is grossly normal. Pulmonic valve regurgitation was  not assessed by color flow Doppler.   Patient Profile  Mrs. Oehlert is an 86 yo F with history of CKD IV, paroxysmal Afib, SSS s/p ppm, systolic HF with recovery of EF 60-65% who was admitted with fall, rib fractures, sepsis 2/2 UTI. Cardiology consulted for heart failure management.   Assessment & Plan   1. Acute on Chronic Systolic HF (recovery of EF), 60-65% -good response to 40 IV lasix. Will give 60 mg IV lasix today  -no Ace/ARB/ARNI due to CKD IV -continue metoprolol succinate 100 mg QD -continue hydralazine 100 mg TID and imdur 30 daily  -volume status acceptable  2. Rib Fracture -consider nerve block as she is in a lot of pain  3. Atrial fibrillation, paroxysmal -back in NSR -continue xarelto -went in Afib due to volume up and better with diuresis  For questions or updates, please contact [place] Please consult [email or web address] for contact info under   Time Spent with Patient: I have spent a total of 25 minutes with patient reviewing hospital notes, telemetry, EKGs, labs and examining the patient as well as establishing an assessment and plan that was discussed with the patient.  > 50% of time was spent in direct patient care.    Signed, Addison Naegeli. Audie Box, [place]  02/25/2020 10:34 AM

## 2020-02-26 DIAGNOSIS — J189 Pneumonia, unspecified organism: Secondary | ICD-10-CM | POA: Diagnosis not present

## 2020-02-26 DIAGNOSIS — E1165 Type 2 diabetes mellitus with hyperglycemia: Secondary | ICD-10-CM

## 2020-02-26 DIAGNOSIS — I495 Sick sinus syndrome: Secondary | ICD-10-CM

## 2020-02-26 DIAGNOSIS — I5023 Acute on chronic systolic (congestive) heart failure: Secondary | ICD-10-CM | POA: Diagnosis not present

## 2020-02-26 DIAGNOSIS — R748 Abnormal levels of other serum enzymes: Secondary | ICD-10-CM

## 2020-02-26 DIAGNOSIS — J9601 Acute respiratory failure with hypoxia: Secondary | ICD-10-CM | POA: Diagnosis not present

## 2020-02-26 DIAGNOSIS — Z7901 Long term (current) use of anticoagulants: Secondary | ICD-10-CM

## 2020-02-26 DIAGNOSIS — D631 Anemia in chronic kidney disease: Secondary | ICD-10-CM

## 2020-02-26 DIAGNOSIS — R5381 Other malaise: Secondary | ICD-10-CM

## 2020-02-26 DIAGNOSIS — N390 Urinary tract infection, site not specified: Secondary | ICD-10-CM | POA: Diagnosis not present

## 2020-02-26 DIAGNOSIS — D696 Thrombocytopenia, unspecified: Secondary | ICD-10-CM

## 2020-02-26 DIAGNOSIS — N184 Chronic kidney disease, stage 4 (severe): Secondary | ICD-10-CM

## 2020-02-26 LAB — CBC WITH DIFFERENTIAL/PLATELET
Abs Immature Granulocytes: 0.04 10*3/uL (ref 0.00–0.07)
Basophils Absolute: 0 10*3/uL (ref 0.0–0.1)
Basophils Relative: 0 %
Eosinophils Absolute: 0.1 10*3/uL (ref 0.0–0.5)
Eosinophils Relative: 1 %
HCT: 38.7 % (ref 36.0–46.0)
Hemoglobin: 12.9 g/dL (ref 12.0–15.0)
Immature Granulocytes: 1 %
Lymphocytes Relative: 12 %
Lymphs Abs: 1 10*3/uL (ref 0.7–4.0)
MCH: 32.1 pg (ref 26.0–34.0)
MCHC: 33.3 g/dL (ref 30.0–36.0)
MCV: 96.3 fL (ref 80.0–100.0)
Monocytes Absolute: 0.6 10*3/uL (ref 0.1–1.0)
Monocytes Relative: 7 %
Neutro Abs: 6.7 10*3/uL (ref 1.7–7.7)
Neutrophils Relative %: 79 %
Platelets: 142 10*3/uL — ABNORMAL LOW (ref 150–400)
RBC: 4.02 MIL/uL (ref 3.87–5.11)
RDW: 14.2 % (ref 11.5–15.5)
WBC: 8.5 10*3/uL (ref 4.0–10.5)
nRBC: 0 % (ref 0.0–0.2)

## 2020-02-26 LAB — GLUCOSE, CAPILLARY
Glucose-Capillary: 167 mg/dL — ABNORMAL HIGH (ref 70–99)
Glucose-Capillary: 197 mg/dL — ABNORMAL HIGH (ref 70–99)
Glucose-Capillary: 219 mg/dL — ABNORMAL HIGH (ref 70–99)
Glucose-Capillary: 190 mg/dL — ABNORMAL HIGH (ref 70–99)

## 2020-02-26 LAB — COMPREHENSIVE METABOLIC PANEL
ALT: 84 U/L — ABNORMAL HIGH (ref 0–44)
AST: 44 U/L — ABNORMAL HIGH (ref 15–41)
Albumin: 2.8 g/dL — ABNORMAL LOW (ref 3.5–5.0)
Alkaline Phosphatase: 153 U/L — ABNORMAL HIGH (ref 38–126)
Anion gap: 11 (ref 5–15)
BUN: 39 mg/dL — ABNORMAL HIGH (ref 8–23)
CO2: 23 mmol/L (ref 22–32)
Calcium: 8.7 mg/dL — ABNORMAL LOW (ref 8.9–10.3)
Chloride: 101 mmol/L (ref 98–111)
Creatinine, Ser: 2.21 mg/dL — ABNORMAL HIGH (ref 0.44–1.00)
GFR calc Af Amer: 23 mL/min — ABNORMAL LOW (ref >60)
GFR calc non Af Amer: 20 mL/min — ABNORMAL LOW (ref >60)
Glucose, Bld: 180 mg/dL — ABNORMAL HIGH (ref 70–99)
Potassium: 3.9 mmol/L (ref 3.5–5.1)
Sodium: 135 mmol/L (ref 135–145)
Total Bilirubin: 0.8 mg/dL (ref 0.3–1.2)
Total Protein: 5.9 g/dL — ABNORMAL LOW (ref 6.5–8.1)

## 2020-02-26 LAB — HEMOGLOBIN A1C
Hgb A1c MFr Bld: 7.1 % — ABNORMAL HIGH (ref 4.8–5.6)
Mean Plasma Glucose: 157.07 mg/dL

## 2020-02-26 LAB — MAGNESIUM: Magnesium: 1.8 mg/dL (ref 1.7–2.4)

## 2020-02-26 MED ORDER — INSULIN ASPART 100 UNIT/ML ~~LOC~~ SOLN
0.0000 [IU] | Freq: Three times a day (TID) | SUBCUTANEOUS | Status: DC
  Administered 2020-02-26: 17:00:00 3 [IU] via SUBCUTANEOUS
  Administered 2020-02-27: 1 [IU] via SUBCUTANEOUS
  Administered 2020-02-27 (×2): 3 [IU] via SUBCUTANEOUS

## 2020-02-26 MED ORDER — SENNOSIDES-DOCUSATE SODIUM 8.6-50 MG PO TABS: 1.0000 | ORAL_TABLET | Freq: Two times a day (BID) | ORAL | Status: DC | PRN

## 2020-02-26 MED ORDER — FUROSEMIDE 20 MG PO TABS
20.0000 mg | ORAL_TABLET | Freq: Every day | ORAL | Status: DC
  Administered 2020-02-26 – 2020-02-27 (×2): 20 mg via ORAL
  Filled 2020-02-26 (×2): qty 1

## 2020-02-26 MED ORDER — POLYETHYLENE GLYCOL 3350 17 G PO PACK
17.0000 g | PACK | Freq: Two times a day (BID) | ORAL | Status: DC | PRN
  Administered 2020-02-26 – 2020-02-27 (×2): 17 g via ORAL
  Filled 2020-02-26 (×2): qty 1

## 2020-02-26 MED ORDER — INSULIN ASPART 100 UNIT/ML ~~LOC~~ SOLN: 0.0000 [IU] | Freq: Every day | SUBCUTANEOUS | Status: DC

## 2020-02-26 NOTE — Progress Notes (Signed)
PROGRESS NOTE  Shannon Lucas WNI:[id number] DOB: September 22, 1931   PCP: Manfred Shirts, PA  Patient is from: Home  DOA: 02/22/2020 LOS: 4  Brief Narrative / Interim history: 86 year old female with history of diabetes mellitus type 2, paroxysmal A. fib with tacky/bradycardia syndrome, chronic kidney disease stage IV, chronic systolic CHF, recent UTI was found down at home on 02/22/2020.  Patient apparently felt weak and tripped and fell.  In the ED she was found to have altered mental status with question of pneumonia/UTI and was found to be febrile at 103F.    She was treated with antibiotics for pneumonia and UTI empirically.  Started on IV Lasix by cardiology for acute on chronic systolic CHF likely after IV fluid resuscitation on admission.  Subjective: Seen and examined earlier this morning.  No major events overnight of this morning.  No complaint this morning.  She denies chest pain, dyspnea, GI or UTI symptoms.  Objective: Vitals:   02/25/20 2118 02/26/20 0317 02/26/20 0323 02/26/20 0751  BP: 116/61  (!) 112/52 130/70  Pulse: 73  89 90  Resp:   20 18  Temp: (!) 97.5 F (36.4 C)  97.6 F (36.4 C) 97.6 F (36.4 C)  TempSrc: Oral  Oral Oral  SpO2: 95%  99% 95%  Weight:  100.6 kg    Height:        Intake/Output Summary (Last 24 hours) at 02/26/2020 1338 Last data filed at 02/26/2020 0915 Gross per 24 hour  Intake 240 ml  Output 1850 ml  Net -1610 ml   Filed Weights   02/24/20 0600 02/25/20 0434 02/26/20 0317  Weight: 100.9 kg 101.2 kg 100.6 kg    Examination:  GENERAL: No apparent distress. Nontoxic.  HEENT: MMM.  Vision and hearing grossly intact.  NECK: Supple.  No apparent JVD.  RESP:  No IWOB. Good air movement bilaterally. CVS:  RRR. Heart sounds normal.  ABD/GI/GU: BS present. Soft. Non tender.  MSK/EXT:  Moves extremities. No apparent deformity. No edema.  SKIN: no apparent skin lesion or wound NEURO: Awake, alert and oriented appropriately.  No apparent focal  neuro deficit. PSYCH: Calm. Normal affect.   Procedures:  None  Microbiology summarized: 4/10-RVP negative. 4/10-COVID-19 PCR negative. 4/10-urine culture with insignificant growth. 4/10-blood cultures NGTD.  Assessment & Plan: Acute respiratory failure with hypoxia-multifactorial including probable pneumonia and CHF exacerbation.  Resolved. -Treat treatable causes as below.  Sepsis due to presumed UTI/community-acquired pneumonia: Present on admission -Completing 5 days course of ceftriaxone and azithromycin today (4/14).  Acute on chronic systolic CHF: Echo in 01/8181 with EF of 60 to 65% (35 to 40% in 2018).  On IV Lasix by cardiology.  About 1.6 L UOP/24 hours but net +2.0 L since admit.  Creatinine relatively stable. -Cardiology managing-transition to p.o. Lasix -GDMT-continue metoprolol XL 100 mg daily, hydralazine 100 mg 3 times daily and Imdur 30 mg daily -No ACE/ARB/ARNI due to CKD-4. -Monitor fluid status, renal function and electrolytes.  Acute metabolic encephalopathy-resolved.  Oriented x4 today. -Frequent reorientation and delirium precautions -Treat treatable causes.  Chronic CKD stage IV/azotemia: Relatively stable. -Continue monitoring. -Avoid nephrotoxic meds  Fall and rib fractures -Continue incentive spirometry and pain management.   Elevated LFTs/ALT: thought to be due to sepsis.  She is also on amiodarone which could potentially contribute.  RUQ [place] with possible cirrhosis.  Improving. -Continue trending -Continue holding started  Paroxysmal A. fib with tachybradycardia syndrome s/p dual-chamber PPM -Continue metoprolol and amiodarone and Xarelto  Uncontrolled DM-2 with  hyperglycemia: On Lantus at home. Recent Labs    02/25/20 1603 02/26/20 0612 02/26/20 1128  GLUCAP 199* 167* 197*  -Check hemoglobin A1c -Start SSI   Thrombocytopenia: Could be due to sepsis and possible cirrhosis.  Improving. -Continue monitoring  Hypertension:  Normotensive. -Cardiac meds as above.  Dyslipidemia -Hold Pravachol due to LFTs  Debility/recent fall/physical deconditioning -PT/OT-recommended SNF-family undecided  Anxiety/depression: Stable. -Continue home medications  Goal of care: Patient with comorbidities as above.  Remains full code -Palliative care following.                  DVT prophylaxis: On Xarelto for A. fib Code Status: Full  code Family Communication: Patient and/or RN. Available if any question.   Discharge barrier: Patient is medically stable for discharge to SNF once bed available Patient is from: Home Final disposition: SNF in the next 24 to 48 hours.  Consultants:  Cardiology Palliative care   Sch Meds:  Scheduled Meds: . acetaminophen  650 mg Oral TID  . amiodarone  200 mg Oral Daily  . furosemide  20 mg Oral Daily  . hydrALAZINE  100 mg Oral Q8H  . insulin glargine  15 Units Subcutaneous Daily  . isosorbide mononitrate  30 mg Oral Daily  . levothyroxine  50 mcg Oral Daily  . lidocaine  1 patch Transdermal Q24H  . metoprolol succinate  100 mg Oral Daily  . Rivaroxaban  15 mg Oral Q supper  . senna-docusate  1 tablet Oral BID  . venlafaxine  75 mg Oral Q breakfast   Continuous Infusions: . azithromycin 500 mg (02/25/20 1802)  . cefTRIAXone (ROCEPHIN)  IV 2 g (02/25/20 1725)   PRN Meds:.benzonatate, diclofenac Sodium, ipratropium, oxyCODONE, polyethylene glycol, senna-docusate  Antimicrobials: Anti-infectives (From admission, onward)   Start     Dose/Rate Route Frequency Ordered Stop   02/22/20 1800  cefTRIAXone (ROCEPHIN) 2 g in sodium chloride 0.9 % 100 mL IVPB     2 g 200 mL/hr over 30 Minutes Intravenous Every 24 hours 02/22/20 1749 02/27/20 1759   02/22/20 1800  azithromycin (ZITHROMAX) 500 mg in sodium chloride 0.9 % 250 mL IVPB     500 mg 250 mL/hr over 60 Minutes Intravenous Every 24 hours 02/22/20 1749 02/27/20 1759       I have personally reviewed the following  labs and images: CBC: Recent Labs  Lab 02/23/20 0047 02/23/20 0754 02/24/20 0311 02/25/20 0245 02/26/20 0324  WBC 9.1 8.6 5.0 5.9 8.5  NEUTROABS 8.7* 7.9* 4.4 4.6 6.7  HGB 12.1 11.8* 10.9* 11.6* 12.9  HCT 38.2 37.3 33.9* 35.4* 38.7  MCV 102.1* 100.8* 100.3* 96.2 96.3  PLT 125* 102* 92* 107* 142*   BMP &GFR Recent Labs  Lab 02/22/20 1714 02/23/20 0754 02/24/20 0311 02/25/20 0245 02/26/20 0324  NA 135 137 137 133* 135  K 4.7 4.4 4.2 4.0 3.9  CL 105 106 105 102 101  CO2 20* 20* 20* 20* 23  GLUCOSE 265* 189* 88 135* 180*  BUN 30* 32* 39* 40* 39*  CREATININE 2.13* 2.01* 2.13* 2.04* 2.21*  CALCIUM 9.0 8.4* 8.1* 8.1* 8.7*  MG  --  1.7 1.8 1.8 1.8   Estimated Creatinine Clearance: 21.5 mL/min (A) (by C-G formula based on SCr of 2.21 mg/dL (H)). Liver & Pancreas: Recent Labs  Lab 02/22/20 1714 02/23/20 0754 02/24/20 0311 02/25/20 0757 02/26/20 0324  AST 57* 233* 153* 79* 44*  ALT 37 161* 147* 108* 84*  ALKPHOS 109 132* 121 137* 153*  BILITOT 0.8 1.1 0.8 0.9 0.8  PROT 6.4* 5.8* 5.3* 5.3* 5.9*  ALBUMIN 3.5 3.0* 2.7* 2.6* 2.8*   No results for input(s): LIPASE, AMYLASE in the last 168 hours. Recent Labs  Lab 02/24/20 0311  AMMONIA 35   Diabetic: No results for input(s): HGBA1C in the last 72 hours. Recent Labs  Lab 02/25/20 0626 02/25/20 1132 02/25/20 1603 02/26/20 0612 02/26/20 1128  GLUCAP 127* 183* 199* 167* 197*   Cardiac Enzymes: No results for input(s): CKTOTAL, CKMB, CKMBINDEX, TROPONINI in the last 168 hours. No results for input(s): PROBNP in the last 8760 hours. Coagulation Profile: Recent Labs  Lab 02/22/20 1714  INR 1.8*   Thyroid Function Tests: Recent Labs    02/24/20 0311  TSH 4.816*   Lipid Profile: No results for input(s): CHOL, HDL, LDLCALC, TRIG, CHOLHDL, LDLDIRECT in the last 72 hours. Anemia Panel: Recent Labs    02/24/20 0311  VITAMINB12 467  FOLATE 8.7   Urine analysis:    Component Value Date/Time   COLORURINE  AMBER (A) 02/22/2020 1833   APPEARANCEUR HAZY (A) 02/22/2020 1833   LABSPEC 1.020 02/22/2020 1833   PHURINE 5.0 02/22/2020 1833   GLUCOSEU 50 (A) 02/22/2020 1833   HGBUR MODERATE (A) 02/22/2020 1833   BILIRUBINUR NEGATIVE 02/22/2020 1833   [place] 02/22/2020 1833   PROTEINUR 100 (A) 02/22/2020 1833   NITRITE POSITIVE (A) 02/22/2020 1833   LEUKOCYTESUR LARGE (A) 02/22/2020 1833   Sepsis Labs: Invalid input(s): PROCALCITONIN, [place]  Microbiology: Recent Results (from the past 240 hour(s))  Blood Culture (routine x 2)     Status: None (Preliminary result)   Collection Time: 02/22/20  4:37 PM   Specimen: BLOOD  Result Value Ref Range Status   Specimen Description BLOOD BLOOD LEFT WRIST  Final   Special Requests   Final    BOTTLES DRAWN AEROBIC AND ANAEROBIC Blood Culture adequate volume   Culture NO GROWTH 4 DAYS  Final   Report Status PENDING  Incomplete  Urine culture     Status: Abnormal   Collection Time: 02/22/20  6:33 PM   Specimen: In/Out Cath Urine  Result Value Ref Range Status   Specimen Description IN/OUT CATH URINE  Final   Special Requests NONE  Final   Culture (A)  Final    <10,000 COLONIES/mL INSIGNIFICANT GROWTH Performed at [facility], [address] [place], [place] [address]    Report Status 02/23/2020 FINAL  Final  SARS CORONAVIRUS 2 (TAT 6-24 HRS) Nasopharyngeal Nasopharyngeal Swab     Status: None   Collection Time: 02/22/20 10:48 PM   Specimen: Nasopharyngeal Swab  Result Value Ref Range Status   SARS Coronavirus 2 NEGATIVE NEGATIVE Final    Comment: (NOTE) SARS-CoV-2 target nucleic acids are NOT DETECTED. The SARS-CoV-2 RNA is generally detectable in upper and lower respiratory specimens during the acute phase of infection. Negative results do not preclude SARS-CoV-2 infection, do not rule out co-infections with other pathogens, and should not be used as the sole basis for treatment or other patient management  decisions. Negative results must be combined with clinical observations, patient history, and epidemiological information. The expected result is Negative. Fact Sheet for Patients: SugarRoll.be Fact Sheet for Healthcare Providers: https://www.woods-mathews.com/ This test is not yet approved or cleared by the [place] FDA and  has been authorized for detection and/or diagnosis of SARS-CoV-2 by FDA under an Emergency Use Authorization (EUA). This EUA will remain  in effect (meaning this test can be used) for  the duration of the COVID-19 declaration under Section 56 4(b)(1) of the Act, 21 U.S.C. section 360bbb-3(b)(1), unless the authorization is terminated or revoked sooner. Performed at [facility], [place] [address] [place], [place] [address]   Respiratory Panel by PCR     Status: None   Collection Time: 02/22/20 11:30 PM   Specimen: Nasopharyngeal Swab; Respiratory  Result Value Ref Range Status   Adenovirus NOT DETECTED NOT DETECTED Final   Coronavirus 229E NOT DETECTED NOT DETECTED Final    Comment: (NOTE) The Coronavirus on the Respiratory Panel, DOES NOT test for the novel  Coronavirus (2019 nCoV)    Coronavirus HKU1 NOT DETECTED NOT DETECTED Final   Coronavirus NL63 NOT DETECTED NOT DETECTED Final   Coronavirus OC43 NOT DETECTED NOT DETECTED Final   Metapneumovirus NOT DETECTED NOT DETECTED Final   Rhinovirus / Enterovirus NOT DETECTED NOT DETECTED Final   Influenza A NOT DETECTED NOT DETECTED Final   Influenza B NOT DETECTED NOT DETECTED Final   Parainfluenza Virus 1 NOT DETECTED NOT DETECTED Final   Parainfluenza Virus 2 NOT DETECTED NOT DETECTED Final   Parainfluenza Virus 3 NOT DETECTED NOT DETECTED Final   Parainfluenza Virus 4 NOT DETECTED NOT DETECTED Final   Respiratory Syncytial Virus NOT DETECTED NOT DETECTED Final   Bordetella pertussis NOT DETECTED NOT DETECTED Final   Chlamydophila pneumoniae NOT DETECTED  NOT DETECTED Final   Mycoplasma pneumoniae NOT DETECTED NOT DETECTED Final    Comment: Performed at [facility], [place]. [address] [place], [place] [address]  Blood Culture (routine x 2)     Status: None (Preliminary result)   Collection Time: 02/23/20 12:47 AM   Specimen: BLOOD  Result Value Ref Range Status   Specimen Description BLOOD LEFT ARM  Final   Special Requests   Final    BOTTLES DRAWN AEROBIC AND ANAEROBIC Blood Culture adequate volume Performed at [facility], [place] [address] [place], [place] [address]    Culture NO GROWTH 3 DAYS  Final   Report Status PENDING  Incomplete    Radiology Studies: No results found.    Kynlea Blackston T. Dixie  If 7PM-7AM, please contact night-coverage [email or web address] Password [facility] 02/26/2020, 1:38 PM

## 2020-02-26 NOTE — TOC Progression Note (Signed)
Transition of Care [facility]) - Progression Note    Patient Details  Name: Shannon Lucas MRN: [id number] Date of Birth: 1931/04/26  Transition of Care [facility]) CM/SW [place], [place] Phone Number: 02/26/2020, 4:20 PM  Clinical Narrative:     CSW spoke with patient's daughter,Gina. CSW introduced self and explained role. CSW discussed PT recommendation of ST rehab at [facility]. Patient's daughter states, patient lives in a in-law suite connected to the home. She believes it is best for the patient to remain in her familiar environment. They have monitoring system and life alert bracelet for patient. Family declined SNF, prefer the patient discharge home with [place]. Patient's daughter states no HH preference , requested hospital bed and 3N1. Address [address] [place], [place] [address].   RNCM -updated.   Thurmond Butts, MSW, [place] Clinical Social Worker      Expected Discharge Plan: Skilled Nursing Facility Barriers to Discharge: Continued Medical Work up  Expected Discharge Plan and Services Expected Discharge Plan: [place] In-house Referral: Clinical Social Work Discharge Planning Services: CM Consult Post Acute Care Choice: Durable Medical Equipment, Home Health Living arrangements for the past 2 months: [place]                 DME Arranged: 3-N-1, Hospital bed DME Agency: [employer]       HH Arranged: Refused SNF [place] Agency: [place] ([place]) Date [place]: 02/26/20 Time [place]: 1612 Representative spoke with at [place]: [place] ([place]) Interventions    Readmission Risk Interventions No flowsheet data found.

## 2020-02-26 NOTE — Progress Notes (Signed)
Cardiology Progress Note  Patient ID: Shannon Lucas MRN: [id number] DOB: 1931-03-17 Date of Encounter: 02/26/2020  Primary Cardiologist: Loralie Champagne, MD  Subjective  Net negative 1 L. In and out of Afib. No SOB. Still with rib pain but improving.   ROS:  All other ROS reviewed and negative. Pertinent positives noted in the HPI.     Inpatient Medications  Scheduled Meds: . acetaminophen  650 mg Oral TID  . amiodarone  200 mg Oral Daily  . hydrALAZINE  100 mg Oral Q8H  . insulin glargine  15 Units Subcutaneous Daily  . isosorbide mononitrate  30 mg Oral Daily  . levothyroxine  50 mcg Oral Daily  . lidocaine  1 patch Transdermal Q24H  . metoprolol succinate  100 mg Oral Daily  . Rivaroxaban  15 mg Oral Q supper  . senna-docusate  1 tablet Oral BID  . venlafaxine  75 mg Oral Q breakfast   Continuous Infusions: . azithromycin 500 mg (02/25/20 1802)  . cefTRIAXone (ROCEPHIN)  IV 2 g (02/25/20 1725)   PRN Meds: benzonatate, diclofenac Sodium, ipratropium, oxyCODONE, polyethylene glycol   Vital Signs   Vitals:   02/25/20 2118 02/26/20 0317 02/26/20 0323 02/26/20 0751  BP: 116/61  (!) 112/52 130/70  Pulse: 73  89 90  Resp:   20 18  Temp: (!) 97.5 F (36.4 C)  97.6 F (36.4 C) 97.6 F (36.4 C)  TempSrc: Oral  Oral Oral  SpO2: 95%  99% 95%  Weight:  100.6 kg    Height:        Intake/Output Summary (Last 24 hours) at 02/26/2020 0927 Last data filed at 02/26/2020 0915 Gross per 24 hour  Intake 360 ml  Output 1850 ml  Net -1490 ml   Last 3 Weights 02/26/2020 02/25/2020 02/24/2020  Weight (lbs) 221 lb 12.5 oz 223 lb 3.2 oz 222 lb 7.1 oz  Weight (kg) 100.6 kg 101.243 kg 100.9 kg      Telemetry  Overnight telemetry shows NSR with intermittent Afib which I personally reviewed.   Physical Exam   Vitals:   02/25/20 2118 02/26/20 0317 02/26/20 0323 02/26/20 0751  BP: 116/61  (!) 112/52 130/70  Pulse: 73  89 90  Resp:   20 18  Temp: (!) 97.5 F (36.4 C)  97.6 F (36.4  C) 97.6 F (36.4 C)  TempSrc: Oral  Oral Oral  SpO2: 95%  99% 95%  Weight:  100.6 kg    Height:         Intake/Output Summary (Last 24 hours) at 02/26/2020 0927 Last data filed at 02/26/2020 0915 Gross per 24 hour  Intake 360 ml  Output 1850 ml  Net -1490 ml    Last 3 Weights 02/26/2020 02/25/2020 02/24/2020  Weight (lbs) 221 lb 12.5 oz 223 lb 3.2 oz 222 lb 7.1 oz  Weight (kg) 100.6 kg 101.243 kg 100.9 kg    Body mass index is 36.91 kg/m.   General: Well nourished, well developed, in no acute distress Head: Atraumatic, normal size  Eyes: PEERLA, EOMI  Neck: Supple, no JVD Endocrine: No thryomegaly Cardiac: Normal S1, S2; RRR; no murmurs, rubs, or gallops Lungs: Clear to auscultation bilaterally, no wheezing, rhonchi or rales  Abd: Soft, nontender, no hepatomegaly  Ext: trace edema  Musculoskeletal: No deformities, BUE and BLE strength normal and equal Skin: Warm and dry, no rashes   Neuro: Alert and oriented to person, place, time, and situation, CNII-XII grossly intact, no focal deficits  Psych: Normal mood  and affect   Labs  High Sensitivity Troponin:   Recent Labs  Lab 02/22/20 2230 02/23/20 0047 02/24/20 1621  TROPONINIHS 96* 100* 40*     Cardiac EnzymesNo results for input(s): TROPONINI in the last 168 hours. No results for input(s): TROPIPOC in the last 168 hours.  Chemistry Recent Labs  Lab 02/24/20 0311 02/25/20 0245 02/25/20 0757 02/26/20 0324  NA 137 133*  --  135  K 4.2 4.0  --  3.9  CL 105 102  --  101  CO2 20* 20*  --  23  GLUCOSE 88 135*  --  180*  BUN 39* 40*  --  39*  CREATININE 2.13* 2.04*  --  2.21*  CALCIUM 8.1* 8.1*  --  8.7*  PROT 5.3*  --  5.3* 5.9*  ALBUMIN 2.7*  --  2.6* 2.8*  AST 153*  --  79* 44*  ALT 147*  --  108* 84*  ALKPHOS 121  --  137* 153*  BILITOT 0.8  --  0.9 0.8  GFRNONAA 20* 22*  --  20*  GFRAA 24* 25*  --  23*  ANIONGAP 12 11  --  11    Hematology Recent Labs  Lab 02/24/20 0311 02/25/20 0245 02/26/20 0324    WBC 5.0 5.9 8.5  RBC 3.38* 3.68* 4.02  HGB 10.9* 11.6* 12.9  HCT 33.9* 35.4* 38.7  MCV 100.3* 96.2 96.3  MCH 32.2 31.5 32.1  MCHC 32.2 32.8 33.3  RDW 14.2 14.1 14.2  PLT 92* 107* 142*   BNP Recent Labs  Lab 02/22/20 1714  BNP 1,098.3*    DDimer No results for input(s): DDIMER in the last 168 hours.   Radiology  No results found.  Cardiac Studies  TTE 12/18/2018 1. The left ventricle has normal systolic function of 56-21%. The cavity  size is normal. There is mildly increased left ventricular wall thickness.  Indeterminant diastolic function. Normal wall motion.  2. The right ventricle has normal systolic function. The cavity in normal  in size. There is no increase in right ventricular wall thickness.  3. Mildly dilated left atrial size.  4. The aortic valve is tricuspid. There is mild calcification of the  aortic valve. No aortic stenosis.  5. The aortic root and ascending aorta are normal in size and structure.  6. The inferior vena cava was dilated in size with >50% respiratory  variability.  7. The interatrial septum was not well visualized.  8. The inferior vena cava was dilated in size with greater than 50%  respiratory variability.  9. No evidence of left ventricular regional wall motion abnormalities.  10. No significant mitral regurgitation.  11. No complete TR doppler jet so unable to estimate PA systolic pressure.  12. The mitral valve is normal in structure There is mild to moderate  mitral annular calcification present.  13. The pulmonic valve is grossly normal. Pulmonic valve regurgitation was  not assessed by color flow Doppler.   Patient Profile  Shannon Lucas is an 85 yo F with history of CKD IV, paroxysmal Afib, SSS s/p ppm, systolic HF with recovery of EF 60-65% who was admitted with fall, rib fractures, sepsis 2/2 UTI. Cardiology consulted for heart failure management.   Assessment & Plan   1. Acute on Chronic Systolic HF (recovery of EF),  60-65% -volume status acceptable; resume 20 mg PO lasix daily  -no Ace/ARB/ARNI due to CKD IV -continue metoprolol succinate 100 mg QD -continue hydralazine 100 mg TID and imdur 30  daily   2. Rib Fracture -consider nerve block as she is in a lot of pain  3. Atrial fibrillation, paroxysmal -in and out of Afib. Rate controlled. No issues. Ppm back up in place  -continue xarelto -went in Afib due to volume up and better with diuresis  [facility] will sign off.   Medication Recommendations:  Metoprolol succinate 100 mg QD, hydralazine 100 mg TID, imdur 30 mg QD, lasix 20 mg QD, xarelto 15 mg QHS, amiodarone 200 mg QD, no ACE/ARB/ARNI/aldactone due to CKD  Other recommendations (labs, testing, etc):  none Follow up as an outpatient:  We will arrange outpatient follow-up in 4-6 weeks   For questions or updates, please contact [facility] Please consult [email or web address] for contact info under   Time Spent with Patient: I have spent a total of 25 minutes with patient reviewing hospital notes, telemetry, EKGs, labs and examining the patient as well as establishing an assessment and plan that was discussed with the patient.  > 50% of time was spent in direct patient care.    Signed, Addison Naegeli. Audie Box, [place]  02/26/2020 9:27 AM

## 2020-02-26 NOTE — TOC Initial Note (Signed)
Transition of Care (TOC) - Initial/Assessment Note  Marvetta Gibbons RN, BSN Transitions of Care Unit 4E- RN Case Manager [phone]   Patient Details  Name: Shannon Lucas MRN: [id number] Date of Birth: 07/07/1931  Transition of Care [facility]) CM/SW Contact:    Dawayne Patricia, RN Phone Number: 02/26/2020, 4:12 PM  Clinical Narrative:                 Notified by CSW- Caren Griffins that daughter was declining SNF placement and prefers to take patient home with [facility]. Call made to daughter Ginger to discuss transition needs and offer choice for [facility] agency. Per TC with daughter she confirmed choice to take pt home HH- declines SNF placement- choice offered for [facility] agency- daughter states she does not have a preference- offered preferred [facility] and daughter agreeable to referral to [facility]. - address for discharge confirmed with daughter as: Pt will need HH orders placed prior to discharge- MD aware.   [address] [place] [address] Phone # primary- [place] Deon Pilling- daughter- [phone]  Also discussed with daughter DME needs, per daughter request for hospital bed and lift for home, will also need [facility]- MD to place orders for DME, pt has RW at home.  Daughter plans to transport home via private vehicle.  Call made to [facility] with [facility] for [facility] referral- pending orders- referral accepted.  Will made referral to [facility] for DME once orders have been placed.    Expected Discharge Plan: Skilled Nursing Facility Barriers to Discharge: Continued Medical Work up   Patient Goals and CMS Choice Patient states their goals for this hospitalization and ongoing recovery are:: return home CMS Medicare.gov Compare Post Acute Care list provided to:: Patient Represenative (must comment)(daughter) Choice offered to / list presented to : Adult Children  Expected Discharge Plan and Services Expected Discharge Plan: [place] In-house Referral: Clinical Social Work Discharge Planning Services: CM  Consult Post Acute Care Choice: Durable Medical Equipment, Home Health Living arrangements for the past 2 months: [place]                 DME Arranged: 3-N-1, Hospital bed DME Agency: [facility]       HH Arranged: Refused SNF [place] Agency: [place] ([facility]) Date [place]: 02/26/20 Time [place]: 1612 Representative spoke with at [place]: Butch Penny  Prior Living Arrangements/Services Living arrangements for the past 2 months: [place] Lives with:: Adult Children Patient language and need for interpreter reviewed:: Yes Do you feel safe going back to the place where you live?: Yes      Need for Family Participation in Patient Care: Yes (Comment) Care giver support system in place?: Yes (comment)   Criminal Activity/Legal Involvement Pertinent to Current Situation/Hospitalization: No - Comment as needed  Activities of Daily Living      Permission Sought/Granted Permission sought to share information with : Investment banker, corporate granted to share info w AGENCY: HH and DME agencies        Emotional Assessment Appearance:: Appears stated age       Alcohol / Substance Use: Not Applicable Psych Involvement: No (comment)  Admission diagnosis:  Pneumonia [J18.9] Sepsis ([place]) [A41.9] Community acquired pneumonia, unspecified laterality [J18.9] Patient Active Problem List   Diagnosis Date Noted  . Palliative care by specialist   . Goals of care, counseling/discussion   . Sepsis ([place]) 02/22/2020  . Thrombocytopenia ([place]) 02/22/2020  .  Pneumonia 02/22/2020  . 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 Pacemaker 06/25/2018  . Tachy-brady syndrome ([place]) 11/20/2017  . Vitamin D deficiency 10/14/2017  . Chronic systolic heart failure ([place]) 03/15/2017  . Pseudoaneurysm following procedure ([place])   . Acute  systolic congestive heart failure ([place])   . AKI (acute kidney injury) ([place])   . CHF exacerbation ([place]) 03/01/2017  . Fall   . Anemia   . CKD (chronic kidney disease) stage 3, GFR 30-59 ml/min   . Diabetes mellitus type II, controlled ([place])   . Paroxysmal atrial fibrillation (HCC)   . Chronic anticoagulation   . Pure hypercholesterolemia   . Shortness of breath 02/28/2017  . Persistent cough for 3 weeks or longer 10/25/2016  . Murmur 07/28/2016  . Severe nonproliferative diabetic retinopathy of both eyes without macular edema associated with type 2 diabetes mellitus ([place]) 07/13/2016  . Hyperparathyroidism due to renal insufficiency ([place]) 02/19/2016  . Microalbuminuria due to type 2 diabetes mellitus ([place]) 02/19/2016  . GERD (gastroesophageal reflux disease) 11/13/2015  . Risk for falls 11/13/2015  . TIA (transient ischemic attack) 04/15/2015  . Essential hypertension 08/05/2008   PCP:  Manfred Shirts, PA Pharmacy:   [facility], [place] [address] Phone: [phone] Fax: [phone]     Social Determinants of Health (SDOH) Interventions    Readmission Risk Interventions No flowsheet data found.

## 2020-02-26 NOTE — Progress Notes (Signed)
  Speech Language Pathology Treatment: Dysphagia  Patient Details Name: Shannon Lucas MRN: [id number] DOB: [birth year] Today's Date: 02/26/2020 Time: 4098-1191 SLP Time Calculation (min) (ACUTE ONLY): 10 min  Assessment / Plan / Recommendation Clinical Impression  Pt was seen for dysphagia treatment and was cooperative throughout the session. She reported that she feels as though she has suddenly improved compared to the last few days. Pt and nursing reported that the pt has been tolerating the current diet without overt s/sx of aspiration. Pt consumed mixed consistency boluses (dysphagia 3 with thin liquids), regular texture solids, and thin liquids via straw using consecutive swallows without symptoms of oropharyngeal dysphagia. It is recommended that the current diet be continued and further skilled SLP services are not clinically indicated at this time.    HPI HPI: Pt is an 86 y.o. female with medical history significant of DM, CKD, a. Fib, CHF and recent UTI who presented with AMS after fall and was found to have broken ribs, UTI, pneumonia. CT of the head negative. CXR 4/10: Bilateral interstitial opacities may represent edema or atypical infection. Opacity more patchy with a few more focal regions of opacity on the right. asymmetric edema vs developing pneumonia. On 4/14 WBC WNL and pt afebrile      SLP Plan  All goals met;Discharge SLP treatment due to (comment)       Recommendations  Diet recommendations: Regular;Thin liquid Liquids provided via: Cup;Straw Medication Administration: Whole meds with liquid Supervision: Patient able to self feed Compensations: Minimize environmental distractions Postural Changes and/or Swallow Maneuvers: Seated upright 90 degrees                Oral Care Recommendations: Oral care BID Follow up Recommendations: None SLP Visit Diagnosis: Dysphagia, unspecified (R13.10) Plan: All goals met;Discharge SLP treatment due to (comment)        Winfrey Chillemi I. Hardin Negus, [place], [place] Office number [phone] Pager [place] 02/26/2020, 11:51 AM

## 2020-02-27 DIAGNOSIS — I5023 Acute on chronic systolic (congestive) heart failure: Secondary | ICD-10-CM | POA: Diagnosis not present

## 2020-02-27 DIAGNOSIS — N186 End stage renal disease: Secondary | ICD-10-CM

## 2020-02-27 DIAGNOSIS — Z794 Long term (current) use of insulin: Secondary | ICD-10-CM

## 2020-02-27 DIAGNOSIS — E1122 Type 2 diabetes mellitus with diabetic chronic kidney disease: Secondary | ICD-10-CM

## 2020-02-27 DIAGNOSIS — Z7901 Long term (current) use of anticoagulants: Secondary | ICD-10-CM | POA: Diagnosis not present

## 2020-02-27 DIAGNOSIS — I10 Essential (primary) hypertension: Secondary | ICD-10-CM

## 2020-02-27 DIAGNOSIS — J189 Pneumonia, unspecified organism: Secondary | ICD-10-CM | POA: Diagnosis not present

## 2020-02-27 DIAGNOSIS — Z992 Dependence on renal dialysis: Secondary | ICD-10-CM

## 2020-02-27 LAB — CULTURE, BLOOD (ROUTINE X 2)
Culture: NO GROWTH
Special Requests: ADEQUATE

## 2020-02-27 LAB — COMPREHENSIVE METABOLIC PANEL
ALT: 57 U/L — ABNORMAL HIGH (ref 0–44)
AST: 26 U/L (ref 15–41)
Albumin: 2.7 g/dL — ABNORMAL LOW (ref 3.5–5.0)
Alkaline Phosphatase: 149 U/L — ABNORMAL HIGH (ref 38–126)
Anion gap: 10 (ref 5–15)
BUN: 34 mg/dL — ABNORMAL HIGH (ref 8–23)
CO2: 23 mmol/L (ref 22–32)
Calcium: 9.1 mg/dL (ref 8.9–10.3)
Chloride: 104 mmol/L (ref 98–111)
Creatinine, Ser: 2.01 mg/dL — ABNORMAL HIGH (ref 0.44–1.00)
GFR calc Af Amer: 25 mL/min — ABNORMAL LOW (ref >60)
GFR calc non Af Amer: 22 mL/min — ABNORMAL LOW (ref >60)
Glucose, Bld: 152 mg/dL — ABNORMAL HIGH (ref 70–99)
Potassium: 4.2 mmol/L (ref 3.5–5.1)
Sodium: 137 mmol/L (ref 135–145)
Total Bilirubin: 0.8 mg/dL (ref 0.3–1.2)
Total Protein: 5.3 g/dL — ABNORMAL LOW (ref 6.5–8.1)

## 2020-02-27 LAB — GLUCOSE, CAPILLARY
Glucose-Capillary: 150 mg/dL — ABNORMAL HIGH (ref 70–99)
Glucose-Capillary: 220 mg/dL — ABNORMAL HIGH (ref 70–99)
Glucose-Capillary: 202 mg/dL — ABNORMAL HIGH (ref 70–99)

## 2020-02-27 MED ORDER — LANTUS SOLOSTAR 100 UNIT/ML ~~LOC~~ SOPN: 20.0000 [IU] | PEN_INJECTOR | Freq: Every day | SUBCUTANEOUS | 3 refills | Status: DC

## 2020-02-27 MED ORDER — FERROUS SULFATE 325 (65 FE) MG PO TABS: 325.0000 mg | ORAL_TABLET | Freq: Two times a day (BID) | ORAL | 6 refills | Status: DC

## 2020-02-27 MED ORDER — FUROSEMIDE 20 MG PO TABS: 20.0000 mg | ORAL_TABLET | Freq: Every day | ORAL | 1 refills | Status: DC

## 2020-02-27 MED ORDER — SENNOSIDES-DOCUSATE SODIUM 8.6-50 MG PO TABS: 1.0000 | ORAL_TABLET | Freq: Two times a day (BID) | ORAL | Status: DC | PRN

## 2020-02-27 MED ORDER — HYDRALAZINE HCL 100 MG PO TABS: 100.0000 mg | ORAL_TABLET | Freq: Three times a day (TID) | ORAL | 1 refills | Status: DC

## 2020-02-27 MED ORDER — POLYETHYLENE GLYCOL 3350 17 GM/SCOOP PO POWD: 17.0000 g | Freq: Two times a day (BID) | ORAL | 0 refills | Status: DC | PRN

## 2020-02-27 MED ORDER — PRAVASTATIN SODIUM 40 MG PO TABS: 40.0000 mg | ORAL_TABLET | Freq: Every evening | ORAL | 1 refills | Status: DC

## 2020-02-27 MED FILL — hydrALAZINE HCL 100 MG TABS: 100 | 90 days supply | Qty: 270 | Fill #0

## 2020-02-27 MED FILL — PRAVASTATIN NA 40 MG TAB: 40 | 90 days supply | Qty: 90 | Fill #0

## 2020-02-27 MED FILL — FUROSEMIDE 20 MG TAB: 20 | 90 days supply | Qty: 90 | Fill #0

## 2020-02-27 NOTE — Progress Notes (Signed)
Discharge instructions given to patient. Iv removed clean and intact. Medications reviewed. All questions answered. Pt escorted home by daughter.  Arletta Bale, RN

## 2020-02-27 NOTE — TOC Transition Note (Signed)
Transition of Care [facility]) - CM/SW Discharge Note Marvetta Gibbons RN, BSN Transitions of Care Unit 4E- RN Case Manager [phone]   Patient Details  Name: Shannon Lucas MRN: [id number] Date of Birth: November 18, 1930  Transition of Care [facility]) CM/SW Contact:  Dawayne Patricia, RN Phone Number: 02/27/2020, 4:05 PM   Clinical Narrative:    Pt stable for transition home today, HH and DME has been arranged, spoke with daughter Ginger who states DME to be delivered in the am- daughter has recliner that she plans to use for mom to sleep in tonight and feels comfortable with bringing mom home tonight with planned delivery of DME tomorrow. No other needed noted in speaking with daughter. Daughter to transport home.  Butch Penny with [facility] notified of transition home for [name] start of care   Final next level of care: [place] Barriers to Discharge: Barriers Resolved   Patient Goals and CMS Choice Patient states their goals for this hospitalization and ongoing recovery are:: return home CMS Medicare.gov Compare Post Acute Care list provided to:: Patient Represenative (must comment)(daughter) Choice offered to / list presented to : Adult Children  Discharge Placement                 Home with [facility]      Discharge Plan and Services In-house Referral: Clinical Social Work Discharge Planning Services: CM Consult Post Acute Care Choice: Durable Medical Equipment, Home Health          DME Arranged: Hospital bed DME Agency: [facility] Date DME Agency Contacted: 02/27/20 Time DME Agency Contacted: 66 Representative spoke with at DME Agency: [place]: RN, PT, OT, Nurse's Aide [place] Agency: [place] ([place]) Date [place]: 02/26/20 Time [place]: 1612 Representative spoke with at [place]: [place] ([place]) Interventions     Readmission Risk Interventions Readmission Risk Prevention Plan 02/27/2020   Transportation Screening Complete  PCP or Specialist Appt within 5-7 Days Complete  Home Care Screening Complete  Medication Review (RN CM) Complete  Some recent data might be hidden

## 2020-02-27 NOTE — Progress Notes (Signed)
Physical Therapy Treatment Patient Details Name: Shannon Lucas MRN: [id number] DOB: 04/07/1931 Today's Date: 02/27/2020    History of Present Illness 86 year old female with history of diabetes mellitus type 2, paroxysmal A. fib with tacky/bradycardia syndrome, chronic kidney disease stage IV, chronic systolic CHF, recent UTI was found down at home on 02/22/2020.  Patient apparently felt weak and tripped and fell.  In the ED she was found to have altered mental status with question of pneumonia/UTI and was found to be febrile at 103F.    PT Comments    Pt making good progress. Pt to go home with daughter.    Follow Up Recommendations  Supervision/Assistance - 24 hour;Home health PT     Equipment Recommendations  None recommended by PT    Recommendations for Other Services       Precautions / Restrictions Precautions Precautions: Fall    Mobility  Bed Mobility Overal bed mobility: Needs Assistance Bed Mobility: Supine to Sit     Supine to sit: Min guard;HOB elevated     General bed mobility comments: Incr time and effort  Transfers Overall transfer level: Needs assistance Equipment used: 4-wheeled walker Transfers: Sit to/from Stand Sit to Stand: Min assist         General transfer comment: Assist to bring hips up  Ambulation/Gait Ambulation/Gait assistance: Min guard Gait Distance (Feet): 90 Feet Assistive device: 4-wheeled walker Gait Pattern/deviations: Step-through pattern;Decreased stride length;Trunk flexed Gait velocity: decr Gait velocity interpretation: >2.62 ft/sec, indicative of community ambulatory General Gait Details: Assist for safety and lines   Stairs             Wheelchair Mobility    Modified Rankin (Stroke Patients Only)       Balance Overall balance assessment: Needs assistance;History of Falls Sitting-balance support: No upper extremity supported;Feet supported Sitting balance-Leahy Scale: Good     Standing balance  support: Bilateral upper extremity supported;Single extremity supported;During functional activity Standing balance-Leahy Scale: Poor Standing balance comment: rollator and min guard                            Cognition Arousal/Alertness: Awake/alert Behavior During Therapy: WFL for tasks assessed/performed Overall Cognitive Status: Impaired/Different from baseline                               Problem Solving: Slow processing        Exercises      General Comments        Pertinent Vitals/Pain Pain Assessment: No/denies pain    Home Living                      Prior Function            PT Goals (current goals can now be found in the care plan section) Progress towards PT goals: Progressing toward goals    Frequency    Min 3X/week      PT Plan Discharge plan needs to be updated    Co-evaluation              AM-PAC PT "6 Clicks" Mobility   Outcome Measure  Help needed turning from your back to your side while in a flat bed without using bedrails?: A Little Help needed moving from lying on your back to sitting on the side of a flat bed without using bedrails?: A Little Help needed moving to  and from a bed to a chair (including a wheelchair)?: A Little Help needed standing up from a chair using your arms (e.g., wheelchair or bedside chair)?: A Little Help needed to walk in hospital room?: A Little Help needed climbing 3-5 steps with a railing? : A Lot 6 Click Score: 17    End of Session Equipment Utilized During Treatment: Gait belt Activity Tolerance: Patient tolerated treatment well Patient left: in chair;with call bell/phone within reach;with chair alarm set Nurse Communication: Mobility status PT Visit Diagnosis: Repeated falls (R29.6);Muscle weakness (generalized) (M62.81);Difficulty in walking, not elsewhere classified (R26.2);Pain     Time: 1937-9024 PT Time Calculation (min) (ACUTE ONLY): 17 min  Charges:   $Gait Training: 8-22 mins                     [place] Pager [phone] Office [place] 02/27/2020, 3:38 PM

## 2020-02-27 NOTE — Care Management Important Message (Signed)
Important Message  Patient Details  Name: Shannon Lucas MRN: [id number] Date of Birth: Feb 10, 1931   Medicare Important Message Given:  Yes     Shelda Altes 02/27/2020, 9:26 AM

## 2020-02-27 NOTE — Discharge Summary (Signed)
Physician Discharge Summary  Shannon Lucas [facility]:[id number] DOB: [birth year] DOA: 02/22/2020  PCP: Manfred Shirts, PA  Admit date: 02/22/2020 Discharge date: 02/27/2020  Admitted From: Home Disposition: Home  Recommendations for Outpatient Follow-up:  1. Follow ups as below. 2. Recommend outpatient palliative care follow-up. 3. Please obtain CBC/CMP/Mag at follow up 4. Please follow up on the following pending results: None  Home Health: PT/OT/RN/aide Equipment/Devices: Hospital bed, lift, 3 in 1 commode, shower chair  Discharge Condition: Stable CODE STATUS: Full code  Follow-up Information    Manfred Shirts, PA. Schedule an appointment as soon as possible for a visit in 1 week(s).   Specialty: General Practice Contact information: [place] [address] [phone]        Larey Dresser, MD. Schedule an appointment as soon as possible for a visit in 2 week(s).   Specialty: Cardiology Contact information: [place] [address] [phone]            Hospital Course: 86 year old female with history of diabetes mellitus type 2, paroxysmal A. fib with tacky/bradycardia syndrome, chronic kidney disease stage IV, chronic systolic CHF, recent UTI was found down at home on 02/22/2020. Patient apparently felt weak and tripped and fell. In the ED she was found to have altered mental status with question of pneumonia/UTI and was found to be febrile at 103F.   She was treated with antibiotics for pneumonia and UTI empirically.  Started on IV Lasix by cardiology for acute on chronic systolic CHF likely after IV fluid resuscitation on admission.  Eventually transition to oral Lasix and cleared for discharge by cardiology from CHF standpoint.  Cardiac medications adjusted per cardiology recommendation.  Patient was evaluated by PT/OT who recommended SNF but family, daughter declined SNF placement.  Patient discharged home with home health and DME as  above.  See individual problem list below for more hospital course.  Discharge Diagnoses:  Acute respiratory failure with hypoxia-multifactorial including probable pneumonia and CHF exacerbation.  Resolved.  Stable on room air.  Sepsis due to presumed UTI/community-acquired pneumonia: Present on admission -Completing 5 days course of ceftriaxone and azithromycin.  Acute on chronic systolic CHF: Echo in 12/7515 with EF of 60 to 65% (35 to 40% in 2018).  Diuresed with IV Lasix and transition to p.o. Lasix.  Renal function is stable. -Discharge on Lasix 20 mg daily, metoprolol XL 100 mg daily, hydralazine 100 mg TID and Imdur 30 mg daily. -Entresto and Aldactone discontinued -No ACE/ARB/ARNI due to CKD-4. -Counseled on fluid and sodium restrictions as well as daily weight.  Acute metabolic encephalopathy-resolved.  Oriented x4 today.  Chronic CKD stage IV/azotemia: Relatively stable. -Recheck renal function in 1 week  Fall and rib fractures -Continue incentive spirometry and pain management.   Elevated LFTs/ALP: thought to be due to sepsis.  She is also on amiodarone and statin which could potentially contribute.  RUQ [place] with possible cirrhosis.  Transaminitis resolving. -Holding pravastatin for 5 days and resume at lower dose -Recheck in 1 week  Paroxysmal A. fib with tachybradycardia syndrome s/p dual-chamber PPM -Continue metoprolol and amiodarone and Xarelto  Uncontrolled DM-2 with hyperglycemia: A1c 7.1%.  On Lantus at home. Recent Labs    02/26/20 2148 02/27/20 0621 02/27/20 1141  GLUCAP 190* 150* 220*  -Decreased on Lantus to 20 units based on last 24-hours requirement   Thrombocytopenia: Could be due to sepsis and possible cirrhosis.  Improving. -Recheck CBC in 1 week.  Hypertension: Normotensive. -Cardiac  meds as above.  Dyslipidemia -Resume home pravastatin at reduced dose.  Debility/recent fall/physical deconditioning -PT/OT-recommended  SNF-family refused.  Discharged home with home health and DME.  Anxiety/depression: Stable. -Continue home medications  Goal of care: Patient with comorbidities as above.  Remains full code -Recommend outpatient palliative care follow-up   Discharge Instructions  Discharge Instructions    (HEART FAILURE PATIENTS) Call MD:  Anytime you have any of the following symptoms: 1) 3 pound weight gain in 24 hours or 5 pounds in 1 week 2) shortness of breath, with or without a dry hacking cough 3) swelling in the hands, feet or stomach 4) if you have to sleep on extra pillows at night in order to breathe.   Complete by: As directed    Call MD for:  difficulty breathing, headache or visual disturbances   Complete by: As directed    Call MD for:  extreme fatigue   Complete by: As directed    Call MD for:  persistant dizziness or light-headedness   Complete by: As directed    Call MD for:  severe uncontrolled pain   Complete by: As directed    Call MD for:  temperature >100.4   Complete by: As directed    Diet - low sodium heart healthy   Complete by: As directed    Diet Carb Modified   Complete by: As directed    Discharge instructions   Complete by: As directed    It has been a pleasure taking care of you! Your weight is hospitalized with low oxygen level likely due to pneumonia and congestive heart failure and possible urinary tract infection.  You completed antibiotic course for pneumonia and urinary tract infection.  We have made an adjustment to your home medications during these hospitalization. Please review your new medication list and the directions before you take your medications.  We recommend limiting your fluid intake to less than 1500 cc (6 cups) a day, and sodium intake to less than 2 g (2000 mg) a day.  We also suggest weighing the sulfa daily at the same time and keeping weight log.  Please follow-up with your primary care doctor and cardiologist in 1 to 2 weeks.  You may  have to make a phone call as soon as possible to schedule these follow-ups.   Take care,   Increase activity slowly   Complete by: As directed      Allergies as of 02/27/2020   No Known Allergies     Medication List    STOP taking these medications   benzonatate 100 MG capsule Commonly known as: TESSALON   Entresto 49-51 MG Generic drug: sacubitril-valsartan   nitrofurantoin (macrocrystal-monohydrate) 100 MG capsule Commonly known as: MACROBID   spironolactone 25 MG tablet Commonly known as: ALDACTONE     TAKE these medications   acetaminophen 500 MG tablet Commonly known as: TYLENOL Take 1,000 mg by mouth every 6 (six) hours as needed for moderate pain or headache.   amiodarone 200 MG tablet Commonly known as: PACERONE TAKE 1 TABLET BY MOUTH DAIY What changed: See the new instructions.   ergocalciferol 1.25 MG (50000 UT) capsule Commonly known as: VITAMIN D2 Take 50,000 Units by mouth every Friday. At night   ferrous sulfate 325 (65 FE) MG tablet Take 1 tablet (325 mg total) by mouth 2 (two) times daily with a meal. What changed: when to take this   furosemide 20 MG tablet Commonly known as: LASIX Take 1 tablet (20 mg  total) by mouth daily. Start taking on: February 28, 2020 What changed:   medication strength  additional instructions   hydrALAZINE 100 MG tablet Commonly known as: APRESOLINE Take 1 tablet (100 mg total) by mouth every 8 (eight) hours.   ipratropium 17 MCG/ACT inhaler Commonly known as: Atrovent HFA Inhale 2 puffs into the lungs every 6 (six) hours as needed for wheezing.   isosorbide mononitrate 30 MG 24 hr tablet Commonly known as: IMDUR Take 1 tablet (30 mg total) by mouth daily.   Lantus SoloStar 100 UNIT/ML Solostar Pen Generic drug: insulin glargine Inject 20 Units into the skin daily. What changed: how much to take   levothyroxine 50 MCG tablet Commonly known as: SYNTHROID Take 50 mcg by mouth daily.   metoprolol  succinate 100 MG 24 hr tablet Commonly known as: TOPROL-XL TAKE 1 TABLET BY MOUTH DAILY TAKE WITH OR IMMDIATELY FOLLOWING A MEAL What changed: See the new instructions.   oxyCODONE 5 MG immediate release tablet Commonly known as: Oxy IR/ROXICODONE Take 5 mg by mouth every 6 (six) hours as needed for pain.   polyethylene glycol powder 17 GM/SCOOP powder Commonly known as: MiraLax Take 17 g by mouth 2 (two) times daily as needed for moderate constipation.   pravastatin 40 MG tablet Commonly known as: Pravachol Take 1 tablet (40 mg total) by mouth every evening. Start taking on: March 02, 2020 What changed:   medication strength  how much to take  when to take this  These instructions start on March 02, 2020. If you are unsure what to do until then, ask your doctor or other care provider.   senna-docusate 8.6-50 MG tablet Commonly known as: Senokot-S Take 1 tablet by mouth 2 (two) times daily as needed for moderate constipation.   venlafaxine 75 MG tablet Commonly known as: EFFEXOR Take 75 mg by mouth daily.   Xarelto 15 MG Tabs tablet Generic drug: Rivaroxaban TAKE 1 TABLET BY MOUTH DAILY WITH SUPPER What changed:   how much to take  how to take this  when to take this  additional instructions            Durable Medical Equipment  (From admission, onward)         Start     Ordered   02/27/20 1124  For home use only DME Hospital bed  Once    Question Answer Comment  Length of Need Lifetime   Patient has (list medical condition): chronic systolic CHF   The above medical condition requires: Patient requires the ability to reposition frequently   Head must be elevated greater than: 30 degrees   Bed type Semi-electric   Hoyer Lift Yes   Support Surface: Gel Overlay      02/27/20 1123   02/27/20 1049  For home use only DME Bedside commode  Once    Question:  Patient needs a bedside commode to treat with the following condition  Answer:  Unsteady gait    02/27/20 1048   02/27/20 1048  For home use only DME Shower stool  Once     02/27/20 1048          Consultations:  Cardiology  Procedures/Studies:   CT Head Wo Contrast  Result Date: 02/22/2020 CLINICAL DATA:  Fall, found down EXAM: CT HEAD WITHOUT CONTRAST TECHNIQUE: Contiguous axial images were obtained from the base of the skull through the vertex without intravenous contrast. COMPARISON:  03/21/2019 FINDINGS: Brain: No evidence of acute infarction, hemorrhage, hydrocephalus, extra-axial collection  or mass lesion/mass effect. Mild cortical atrophy. Subcortical white matter and periventricular small vessel ischemic changes. 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 Vascular: Mild intracranial atherosclerosis. Skull: Normal. Negative for fracture or focal lesion. Sinuses/Orbits: The visualized paranasal sinuses are essentially clear. The mastoid air cells are unopacified. Other: None. IMPRESSION: No evidence of acute intracranial abnormality. Atrophy with small vessel ischemic changes. 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 Electronically Signed   By: Julian Hy M.D.   On: 02/22/2020 18:24   DG Chest Port 1 View  Result Date: 02/22/2020 CLINICAL DATA:  Confusion. EXAM: PORTABLE CHEST 1 VIEW COMPARISON:  None. FINDINGS: The right hilum is mildly prominent, possibly due to positioning. The heart, left hilum, and mediastinum are normal. Bilateral interstitial opacities. More patchy opacity on the right. No other acute abnormalities. IMPRESSION: 1. Bilateral interstitial opacities may represent edema or atypical infection. 2. Opacity is more patchy with a few more focal regions of opacity on the right. This could represent asymmetric edema versus developing pneumonia. Electronically Signed   By: Dorise Bullion III M.D   On: 02/22/2020 16:50   US Abdomen Limited RUQ  Result Date: 02/23/2020 CLINICAL DATA:  86 year old female with elevated LFTs. EXAM: ULTRASOUND ABDOMEN LIMITED RIGHT UPPER  QUADRANT COMPARISON:  CT abdomen pelvis dated 12/04/2018. FINDINGS: Gallbladder: Cholecystectomy. Common bile duct: Diameter: 6 mm Liver: There is mild coarsened liver echotexture with mild surface irregularity suggestive of early cirrhosis. Clinical correlation is recommended. Portal vein is patent on color Doppler imaging with normal direction of blood flow towards the liver. Other: Small right pleural effusion partially visualized. IMPRESSION: 1. Probable early cirrhosis.  Clinical correlation is recommended. 2. Cholecystectomy. 3. Small right pleural effusion. Electronically Signed   By: Anner Crete M.D.   On: 02/23/2020 19:30      Discharge Exam: Vitals:   02/27/20 0427 02/27/20 0824  BP: 139/69 116/76  Pulse: 75 72  Resp: (!) 24   Temp: 97.6 F (36.4 C) 97.6 F (36.4 C)  SpO2: 91% 93%    GENERAL: No acute distress.  Appears well.  HEENT: MMM.  Vision and hearing grossly intact.  NECK: Supple.  No apparent JVD.  RESP:  No IWOB. Good air movement bilaterally. CVS:  RRR. Heart sounds normal.  ABD/GI/GU: Bowel sounds present. Soft. Non tender.  MSK/EXT:  Moves extremities. No apparent deformity or edema.  SKIN: no apparent skin lesion or wound NEURO: Awake, alert and oriented appropriately.  No apparent focal neuro deficit. PSYCH: Calm. Normal affect.   The results of significant diagnostics from this hospitalization (including imaging, microbiology, ancillary and laboratory) are listed below for reference.     Microbiology: Recent Results (from the past 240 hour(s))  Blood Culture (routine x 2)     Status: None   Collection Time: 02/22/20  4:37 PM   Specimen: BLOOD  Result Value Ref Range Status   Specimen Description BLOOD BLOOD LEFT WRIST  Final   Special Requests   Final    BOTTLES DRAWN AEROBIC AND ANAEROBIC Blood Culture adequate volume   Culture   Final    NO GROWTH 5 DAYS Performed at [facility], [address] [place], [place] [address]    Report  Status 02/27/2020 FINAL  Final  Urine culture     Status: Abnormal   Collection Time: 02/22/20  6:33 PM   Specimen: In/Out Cath Urine  Result Value Ref Range Status   Specimen Description IN/OUT CATH URINE  Final   Special Requests NONE  Final  Culture (A)  Final    <10,000 COLONIES/mL INSIGNIFICANT GROWTH Performed at [facility] [address] [place], [place] [address]    Report Status 02/23/2020 FINAL  Final  SARS CORONAVIRUS 2 (TAT 6-24 HRS) Nasopharyngeal Nasopharyngeal Swab     Status: None   Collection Time: 02/22/20 10:48 PM   Specimen: Nasopharyngeal Swab  Result Value Ref Range Status   SARS Coronavirus 2 NEGATIVE NEGATIVE Final    Comment: (NOTE) SARS-CoV-2 target nucleic acids are NOT DETECTED. The SARS-CoV-2 RNA is generally detectable in upper and lower respiratory specimens during the acute phase of infection. Negative results do not preclude SARS-CoV-2 infection, do not rule out co-infections with other pathogens, and should not be used as the sole basis for treatment or other patient management decisions. Negative results must be combined with clinical observations, patient history, and epidemiological information. The expected result is Negative. Fact Sheet for Patients: SugarRoll.be Fact Sheet for Healthcare Providers: https://www.woods-mathews.com/ This test is not yet approved or cleared by the [place] FDA and  has been authorized for detection and/or diagnosis of SARS-CoV-2 by FDA under an Emergency Use Authorization (EUA). This EUA will remain  in effect (meaning this test can be used) for the duration of the COVID-19 declaration under Section 56 4(b)(1) of the Act, 21 U.S.C. section 360bbb-3(b)(1), unless the authorization is terminated or revoked sooner. Performed at [facility], [place] [address], [place], [place] [address]   Respiratory Panel by PCR     Status: None   Collection Time:  02/22/20 11:30 PM   Specimen: Nasopharyngeal Swab; Respiratory  Result Value Ref Range Status   Adenovirus NOT DETECTED NOT DETECTED Final   Coronavirus 229E NOT DETECTED NOT DETECTED Final    Comment: (NOTE) The Coronavirus on the Respiratory Panel, DOES NOT test for the novel  Coronavirus (2019 nCoV)    Coronavirus HKU1 NOT DETECTED NOT DETECTED Final   Coronavirus NL63 NOT DETECTED NOT DETECTED Final   Coronavirus OC43 NOT DETECTED NOT DETECTED Final   Metapneumovirus NOT DETECTED NOT DETECTED Final   Rhinovirus / Enterovirus NOT DETECTED NOT DETECTED Final   Influenza A NOT DETECTED NOT DETECTED Final   Influenza B NOT DETECTED NOT DETECTED Final   Parainfluenza Virus 1 NOT DETECTED NOT DETECTED Final   Parainfluenza Virus 2 NOT DETECTED NOT DETECTED Final   Parainfluenza Virus 3 NOT DETECTED NOT DETECTED Final   Parainfluenza Virus 4 NOT DETECTED NOT DETECTED Final   Respiratory Syncytial Virus NOT DETECTED NOT DETECTED Final   Bordetella pertussis NOT DETECTED NOT DETECTED Final   Chlamydophila pneumoniae NOT DETECTED NOT DETECTED Final   Mycoplasma pneumoniae NOT DETECTED NOT DETECTED Final    Comment: Performed at [facility], [place]. [address] [place], [place] [address]  Blood Culture (routine x 2)     Status: None (Preliminary result)   Collection Time: 02/23/20 12:47 AM   Specimen: BLOOD  Result Value Ref Range Status   Specimen Description BLOOD LEFT ARM  Final   Special Requests   Final    BOTTLES DRAWN AEROBIC AND ANAEROBIC Blood Culture adequate volume   Culture   Final    NO GROWTH 4 DAYS Performed at [facility], [place] [address], [place], [place] [address]    Report Status PENDING  Incomplete     Labs: BNP (last 3 results) Recent Labs    02/22/20 1714  BNP 7,989.2*   Basic Metabolic Panel: Recent Labs  Lab 02/23/20 0754  02/24/20 0311 02/25/20 0245 02/26/20 0324 02/27/20 0349  NA 137 137 133* 135 137  K 4.4 4.2 4.0 3.9 4.2  CL 106 105  102 101 104  CO2 20* 20* 20* 23 23  GLUCOSE 189* 88 135* 180* 152*  BUN 32* 39* 40* 39* 34*  CREATININE 2.01* 2.13* 2.04* 2.21* 2.01*  CALCIUM 8.4* 8.1* 8.1* 8.7* 9.1  MG 1.7 1.8 1.8 1.8  --    Liver Function Tests: Recent Labs  Lab 02/23/20 0754 02/24/20 0311 02/25/20 0757 02/26/20 0324 02/27/20 0349  AST 233* 153* 79* 44* 26  ALT 161* 147* 108* 84* 57*  ALKPHOS 132* 121 137* 153* 149*  BILITOT 1.1 0.8 0.9 0.8 0.8  PROT 5.8* 5.3* 5.3* 5.9* 5.3*  ALBUMIN 3.0* 2.7* 2.6* 2.8* 2.7*   No results for input(s): LIPASE, AMYLASE in the last 168 hours. Recent Labs  Lab 02/24/20 0311  AMMONIA 35   CBC: Recent Labs  Lab 02/23/20 0047 02/23/20 0754 02/24/20 0311 02/25/20 0245 02/26/20 0324  WBC 9.1 8.6 5.0 5.9 8.5  NEUTROABS 8.7* 7.9* 4.4 4.6 6.7  HGB 12.1 11.8* 10.9* 11.6* 12.9  HCT 38.2 37.3 33.9* 35.4* 38.7  MCV 102.1* 100.8* 100.3* 96.2 96.3  PLT 125* 102* 92* 107* 142*   Cardiac Enzymes: No results for input(s): CKTOTAL, CKMB, CKMBINDEX, TROPONINI in the last 168 hours. BNP: Invalid input(s): POCBNP CBG: Recent Labs  Lab 02/26/20 1128 02/26/20 1630 02/26/20 2148 02/27/20 0621 02/27/20 1141  GLUCAP 197* 219* 190* 150* 220*   D-Dimer No results for input(s): DDIMER in the last 72 hours. Hgb A1c Recent Labs    02/26/20 0324  HGBA1C 7.1*   Lipid Profile No results for input(s): CHOL, HDL, LDLCALC, TRIG, CHOLHDL, LDLDIRECT in the last 72 hours. Thyroid function studies No results for input(s): TSH, T4TOTAL, T3FREE, THYROIDAB in the last 72 hours.  Invalid input(s): FREET3 Anemia work up No results for input(s): VITAMINB12, FOLATE, FERRITIN, TIBC, IRON, RETICCTPCT in the last 72 hours. Urinalysis    Component Value Date/Time   COLORURINE AMBER (A) 02/22/2020 1833   APPEARANCEUR HAZY (A) 02/22/2020 1833   LABSPEC 1.020 02/22/2020 1833   PHURINE 5.0 02/22/2020 1833   GLUCOSEU 50 (A) 02/22/2020 1833   HGBUR MODERATE (A) 02/22/2020 1833   BILIRUBINUR  NEGATIVE 02/22/2020 1833   [place] 02/22/2020 1833   PROTEINUR 100 (A) 02/22/2020 1833   NITRITE POSITIVE (A) 02/22/2020 1833   LEUKOCYTESUR LARGE (A) 02/22/2020 1833   Sepsis Labs Invalid input(s): PROCALCITONIN,  WBC,  LACTICIDVEN   Time coordinating discharge: 45 minutes  SIGNED:  Mercy Riding, MD  [facility] 02/27/2020, 3:09 PM  If 7PM-7AM, please contact night-coverage [email or web address] Password [email or web address]

## 2020-02-27 NOTE — Discharge Instructions (Signed)

## 2020-02-27 NOTE — Progress Notes (Signed)
   Durable Medical Equipment (From admission, onward)       Start     Ordered  02/27/20 1115   For home use only DME Hospital bed  Once   Question Answer Comment Length of Need Lifetime  Patient has (list medical condition): chronic systolic CHF  The above medical condition requires: Patient requires the ability to reposition frequently  Head must be elevated greater than: 30 degrees  Bed type Semi-electric  Hoyer Lift Yes  Support Surface: Gel Overlay    02/27/20 1114  02/27/20 1049   For home use only DME Bedside commode  Once   Question:  Patient needs a bedside commode to treat with the following condition  Answer:  Unsteady gait  02/27/20 1048  02/27/20 1048   For home use only DME Shower stool  Once    02/27/20 1048

## 2020-02-27 NOTE — TOC Progression Note (Signed)
Transition of Care (TOC) - Progression Note  Marvetta Gibbons RN, BSN Transitions of Care Unit 4E- RN Case Manager [phone]    Patient Details  Name: Shannon Lucas MRN: [id number] Date of Birth: 02-02-1931  Transition of Care [facility]) CM/SW Contact  [place]ahlia Client, Romeo Rabon, RN Phone Number: 02/27/2020, 1:53 PM  Clinical Narrative:    Orders placed for DME- hospital bed, lift, BSC, shower stool, and HHRN/PT/OT/aide- Call made to [facility] with [employer] for DME needs- equipment to be delivered to home once approved. [place] referral already made to [facility] and they are following for discharge.    Expected Discharge Plan: [place] Barriers to Discharge: Continued Medical Work up  Expected Discharge Plan and Services Expected Discharge Plan: [place] In-house Referral: Clinical Social Work Discharge Planning Services: CM Consult Post Acute Care Choice: Durable Medical Equipment, Home Health Living arrangements for the past 2 months: Single Family Home                 DME Arranged: Hospital bed DME Agency: [employer] Date DME Agency Contacted: 02/27/20 Time DME Agency Contacted: 75 Representative spoke with at DME Agency: [place]: RN, PT, OT, Nurse's Aide [place] Agency: [place] ([place]) Date [place]: 02/26/20 Time [place]: 1612 Representative spoke with at [place]: [place] ([place]) Interventions    Readmission Risk Interventions No flowsheet data found.

## 2020-02-28 LAB — CULTURE, BLOOD (ROUTINE X 2)
Culture: NO GROWTH
Special Requests: ADEQUATE

## 2020-03-03 ENCOUNTER — Other Ambulatory Visit: Payer: Self-pay

## 2020-03-03 ENCOUNTER — Inpatient Hospital Stay (HOSPITAL_COMMUNITY)
Admission: EM | Admit: 2020-03-03 | Discharge: 2020-03-07 | DRG: 291 | Disposition: A | Discharge status: Home Health | Payer: Medicare Other | Attending: Internal Medicine | Admitting: Internal Medicine

## 2020-03-03 ENCOUNTER — Emergency Department (HOSPITAL_COMMUNITY): Payer: Medicare Other

## 2020-03-03 ENCOUNTER — Encounter (HOSPITAL_COMMUNITY): Payer: Self-pay | Admitting: Emergency Medicine

## 2020-03-03 DIAGNOSIS — I5022 Chronic systolic (congestive) heart failure: Secondary | ICD-10-CM

## 2020-03-03 DIAGNOSIS — E785 Hyperlipidemia, unspecified: Secondary | ICD-10-CM | POA: Diagnosis present

## 2020-03-03 DIAGNOSIS — Z823 Family history of stroke: Secondary | ICD-10-CM

## 2020-03-03 DIAGNOSIS — K219 Gastro-esophageal reflux disease without esophagitis: Secondary | ICD-10-CM | POA: Diagnosis present

## 2020-03-03 DIAGNOSIS — Z8673 Personal history of transient ischemic attack (TIA), and cerebral infarction without residual deficits: Secondary | ICD-10-CM | POA: Diagnosis not present

## 2020-03-03 DIAGNOSIS — Z20822 Contact with and (suspected) exposure to covid-19: Secondary | ICD-10-CM | POA: Diagnosis present

## 2020-03-03 DIAGNOSIS — N184 Chronic kidney disease, stage 4 (severe): Secondary | ICD-10-CM | POA: Diagnosis present

## 2020-03-03 DIAGNOSIS — J9601 Acute respiratory failure with hypoxia: Secondary | ICD-10-CM | POA: Insufficient documentation

## 2020-03-03 DIAGNOSIS — Z79890 Hormone replacement therapy (postmenopausal): Secondary | ICD-10-CM

## 2020-03-03 DIAGNOSIS — E1122 Type 2 diabetes mellitus with diabetic chronic kidney disease: Secondary | ICD-10-CM | POA: Diagnosis present

## 2020-03-03 DIAGNOSIS — R0602 Shortness of breath: Secondary | ICD-10-CM | POA: Diagnosis present

## 2020-03-03 DIAGNOSIS — Z820 Family history of epilepsy and other diseases of the nervous system: Secondary | ICD-10-CM | POA: Diagnosis not present

## 2020-03-03 DIAGNOSIS — Z950 Presence of cardiac pacemaker: Secondary | ICD-10-CM

## 2020-03-03 DIAGNOSIS — D539 Nutritional anemia, unspecified: Secondary | ICD-10-CM | POA: Diagnosis present

## 2020-03-03 DIAGNOSIS — I130 Hypertensive heart and chronic kidney disease with heart failure and stage 1 through stage 4 chronic kidney disease, or unspecified chronic kidney disease: Principal | ICD-10-CM | POA: Diagnosis present

## 2020-03-03 DIAGNOSIS — Z79899 Other long term (current) drug therapy: Secondary | ICD-10-CM | POA: Diagnosis not present

## 2020-03-03 DIAGNOSIS — K567 Ileus, unspecified: Secondary | ICD-10-CM

## 2020-03-03 DIAGNOSIS — I5023 Acute on chronic systolic (congestive) heart failure: Secondary | ICD-10-CM | POA: Diagnosis present

## 2020-03-03 DIAGNOSIS — Z85820 Personal history of malignant melanoma of skin: Secondary | ICD-10-CM

## 2020-03-03 DIAGNOSIS — I480 Paroxysmal atrial fibrillation: Secondary | ICD-10-CM | POA: Diagnosis present

## 2020-03-03 DIAGNOSIS — E119 Type 2 diabetes mellitus without complications: Secondary | ICD-10-CM

## 2020-03-03 DIAGNOSIS — Z833 Family history of diabetes mellitus: Secondary | ICD-10-CM

## 2020-03-03 DIAGNOSIS — R778 Other specified abnormalities of plasma proteins: Secondary | ICD-10-CM | POA: Diagnosis present

## 2020-03-03 DIAGNOSIS — F329 Major depressive disorder, single episode, unspecified: Secondary | ICD-10-CM | POA: Diagnosis present

## 2020-03-03 DIAGNOSIS — I495 Sick sinus syndrome: Secondary | ICD-10-CM | POA: Diagnosis present

## 2020-03-03 DIAGNOSIS — K5909 Other constipation: Secondary | ICD-10-CM | POA: Diagnosis not present

## 2020-03-03 DIAGNOSIS — I5043 Acute on chronic combined systolic (congestive) and diastolic (congestive) heart failure: Secondary | ICD-10-CM

## 2020-03-03 DIAGNOSIS — R630 Anorexia: Secondary | ICD-10-CM

## 2020-03-03 DIAGNOSIS — I10 Essential (primary) hypertension: Secondary | ICD-10-CM | POA: Diagnosis present

## 2020-03-03 DIAGNOSIS — I252 Old myocardial infarction: Secondary | ICD-10-CM

## 2020-03-03 DIAGNOSIS — E039 Hypothyroidism, unspecified: Secondary | ICD-10-CM | POA: Diagnosis present

## 2020-03-03 DIAGNOSIS — Z7901 Long term (current) use of anticoagulants: Secondary | ICD-10-CM | POA: Diagnosis not present

## 2020-03-03 DIAGNOSIS — R7989 Other specified abnormal findings of blood chemistry: Secondary | ICD-10-CM | POA: Diagnosis present

## 2020-03-03 LAB — CBC
HCT: 34.6 % — ABNORMAL LOW (ref 36.0–46.0)
Hemoglobin: 11 g/dL — ABNORMAL LOW (ref 12.0–15.0)
MCH: 32.2 pg (ref 26.0–34.0)
MCHC: 31.8 g/dL (ref 30.0–36.0)
MCV: 101.2 fL — ABNORMAL HIGH (ref 80.0–100.0)
Platelets: 232 10*3/uL (ref 150–400)
RBC: 3.42 MIL/uL — ABNORMAL LOW (ref 3.87–5.11)
RDW: 14.9 % (ref 11.5–15.5)
WBC: 10.9 10*3/uL — ABNORMAL HIGH (ref 4.0–10.5)
nRBC: 0 % (ref 0.0–0.2)

## 2020-03-03 LAB — BASIC METABOLIC PANEL
Anion gap: 10 (ref 5–15)
BUN: 25 mg/dL — ABNORMAL HIGH (ref 8–23)
CO2: 24 mmol/L (ref 22–32)
Calcium: 9.5 mg/dL (ref 8.9–10.3)
Chloride: 104 mmol/L (ref 98–111)
Creatinine, Ser: 1.74 mg/dL — ABNORMAL HIGH (ref 0.44–1.00)
GFR calc Af Amer: 30 mL/min — ABNORMAL LOW (ref >60)
GFR calc non Af Amer: 26 mL/min — ABNORMAL LOW (ref >60)
Glucose, Bld: 225 mg/dL — ABNORMAL HIGH (ref 70–99)
Potassium: 5 mmol/L (ref 3.5–5.1)
Sodium: 138 mmol/L (ref 135–145)

## 2020-03-03 LAB — LACTIC ACID, PLASMA
Lactic Acid, Venous: 1.7 mmol/L (ref 0.5–1.9)
Lactic Acid, Venous: 1.3 mmol/L (ref 0.5–1.9)

## 2020-03-03 LAB — URINALYSIS, ROUTINE W REFLEX MICROSCOPIC
Bilirubin Urine: NEGATIVE
Glucose, UA: NEGATIVE mg/dL
Hgb urine dipstick: NEGATIVE
Ketones, ur: NEGATIVE mg/dL
Leukocytes,Ua: NEGATIVE
Nitrite: NEGATIVE
Protein, ur: NEGATIVE mg/dL
Specific Gravity, Urine: 1.009 (ref 1.005–1.030)
pH: 7 (ref 5.0–8.0)

## 2020-03-03 LAB — BRAIN NATRIURETIC PEPTIDE: B Natriuretic Peptide: 804.5 pg/mL — ABNORMAL HIGH (ref 0.0–100.0)

## 2020-03-03 LAB — CBG MONITORING, ED
Glucose-Capillary: 183 mg/dL — ABNORMAL HIGH (ref 70–99)
Glucose-Capillary: 173 mg/dL — ABNORMAL HIGH (ref 70–99)

## 2020-03-03 LAB — POC OCCULT BLOOD, ED: Fecal Occult Bld: NEGATIVE

## 2020-03-03 LAB — MAGNESIUM: Magnesium: 2.1 mg/dL (ref 1.7–2.4)

## 2020-03-03 LAB — GLUCOSE, CAPILLARY
Glucose-Capillary: 155 mg/dL — ABNORMAL HIGH (ref 70–99)
Glucose-Capillary: 178 mg/dL — ABNORMAL HIGH (ref 70–99)

## 2020-03-03 LAB — TROPONIN I (HIGH SENSITIVITY)
Troponin I (High Sensitivity): 30 ng/L — ABNORMAL HIGH (ref <18)
Troponin I (High Sensitivity): 30 ng/L — ABNORMAL HIGH (ref <18)
Troponin I (High Sensitivity): 22 ng/L — ABNORMAL HIGH (ref <18)

## 2020-03-03 LAB — VITAMIN B12: Vitamin B-12: 527 pg/mL (ref 180–914)

## 2020-03-03 LAB — RESPIRATORY PANEL BY RT PCR (FLU A&B, COVID)
Influenza A by PCR: NEGATIVE
Influenza B by PCR: NEGATIVE
SARS Coronavirus 2 by RT PCR: NEGATIVE

## 2020-03-03 LAB — PHOSPHORUS: Phosphorus: 2.9 mg/dL (ref 2.5–4.6)

## 2020-03-03 LAB — TSH: TSH: 4.489 u[IU]/mL (ref 0.350–4.500)

## 2020-03-03 MED ORDER — FUROSEMIDE 10 MG/ML IJ SOLN
40.0000 mg | Freq: Once | INTRAMUSCULAR | Status: AC
  Administered 2020-03-03: 40 mg via INTRAVENOUS
  Filled 2020-03-03: qty 4

## 2020-03-03 MED ORDER — INSULIN GLARGINE 100 UNIT/ML ~~LOC~~ SOLN
20.0000 [IU] | Freq: Every day | SUBCUTANEOUS | Status: DC
  Administered 2020-03-03 – 2020-03-07 (×5): 20 [IU] via SUBCUTANEOUS
  Filled 2020-03-03 (×5): qty 0.2

## 2020-03-03 MED ORDER — VENLAFAXINE HCL 75 MG PO TABS
75.0000 mg | ORAL_TABLET | Freq: Every day | ORAL | Status: DC
  Administered 2020-03-03 – 2020-03-07 (×5): 75 mg via ORAL
  Filled 2020-03-03 (×5): qty 1

## 2020-03-03 MED ORDER — LEVOTHYROXINE SODIUM 50 MCG PO TABS
50.0000 ug | ORAL_TABLET | Freq: Every day | ORAL | Status: DC
  Administered 2020-03-03 – 2020-03-06 (×4): 50 ug via ORAL
  Filled 2020-03-03 (×5): qty 1

## 2020-03-03 MED ORDER — FERROUS SULFATE 325 (65 FE) MG PO TABS
325.0000 mg | ORAL_TABLET | Freq: Two times a day (BID) | ORAL | Status: DC
  Administered 2020-03-03 – 2020-03-07 (×8): 325 mg via ORAL
  Filled 2020-03-03 (×8): qty 1

## 2020-03-03 MED ORDER — ONDANSETRON HCL 4 MG/2ML IJ SOLN: 4.0000 mg | Freq: Four times a day (QID) | INTRAMUSCULAR | Status: DC | PRN

## 2020-03-03 MED ORDER — ACETAMINOPHEN 650 MG RE SUPP: 650.0000 mg | Freq: Four times a day (QID) | RECTAL | Status: DC | PRN

## 2020-03-03 MED ORDER — IPRATROPIUM BROMIDE 0.02 % IN SOLN: 0.5000 mg | Freq: Four times a day (QID) | RESPIRATORY_TRACT | Status: DC | PRN

## 2020-03-03 MED ORDER — METOPROLOL SUCCINATE ER 100 MG PO TB24
100.0000 mg | ORAL_TABLET | Freq: Every day | ORAL | Status: DC
  Administered 2020-03-03 – 2020-03-07 (×5): 100 mg via ORAL
  Filled 2020-03-03 (×3): qty 1
  Filled 2020-03-03: qty 4
  Filled 2020-03-03: qty 1

## 2020-03-03 MED ORDER — IPRATROPIUM BROMIDE HFA 17 MCG/ACT IN AERS
2.0000 | INHALATION_SPRAY | Freq: Four times a day (QID) | RESPIRATORY_TRACT | Status: DC | PRN
  Filled 2020-03-03: qty 12.9

## 2020-03-03 MED ORDER — ISOSORBIDE MONONITRATE ER 30 MG PO TB24
30.0000 mg | ORAL_TABLET | Freq: Every day | ORAL | Status: DC
  Administered 2020-03-03 – 2020-03-07 (×5): 30 mg via ORAL
  Filled 2020-03-03 (×5): qty 1

## 2020-03-03 MED ORDER — FUROSEMIDE 10 MG/ML IJ SOLN
40.0000 mg | Freq: Every day | INTRAMUSCULAR | Status: DC
  Administered 2020-03-04 – 2020-03-07 (×4): 40 mg via INTRAVENOUS
  Filled 2020-03-03 (×4): qty 4

## 2020-03-03 MED ORDER — AMIODARONE HCL 200 MG PO TABS
200.0000 mg | ORAL_TABLET | Freq: Every day | ORAL | Status: DC
  Administered 2020-03-03 – 2020-03-07 (×5): 200 mg via ORAL
  Filled 2020-03-03 (×4): qty 1

## 2020-03-03 MED ORDER — INSULIN ASPART 100 UNIT/ML ~~LOC~~ SOLN
0.0000 [IU] | Freq: Three times a day (TID) | SUBCUTANEOUS | Status: DC
  Administered 2020-03-03 – 2020-03-04 (×3): 3 [IU] via SUBCUTANEOUS
  Administered 2020-03-04: 5 [IU] via SUBCUTANEOUS
  Administered 2020-03-05: 3 [IU] via SUBCUTANEOUS
  Administered 2020-03-05: 2 [IU] via SUBCUTANEOUS
  Administered 2020-03-05: 1 [IU] via SUBCUTANEOUS
  Administered 2020-03-06: 3 [IU] via SUBCUTANEOUS
  Administered 2020-03-06: 5 [IU] via SUBCUTANEOUS
  Administered 2020-03-07: 3 [IU] via SUBCUTANEOUS

## 2020-03-03 MED ORDER — INSULIN ASPART 100 UNIT/ML ~~LOC~~ SOLN
0.0000 [IU] | Freq: Every day | SUBCUTANEOUS | Status: DC
  Administered 2020-03-05 – 2020-03-06 (×2): 2 [IU] via SUBCUTANEOUS

## 2020-03-03 MED ORDER — ACETAMINOPHEN 325 MG PO TABS
650.0000 mg | ORAL_TABLET | Freq: Four times a day (QID) | ORAL | Status: DC | PRN
  Administered 2020-03-05 – 2020-03-07 (×4): 650 mg via ORAL
  Filled 2020-03-03 (×4): qty 2

## 2020-03-03 MED ORDER — PRAVASTATIN SODIUM 40 MG PO TABS
40.0000 mg | ORAL_TABLET | Freq: Every evening | ORAL | Status: DC
  Administered 2020-03-04 – 2020-03-06 (×3): 40 mg via ORAL
  Filled 2020-03-03 (×4): qty 1

## 2020-03-03 MED ORDER — ONDANSETRON HCL 4 MG PO TABS: 4.0000 mg | ORAL_TABLET | Freq: Four times a day (QID) | ORAL | Status: DC | PRN

## 2020-03-03 MED ORDER — RIVAROXABAN 15 MG PO TABS
15.0000 mg | ORAL_TABLET | Freq: Every day | ORAL | Status: DC
  Administered 2020-03-03 – 2020-03-06 (×4): 15 mg via ORAL
  Filled 2020-03-03 (×4): qty 1

## 2020-03-03 MED ORDER — HYDRALAZINE HCL 50 MG PO TABS
100.0000 mg | ORAL_TABLET | Freq: Three times a day (TID) | ORAL | Status: DC
  Administered 2020-03-03 – 2020-03-07 (×12): 100 mg via ORAL
  Filled 2020-03-03 (×14): qty 2

## 2020-03-03 NOTE — H&P (Signed)
History and Physical    Shannon Lucas [id number] DOB: May 30, 1931 DOA: 03/03/2020  PCP: Manfred Shirts, PA  Patient coming from: Home I have personally briefly reviewed patient's old medical records in [place]  Chief Complaint: Shortness of breath  HPI: Shannon Lucas is a 86 y.o. female with medical history significant of chronic systolic congestive heart failure, paroxysmal A. Fib-on Xarelto, type 2 diabetes mellitus, hypertension, hyperlipidemia, GERD tachybradycardia syndrome, CKD stage III, presents to emergency department due to shortness of breath.  Patient's daughter at bedside is the historian.  She mentioned that patient started having shortness of breath this morning associated with orthopnea, PND, nausea and dizziness.  Has chronic leg swelling-denies worsening, erythema, pain, fever, chills, headache, blurry vision, chest pain, cough, congestion, wheezing, urinary symptoms.  Had dark-colored discolored stool yesterday at 4 PM.  No abdominal pain, vomiting, decreased appetite, generalized weakness or lethargy.   Recently hospitalized due to acute hypoxemic respiratory failure-secondary to multifactorial including probable pneumonia, CHF exacerbation and sepsis due to presumed UTI.  She completed 5 days of Rocephin and azithromycin.  Her Entresto and Aldactone was discontinued on previous admission.  She lives with her daughter at home.  No history of smoking, alcohol, illicit drug use.  She is compliant with her home meds.  ED Course: Upon arrival to ED: Patient afebrile, respiratory rate in 20s, heart rate in 60s, blood pressure: 171/74, maintaining oxygen saturation in 90s on room air.  CBC shows leukocytosis of 10.9 and macrocytic anemia.  CMP shows stable CKD, troponin: 30x2, BNP: 804, COVID-19 negative, occult blood negative.  Chest x-ray shows slight improvement in bilateral interstitial infiltrates, likely interstitial pulmonary edema.  No other change.  X-ray abdomen  shows findings may represent mild ileus.  No signs of obstruction.  Moderate stool burden in the proximal colon.  Acute right 10th rib fracture laterally.  Probable small basilar pleural effusions.  Patient received Lasix 40 IV once in ED.  [facility] hospitalist consulted for admission for shortness of breath.  Review of Systems: As per HPI otherwise negative.    Past Medical History:  Diagnosis Date  . A-fib ([place])   . Arthritis    "a little bit qwhere" (02/28/2017)  . CHF (congestive heart failure) ([place])   . CKD (chronic kidney disease), stage III    [name] 02/28/2017  . Depression   . GERD (gastroesophageal reflux disease)   . High cholesterol   . Hypertension   . Melanoma of back ([place])   . Myocardial infarction [facility])    "one dr said I'd had 1" (02/28/2017)  . Stroke [facility]) ~ 2010; ~2012   "affected the right side but I fully recovered; just a light one" (02/28/2017)  . Type II diabetes mellitus ([place])     Past Surgical History:  Procedure Laterality Date  . APPENDECTOMY    . FALSE ANEURYSM REPAIR Right 03/08/2017   Procedure: REPAIR RIGHT RADIAL FALSE ANEURYSM;  Surgeon: Rosetta Posner, MD;  Location: [facility];  Service: Vascular;  Laterality: Right;  . KNEE SURGERY Left 1970s   "I have 1/3 of my knee left in there"  . LAPAROSCOPIC CHOLECYSTECTOMY    . MELANOMA EXCISION     "back"  . PACEMAKER IMPLANT N/A 11/20/2017   Procedure: PACEMAKER IMPLANT;  Surgeon: Constance Haw, MD;  Location: [place] CV LAB;  Service: Cardiovascular;  Laterality: N/A;  . RIGHT/LEFT HEART CATH AND CORONARY ANGIOGRAPHY N/A 03/06/2017   Procedure: Right/Left Heart Cath and Coronary Angiography;  Surgeon:  Larey Dresser, MD;  Location: [place] CV LAB;  Service: Cardiovascular;  Laterality: N/A;  . TUBAL LIGATION       reports that she has quit smoking. She has never used smokeless tobacco. She reports that she does not drink alcohol or use drugs.  No Known Allergies  Family History  Problem  Relation Age of Onset  . Diabetes Mother   . Stroke Father   . Parkinson's disease Brother     Prior to Admission medications   Medication Sig Start Date End Date Taking? Authorizing Provider  acetaminophen (TYLENOL) 500 MG tablet Take 1,000 mg by mouth every 6 (six) hours as needed for moderate pain or headache.   Yes [provider]  amiodarone (PACERONE) 200 MG tablet TAKE 1 TABLET BY MOUTH [place] Patient taking differently: Take 200 mg by mouth daily.  11/05/19  Yes Bensimhon, Shaune Pascal, MD  ergocalciferol (VITAMIN D2) 50000 units capsule Take 50,000 Units by mouth every Friday. At night   Yes [provider]  ferrous sulfate 325 (65 FE) MG tablet Take 1 tablet (325 mg total) by mouth 2 (two) times daily with a meal. 02/27/20  Yes Gonfa, Taye T, MD  furosemide (LASIX) 20 MG tablet Take 1 tablet (20 mg total) by mouth daily. 02/28/20  Yes Mercy Riding, MD  hydrALAZINE (APRESOLINE) 100 MG tablet Take 1 tablet (100 mg total) by mouth every 8 (eight) hours. 02/27/20  Yes Mercy Riding, MD  ipratropium (ATROVENT HFA) 17 MCG/ACT inhaler Inhale 2 puffs into the lungs every 6 (six) hours as needed for wheezing. 09/06/17  Yes Larey Dresser, MD  isosorbide mononitrate (IMDUR) 30 MG 24 hr tablet Take 1 tablet (30 mg total) by mouth daily. 03/19/19  Yes Larey Dresser, MD  LANTUS SOLOSTAR 100 UNIT/ML Solostar Pen Inject 20 Units into the skin daily. 02/27/20  Yes Mercy Riding, MD  levothyroxine (SYNTHROID) 50 MCG tablet Take 50 mcg by mouth daily. 01/29/20  Yes [provider]  metoprolol succinate (TOPROL-XL) 100 MG 24 hr tablet TAKE 1 TABLET BY MOUTH DAILY TAKE WITH OR IMMDIATELY FOLLOWING A MEAL Patient taking differently: Take 100 mg by mouth daily.  02/04/19  Yes Larey Dresser, MD  oxyCODONE (OXY IR/ROXICODONE) 5 MG immediate release tablet Take 5 mg by mouth every 6 (six) hours as needed for pain. 02/21/20  Yes [provider]  polyethylene glycol powder (MIRALAX)  17 GM/SCOOP powder Take 17 g by mouth 2 (two) times daily as needed for moderate constipation. 02/27/20  Yes Mercy Riding, MD  pravastatin (PRAVACHOL) 40 MG tablet Take 1 tablet (40 mg total) by mouth every evening. 03/02/20 08/29/20 Yes Gonfa, Charlesetta Ivory, MD  senna-docusate (SENOKOT-S) 8.6-50 MG tablet Take 1 tablet by mouth 2 (two) times daily as needed for moderate constipation. 02/27/20  Yes Mercy Riding, MD  venlafaxine (EFFEXOR) 75 MG tablet Take 75 mg by mouth daily.  02/03/17  Yes [provider]  XARELTO 15 MG TABS tablet TAKE 1 TABLET BY MOUTH DAILY WITH SUPPER Patient taking differently: Take 15 mg by mouth daily with supper.  03/19/19  Yes Larey Dresser, MD    Physical Exam: Vitals:   03/03/20 1145 03/03/20 1200 03/03/20 1315 03/03/20 1330  BP: (!) 176/71 (!) 177/76    Pulse:   61   Resp: (!) 23   (!) 21  Temp:      TempSrc:      SpO2:   93%  Weight:      Height:        Constitutional: NAD, calm, comfortable, communicating well, on room air Eyes: PERRL, lids and conjunctivae normal ENMT: Mucous membranes are moist. Posterior pharynx clear of any exudate or lesions.Normal dentition.  Neck: normal, supple, no masses, no thyromegaly Respiratory: Slightly tachypneic, clear to auscultation bilaterally, no wheezing, no crackles.  No accessory muscle use.  Cardiovascular: Regular rate and rhythm, no murmurs / rubs / gallops.  Bilateral 2+ pitting edema positive. 2+ pedal pulses. No carotid bruits.  Abdomen: Distended, soft on palpation, no tenderness, no masses palpated. No hepatosplenomegaly. Bowel sounds positive.  Musculoskeletal: no clubbing / cyanosis. No joint deformity upper and lower extremities. Good ROM, no contractures. Normal muscle tone.  Skin: no rashes, lesions, ulcers. No induration Neurologic: CN 2-12 grossly intact. Sensation intact, DTR normal. Strength 5/5 in all 4.  Psychiatric: Normal judgment and insight. Alert and oriented x 3. Normal mood.    Labs  on Admission: I have personally reviewed following labs and imaging studies  CBC: Recent Labs  Lab 02/26/20 0324 03/03/20 0718  WBC 8.5 10.9*  NEUTROABS 6.7  --   HGB 12.9 11.0*  HCT 38.7 34.6*  MCV 96.3 101.2*  PLT 142* 409   Basic Metabolic Panel: Recent Labs  Lab 02/26/20 0324 02/27/20 0349 03/03/20 0718  NA 135 137 138  K 3.9 4.2 5.0  CL 101 104 104  CO2 23 23 24   GLUCOSE 180* 152* 225*  BUN 39* 34* 25*  CREATININE 2.21* 2.01* 1.74*  CALCIUM 8.7* 9.1 9.5  MG 1.8  --   --    GFR: Estimated Creatinine Clearance: 27.3 mL/min (A) (by C-G formula based on SCr of 1.74 mg/dL (H)). Liver Function Tests: Recent Labs  Lab 02/26/20 0324 02/27/20 0349  AST 44* 26  ALT 84* 57*  ALKPHOS 153* 149*  BILITOT 0.8 0.8  PROT 5.9* 5.3*  ALBUMIN 2.8* 2.7*   No results for input(s): LIPASE, AMYLASE in the last 168 hours. No results for input(s): AMMONIA in the last 168 hours. Coagulation Profile: No results for input(s): INR, PROTIME in the last 168 hours. Cardiac Enzymes: No results for input(s): CKTOTAL, CKMB, CKMBINDEX, TROPONINI in the last 168 hours. BNP (last 3 results) No results for input(s): PROBNP in the last 8760 hours. HbA1C: No results for input(s): HGBA1C in the last 72 hours. CBG: Recent Labs  Lab 02/26/20 1630 02/26/20 2148 02/27/20 0621 02/27/20 1141 02/27/20 1605  GLUCAP 219* 190* 150* 220* 202*   Lipid Profile: No results for input(s): CHOL, HDL, LDLCALC, TRIG, CHOLHDL, LDLDIRECT in the last 72 hours. Thyroid Function Tests: No results for input(s): TSH, T4TOTAL, FREET4, T3FREE, THYROIDAB in the last 72 hours. Anemia Panel: No results for input(s): VITAMINB12, FOLATE, FERRITIN, TIBC, IRON, RETICCTPCT in the last 72 hours. Urine analysis:    Component Value Date/Time   COLORURINE AMBER (A) 02/22/2020 1833   APPEARANCEUR HAZY (A) 02/22/2020 1833   LABSPEC 1.020 02/22/2020 1833   PHURINE 5.0 02/22/2020 1833   GLUCOSEU 50 (A) 02/22/2020 1833    HGBUR MODERATE (A) 02/22/2020 1833   BILIRUBINUR NEGATIVE 02/22/2020 [place] 02/22/2020 1833   PROTEINUR 100 (A) 02/22/2020 1833   NITRITE POSITIVE (A) 02/22/2020 1833   LEUKOCYTESUR LARGE (A) 02/22/2020 1833    Radiological Exams on Admission: DG Chest 2 View  Result Date: 03/03/2020 CLINICAL DATA:  Shortness of breath. Chest tightness. Recent right rib fractures. EXAM: CHEST - 2 VIEW COMPARISON:  02/21/2020  and 02/22/2020 FINDINGS: The interstitial infiltrates noted on the prior study have slightly improved, possibly representing pulmonary edema. Heart size and pulmonary vascularity are normal. Pacemaker in place. No visible acute bone abnormality. The recently diagnosed right rib fractures are not visible on this exam. Aortic atherosclerosis. IMPRESSION: Slight improvement in the bilateral interstitial infiltrates, likely interstitial pulmonary edema. No other change. Aortic Atherosclerosis (ICD10-I70.0). Electronically Signed   By: Lorriane Shire M.D.   On: 03/03/2020 08:15   DG Abd 1 View  Result Date: 03/03/2020 CLINICAL DATA:  No appetite, constipation EXAM: ABDOMEN - 1 VIEW COMPARISON:  CT abdomen and pelvis December 04, 2018 FINDINGS: Bowel gas pattern with stool throughout the proximal colon. There are scattered loops of gas-filled small bowel in the left hemiabdomen. Gas is present in the rectum and descending colon. Perhaps small basilar effusions. Signs of cholecystectomy. No abnormal calcifications though stool and gas projects over the renal contours. Right lateral tenth rib fracture with mild displacement. Has an appearance of an acute fracture. IMPRESSION: 1. Findings may represent mild ileus. No signs of obstruction. 2. Moderate stool burden in the proximal colon. 3. Acute right tenth rib fracture laterally. 4. Probable small basilar pleural effusions. Electronically Signed   By: Zetta Bills M.D.   On: 03/03/2020 12:59    EKG: Independently reviewed.  Atrial  paced rhythm, 61 bpm, no ST elevation or depression noted.  Assessment/Plan Principal Problem:   Chronic systolic heart failure (HCC) Active Problems:   Shortness of breath   Macrocytic anemia   Diabetes mellitus type II, controlled (HCC)   Paroxysmal atrial fibrillation (HCC)   GERD (gastroesophageal reflux disease)   Essential hypertension   Ileus (HCC)   Elevated troponin   Shortness of breath: -Multifactorial-due to recent rib fracture, underlying CHF, ileus.  She is afebrile has mild leukocytosis of 10.9.  She is maintaining oxygen saturation on room air.  Chest x-ray shows no pneumonia. -BNP: 804-was 1098 10 days ago.  Troponin 30x2.  No ACS symptoms.  COVID-19 negative.  -Admit patient on the floor.  On telemetry.  On continuous pulse ox. -She received Lasix 40 IV once in the ED -Continue home meds hydralazine, Imdur, metoprolol, statin -Continue Lasix 40 IV once daily.  Strict INO's and daily weight.  Monitor electrolytes.  Mild ileus: Noted on abdominal x-ray -Likely secondary to decreased mobility and opioids -Last bowel movement: Yesterday.  No vomiting or abdominal pain -POC occult blood negative -Encouraged early ambulation. -NPO.  Zofran as needed for nausea and vomiting -We will hold off surgery consult for now  Mildly elevated troponin: -Patient denies any ACS symptoms.  EKG no acute changes -Trend troponin.  Recent fall & Rib fracture: -Tylenol as needed for pain.  Avoid opioids due to ileus -Incentive spirometry  CKD stage IIIb: Stable -Continue to monitor.  Paroxysmal A. fib: Continue metoprolol, amiodarone and Xarelto -On telemetry.  Diabetes mellitus: -Continue Lantus.  Started patient on sliding scale insulin  Hypothyroidism: Continue Synthyroid -Check TSH  Macrocytic anemia: -Check B12, folate, TSH.  H&H is stable -Monitor  Hyperlipidemia: Continue statin  DVT prophylaxis: Xarelto/SCD/TED Code Status: Full code-confirmed with patient  Family Communication: Patient's daughter present at bedside.  Plan of care discussed with patient and her daughter at bedside in length and they verbalized understanding and agreed with it. Disposition Plan: Likely home in 1 to 2 days Consults called: None  admission status: Inpatient   Mckinley Jewel MD [facility] Pager [phone]  If 7PM-7AM, please contact night-coverage  [email or web address] Password [email or web address]  03/03/2020, 1:51 PM

## 2020-03-03 NOTE — ED Provider Notes (Addendum)
[facility] EMERGENCY DEPARTMENT Provider Note   CSN: [id number] Arrival date & time: 03/03/20  0703     History Chief Complaint  Patient presents with   Shortness of Breath    Shannon Lucas is a 86 y.o. female.  Patient with hx chf, htn, c/o sob and chest tightness in the past day. States recent admission with chf, and possible pna. Symptoms acute onset in past day, moderate, persistent, worse when lies flat. +bilateral lower leg and ankle swelling. Indicates is compliant w home meds and feels is urinating regularly. Occasional non prod cough. No sore throat or runny nose. No fever or chills. Chest tightness is constant sensation associated with the sob. No episodic, or exertional cp. No pleuritic chest pain.   The history is provided by the patient and the EMS personnel.  Shortness of Breath Associated symptoms: cough   Associated symptoms: no abdominal pain, no chest pain, no fever, no headaches, no neck pain, no rash, no sore throat and no vomiting        Past Medical History:  Diagnosis Date   A-fib ([place])    Arthritis    "a little bit qwhere" (02/28/2017)   CHF (congestive heart failure) (HCC)    CKD (chronic kidney disease), stage III    [name] 02/28/2017   Depression    GERD (gastroesophageal reflux disease)    High cholesterol    Hypertension    Melanoma of back ([place])    Myocardial infarction [facility])    "one dr said I'd had 1" (02/28/2017)   Stroke ([place]) ~ 2010; ~2012   "affected the right side but I fully recovered; just a light one" (02/28/2017)   Type II diabetes mellitus [facility])     Patient Active Problem List   Diagnosis Date Noted   Palliative care by specialist    Goals of care, counseling/discussion    Sepsis ([place]) 02/22/2020   Thrombocytopenia ([place]) 02/22/2020   Pneumonia 02/22/2020   Proliferative diabetic retinopathy of both eyes without macular edema associated with type 2 diabetes mellitus ([place]) 09/27/2018    Pacemaker 06/25/2018   Tachy-brady syndrome ([place]) 11/20/2017   Vitamin D deficiency 96/22/2979   Chronic systolic heart failure ([place]) 03/15/2017   Pseudoaneurysm following procedure ([place])    Acute systolic congestive heart failure ([place])    AKI (acute kidney injury) ([place])    CHF exacerbation ([place]) 03/01/2017   Fall    Anemia    CKD (chronic kidney disease) stage 3, GFR 30-59 ml/min    Diabetes mellitus type II, controlled ([place])    Paroxysmal atrial fibrillation (HCC)    Chronic anticoagulation    Pure hypercholesterolemia    Shortness of breath 02/28/2017   Persistent cough for 3 weeks or longer 10/25/2016   Murmur 07/28/2016   Severe nonproliferative diabetic retinopathy of both eyes without macular edema associated with type 2 diabetes mellitus ([place]) 07/13/2016   Hyperparathyroidism due to renal insufficiency ([place]) 02/19/2016   Microalbuminuria due to type 2 diabetes mellitus ([place]) 02/19/2016   GERD (gastroesophageal reflux disease) 11/13/2015   Risk for falls 11/13/2015   TIA (transient ischemic attack) 04/15/2015   Essential hypertension 08/05/2008    Past Surgical History:  Procedure Laterality Date   APPENDECTOMY     FALSE ANEURYSM REPAIR Right 03/08/2017   Procedure: REPAIR RIGHT RADIAL FALSE ANEURYSM;  Surgeon: Rosetta Posner, MD;  Location: [facility];  Service: Vascular;  Laterality: Right;   KNEE SURGERY Left 1970s   "I have 1/3 of my  knee left in there"   LAPAROSCOPIC CHOLECYSTECTOMY     MELANOMA EXCISION     "back"   PACEMAKER IMPLANT N/A 11/20/2017   Procedure: PACEMAKER IMPLANT;  Surgeon: Constance Haw, MD;  Location: [facility];  Service: Cardiovascular;  Laterality: N/A;   RIGHT/LEFT HEART CATH AND CORONARY ANGIOGRAPHY N/A 03/06/2017   Procedure: Right/Left Heart Cath and Coronary Angiography;  Surgeon: Larey Dresser, MD;  Location: [facility];  Service: Cardiovascular;  Laterality: N/A;   TUBAL LIGATION        OB History   No obstetric history on file.     Family History  Problem Relation Age of Onset   Diabetes Mother    Stroke Father    Parkinson's disease Brother     Social History   Tobacco Use   Smoking status: Former Smoker   Smokeless tobacco: Never Used   Tobacco comment: 02/28/2017 "only smoked in the 1960s when we went out"  Substance Use Topics   Alcohol use: No   Drug use: No    Home Medications Prior to Admission medications   Medication Sig Start Date End Date Taking? Authorizing Provider  acetaminophen (TYLENOL) 500 MG tablet Take 1,000 mg by mouth every 6 (six) hours as needed for moderate pain or headache.   Yes [provider]  amiodarone (PACERONE) 200 MG tablet TAKE 1 TABLET BY MOUTH [place] Patient taking differently: Take 200 mg by mouth daily.  11/05/19  Yes Bensimhon, Shaune Pascal, MD  ergocalciferol (VITAMIN D2) 50000 units capsule Take 50,000 Units by mouth every Friday. At night   Yes [provider]  ferrous sulfate 325 (65 FE) MG tablet Take 1 tablet (325 mg total) by mouth 2 (two) times daily with a meal. 02/27/20  Yes Gonfa, Taye T, MD  furosemide (LASIX) 20 MG tablet Take 1 tablet (20 mg total) by mouth daily. 02/28/20  Yes Mercy Riding, MD  hydrALAZINE (APRESOLINE) 100 MG tablet Take 1 tablet (100 mg total) by mouth every 8 (eight) hours. 02/27/20  Yes Mercy Riding, MD  ipratropium (ATROVENT HFA) 17 MCG/ACT inhaler Inhale 2 puffs into the lungs every 6 (six) hours as needed for wheezing. 09/06/17  Yes Larey Dresser, MD  isosorbide mononitrate (IMDUR) 30 MG 24 hr tablet Take 1 tablet (30 mg total) by mouth daily. 03/19/19  Yes Larey Dresser, MD  LANTUS SOLOSTAR 100 UNIT/ML Solostar Pen Inject 20 Units into the skin daily. 02/27/20  Yes Mercy Riding, MD  levothyroxine (SYNTHROID) 50 MCG tablet Take 50 mcg by mouth daily. 01/29/20  Yes [provider]  metoprolol succinate (TOPROL-XL) 100 MG 24 hr tablet TAKE 1 TABLET BY  MOUTH DAILY TAKE WITH OR IMMDIATELY FOLLOWING A MEAL Patient taking differently: Take 100 mg by mouth daily.  02/04/19  Yes Larey Dresser, MD  oxyCODONE (OXY IR/ROXICODONE) 5 MG immediate release tablet Take 5 mg by mouth every 6 (six) hours as needed for pain. 02/21/20  Yes [provider]  polyethylene glycol powder (MIRALAX) 17 GM/SCOOP powder Take 17 g by mouth 2 (two) times daily as needed for moderate constipation. 02/27/20  Yes Mercy Riding, MD  pravastatin (PRAVACHOL) 40 MG tablet Take 1 tablet (40 mg total) by mouth every evening. 03/02/20 08/29/20 Yes Gonfa, Charlesetta Ivory, MD  senna-docusate (SENOKOT-S) 8.6-50 MG tablet Take 1 tablet by mouth 2 (two) times daily as needed for moderate constipation. 02/27/20  Yes Mercy Riding, MD  venlafaxine [facility])  75 MG tablet Take 75 mg by mouth daily.  02/03/17  Yes [provider]  XARELTO 15 MG TABS tablet TAKE 1 TABLET BY MOUTH DAILY WITH SUPPER Patient taking differently: Take 15 mg by mouth daily with supper.  03/19/19  Yes Larey Dresser, MD    Allergies    Patient has no known allergies.  Review of Systems   Review of Systems  Constitutional: Negative for fever.  HENT: Negative for sore throat.   Eyes: Negative for redness.  Respiratory: Positive for cough and shortness of breath.   Cardiovascular: Positive for leg swelling. Negative for chest pain and palpitations.  Gastrointestinal: Negative for abdominal pain and vomiting.  Genitourinary: Negative for dysuria and flank pain.  Musculoskeletal: Negative for back pain and neck pain.  Skin: Negative for rash.  Neurological: Negative for headaches.  Hematological: Does not bruise/bleed easily.  Psychiatric/Behavioral: Negative for confusion.    Physical Exam Updated Vital Signs BP (!) 171/74    Pulse 62    Temp 98.3 F (36.8 C) (Oral)    Resp (!) 22    Ht 1.651 m (5\' 5" )    Wt 101 kg    SpO2 100%    BMI 37.05 kg/m   Physical Exam Vitals and nursing note reviewed.   Constitutional:      Appearance: Normal appearance. She is well-developed.  HENT:     Head: Atraumatic.     Nose: Nose normal.     Mouth/Throat:     Mouth: Mucous membranes are moist.  Eyes:     General: No scleral icterus.    Conjunctiva/sclera: Conjunctivae normal.  Neck:     Trachea: No tracheal deviation.  Cardiovascular:     Rate and Rhythm: Normal rate and regular rhythm.     Pulses: Normal pulses.     Heart sounds: Normal heart sounds. No murmur. No friction rub. No gallop.   Pulmonary:     Effort: Pulmonary effort is normal. No respiratory distress.     Breath sounds: Normal breath sounds.     Comments: Basilar rales Abdominal:     General: Bowel sounds are normal. There is no distension.     Palpations: Abdomen is soft.     Tenderness: There is no abdominal tenderness. There is no guarding.  Genitourinary:    Comments: No cva tenderness.  Medium brown stool, no melena.  Musculoskeletal:     Cervical back: Normal range of motion and neck supple. No rigidity. No muscular tenderness.     Comments: Symmetric bilateral lower leg/ankle edema.   Skin:    General: Skin is warm and dry.     Findings: No rash.  Neurological:     Mental Status: She is alert.     Comments: Alert, speech normal.   Psychiatric:        Mood and Affect: Mood normal.      ED Results / Procedures / Treatments   Labs (all labs ordered are listed, but only abnormal results are displayed) Results for orders placed or performed during the hospital encounter of 03/03/20  Respiratory Panel by RT PCR (Flu A&B, Covid) - Nasopharyngeal Swab   Specimen: Nasopharyngeal Swab  Result Value Ref Range   SARS Coronavirus 2 by RT PCR NEGATIVE NEGATIVE   Influenza A by PCR NEGATIVE NEGATIVE   Influenza B by PCR NEGATIVE NEGATIVE  Basic metabolic panel  Result Value Ref Range   Sodium 138 135 - 145 mmol/L   Potassium 5.0 3.5 - 5.1  mmol/L   Chloride 104 98 - 111 mmol/L   CO2 24 22 - 32 mmol/L   Glucose,  Bld 225 (H) 70 - 99 mg/dL   BUN 25 (H) 8 - 23 mg/dL   Creatinine, Ser 1.74 (H) 0.44 - 1.00 mg/dL   Calcium 9.5 8.9 - 10.3 mg/dL   GFR calc non Af Amer 26 (L) >60 mL/min   GFR calc Af Amer 30 (L) >60 mL/min   Anion gap 10 5 - 15  CBC  Result Value Ref Range   WBC 10.9 (H) 4.0 - 10.5 K/uL   RBC 3.42 (L) 3.87 - 5.11 MIL/uL   Hemoglobin 11.0 (L) 12.0 - 15.0 g/dL   HCT 34.6 (L) 36.0 - 46.0 %   MCV 101.2 (H) 80.0 - 100.0 fL   MCH 32.2 26.0 - 34.0 pg   MCHC 31.8 30.0 - 36.0 g/dL   RDW 14.9 11.5 - 15.5 %   Platelets 232 150 - 400 K/uL   nRBC 0.0 0.0 - 0.2 %  Brain natriuretic peptide  Result Value Ref Range   B Natriuretic Peptide 804.5 (H) 0.0 - 100.0 pg/mL  POC occult blood, ED Provider will collect  Result Value Ref Range   Fecal Occult Bld NEGATIVE NEGATIVE  Troponin I (High Sensitivity)  Result Value Ref Range   Troponin I (High Sensitivity) 30 (H) <18 ng/L  Troponin I (High Sensitivity)  Result Value Ref Range   Troponin I (High Sensitivity) 30 (H) <18 ng/L   DG Chest 2 View  Result Date: 03/03/2020 CLINICAL DATA:  Shortness of breath. Chest tightness. Recent right rib fractures. EXAM: CHEST - 2 VIEW COMPARISON:  02/21/2020 and 02/22/2020 FINDINGS: The interstitial infiltrates noted on the prior study have slightly improved, possibly representing pulmonary edema. Heart size and pulmonary vascularity are normal. Pacemaker in place. No visible acute bone abnormality. The recently diagnosed right rib fractures are not visible on this exam. Aortic atherosclerosis. IMPRESSION: Slight improvement in the bilateral interstitial infiltrates, likely interstitial pulmonary edema. No other change. Aortic Atherosclerosis (ICD10-I70.0). Electronically Signed   By: Lorriane Shire M.D.   On: 03/03/2020 08:15   CT Head Wo Contrast  Result Date: 02/22/2020 CLINICAL DATA:  Fall, found down EXAM: CT HEAD WITHOUT CONTRAST TECHNIQUE: Contiguous axial images were obtained from the base of the skull  through the vertex without intravenous contrast. COMPARISON:  03/21/2019 FINDINGS: Brain: No evidence of acute infarction, hemorrhage, hydrocephalus, extra-axial collection or mass lesion/mass effect. Mild cortical atrophy. Subcortical white matter and periventricular small vessel ischemic changes. 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 Vascular: Mild intracranial atherosclerosis. Skull: Normal. Negative for fracture or focal lesion. Sinuses/Orbits: The visualized paranasal sinuses are essentially clear. The mastoid air cells are unopacified. Other: None. IMPRESSION: No evidence of acute intracranial abnormality. Atrophy with small vessel ischemic changes. 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 Electronically Signed   By: Julian Hy M.D.   On: 02/22/2020 18:24   DG Chest Port 1 View  Result Date: 02/22/2020 CLINICAL DATA:  Confusion. EXAM: PORTABLE CHEST 1 VIEW COMPARISON:  None. FINDINGS: The right hilum is mildly prominent, possibly due to positioning. The heart, left hilum, and mediastinum are normal. Bilateral interstitial opacities. More patchy opacity on the right. No other acute abnormalities. IMPRESSION: 1. Bilateral interstitial opacities may represent edema or atypical infection. 2. Opacity is more patchy with a few more focal regions of opacity on the right. This could represent asymmetric edema versus developing pneumonia. Electronically Signed   By: Dorise Bullion III M.D  On: 02/22/2020 16:50   US Abdomen Limited RUQ  Result Date: 02/23/2020 CLINICAL DATA:  86 year old female with elevated LFTs. EXAM: ULTRASOUND ABDOMEN LIMITED RIGHT UPPER QUADRANT COMPARISON:  CT abdomen pelvis dated 12/04/2018. FINDINGS: Gallbladder: Cholecystectomy. Common bile duct: Diameter: 6 mm Liver: There is mild coarsened liver echotexture with mild surface irregularity suggestive of early cirrhosis. Clinical correlation is recommended. Portal vein is patent on color Doppler imaging with normal direction of  blood flow towards the liver. Other: Small right pleural effusion partially visualized. IMPRESSION: 1. Probable early cirrhosis.  Clinical correlation is recommended. 2. Cholecystectomy. 3. Small right pleural effusion. Electronically Signed   By: Anner Crete M.D.   On: 02/23/2020 19:30    EKG EKG Interpretation  Date/Time:  Tuesday March 03 2020 07:09:24 EDT Ventricular Rate:  61 PR Interval:    QRS Duration: 76 QT Interval:  482 QTC Calculation: 485 R Axis:   45 Text Interpretation: Atrial-paced rhythm Low voltage QRS Nonspecific ST abnormality Confirmed by Lajean Saver [phone]) on 03/03/2020 9:35:25 AM   Radiology DG Chest 2 View  Result Date: 03/03/2020 CLINICAL DATA:  Shortness of breath. Chest tightness. Recent right rib fractures. EXAM: CHEST - 2 VIEW COMPARISON:  02/21/2020 and 02/22/2020 FINDINGS: The interstitial infiltrates noted on the prior study have slightly improved, possibly representing pulmonary edema. Heart size and pulmonary vascularity are normal. Pacemaker in place. No visible acute bone abnormality. The recently diagnosed right rib fractures are not visible on this exam. Aortic atherosclerosis. IMPRESSION: Slight improvement in the bilateral interstitial infiltrates, likely interstitial pulmonary edema. No other change. Aortic Atherosclerosis (ICD10-I70.0). Electronically Signed   By: Lorriane Shire M.D.   On: 03/03/2020 08:15   DG Abd 1 View  Result Date: 03/03/2020 CLINICAL DATA:  No appetite, constipation EXAM: ABDOMEN - 1 VIEW COMPARISON:  CT abdomen and pelvis December 04, 2018 FINDINGS: Bowel gas pattern with stool throughout the proximal colon. There are scattered loops of gas-filled small bowel in the left hemiabdomen. Gas is present in the rectum and descending colon. Perhaps small basilar effusions. Signs of cholecystectomy. No abnormal calcifications though stool and gas projects over the renal contours. Right lateral tenth rib fracture with mild  displacement. Has an appearance of an acute fracture. IMPRESSION: 1. Findings may represent mild ileus. No signs of obstruction. 2. Moderate stool burden in the proximal colon. 3. Acute right tenth rib fracture laterally. 4. Probable small basilar pleural effusions. Electronically Signed   By: Zetta Bills M.D.   On: 03/03/2020 12:59    Procedures Procedures (including critical care time)  Medications Ordered in ED Medications  furosemide (LASIX) injection 40 mg (has no administration in time range)    ED Course  I have reviewed the triage vital signs and the nursing notes.  Pertinent labs & imaging results that were available during my care of the patient were reviewed by me and considered in my medical decision making (see chart for details).    MDM Rules/Calculators/A&P                      Iv. Stat labs and imaging. Continuous pulse ox and monitor. Ecg.  Patients complaint cp and sob has broad differential dx including acs, chf, pna - conditions that carry significant risk and morbidity.   MDM Number of Diagnoses or Management Options   Amount and/or Complexity of Data Reviewed Clinical lab tests: ordered and reviewed Tests in the radiology section of CPT: ordered and reviewed Tests in the medicine section  of CPT: ordered and reviewed Decide to obtain previous medical records or to obtain history from someone other than the patient: yes Obtain history from someone other than the patient: yes Review and summarize past medical records: yes Independent visualization of images, tracings, or specimens: yes  Risk of Complications, Morbidity, and/or Mortality Presenting problems: high Diagnostic procedures: high Management options: high   Reviewed nursing notes and prior charts for additional history.  Recent admission reviewed, chf, and possible pna. Cultures negative.   Lasix iv.   CXR reviewed/interpreted by me - vascular congestion/chf.   Initial labs  reviewed/interpreted by me - lytes normal. Wbc 10.9. bnp and trop pending.   Additional labs reviewed/interpreted by me - bnp remains markedly elevated.   Daughter states concerned about bms, and poor po intake both in hospital and at home. Patient did have a bm today. No abd distension or tenderness on exam. Stool is brown in color. pts primary concern appears to be her breathing, and was sob this AM with room air pulse ox 89%. Additional has persistent vascular congestion/chf on cxr, and bnp remains markedly high.   Will consult medicine for admission - pt, imaging and cxr discussed - will admit.         Final Clinical Impression(s) / ED Diagnoses Final diagnoses:  None    Rx / DC Orders ED Discharge Orders    None         Lajean Saver, MD 03/03/20 1319

## 2020-03-03 NOTE — ED Notes (Signed)
Placed a external cath patient is resting with call bell in reach 

## 2020-03-03 NOTE — Plan of Care (Signed)
[facility] for possible [facility].   I presented to the bedside to see Shannon Lucas. She initially tells me that she came in because her breathing seemed more difficult over the last 24 hours. She attributes it to the hospital bed she has at home. She denies change in urine output, medication nonadherence, or orthopnea. She states that her LE edema is improved.   I spoke with the patients daughter who tells me the patient was actually sent to the ED for progressive abdominal distention, N/V, and absent bowel movements since discharge on 4/13. She has used multiple laxatives including miralax, senokot, and enemas. She had one small bowel movement 2 days ago that the daughter describes as black and tarry. She has never had a bowel obstruction. She has had her gallbladder removed but no other abdominal surgeries. She is not having any abdominal pain.   On PE she has a distended abdomen with hypoactive bowel sounds. Lungs are clear bilaterally. No JVD. Trace LE edema.   Discussed the additional history with Dr. Ashok Cordia. Patient does not qualify for the [facility] program at this point as she needs further evaluation for ileus, bowel obstruction, fecal impaction.   Thank you for the consult.   Please do not hesitate to call with questions/concerns.   Shannon Homes, MD 03/03/2020, 11:36 AM  Pager: [phone]

## 2020-03-03 NOTE — ED Triage Notes (Signed)
Pt arrives via EMS from home after recently being discharged after being admitted for a fall, broken ribs, PNA and UTI. Pt endorses SOB, dizziness and CP since 7 pm last night.

## 2020-03-03 NOTE — ED Notes (Signed)
Got patient on the monitor patient is resting with call bell in reach  ?

## 2020-03-04 LAB — CBC
HCT: 31.5 % — ABNORMAL LOW (ref 36.0–46.0)
Hemoglobin: 10.1 g/dL — ABNORMAL LOW (ref 12.0–15.0)
MCH: 32 pg (ref 26.0–34.0)
MCHC: 32.1 g/dL (ref 30.0–36.0)
MCV: 99.7 fL (ref 80.0–100.0)
Platelets: 208 10*3/uL (ref 150–400)
RBC: 3.16 MIL/uL — ABNORMAL LOW (ref 3.87–5.11)
RDW: 14.6 % (ref 11.5–15.5)
WBC: 9.4 10*3/uL (ref 4.0–10.5)
nRBC: 0 % (ref 0.0–0.2)

## 2020-03-04 LAB — COMPREHENSIVE METABOLIC PANEL
ALT: 27 U/L (ref 0–44)
AST: 18 U/L (ref 15–41)
Albumin: 3 g/dL — ABNORMAL LOW (ref 3.5–5.0)
Alkaline Phosphatase: 161 U/L — ABNORMAL HIGH (ref 38–126)
Anion gap: 6 (ref 5–15)
BUN: 25 mg/dL — ABNORMAL HIGH (ref 8–23)
CO2: 30 mmol/L (ref 22–32)
Calcium: 9.3 mg/dL (ref 8.9–10.3)
Chloride: 103 mmol/L (ref 98–111)
Creatinine, Ser: 1.69 mg/dL — ABNORMAL HIGH (ref 0.44–1.00)
GFR calc Af Amer: 31 mL/min — ABNORMAL LOW (ref >60)
GFR calc non Af Amer: 27 mL/min — ABNORMAL LOW (ref >60)
Glucose, Bld: 178 mg/dL — ABNORMAL HIGH (ref 70–99)
Potassium: 4.5 mmol/L (ref 3.5–5.1)
Sodium: 139 mmol/L (ref 135–145)
Total Bilirubin: 0.9 mg/dL (ref 0.3–1.2)
Total Protein: 5.9 g/dL — ABNORMAL LOW (ref 6.5–8.1)

## 2020-03-04 LAB — GLUCOSE, CAPILLARY
Glucose-Capillary: 163 mg/dL — ABNORMAL HIGH (ref 70–99)
Glucose-Capillary: 165 mg/dL — ABNORMAL HIGH (ref 70–99)
Glucose-Capillary: 232 mg/dL — ABNORMAL HIGH (ref 70–99)
Glucose-Capillary: 185 mg/dL — ABNORMAL HIGH (ref 70–99)
Glucose-Capillary: 163 mg/dL — ABNORMAL HIGH (ref 70–99)

## 2020-03-04 NOTE — Evaluation (Signed)
Physical Therapy Evaluation Patient Details Name: Shannon Lucas MRN: [id number] DOB: 07-30-1931 Today's Date: 03/04/2020   History of Present Illness  Pt is an 86 y/o female admitted secondary to ileus and SOB likely from CHF exacerbation. PMH includes recent fall with R rib fx, DM, a fib, HTN, CHF, and CKD.   Clinical Impression  Pt admitted secondary to problem above with deficits below. Pt requiring min to min guard A for mobility using RW. Mild unsteadiness noted, however, no overt LOB. Pt recently d/c'd home with daughter and HHPT. Reports plan is to return home and resume HHPT. Will continue to follow acutely to maximize functional mobility independence and safety.     Follow Up Recommendations Supervision/Assistance - 24 hour;Home health PT    Equipment Recommendations  None recommended by PT    Recommendations for Other Services       Precautions / Restrictions Precautions Precautions: Fall Restrictions Weight Bearing Restrictions: No      Mobility  Bed Mobility Overal bed mobility: Needs Assistance Bed Mobility: Supine to Sit;Sit to Supine     Supine to sit: Min assist Sit to supine: Min assist   General bed mobility comments: Min A for trunk assist and LE assist. Cues for sequencing.   Transfers Overall transfer level: Needs assistance Equipment used: Rolling walker (2 wheeled) Transfers: Sit to/from Stand Sit to Stand: Min assist         General transfer comment: Min A for steadying assist. Cues for hand placement.   Ambulation/Gait Ambulation/Gait assistance: Min guard Gait Distance (Feet): 25 Feet Assistive device: Rolling walker (2 wheeled) Gait Pattern/deviations: Step-through pattern;Decreased stride length Gait velocity: Decreased   General Gait Details: Min guard for safety. Distance limited to room as pt's food arrived and pt wanting to eat. Mild unsteadiness noted, but no overt LOB.   Stairs            Wheelchair Mobility    Modified  Rankin (Stroke Patients Only)       Balance Overall balance assessment: Needs assistance Sitting-balance support: No upper extremity supported;Feet supported Sitting balance-Leahy Scale: Good     Standing balance support: Bilateral upper extremity supported;During functional activity Standing balance-Leahy Scale: Poor Standing balance comment: Reliant on BUE support                              Pertinent Vitals/Pain Pain Assessment: Faces Faces Pain Scale: Hurts a little bit Pain Location: right lower ribcage Pain Descriptors / Indicators: Discomfort;Grimacing Pain Intervention(s): Limited activity within patient's tolerance;Monitored during session;Repositioned    Home Living Family/patient expects to be discharged to:: Private residence Living Arrangements: Children Available Help at Discharge: Family;Available PRN/intermittently Type of Home: House Home Access: Stairs to enter Entrance Stairs-Rails: Can reach both Entrance Stairs-Number of Steps: 3 Home Layout: One level Home Equipment: Walker - 4 wheels;Cane - single point;Shower seat - built in      Prior Function Level of Independence: Independent with assistive device(s)         Comments: Pt has been using rollator for ambulation.      Hand Dominance   Dominant Hand: Right    Extremity/Trunk Assessment   Upper Extremity Assessment Upper Extremity Assessment: Defer to OT evaluation    Lower Extremity Assessment Lower Extremity Assessment: Generalized weakness    Cervical / Trunk Assessment Cervical / Trunk Assessment: Normal  Communication   Communication: HOH  Cognition Arousal/Alertness: Awake/alert Behavior During Therapy: WFL for tasks  assessed/performed Overall Cognitive Status: No family/caregiver present to determine baseline cognitive functioning                                 General Comments: Pt with slowed processing noted.       General Comments       Exercises     Assessment/Plan    PT Assessment Patient needs continued PT services  PT Problem List Decreased strength;Decreased mobility;Decreased safety awareness;Decreased knowledge of precautions;Decreased coordination;Decreased activity tolerance;Decreased cognition;Cardiopulmonary status limiting activity;Decreased balance;Decreased knowledge of use of DME;Pain       PT Treatment Interventions DME instruction;Therapeutic activities;Gait training;Therapeutic exercise;Patient/family education;Balance training;Functional mobility training;Stair training    PT Goals (Current goals can be found in the Care Plan section)  Acute Rehab PT Goals Patient Stated Goal: to go home PT Goal Formulation: With patient/family Time For Goal Achievement: 03/18/20 Potential to Achieve Goals: Good    Frequency Min 3X/week   Barriers to discharge        Co-evaluation               AM-PAC PT "6 Clicks" Mobility  Outcome Measure Help needed turning from your back to your side while in a flat bed without using bedrails?: A Little Help needed moving from lying on your back to sitting on the side of a flat bed without using bedrails?: A Little Help needed moving to and from a bed to a chair (including a wheelchair)?: A Little Help needed standing up from a chair using your arms (e.g., wheelchair or bedside chair)?: A Little Help needed to walk in hospital room?: A Little Help needed climbing 3-5 steps with a railing? : A Lot 6 Click Score: 17    End of Session Equipment Utilized During Treatment: Gait belt Activity Tolerance: Patient tolerated treatment well Patient left: in bed;with call bell/phone within reach;with bed alarm set Nurse Communication: Mobility status PT Visit Diagnosis: Muscle weakness (generalized) (M62.81);History of falling (Z91.81)    Time: 9798-9211 PT Time Calculation (min) (ACUTE ONLY): 18 min   Charges:   PT Evaluation $PT Eval Low Complexity: 1 Low           Shannon Lucas, DPT  Acute Rehabilitation Services  Pager: [phone] Office: [phone]   Rudean Hitt 03/04/2020, 12:59 PM

## 2020-03-04 NOTE — Plan of Care (Signed)

## 2020-03-04 NOTE — Progress Notes (Signed)
To the best of my knowledge, the student's charting is accurate.  

## 2020-03-04 NOTE — Progress Notes (Signed)
PROGRESS NOTE    Shannon Lucas  ONG:[id number] DOB: 19-Oct-1931 DOA: 03/03/2020 PCP: Manfred Shirts, PA     Brief Narrative:  Shannon Lucas is an 86 y.o. female with medical history significant of chronic systolic congestive heart failure, paroxysmal A. Fib-on Xarelto, type 2 diabetes mellitus, hypertension, hyperlipidemia, GERD, tachybradycardia syndrome, CKD stage III, who presents to emergency department due to shortness of breath. In the ED, daughter mentioned that patient started having shortness of breath associated with orthopnea, PND, nausea and dizziness. She was recently hospitalized due to acute hypoxemic respiratory failure secondary to multifactorial including probable pneumonia, CHF exacerbation and sepsis due to presumed UTI.  She completed 5 days of Rocephin and azithromycin.  Her Entresto and Aldactone was discontinued on previous admission. In the ED, CXR revealed bilateral interstitial infiltrates, likely interstitial pulmonary edema. AXR revealed mild ileus, moderate stool burden, and acute right 10th rib fracture.  She was given IV Lasix and admitted to the hospitalist service.  New events last 24 hours / Subjective: She is lethargic but easily arousable to voice.  Denies any complaints, states that she had a bowel movement yesterday but none today.  Denies any nausea or vomiting.  Assessment & Plan:   Principal Problem:   Chronic systolic heart failure (HCC) Active Problems:   Shortness of breath   Macrocytic anemia   Diabetes mellitus type II, controlled (HCC)   Paroxysmal atrial fibrillation (HCC)   GERD (gastroesophageal reflux disease)   Essential hypertension   Ileus (HCC)   Elevated troponin    Acute on chronic systolic CHF -Echocardiogram 12/2018 with EF 60 to 65%, previous hospitalization patient was discharged on Lasix 20 mg daily, metoprolol 100 mg daily, hydralazine 100 mg 3 times daily, Imdur 30 milligrams daily.  Entresto and Aldactone discontinued -BNP  804 -CXR with bilateral interstitial infiltrates, likely interstitial pulmonary edema  -Continue IV Lasix, strict I's and O's, daily weight  Mild ileus -Had a bowel movement yesterday, encourage ambulation, continue to monitor, advance to clear liquid diet today  Essential hypertension -Continue hydralazine, metoprolol  Hyperlipidemia -Continue pravachol   Recent fall with right-sided rib fracture -Encourage incentive spirometry -Pain control -PT OT   CKD stage IV  -Baseline Cr 2 -Stable   Paroxysmal atrial fibrillation with tachybradycardia syndrome status post dual chamber pacemaker -Continue amiodarone, metoprolol, Xarelto  Diabetes mellitus, well controlled -Continue Lantus, sliding scale insulin  Hypothyroidism -Continue Synthroid  Depression -Continue effexor    DVT prophylaxis: Xarelto Code Status: Full code Family Communication: No family at bedside Disposition Plan:   Status is: Inpatient  Remains inpatient appropriate because:IV treatments appropriate due to intensity of illness or inability to take PO   Dispo: The patient is from: Home              Anticipated d/c is to: Home              Anticipated d/c date is: 2 days              Patient currently is not medically stable to d/c.    Consultants:   None  Procedures:   None  Antimicrobials:  Anti-infectives (From admission, onward)   None        Objective: Vitals:   03/03/20 2028 03/03/20 2354 03/04/20 0426 03/04/20 0811  BP: (!) 146/70 (!) 121/57 (!) 155/70 138/72  Pulse: 63 62 62 64  Resp: 20 20 20 18   Temp: 98 F (36.7 C) 97.8 F (36.6 C) 98.7 F (37.1 C) 98  F (36.7 C)  TempSrc: Oral Oral  Oral  SpO2: 100% 98% 97% 97%  Weight: 99.7 kg  99.9 kg   Height:        Intake/Output Summary (Last 24 hours) at 03/04/2020 0957 Last data filed at 03/04/2020 0800 Gross per 24 hour  Intake 240 ml  Output --  Net 240 ml   Filed Weights   03/03/20 0716 03/03/20 2028 03/04/20  0426  Weight: 101 kg 99.7 kg 99.9 kg    Examination:  General exam: Appears calm and comfortable  Respiratory system: Clear to auscultation. Respiratory effort normal. No respiratory distress. No conversational dyspnea. On [place] O2  Cardiovascular system: S1 & S2 heard, RRR (paced rhythm on tele). No murmurs. No pedal edema. Gastrointestinal system: Abdomen is nondistended, soft and nontender. Normal bowel sounds heard. Central nervous system: Alert to voice, lethargic  Extremities: Symmetric in appearance  Skin: No rashes, lesions or ulcers on exposed skin   Data Reviewed: I have personally reviewed following labs and imaging studies  CBC: Recent Labs  Lab 03/03/20 0718 03/04/20 0532  WBC 10.9* 9.4  HGB 11.0* 10.1*  HCT 34.6* 31.5*  MCV 101.2* 99.7  PLT 232 297   Basic Metabolic Panel: Recent Labs  Lab 02/27/20 0349 03/03/20 0718 03/03/20 1558 03/04/20 0532  NA 137 138  --  139  K 4.2 5.0  --  4.5  CL 104 104  --  103  CO2 23 24  --  30  GLUCOSE 152* 225*  --  178*  BUN 34* 25*  --  25*  CREATININE 2.01* 1.74*  --  1.69*  CALCIUM 9.1 9.5  --  9.3  MG  --   --  2.1  --   PHOS  --   --  2.9  --    GFR: Estimated Creatinine Clearance: 28 mL/min (A) (by C-G formula based on SCr of 1.69 mg/dL (H)). Liver Function Tests: Recent Labs  Lab 02/27/20 0349 03/04/20 0532  AST 26 18  ALT 57* 27  ALKPHOS 149* 161*  BILITOT 0.8 0.9  PROT 5.3* 5.9*  ALBUMIN 2.7* 3.0*   No results for input(s): LIPASE, AMYLASE in the last 168 hours. No results for input(s): AMMONIA in the last 168 hours. Coagulation Profile: No results for input(s): INR, PROTIME in the last 168 hours. Cardiac Enzymes: No results for input(s): CKTOTAL, CKMB, CKMBINDEX, TROPONINI in the last 168 hours. BNP (last 3 results) No results for input(s): PROBNP in the last 8760 hours. HbA1C: No results for input(s): HGBA1C in the last 72 hours. CBG: Recent Labs  Lab 03/03/20 1914 03/03/20 2110  03/03/20 2353 03/04/20 0424 03/04/20 0751  GLUCAP 173* 178* 155* 163* 165*   Lipid Profile: No results for input(s): CHOL, HDL, LDLCALC, TRIG, CHOLHDL, LDLDIRECT in the last 72 hours. Thyroid Function Tests: Recent Labs    03/03/20 1558  TSH 4.489   Anemia Panel: Recent Labs    03/03/20 1408  VITAMINB12 527   Sepsis Labs: Recent Labs  Lab 03/03/20 1555 03/03/20 2017  LATICACIDVEN 1.7 1.3    Recent Results (from the past 240 hour(s))  Respiratory Panel by RT PCR (Flu A&B, Covid) - Nasopharyngeal Swab     Status: None   Collection Time: 03/03/20 10:00 AM   Specimen: Nasopharyngeal Swab  Result Value Ref Range Status   SARS Coronavirus 2 by RT PCR NEGATIVE NEGATIVE Final    Comment: (NOTE) SARS-CoV-2 target nucleic acids are NOT DETECTED. The SARS-CoV-2 RNA is generally detectable  in upper respiratoy specimens during the acute phase of infection. The lowest concentration of SARS-CoV-2 viral copies this assay can detect is 131 copies/mL. A negative result does not preclude SARS-Cov-2 infection and should not be used as the sole basis for treatment or other patient management decisions. A negative result may occur with  improper specimen collection/handling, submission of specimen other than nasopharyngeal swab, presence of viral mutation(s) within the areas targeted by this assay, and inadequate number of viral copies (<131 copies/mL). A negative result must be combined with clinical observations, patient history, and epidemiological information. The expected result is Negative. Fact Sheet for Patients:  PinkCheek.be Fact Sheet for Healthcare Providers:  GravelBags.it This test is not yet ap proved or cleared by the [place] FDA and  has been authorized for detection and/or diagnosis of SARS-CoV-2 by FDA under an Emergency Use Authorization (EUA). This EUA will remain  in effect (meaning this test can be  used) for the duration of the COVID-19 declaration under Section 564(b)(1) of the Act, 21 U.S.C. section 360bbb-3(b)(1), unless the authorization is terminated or revoked sooner.    Influenza A by PCR NEGATIVE NEGATIVE Final   Influenza B by PCR NEGATIVE NEGATIVE Final    Comment: (NOTE) The Xpert Xpress SARS-CoV-2/FLU/RSV assay is intended as an aid in  the diagnosis of influenza from Nasopharyngeal swab specimens and  should not be used as a sole basis for treatment. Nasal washings and  aspirates are unacceptable for Xpert Xpress SARS-CoV-2/FLU/RSV  testing. Fact Sheet for Patients: PinkCheek.be Fact Sheet for Healthcare Providers: GravelBags.it This test is not yet approved or cleared by the [place] FDA and  has been authorized for detection and/or diagnosis of SARS-CoV-2 by  FDA under an Emergency Use Authorization (EUA). This EUA will remain  in effect (meaning this test can be used) for the duration of the  Covid-19 declaration under Section 564(b)(1) of the Act, 21  U.S.C. section 360bbb-3(b)(1), unless the authorization is  terminated or revoked. Performed at [facility], [place] [address], [place], [place] [address]       Radiology Studies: DG Chest 2 View  Result Date: 03/03/2020 CLINICAL DATA:  Shortness of breath. Chest tightness. Recent right rib fractures. EXAM: CHEST - 2 VIEW COMPARISON:  02/21/2020 and 02/22/2020 FINDINGS: The interstitial infiltrates noted on the prior study have slightly improved, possibly representing pulmonary edema. Heart size and pulmonary vascularity are normal. Pacemaker in place. No visible acute bone abnormality. The recently diagnosed right rib fractures are not visible on this exam. Aortic atherosclerosis. IMPRESSION: Slight improvement in the bilateral interstitial infiltrates, likely interstitial pulmonary edema. No other change. Aortic Atherosclerosis (ICD10-I70.0).  Electronically Signed   By: Lorriane Shire M.D.   On: 03/03/2020 08:15   DG Abd 1 View  Result Date: 03/03/2020 CLINICAL DATA:  No appetite, constipation EXAM: ABDOMEN - 1 VIEW COMPARISON:  CT abdomen and pelvis December 04, 2018 FINDINGS: Bowel gas pattern with stool throughout the proximal colon. There are scattered loops of gas-filled small bowel in the left hemiabdomen. Gas is present in the rectum and descending colon. Perhaps small basilar effusions. Signs of cholecystectomy. No abnormal calcifications though stool and gas projects over the renal contours. Right lateral tenth rib fracture with mild displacement. Has an appearance of an acute fracture. IMPRESSION: 1. Findings may represent mild ileus. No signs of obstruction. 2. Moderate stool burden in the proximal colon. 3. Acute right tenth rib fracture laterally. 4. Probable small basilar pleural effusions. Electronically Signed  By: Zetta Bills M.D.   On: 03/03/2020 12:59      Scheduled Meds: . amiodarone  200 mg Oral Daily  . ferrous sulfate  325 mg Oral BID WC  . furosemide  40 mg Intravenous Daily  . hydrALAZINE  100 mg Oral Q8H  . insulin aspart  0-15 Units Subcutaneous TID WC  . insulin aspart  0-5 Units Subcutaneous QHS  . insulin glargine  20 Units Subcutaneous Daily  . isosorbide mononitrate  30 mg Oral Daily  . levothyroxine  50 mcg Oral Daily  . metoprolol succinate  100 mg Oral Daily  . pravastatin  40 mg Oral QPM  . Rivaroxaban  15 mg Oral Q supper  . venlafaxine  75 mg Oral Daily   Continuous Infusions:   LOS: 1 day      Time spent: 35 minutes   Dessa Phi, DO [facility] 03/04/2020, 9:57 AM   Available via Epic secure chat 7am-7pm After these hours, please refer to coverage provider listed on [email or web address]

## 2020-03-05 LAB — FOLATE RBC
Folate, Hemolysate: 314 ng/mL
Folate, RBC: 1023 ng/mL (ref >498)
Hematocrit: 30.7 % — ABNORMAL LOW (ref 34.0–46.6)

## 2020-03-05 LAB — BASIC METABOLIC PANEL
Anion gap: 9 (ref 5–15)
BUN: 25 mg/dL — ABNORMAL HIGH (ref 8–23)
CO2: 27 mmol/L (ref 22–32)
Calcium: 9.1 mg/dL (ref 8.9–10.3)
Chloride: 100 mmol/L (ref 98–111)
Creatinine, Ser: 1.77 mg/dL — ABNORMAL HIGH (ref 0.44–1.00)
GFR calc Af Amer: 30 mL/min — ABNORMAL LOW (ref >60)
GFR calc non Af Amer: 26 mL/min — ABNORMAL LOW (ref >60)
Glucose, Bld: 145 mg/dL — ABNORMAL HIGH (ref 70–99)
Potassium: 4.1 mmol/L (ref 3.5–5.1)
Sodium: 136 mmol/L (ref 135–145)

## 2020-03-05 LAB — GLUCOSE, CAPILLARY
Glucose-Capillary: 142 mg/dL — ABNORMAL HIGH (ref 70–99)
Glucose-Capillary: 167 mg/dL — ABNORMAL HIGH (ref 70–99)
Glucose-Capillary: 139 mg/dL — ABNORMAL HIGH (ref 70–99)
Glucose-Capillary: 239 mg/dL — ABNORMAL HIGH (ref 70–99)

## 2020-03-05 MED ORDER — SENNOSIDES-DOCUSATE SODIUM 8.6-50 MG PO TABS
1.0000 | ORAL_TABLET | Freq: Two times a day (BID) | ORAL | Status: DC | PRN
  Filled 2020-03-05: qty 1

## 2020-03-05 NOTE — Progress Notes (Signed)
Physical Therapy Treatment Patient Details Name: Shannon Lucas MRN: [id number] DOB: [birth year] Today's Date: 03/05/2020    History of Present Illness Pt is an 86 y/o female admitted secondary to ileus and SOB likely from CHF exacerbation. PMH includes recent fall with R rib fx, DM, a fib, HTN, CHF, and CKD.     PT Comments    Pt progressing towards goals. Tolerated increased gait distance this session. Mild unsteadiness noted requiring min to min guard A for RW. Current recommendations appropriate. Will continue to follow acutely to maximize functional mobility independence and safety.     Follow Up Recommendations  Supervision/Assistance - 24 hour;Home health PT     Equipment Recommendations  None recommended by PT    Recommendations for Other Services       Precautions / Restrictions Precautions Precautions: Fall Restrictions Weight Bearing Restrictions: No    Mobility  Bed Mobility Overal bed mobility: Needs Assistance Bed Mobility: Supine to Sit     Supine to sit: Min assist     General bed mobility comments: Min A for trunk elevation.   Transfers Overall transfer level: Needs assistance Equipment used: Rolling walker (2 wheeled) Transfers: Sit to/from Stand Sit to Stand: Min guard         General transfer comment: Min guard for safety. Demonstrated safe hand placement.   Ambulation/Gait Ambulation/Gait assistance: Min guard;Min assist Gait Distance (Feet): 75 Feet Assistive device: Rolling walker (2 wheeled) Gait Pattern/deviations: Step-through pattern;Decreased stride length;Drifts right/left Gait velocity: Decerased   General Gait Details: Mildly unsteady gait, especially when turning. Min to min guard A for steadying.    Stairs             Wheelchair Mobility    Modified Rankin (Stroke Patients Only)       Balance Overall balance assessment: Needs assistance Sitting-balance support: No upper extremity supported;Feet  supported Sitting balance-Leahy Scale: Good     Standing balance support: Bilateral upper extremity supported;During functional activity Standing balance-Leahy Scale: Poor Standing balance comment: reliant on BUE support                             Cognition Arousal/Alertness: Awake/alert Behavior During Therapy: WFL for tasks assessed/performed Overall Cognitive Status: No family/caregiver present to determine baseline cognitive functioning                                 General Comments: Pt with slowed processing noted.       Exercises      General Comments        Pertinent Vitals/Pain Pain Assessment: No/denies pain    Home Living                      Prior Function            PT Goals (current goals can now be found in the care plan section) Acute Rehab PT Goals Patient Stated Goal: to go home PT Goal Formulation: With patient Time For Goal Achievement: 03/18/20 Potential to Achieve Goals: Good Progress towards PT goals: Progressing toward goals    Frequency    Min 3X/week      PT Plan Current plan remains appropriate    Co-evaluation              AM-PAC PT "6 Clicks" Mobility   Outcome Measure  Help needed turning from your  back to your side while in a flat bed without using bedrails?: A Little Help needed moving from lying on your back to sitting on the side of a flat bed without using bedrails?: A Little Help needed moving to and from a bed to a chair (including a wheelchair)?: A Little Help needed standing up from a chair using your arms (e.g., wheelchair or bedside chair)?: A Little Help needed to walk in hospital room?: A Little Help needed climbing 3-5 steps with a railing? : A Lot 6 Click Score: 17    End of Session Equipment Utilized During Treatment: Gait belt Activity Tolerance: Patient tolerated treatment well Patient left: in chair;with call bell/phone within reach;with chair alarm set Nurse  Communication: Mobility status PT Visit Diagnosis: Muscle weakness (generalized) (M62.81);History of falling (Z91.81)     Time: 4166-0630 PT Time Calculation (min) (ACUTE ONLY): 25 min  Charges:  $Gait Training: 23-37 mins                     Shannon Lucas, PT, DPT  Acute Rehabilitation Services  Pager: [phone] Office: [phone]    Shannon Lucas 03/05/2020, 4:03 PM

## 2020-03-05 NOTE — Plan of Care (Signed)

## 2020-03-05 NOTE — Progress Notes (Signed)
Spoke to patient's daughter at the bedside. Updated daughter with the current plan of care and answered concerns about patient's mental status.

## 2020-03-05 NOTE — Progress Notes (Signed)
PROGRESS NOTE    Shannon Lucas  ALP:[id number] DOB: January 08, 1931 DOA: 03/03/2020 PCP: Manfred Shirts, PA     Brief Narrative:  Shannon Lucas is an 86 y.o. female with medical history significant of chronic systolic congestive heart failure, paroxysmal A. Fib-on Xarelto, type 2 diabetes mellitus, hypertension, hyperlipidemia, GERD, tachybradycardia syndrome, CKD stage III, who presents to emergency department due to shortness of breath. In the ED, daughter mentioned that patient started having shortness of breath associated with orthopnea, PND, nausea and dizziness. She was recently hospitalized due to acute hypoxemic respiratory failure secondary to multifactorial including probable pneumonia, CHF exacerbation and sepsis due to presumed UTI.  She completed 5 days of Rocephin and azithromycin.  Her Entresto and Aldactone was discontinued on previous admission. In the ED, CXR revealed bilateral interstitial infiltrates, likely interstitial pulmonary edema. AXR revealed mild ileus, moderate stool burden, and acute right 10th rib fracture.  She was given IV Lasix and admitted to the hospitalist service.  New events last 24 hours / Subjective: Lethargic and sleepy this morning, but easily arousable to voice. She is alert and oriented and follows commands appropriately, but quick to fall asleep. Per RN, patient has been sleepy on and off yesterday as well, but was fully conversant at times. No BM reported. Diuresed 1.5L last 24 hours.   Assessment & Plan:   Principal Problem:   Chronic systolic heart failure (HCC) Active Problems:   Shortness of breath   Macrocytic anemia   Diabetes mellitus type II, controlled (HCC)   Paroxysmal atrial fibrillation (HCC)   GERD (gastroesophageal reflux disease)   Essential hypertension   Ileus (HCC)   Elevated troponin    Acute on chronic systolic CHF -Echocardiogram 12/2018 with EF 60 to 65%, previous hospitalization patient was discharged on Lasix 20 mg daily,  metoprolol 100 mg daily, hydralazine 100 mg 3 times daily, Imdur 30 milligrams daily.  Entresto and Aldactone discontinued -BNP 804 -CXR with bilateral interstitial infiltrates, likely interstitial pulmonary edema  -Continue IV Lasix, strict I's and O's, daily weight  Mild ileus -Encourage ambulation, continue to monitor, continue clear liquid diet today, advance diet to full liquid if BM today  -Repeat KUB in AM   Essential hypertension -Continue hydralazine, metoprolol  Hyperlipidemia -Continue pravachol   Recent fall with right-sided rib fracture -Encourage incentive spirometry -Pain control -PT OT   CKD stage IV  -Baseline Cr 2 -Stable   Paroxysmal atrial fibrillation with tachybradycardia syndrome status post dual chamber pacemaker -Continue amiodarone, metoprolol, Xarelto  Diabetes mellitus, well controlled -Continue Lantus, sliding scale insulin  Hypothyroidism -Continue Synthroid  Depression -Continue effexor    DVT prophylaxis: Xarelto Code Status: Full code Family Communication: No family at bedside; called daughter but no answer and could not leave VM  Disposition Plan:   Status is: Inpatient  Remains inpatient appropriate because:IV treatments appropriate due to intensity of illness or inability to take PO   Dispo: The patient is from: Home              Anticipated d/c is to: Home              Anticipated d/c date is: 2 days              Patient currently is not medically stable to d/c.    Consultants:   None  Procedures:   None  Antimicrobials:  Anti-infectives (From admission, onward)   None       Objective: Vitals:   03/05/20 0555 03/05/20 0800  03/05/20 0818 03/05/20 1143  BP: 127/63 (!) 126/57 (!) 126/57 (!) 122/58  Pulse: 64 63 63 61  Resp:  16  18  Temp:  98.3 F (36.8 C)  98.1 F (36.7 C)  TempSrc:  Oral  Oral  SpO2:  98%  99%  Weight:      Height:        Intake/Output Summary (Last 24 hours) at 03/05/2020  1153 Last data filed at 03/05/2020 0856 Gross per 24 hour  Intake 940 ml  Output 1250 ml  Net -310 ml   Filed Weights   03/03/20 2028 03/04/20 0426 03/05/20 0328  Weight: 99.7 kg 99.9 kg 99.1 kg    Examination: General exam: Appears calm and comfortable  Respiratory system: Clear to auscultation. Respiratory effort normal.  Cardiovascular system: S1 & S2 heard, RRR, paced. No pedal edema. Gastrointestinal system: Abdomen is nondistended, soft and nontender.  Central nervous system: Alert to voice and oriented x3. Non focal exam. Speech clear  Extremities: Symmetric in appearance bilaterally  Skin: No rashes, lesions or ulcers on exposed skin  Psychiatry: Judgement and insight appear stable. Mood & affect appropriate.    Data Reviewed: I have personally reviewed following labs and imaging studies  CBC: Recent Labs  Lab 03/03/20 0718 03/04/20 0532  WBC 10.9* 9.4  HGB 11.0* 10.1*  HCT 34.6* 31.5*  MCV 101.2* 99.7  PLT 232 322   Basic Metabolic Panel: Recent Labs  Lab 03/03/20 0718 03/03/20 1558 03/04/20 0532 03/05/20 0621  NA 138  --  139 136  K 5.0  --  4.5 4.1  CL 104  --  103 100  CO2 24  --  30 27  GLUCOSE 225*  --  178* 145*  BUN 25*  --  25* 25*  CREATININE 1.74*  --  1.69* 1.77*  CALCIUM 9.5  --  9.3 9.1  MG  --  2.1  --   --   PHOS  --  2.9  --   --    GFR: Estimated Creatinine Clearance: 26.6 mL/min (A) (by C-G formula based on SCr of 1.77 mg/dL (H)). Liver Function Tests: Recent Labs  Lab 03/04/20 0532  AST 18  ALT 27  ALKPHOS 161*  BILITOT 0.9  PROT 5.9*  ALBUMIN 3.0*   No results for input(s): LIPASE, AMYLASE in the last 168 hours. No results for input(s): AMMONIA in the last 168 hours. Coagulation Profile: No results for input(s): INR, PROTIME in the last 168 hours. Cardiac Enzymes: No results for input(s): CKTOTAL, CKMB, CKMBINDEX, TROPONINI in the last 168 hours. BNP (last 3 results) No results for input(s): PROBNP in the last 8760  hours. HbA1C: No results for input(s): HGBA1C in the last 72 hours. CBG: Recent Labs  Lab 03/04/20 0751 03/04/20 1140 03/04/20 1656 03/04/20 2055 03/05/20 0551  GLUCAP 165* 232* 185* 163* 142*   Lipid Profile: No results for input(s): CHOL, HDL, LDLCALC, TRIG, CHOLHDL, LDLDIRECT in the last 72 hours. Thyroid Function Tests: Recent Labs    03/03/20 1558  TSH 4.489   Anemia Panel: Recent Labs    03/03/20 1408  VITAMINB12 527   Sepsis Labs: Recent Labs  Lab 03/03/20 1555 03/03/20 2017  LATICACIDVEN 1.7 1.3    Recent Results (from the past 240 hour(s))  Respiratory Panel by RT PCR (Flu A&B, Covid) - Nasopharyngeal Swab     Status: None   Collection Time: 03/03/20 10:00 AM   Specimen: Nasopharyngeal Swab  Result Value Ref Range Status  SARS Coronavirus 2 by RT PCR NEGATIVE NEGATIVE Final    Comment: (NOTE) SARS-CoV-2 target nucleic acids are NOT DETECTED. The SARS-CoV-2 RNA is generally detectable in upper respiratoy specimens during the acute phase of infection. The lowest concentration of SARS-CoV-2 viral copies this assay can detect is 131 copies/mL. A negative result does not preclude SARS-Cov-2 infection and should not be used as the sole basis for treatment or other patient management decisions. A negative result may occur with  improper specimen collection/handling, submission of specimen other than nasopharyngeal swab, presence of viral mutation(s) within the areas targeted by this assay, and inadequate number of viral copies (<131 copies/mL). A negative result must be combined with clinical observations, patient history, and epidemiological information. The expected result is Negative. Fact Sheet for Patients:  PinkCheek.be Fact Sheet for Healthcare Providers:  GravelBags.it This test is not yet ap proved or cleared by the [place] FDA and  has been authorized for detection and/or diagnosis  of SARS-CoV-2 by FDA under an Emergency Use Authorization (EUA). This EUA will remain  in effect (meaning this test can be used) for the duration of the COVID-19 declaration under Section 564(b)(1) of the Act, 21 U.S.C. section 360bbb-3(b)(1), unless the authorization is terminated or revoked sooner.    Influenza A by PCR NEGATIVE NEGATIVE Final   Influenza B by PCR NEGATIVE NEGATIVE Final    Comment: (NOTE) The Xpert Xpress SARS-CoV-2/FLU/RSV assay is intended as an aid in  the diagnosis of influenza from Nasopharyngeal swab specimens and  should not be used as a sole basis for treatment. Nasal washings and  aspirates are unacceptable for Xpert Xpress SARS-CoV-2/FLU/RSV  testing. Fact Sheet for Patients: PinkCheek.be Fact Sheet for Healthcare Providers: GravelBags.it This test is not yet approved or cleared by the [place] FDA and  has been authorized for detection and/or diagnosis of SARS-CoV-2 by  FDA under an Emergency Use Authorization (EUA). This EUA will remain  in effect (meaning this test can be used) for the duration of the  Covid-19 declaration under Section 564(b)(1) of the Act, 21  U.S.C. section 360bbb-3(b)(1), unless the authorization is  terminated or revoked. Performed at [facility], [place] [address] [place], [place] [address]       Radiology Studies: DG Abd 1 View  Result Date: 03/03/2020 CLINICAL DATA:  No appetite, constipation EXAM: ABDOMEN - 1 VIEW COMPARISON:  CT abdomen and pelvis December 04, 2018 FINDINGS: Bowel gas pattern with stool throughout the proximal colon. There are scattered loops of gas-filled small bowel in the left hemiabdomen. Gas is present in the rectum and descending colon. Perhaps small basilar effusions. Signs of cholecystectomy. No abnormal calcifications though stool and gas projects over the renal contours. Right lateral tenth rib fracture with mild displacement. Has  an appearance of an acute fracture. IMPRESSION: 1. Findings may represent mild ileus. No signs of obstruction. 2. Moderate stool burden in the proximal colon. 3. Acute right tenth rib fracture laterally. 4. Probable small basilar pleural effusions. Electronically Signed   By: Zetta Bills M.D.   On: 03/03/2020 12:59      Scheduled Meds: . amiodarone  200 mg Oral Daily  . ferrous sulfate  325 mg Oral BID WC  . furosemide  40 mg Intravenous Daily  . hydrALAZINE  100 mg Oral Q8H  . insulin aspart  0-15 Units Subcutaneous TID WC  . insulin aspart  0-5 Units Subcutaneous QHS  . insulin glargine  20 Units Subcutaneous Daily  . isosorbide  mononitrate  30 mg Oral Daily  . levothyroxine  50 mcg Oral Daily  . metoprolol succinate  100 mg Oral Daily  . pravastatin  40 mg Oral QPM  . Rivaroxaban  15 mg Oral Q supper  . venlafaxine  75 mg Oral Daily   Continuous Infusions:   LOS: 2 days      Time spent: 25 minutes   Dessa Phi, DO [facility] 03/05/2020, 11:53 AM   Available via Epic secure chat 7am-7pm After these hours, please refer to coverage provider listed on [email or web address]

## 2020-03-05 NOTE — Evaluation (Signed)
Occupational Therapy Evaluation Patient Details Name: Shannon Lucas MRN: [id number] DOB: December 09, 1930 Today's Date: 03/05/2020    History of Present Illness Pt is an 86 y/o female admitted secondary to ileus and SOB likely from CHF exacerbation. PMH includes recent fall with R rib fx, DM, a fib, HTN, CHF, and CKD.    Clinical Impression   Pt admitted with above diagnoses, was recently admitted last week. At time of eval, daughter present and supportive. Noted cognitive deficits in patient with STM, awareness, and problem solving. Pt was able to complete sit <> stands and functional mobility at min guard level with RW. Pt completed toilet transfer and anterior peri care. She then stood at the sink for grooming tasks. X2 standing rest breaks taken. Pt then completed "sink push ups" x10 and overhead reaching activity x10 to challenge BUE strength and dynamic balance. SpO2 stable on RA with activity. Recommend HHOT at d/c for safe progression of BADL in home environment. Will continue to follow per POC listed below.     Follow Up Recommendations  Home health OT;Supervision/Assistance - 24 hour    Equipment Recommendations  3 in 1 bedside commode    Recommendations for Other Services       Precautions / Restrictions Precautions Precautions: Fall Restrictions Weight Bearing Restrictions: No      Mobility Bed Mobility Overal bed mobility: Needs Assistance Bed Mobility: Supine to Sit     Supine to sit: Min assist     General bed mobility comments: up in chair  Transfers Overall transfer level: Needs assistance Equipment used: Rolling walker (2 wheeled) Transfers: Sit to/from Stand Sit to Stand: Min guard         General transfer comment: Min guard for safety. Demonstrated safe hand placement.     Balance Overall balance assessment: Needs assistance Sitting-balance support: No upper extremity supported;Feet supported Sitting balance-Leahy Scale: Good     Standing balance  support: Bilateral upper extremity supported;During functional activity Standing balance-Leahy Scale: Poor Standing balance comment: reliant on BUE support of either walker or sink when standing for grooming tasks                           ADL either performed or assessed with clinical judgement   ADL Overall ADL's : Needs assistance/impaired Eating/Feeding: Set up;Sitting   Grooming: Min guard;Standing Grooming Details (indicate cue type and reason): standing at sink to complete grooming tasks Upper Body Bathing: Set up;Sitting   Lower Body Bathing: Minimal assistance;Sit to/from stand;Sitting/lateral leans   Upper Body Dressing : Set up;Sitting   Lower Body Dressing: Minimal assistance;Sit to/from stand;Sitting/lateral leans   Toilet Transfer: Minimal assistance;Regular Toilet;Grab bars;RW Armed forces technical officer Details (indicate cue type and reason): heavy use of grab bars to power up from low toilet Toileting- Clothing Manipulation and Hygiene: Set up;Sitting/lateral lean Toileting - Clothing Manipulation Details (indicate cue type and reason): to complete anterior peri care Tub/ Shower Transfer: Min guard;Ambulation;Shower seat;Rolling walker   Functional mobility during ADLs: Min guard;Cueing for safety;Rolling walker       Vision Patient Visual Report: No change from baseline       Perception     Praxis      Pertinent Vitals/Pain Pain Assessment: Faces Faces Pain Scale: Hurts a little bit Pain Location: L trap Pain Descriptors / Indicators: Discomfort;Grimacing Pain Intervention(s): Heat applied;Repositioned;Monitored during session     Hand Dominance Right   Extremity/Trunk Assessment Upper Extremity Assessment Upper Extremity Assessment: Generalized weakness  Lower Extremity Assessment Lower Extremity Assessment: Generalized weakness       Communication Communication Communication: HOH   Cognition Arousal/Alertness: Awake/alert Behavior  During Therapy: WFL for tasks assessed/performed Overall Cognitive Status: Impaired/Different from baseline Area of Impairment: Awareness;Problem solving;Memory                     Memory: Decreased short-term memory     Awareness: Emergent Problem Solving: Slow processing;Requires verbal cues;Requires tactile cues General Comments: decreased processing speed, needing more frequent cues and direction for basic BADL tasks   General Comments       Exercises     Shoulder Instructions      Home Living Family/patient expects to be discharged to:: Private residence Living Arrangements: Children Available Help at Discharge: Family;Available PRN/intermittently Type of Home: House Home Access: Stairs to enter [employer] of Steps: 3 Entrance Stairs-Rails: Can reach both Home Layout: One level     Bathroom Shower/Tub: Occupational psychologist: Standard     Home Equipment: Environmental consultant - 4 wheels;Cane - single point;Shower seat - built in          Prior Functioning/Environment Level of Independence: Independent with assistive device(s)        Comments: Pt has been using rollator for ambulation.         OT Problem List: Decreased strength;Decreased activity tolerance;Impaired balance (sitting and/or standing);Decreased safety awareness;Pain;Decreased knowledge of use of DME or AE;Cardiopulmonary status limiting activity      OT Treatment/Interventions: Self-care/ADL training;Therapeutic exercise;Energy conservation;DME and/or AE instruction;Therapeutic activities;Patient/family education;Balance training;Cognitive remediation/compensation;Manual therapy    OT Goals(Current goals can be found in the care plan section) Acute Rehab OT Goals Patient Stated Goal: to go home OT Goal Formulation: With patient Time For Goal Achievement: 03/19/20 Potential to Achieve Goals: Good  OT Frequency: Min 2X/week   Barriers to D/C:            Co-evaluation               AM-PAC OT "6 Clicks" Daily Activity     Outcome Measure Help from another person eating meals?: None Help from another person taking care of personal grooming?: A Little Help from another person toileting, which includes using toliet, bedpan, or urinal?: A Little Help from another person bathing (including washing, rinsing, drying)?: A Little Help from another person to put on and taking off regular upper body clothing?: None Help from another person to put on and taking off regular lower body clothing?: A Little 6 Click Score: 20   End of Session Equipment Utilized During Treatment: Gait belt;Rolling walker Nurse Communication: Mobility status  Activity Tolerance: Patient tolerated treatment well Patient left: in chair;with call bell/phone within reach;with chair alarm set;with family/visitor present  OT Visit Diagnosis: Unsteadiness on feet (R26.81);History of falling (Z91.81)                Time: 7342-8768 OT Time Calculation (min): 32 min Charges:  OT General Charges $OT Visit: 1 Visit OT Evaluation $OT Eval Moderate Complexity: 1 Mod OT Treatments $Self Care/Home Management : 8-22 mins  Zenovia Jarred, MSOT, OTR/L Acute Rehabilitation Services [facility] Office Number: [phone] Pager: [phone]  Zenovia Jarred 03/05/2020, 5:48 PM

## 2020-03-05 NOTE — Plan of Care (Signed)

## 2020-03-06 ENCOUNTER — Inpatient Hospital Stay (HOSPITAL_COMMUNITY): Payer: Medicare Other

## 2020-03-06 DIAGNOSIS — I5023 Acute on chronic systolic (congestive) heart failure: Secondary | ICD-10-CM

## 2020-03-06 LAB — BASIC METABOLIC PANEL
Anion gap: 9 (ref 5–15)
BUN: 23 mg/dL (ref 8–23)
CO2: 26 mmol/L (ref 22–32)
Calcium: 9.2 mg/dL (ref 8.9–10.3)
Chloride: 99 mmol/L (ref 98–111)
Creatinine, Ser: 1.91 mg/dL — ABNORMAL HIGH (ref 0.44–1.00)
GFR calc Af Amer: 27 mL/min — ABNORMAL LOW (ref >60)
GFR calc non Af Amer: 23 mL/min — ABNORMAL LOW (ref >60)
Glucose, Bld: 123 mg/dL — ABNORMAL HIGH (ref 70–99)
Potassium: 4.2 mmol/L (ref 3.5–5.1)
Sodium: 134 mmol/L — ABNORMAL LOW (ref 135–145)

## 2020-03-06 LAB — GLUCOSE, CAPILLARY
Glucose-Capillary: 109 mg/dL — ABNORMAL HIGH (ref 70–99)
Glucose-Capillary: 215 mg/dL — ABNORMAL HIGH (ref 70–99)
Glucose-Capillary: 162 mg/dL — ABNORMAL HIGH (ref 70–99)
Glucose-Capillary: 224 mg/dL — ABNORMAL HIGH (ref 70–99)

## 2020-03-06 NOTE — Care Management Important Message (Signed)
Important Message  Patient Details  Name: Shannon Lucas MRN: [id number] Date of Birth: 1931/08/30   Medicare Important Message Given:  Yes     Shelda Altes 03/06/2020, 8:19 AM

## 2020-03-06 NOTE — Progress Notes (Signed)
PROGRESS NOTE    Shannon Lucas  MOQ:[id number] DOB: 05/13/1931 DOA: 03/03/2020 PCP: Manfred Shirts, PA     Brief Narrative:  Shannon Lucas is an 86 y.o. female with medical history significant of chronic systolic congestive heart failure, paroxysmal A. Fib-on Xarelto, type 2 diabetes mellitus, hypertension, hyperlipidemia, GERD, tachybradycardia syndrome, CKD stage III, who presents to emergency department due to shortness of breath. In the ED, daughter mentioned that patient started having shortness of breath associated with orthopnea, PND, nausea and dizziness. She was recently hospitalized due to acute hypoxemic respiratory failure secondary to multifactorial including probable pneumonia, CHF exacerbation and sepsis due to presumed UTI.  She completed 5 days of Rocephin and azithromycin.  Her Entresto and Aldactone was discontinued on previous admission. In the ED, CXR revealed bilateral interstitial infiltrates, likely interstitial pulmonary edema. AXR revealed mild ileus, moderate stool burden, and acute right 10th rib fracture.  She was given IV Lasix and admitted to the hospitalist service.  New events last 24 hours / Subjective: Alert, awake, and conversant this morning. States she is passing gas, denies any shortness of breath, no abdominal or chest pain. Still having some orthopnea.   Assessment & Plan:   Principal Problem:   Acute on chronic systolic CHF (congestive heart failure) (HCC) Active Problems:   Shortness of breath   Macrocytic anemia   Diabetes mellitus type II, controlled (HCC)   Paroxysmal atrial fibrillation (HCC)   GERD (gastroesophageal reflux disease)   Essential hypertension   Ileus (HCC)   Elevated troponin    Acute on chronic systolic CHF -Echocardiogram 12/2018 with EF 60 to 65%, previous hospitalization patient was discharged on Lasix 20 mg daily, metoprolol 100 mg daily, hydralazine 100 mg 3 times daily, Imdur 30 milligrams daily.  Entresto and Aldactone  discontinued -BNP 804 -CXR with bilateral interstitial infiltrates, likely interstitial pulmonary edema  -Continue IV Lasix, strict I's and O's, daily weight  Mild ileus -Encourage ambulation -Passing gas and +bowel sounds. No nausea, vomiting, abdominal pain. Will advance to full liquid diet today   Essential hypertension -Continue hydralazine, metoprolol  Hyperlipidemia -Continue pravachol   Recent fall with right-sided rib fracture -Encourage incentive spirometry -Pain control -PT OT rec home health   CKD stage IV  -Baseline Cr 2 -Stable   Paroxysmal atrial fibrillation with tachybradycardia syndrome status post dual chamber pacemaker -Continue amiodarone, metoprolol, Xarelto  Diabetes mellitus, well controlled -Continue Lantus, sliding scale insulin  Hypothyroidism -Continue Synthroid  Depression -Continue effexor    DVT prophylaxis: Xarelto Code Status: Full code Family Communication: Spoke with daughter over the phone this morning   Disposition Plan:   Status is: Inpatient  Remains inpatient appropriate because:IV treatments appropriate due to intensity of illness or inability to take PO   Dispo: The patient is from: Home              Anticipated d/c is to: Home              Anticipated d/c date is: 1-2 days              Patient currently is not medically stable to d/c.    Consultants:   None  Procedures:   None  Antimicrobials:  Anti-infectives (From admission, onward)   None       Objective: Vitals:   03/06/20 0359 03/06/20 0407 03/06/20 0544 03/06/20 0901  BP: (!) 135/59  (!) 142/46 (!) 124/92  Pulse: (!) 59  61 61  Resp: 20   17  Temp: 98.3 F (36.8 C)   98 F (36.7 C)  TempSrc: Oral   Oral  SpO2: 97%   95%  Weight:  93.5 kg    Height:        Intake/Output Summary (Last 24 hours) at 03/06/2020 0957 Last data filed at 03/06/2020 0925 Gross per 24 hour  Intake 1320 ml  Output 1050 ml  Net 270 ml   Filed Weights    03/04/20 0426 03/05/20 0328 03/06/20 0407  Weight: 99.9 kg 99.1 kg 93.5 kg    Examination: General exam: Appears calm and comfortable  Respiratory system: Clear to auscultation. Respiratory effort normal. On room air without conversational dyspnea  Cardiovascular system: S1 & S2 heard, RRR, paced rhythm. No pedal edema. Gastrointestinal system: Abdomen is nondistended, soft and nontender. Normal bowel sounds heard. Central nervous system: Alert and oriented. Non focal exam. Speech clear  Extremities: Symmetric in appearance bilaterally  Skin: No rashes, lesions or ulcers on exposed skin  Psychiatry: Judgement and insight appear stable. Mood & affect appropriate.    Data Reviewed: I have personally reviewed following labs and imaging studies  CBC: Recent Labs  Lab 03/03/20 0718 03/03/20 2017 03/04/20 0532  WBC 10.9*  --  9.4  HGB 11.0*  --  10.1*  HCT 34.6* 30.7* 31.5*  MCV 101.2*  --  99.7  PLT 232  --  353   Basic Metabolic Panel: Recent Labs  Lab 03/03/20 0718 03/03/20 1558 03/04/20 0532 03/05/20 0621 03/06/20 0623  NA 138  --  139 136 134*  K 5.0  --  4.5 4.1 4.2  CL 104  --  103 100 99  CO2 24  --  30 27 26   GLUCOSE 225*  --  178* 145* 123*  BUN 25*  --  25* 25* 23  CREATININE 1.74*  --  1.69* 1.77* 1.91*  CALCIUM 9.5  --  9.3 9.1 9.2  MG  --  2.1  --   --   --   PHOS  --  2.9  --   --   --    GFR: Estimated Creatinine Clearance: 23.9 mL/min (A) (by C-G formula based on SCr of 1.91 mg/dL (H)). Liver Function Tests: Recent Labs  Lab 03/04/20 0532  AST 18  ALT 27  ALKPHOS 161*  BILITOT 0.9  PROT 5.9*  ALBUMIN 3.0*   No results for input(s): LIPASE, AMYLASE in the last 168 hours. No results for input(s): AMMONIA in the last 168 hours. Coagulation Profile: No results for input(s): INR, PROTIME in the last 168 hours. Cardiac Enzymes: No results for input(s): CKTOTAL, CKMB, CKMBINDEX, TROPONINI in the last 168 hours. BNP (last 3 results) No results  for input(s): PROBNP in the last 8760 hours. HbA1C: No results for input(s): HGBA1C in the last 72 hours. CBG: Recent Labs  Lab 03/05/20 0551 03/05/20 1144 03/05/20 1609 03/05/20 2042 03/06/20 0545  GLUCAP 142* 167* 139* 239* 109*   Lipid Profile: No results for input(s): CHOL, HDL, LDLCALC, TRIG, CHOLHDL, LDLDIRECT in the last 72 hours. Thyroid Function Tests: Recent Labs    03/03/20 1558  TSH 4.489   Anemia Panel: Recent Labs    03/03/20 1408  VITAMINB12 527   Sepsis Labs: Recent Labs  Lab 03/03/20 1555 03/03/20 2017  LATICACIDVEN 1.7 1.3    Recent Results (from the past 240 hour(s))  Respiratory Panel by RT PCR (Flu A&B, Covid) - Nasopharyngeal Swab     Status: None   Collection Time: 03/03/20 10:00 AM  Specimen: Nasopharyngeal Swab  Result Value Ref Range Status   SARS Coronavirus 2 by RT PCR NEGATIVE NEGATIVE Final    Comment: (NOTE) SARS-CoV-2 target nucleic acids are NOT DETECTED. The SARS-CoV-2 RNA is generally detectable in upper respiratoy specimens during the acute phase of infection. The lowest concentration of SARS-CoV-2 viral copies this assay can detect is 131 copies/mL. A negative result does not preclude SARS-Cov-2 infection and should not be used as the sole basis for treatment or other patient management decisions. A negative result may occur with  improper specimen collection/handling, submission of specimen other than nasopharyngeal swab, presence of viral mutation(s) within the areas targeted by this assay, and inadequate number of viral copies (<131 copies/mL). A negative result must be combined with clinical observations, patient history, and epidemiological information. The expected result is Negative. Fact Sheet for Patients:  PinkCheek.be Fact Sheet for Healthcare Providers:  GravelBags.it This test is not yet ap proved or cleared by the [place] FDA and  has been  authorized for detection and/or diagnosis of SARS-CoV-2 by FDA under an Emergency Use Authorization (EUA). This EUA will remain  in effect (meaning this test can be used) for the duration of the COVID-19 declaration under Section 564(b)(1) of the Act, 21 U.S.C. section 360bbb-3(b)(1), unless the authorization is terminated or revoked sooner.    Influenza A by PCR NEGATIVE NEGATIVE Final   Influenza B by PCR NEGATIVE NEGATIVE Final    Comment: (NOTE) The Xpert Xpress SARS-CoV-2/FLU/RSV assay is intended as an aid in  the diagnosis of influenza from Nasopharyngeal swab specimens and  should not be used as a sole basis for treatment. Nasal washings and  aspirates are unacceptable for Xpert Xpress SARS-CoV-2/FLU/RSV  testing. Fact Sheet for Patients: PinkCheek.be Fact Sheet for Healthcare Providers: GravelBags.it This test is not yet approved or cleared by the [place] FDA and  has been authorized for detection and/or diagnosis of SARS-CoV-2 by  FDA under an Emergency Use Authorization (EUA). This EUA will remain  in effect (meaning this test can be used) for the duration of the  Covid-19 declaration under Section 564(b)(1) of the Act, 21  U.S.C. section 360bbb-3(b)(1), unless the authorization is  terminated or revoked. Performed at [facility], [place] [address] [place], [place] [address]       Radiology Studies: DG Abd 1 View  Result Date: 03/06/2020 CLINICAL DATA:  86 year old female with possible ileus on abdominal radiographs 3 days ago. EXAM: ABDOMEN - 1 VIEW COMPARISON:  Radiographs 03/03/2020 and earlier. FINDINGS: Portable AP supine view at 0521 hours. Gas in small and large bowel loops, non abnormally dilated. Overall the gas pattern has not significantly changed. Stable cholecystectomy clips. Lung bases appear to remain negative. Partially visible cardiac pacemaker leads. No acute osseous abnormality  identified. IMPRESSION: Gas throughout nondilated large and small bowel loops not significantly changed since 03/03/2020. This is a nonobstructed pattern, but ileus remains possible. Electronically Signed   By: Genevie Ann M.D.   On: 03/06/2020 07:48      Scheduled Meds: . amiodarone  200 mg Oral Daily  . ferrous sulfate  325 mg Oral BID WC  . furosemide  40 mg Intravenous Daily  . hydrALAZINE  100 mg Oral Q8H  . insulin aspart  0-15 Units Subcutaneous TID WC  . insulin aspart  0-5 Units Subcutaneous QHS  . insulin glargine  20 Units Subcutaneous Daily  . isosorbide mononitrate  30 mg Oral Daily  . levothyroxine  50 mcg Oral  Daily  . metoprolol succinate  100 mg Oral Daily  . pravastatin  40 mg Oral QPM  . Rivaroxaban  15 mg Oral Q supper  . venlafaxine  75 mg Oral Daily   Continuous Infusions:   LOS: 3 days      Time spent: 25 minutes   Dessa Phi, DO [facility] 03/06/2020, 9:57 AM   Available via Epic secure chat 7am-7pm After these hours, please refer to coverage provider listed on [email or web address]

## 2020-03-06 NOTE — TOC Transition Note (Signed)
Transition of Care [facility]) - CM/SW Discharge Note   Patient Details  Name: Shannon Lucas MRN: [id number] Date of Birth: 01-09-1931  Transition of [facility]) CM/SW Contact:  Zenon Mayo, RN Phone Number: 03/06/2020, 1:29 PM   Clinical Narrative:    NCM spoke with patient at bedside, she could not remember the name of the [facility] agency she is with , she stated to call her daughter.  NCM contacted her daughter , Ginger she states patient is active with [facility], and they would like to continue with them.  NCM contacted Butch Penny with [facility] and she confirmed patient is active for [place], [place], [place], [place].  Patient is for dc tomorrow.  Soc will resume once discharge.  She has a 3 n1 at home , she has a hospital bed also.  Daughter states they have all the DME they need.  Ginger will transport her home when she is discharged.    Final next level of care: [place] Barriers to Discharge: Continued Medical Work up   Patient Goals and CMS Choice Patient states their goals for this hospitalization and ongoing recovery are:: to get well CMS Medicare.gov Compare Post Acute Care list provided to:: Patient Represenative (must comment) Choice offered to / list presented to : Adult Children  Discharge Placement                       Discharge Plan and Services                  DME Agency: NA       HH Arranged: RN, PT, OT, Nurse's Aide [place] Agency: [place] Date [place]: 03/06/20 Time [place]: [id number] Representative spoke with at [place]: [place] ([place]) Interventions     Readmission Risk Interventions Readmission Risk Prevention Plan 02/27/2020  Transportation Screening Complete  PCP or Specialist Appt within 5-7 Days Complete  Home Care Screening Complete  Medication Review (RN CM) Complete  Some recent data might be hidden

## 2020-03-07 LAB — BASIC METABOLIC PANEL
Anion gap: 10 (ref 5–15)
BUN: 22 mg/dL (ref 8–23)
CO2: 27 mmol/L (ref 22–32)
Calcium: 9 mg/dL (ref 8.9–10.3)
Chloride: 100 mmol/L (ref 98–111)
Creatinine, Ser: 1.86 mg/dL — ABNORMAL HIGH (ref 0.44–1.00)
GFR calc Af Amer: 28 mL/min — ABNORMAL LOW (ref >60)
GFR calc non Af Amer: 24 mL/min — ABNORMAL LOW (ref >60)
Glucose, Bld: 157 mg/dL — ABNORMAL HIGH (ref 70–99)
Potassium: 3.9 mmol/L (ref 3.5–5.1)
Sodium: 137 mmol/L (ref 135–145)

## 2020-03-07 LAB — GLUCOSE, CAPILLARY: Glucose-Capillary: 152 mg/dL — ABNORMAL HIGH (ref 70–99)

## 2020-03-07 MED ORDER — FUROSEMIDE 20 MG PO TABS: 40.0000 mg | ORAL_TABLET | Freq: Every day | ORAL | 1 refills | Status: DC

## 2020-03-07 NOTE — TOC Transition Note (Signed)
Transition of Care [facility]) - CM/SW Discharge Note   Patient Details  Name: Winifred Bodiford MRN: [id number] Date of Birth: 09-11-1931  [facility]) CM/SW Contact:  Bartholomew Crews, RN Phone Number: [phone] 03/07/2020, 10:29 AM   Clinical Narrative:     Patient to transition home today. [place] orders placed for PT, OT, RN, Aide on 4/22. Notified Jason at [facility] of discharge order. No further TOC needs identified at this time.   Final next level of care: [place] Barriers to Discharge: No Barriers Identified   Patient Goals and CMS Choice Patient states their goals for this hospitalization and ongoing recovery are:: to get well CMS Medicare.gov Compare Post Acute Care list provided to:: Patient Represenative (must comment) Choice offered to / list presented to : Adult Children  Discharge Placement                       Discharge Plan and Services                  DME Agency: NA       HH Arranged: RN, PT, Nurse's Aide, OT HH Agency: [place] ([facility]) Date HH Agency Contacted: 03/07/20 Time [place]: 1029 Representative spoke with at [place]: [place] ([place]) Interventions     Readmission Risk Interventions Readmission Risk Prevention Plan 02/27/2020  Transportation Screening Complete  PCP or Specialist Appt within 5-7 Days Complete  Home Care Screening Complete  Medication Review (RN CM) Complete  Some recent data might be hidden

## 2020-03-07 NOTE — Discharge Summary (Signed)
Physician Discharge Summary  Shannon Lucas XTK:[id number] DOB: 1931/09/14 DOA: 03/03/2020  PCP: Manfred Shirts, PA  Admit date: 03/03/2020 Discharge date: 03/07/2020  Admitted From: Home Disposition:  Home with home health  Recommendations for Outpatient Follow-up:  1. Follow up with PCP in 1 week 2. Follow up with Cardiology as scheduled on 5/4  Discharge Condition: Stable CODE STATUS: Full  Diet recommendation: Heart healthy   Brief/Interim Summary: Shannon Lucas is an 86 y.o.femalewith medical history significant ofchronic systolic congestive heart failure, paroxysmal A. Fib-on Xarelto, type 2 diabetes mellitus, hypertension, hyperlipidemia, GERD, tachybradycardia syndrome, CKD stage III, who presents to emergency department due to shortness of breath. In the ED, daughter mentioned that patient started having shortness of breath associated with orthopnea, PND, nausea and dizziness. She was recently hospitalized due to acute hypoxemic respiratory failure secondary to multifactorial including probable pneumonia, CHF exacerbation and sepsis due to presumed UTI. She completed 5 days of Rocephin and azithromycin. Her Entresto and Aldactone was discontinued on previous admission. In the ED, CXR revealed bilateral interstitial infiltrates, likely interstitial pulmonary edema. AXR revealed mild ileus, moderate stool burden, and acute right 10th rib fracture.  She was given IV Lasix and admitted to the hospitalist service. She was treated with supportive care for ileus, with improvement. On day of discharge, she had had BM and feeling well, tolerating diet. She remained stable on room air.   Discharge Diagnoses:  Principal Problem:   Acute on chronic systolic CHF (congestive heart failure) (HCC) Active Problems:   Shortness of breath   Macrocytic anemia   Diabetes mellitus type II, controlled (HCC)   Paroxysmal atrial fibrillation (HCC)   GERD (gastroesophageal reflux disease)   Essential  hypertension   Ileus (HCC)   Elevated troponin   Acute on chronic systolic CHF -Echocardiogram 12/2018 with EF 60 to 65%, previous hospitalization patient was discharged on Lasix 20 mg daily, metoprolol 100 mg daily, hydralazine 100 mg 3 times daily, Imdur 30 milligrams daily.  Entresto and Aldactone discontinued -BNP 804 -CXR with bilateral interstitial infiltrates, likely interstitial pulmonary edema  -Strict I's and O's, daily weight -Resume home meds, increase lasix to 40mg  daily   Mild ileus -Resolved   Essential hypertension -Continue hydralazine, metoprolol  Hyperlipidemia -Continue pravachol   Recent fall with right-sided rib fracture -Encourage incentive spirometry -Pain control -PT OT rec home health   CKD stage IV  -Baseline Cr 2 -Stable, Cr 1.86   Paroxysmal atrial fibrillation with tachybradycardia syndrome status post dual chamber pacemaker -Continue amiodarone, metoprolol, Xarelto  Diabetes mellitus, well controlled -Continue Lantus, sliding scale insulin  Hypothyroidism -Continue Synthroid  Depression -Continue effexor     Discharge Instructions  Discharge Instructions    (HEART FAILURE PATIENTS) Call MD:  Anytime you have any of the following symptoms: 1) 3 pound weight gain in 24 hours or 5 pounds in 1 week 2) shortness of breath, with or without a dry hacking cough 3) swelling in the hands, feet or stomach 4) if you have to sleep on extra pillows at night in order to breathe.   Complete by: As directed    Call MD for:  difficulty breathing, headache or visual disturbances   Complete by: As directed    Call MD for:  extreme fatigue   Complete by: As directed    Call MD for:  persistant dizziness or light-headedness   Complete by: As directed    Call MD for:  persistant nausea and vomiting   Complete by: As directed  Call MD for:  severe uncontrolled pain   Complete by: As directed    Call MD for:  temperature >100.4   Complete by:  As directed    Diet - low sodium heart healthy   Complete by: As directed    Discharge instructions   Complete by: As directed    You were cared for by a hospitalist during your hospital stay. If you have any questions about your discharge medications or the care you received while you were in the hospital after you are discharged, you can call the unit and ask to speak with the hospitalist on call if the hospitalist that took care of you is not available. Once you are discharged, your primary care physician will handle any further medical issues. Please note that NO REFILLS for any discharge medications will be authorized once you are discharged, as it is imperative that you return to your primary care physician (or establish a relationship with a primary care physician if you do not have one) for your aftercare needs so that they can reassess your need for medications and monitor your lab values.   Increase activity slowly   Complete by: As directed      Allergies as of 03/07/2020   No Known Allergies     Medication List    TAKE these medications   acetaminophen 500 MG tablet Commonly known as: TYLENOL Take 1,000 mg by mouth every 6 (six) hours as needed for moderate pain or headache.   amiodarone 200 MG tablet Commonly known as: PACERONE TAKE 1 TABLET BY MOUTH DAIY What changed: See the new instructions.   ergocalciferol 1.25 MG (50000 UT) capsule Commonly known as: VITAMIN D2 Take 50,000 Units by mouth every Friday. At night   ferrous sulfate 325 (65 FE) MG tablet Take 1 tablet (325 mg total) by mouth 2 (two) times daily with a meal.   furosemide 20 MG tablet Commonly known as: LASIX Take 2 tablets (40 mg total) by mouth daily. What changed: how much to take   hydrALAZINE 100 MG tablet Commonly known as: APRESOLINE Take 1 tablet (100 mg total) by mouth every 8 (eight) hours.   ipratropium 17 MCG/ACT inhaler Commonly known as: Atrovent HFA Inhale 2 puffs into the lungs  every 6 (six) hours as needed for wheezing.   isosorbide mononitrate 30 MG 24 hr tablet Commonly known as: IMDUR Take 1 tablet (30 mg total) by mouth daily.   Lantus SoloStar 100 UNIT/ML Solostar Pen Generic drug: insulin glargine Inject 20 Units into the skin daily.   levothyroxine 50 MCG tablet Commonly known as: SYNTHROID Take 50 mcg by mouth daily.   metoprolol succinate 100 MG 24 hr tablet Commonly known as: TOPROL-XL TAKE 1 TABLET BY MOUTH DAILY TAKE WITH OR IMMDIATELY FOLLOWING A MEAL What changed: See the new instructions.   oxyCODONE 5 MG immediate release tablet Commonly known as: Oxy IR/ROXICODONE Take 5 mg by mouth every 6 (six) hours as needed for pain.   polyethylene glycol powder 17 GM/SCOOP powder Commonly known as: MiraLax Take 17 g by mouth 2 (two) times daily as needed for moderate constipation.   pravastatin 40 MG tablet Commonly known as: Pravachol Take 1 tablet (40 mg total) by mouth every evening.   senna-docusate 8.6-50 MG tablet Commonly known as: Senokot-S Take 1 tablet by mouth 2 (two) times daily as needed for moderate constipation.   venlafaxine 75 MG tablet Commonly known as: EFFEXOR Take 75 mg by mouth daily.  Xarelto 15 MG Tabs tablet Generic drug: Rivaroxaban TAKE 1 TABLET BY MOUTH DAILY WITH SUPPER What changed:   how much to take  how to take this  when to take this  additional instructions      Follow-up Information    [facility].   Why: HHRN,HHPT, HHOT, HHAIDE  [place], [place]. Schedule an appointment as soon as possible for a visit in 1 week(s).   Specialty: General Practice Contact information: [place] [address] [phone]        Larey Dresser, MD. Go on 03/17/2020.   Specialty: Cardiology Contact information: [address] [place] [address] [phone]          No Known Allergies  Consultations:  None     Procedures/Studies: DG Chest 2 View  Result Date: 03/03/2020 CLINICAL DATA:  Shortness of breath. Chest tightness. Recent right rib fractures. EXAM: CHEST - 2 VIEW COMPARISON:  02/21/2020 and 02/22/2020 FINDINGS: The interstitial infiltrates noted on the prior study have slightly improved, possibly representing pulmonary edema. Heart size and pulmonary vascularity are normal. Pacemaker in place. No visible acute bone abnormality. The recently diagnosed right rib fractures are not visible on this exam. Aortic atherosclerosis. IMPRESSION: Slight improvement in the bilateral interstitial infiltrates, likely interstitial pulmonary edema. No other change. Aortic Atherosclerosis (ICD10-I70.0). Electronically Signed   By: Lorriane Shire M.D.   On: 03/03/2020 08:15   DG Abd 1 View  Result Date: 03/06/2020 CLINICAL DATA:  86 year old female with possible ileus on abdominal radiographs 3 days ago. EXAM: ABDOMEN - 1 VIEW COMPARISON:  Radiographs 03/03/2020 and earlier. FINDINGS: Portable AP supine view at 0521 hours. Gas in small and large bowel loops, non abnormally dilated. Overall the gas pattern has not significantly changed. Stable cholecystectomy clips. Lung bases appear to remain negative. Partially visible cardiac pacemaker leads. No acute osseous abnormality identified. IMPRESSION: Gas throughout nondilated large and small bowel loops not significantly changed since 03/03/2020. This is a nonobstructed pattern, but ileus remains possible. Electronically Signed   By: Genevie Ann M.D.   On: 03/06/2020 07:48   DG Abd 1 View  Result Date: 03/03/2020 CLINICAL DATA:  No appetite, constipation EXAM: ABDOMEN - 1 VIEW COMPARISON:  CT abdomen and pelvis December 04, 2018 FINDINGS: Bowel gas pattern with stool throughout the proximal colon. There are scattered loops of gas-filled small bowel in the left hemiabdomen. Gas is present in the rectum and descending colon. Perhaps small basilar effusions. Signs of  cholecystectomy. No abnormal calcifications though stool and gas projects over the renal contours. Right lateral tenth rib fracture with mild displacement. Has an appearance of an acute fracture. IMPRESSION: 1. Findings may represent mild ileus. No signs of obstruction. 2. Moderate stool burden in the proximal colon. 3. Acute right tenth rib fracture laterally. 4. Probable small basilar pleural effusions. Electronically Signed   By: Zetta Bills M.D.   On: 03/03/2020 12:59   CT Head Wo Contrast  Result Date: 02/22/2020 CLINICAL DATA:  Fall, found down EXAM: CT HEAD WITHOUT CONTRAST TECHNIQUE: Contiguous axial images were obtained from the base of the skull through the vertex without intravenous contrast. COMPARISON:  03/21/2019 FINDINGS: Brain: No evidence of acute infarction, hemorrhage, hydrocephalus, extra-axial collection or mass lesion/mass effect. Mild cortical atrophy. Subcortical white matter and periventricular small vessel ischemic changes. 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 Vascular: Mild intracranial atherosclerosis. Skull: Normal. Negative for fracture  or focal lesion. Sinuses/Orbits: The visualized paranasal sinuses are essentially clear. The mastoid air cells are unopacified. Other: None. IMPRESSION: No evidence of acute intracranial abnormality. Atrophy with small vessel ischemic changes. 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 Electronically Signed   By: Julian Hy M.D.   On: 02/22/2020 18:24   DG Chest Port 1 View  Result Date: 02/22/2020 CLINICAL DATA:  Confusion. EXAM: PORTABLE CHEST 1 VIEW COMPARISON:  None. FINDINGS: The right hilum is mildly prominent, possibly due to positioning. The heart, left hilum, and mediastinum are normal. Bilateral interstitial opacities. More patchy opacity on the right. No other acute abnormalities. IMPRESSION: 1. Bilateral interstitial opacities may represent edema or atypical infection. 2. Opacity is more patchy with a few more focal regions of  opacity on the right. This could represent asymmetric edema versus developing pneumonia. Electronically Signed   By: Dorise Bullion III M.D   On: 02/22/2020 16:50   US Abdomen Limited RUQ  Result Date: 02/23/2020 CLINICAL DATA:  86 year old female with elevated LFTs. EXAM: ULTRASOUND ABDOMEN LIMITED RIGHT UPPER QUADRANT COMPARISON:  CT abdomen pelvis dated 12/04/2018. FINDINGS: Gallbladder: Cholecystectomy. Common bile duct: Diameter: 6 mm Liver: There is mild coarsened liver echotexture with mild surface irregularity suggestive of early cirrhosis. Clinical correlation is recommended. Portal vein is patent on color Doppler imaging with normal direction of blood flow towards the liver. Other: Small right pleural effusion partially visualized. IMPRESSION: 1. Probable early cirrhosis.  Clinical correlation is recommended. 2. Cholecystectomy. 3. Small right pleural effusion. Electronically Signed   By: Anner Crete M.D.   On: 02/23/2020 19:30       Discharge Exam: Vitals:   03/07/20 0454 03/07/20 0908  BP: (!) 146/83 (!) 133/54  Pulse: 71 60  Resp: 20   Temp: 98.5 F (36.9 C)   SpO2: 96% 99%    General: Pt is alert, awake, not in acute distress Cardiovascular: RRR, S1/S2 +, no edema Respiratory: CTA bilaterally, no wheezing, no rhonchi, no respiratory distress, no conversational dyspnea, on room air  Abdominal: Soft, NT, ND, bowel sounds + Extremities: no edema, no cyanosis Psych: Normal mood and affect, stable judgement and insight     The results of significant diagnostics from this hospitalization (including imaging, microbiology, ancillary and laboratory) are listed below for reference.     Microbiology: Recent Results (from the past 240 hour(s))  Respiratory Panel by RT PCR (Flu A&B, Covid) - Nasopharyngeal Swab     Status: None   Collection Time: 03/03/20 10:00 AM   Specimen: Nasopharyngeal Swab  Result Value Ref Range Status   SARS Coronavirus 2 by RT PCR NEGATIVE  NEGATIVE Final    Comment: (NOTE) SARS-CoV-2 target nucleic acids are NOT DETECTED. The SARS-CoV-2 RNA is generally detectable in upper respiratoy specimens during the acute phase of infection. The lowest concentration of SARS-CoV-2 viral copies this assay can detect is 131 copies/mL. A negative result does not preclude SARS-Cov-2 infection and should not be used as the sole basis for treatment or other patient management decisions. A negative result may occur with  improper specimen collection/handling, submission of specimen other than nasopharyngeal swab, presence of viral mutation(s) within the areas targeted by this assay, and inadequate number of viral copies (<131 copies/mL). A negative result must be combined with clinical observations, patient history, and epidemiological information. The expected result is Negative. Fact Sheet for Patients:  PinkCheek.be Fact Sheet for Healthcare Providers:  GravelBags.it This test is not yet ap proved or cleared by the [place] and  has been authorized for detection and/or diagnosis of SARS-CoV-2 by FDA under an Emergency Use Authorization (EUA). This EUA will remain  in effect (meaning this test can be used) for the duration of the COVID-19 declaration under Section 564(b)(1) of the Act, 21 U.S.C. section 360bbb-3(b)(1), unless the authorization is terminated or revoked sooner.    Influenza A by PCR NEGATIVE NEGATIVE Final   Influenza B by PCR NEGATIVE NEGATIVE Final    Comment: (NOTE) The Xpert Xpress SARS-CoV-2/FLU/RSV assay is intended as an aid in  the diagnosis of influenza from Nasopharyngeal swab specimens and  should not be used as a sole basis for treatment. Nasal washings and  aspirates are unacceptable for Xpert Xpress SARS-CoV-2/FLU/RSV  testing. Fact Sheet for Patients: PinkCheek.be Fact Sheet for Healthcare  Providers: GravelBags.it This test is not yet approved or cleared by the [place] FDA and  has been authorized for detection and/or diagnosis of SARS-CoV-2 by  FDA under an Emergency Use Authorization (EUA). This EUA will remain  in effect (meaning this test can be used) for the duration of the  Covid-19 declaration under Section 564(b)(1) of the Act, 21  U.S.C. section 360bbb-3(b)(1), unless the authorization is  terminated or revoked. Performed at [facility], [place] [address], [place], [place] [address]      Labs: BNP (last 3 results) Recent Labs    02/22/20 1714 03/03/20 0957  BNP 1,098.3* 431.5*   Basic Metabolic Panel: Recent Labs  Lab 03/03/20 4008 03/03/20 1558 03/04/20 0532 03/05/20 0621 03/06/20 0623 03/07/20 0718  NA 138  --  139 136 134* 137  K 5.0  --  4.5 4.1 4.2 3.9  CL 104  --  103 100 99 100  CO2 24  --  30 27 26 27   GLUCOSE 225*  --  178* 145* 123* 157*  BUN 25*  --  25* 25* 23 22  CREATININE 1.74*  --  1.69* 1.77* 1.91* 1.86*  CALCIUM 9.5  --  9.3 9.1 9.2 9.0  MG  --  2.1  --   --   --   --   PHOS  --  2.9  --   --   --   --    Liver Function Tests: Recent Labs  Lab 03/04/20 0532  AST 18  ALT 27  ALKPHOS 161*  BILITOT 0.9  PROT 5.9*  ALBUMIN 3.0*   No results for input(s): LIPASE, AMYLASE in the last 168 hours. No results for input(s): AMMONIA in the last 168 hours. CBC: Recent Labs  Lab 03/03/20 0718 03/03/20 2017 03/04/20 0532  WBC 10.9*  --  9.4  HGB 11.0*  --  10.1*  HCT 34.6* 30.7* 31.5*  MCV 101.2*  --  99.7  PLT 232  --  208   Cardiac Enzymes: No results for input(s): CKTOTAL, CKMB, CKMBINDEX, TROPONINI in the last 168 hours. BNP: Invalid input(s): POCBNP CBG: Recent Labs  Lab 03/06/20 0545 03/06/20 1106 03/06/20 1617 03/06/20 2051 03/07/20 0605  GLUCAP 109* 215* 162* 224* 152*   D-Dimer No results for input(s): DDIMER in the last 72 hours. Hgb A1c No results for  input(s): HGBA1C in the last 72 hours. Lipid Profile No results for input(s): CHOL, HDL, LDLCALC, TRIG, CHOLHDL, LDLDIRECT in the last 72 hours. Thyroid function studies No results for input(s): TSH, T4TOTAL, T3FREE, THYROIDAB in the last 72 hours.  Invalid input(s): FREET3 Anemia work up No results for input(s): VITAMINB12, FOLATE, FERRITIN, TIBC, IRON, RETICCTPCT in the last  72 hours. Urinalysis    Component Value Date/Time   COLORURINE YELLOW 03/03/2020 1408   APPEARANCEUR CLEAR 03/03/2020 1408   LABSPEC 1.009 03/03/2020 1408   PHURINE 7.0 03/03/2020 1408   GLUCOSEU NEGATIVE 03/03/2020 1408   HGBUR NEGATIVE 03/03/2020 1408   [place] 03/03/2020 1408   [place] 03/03/2020 1408   PROTEINUR NEGATIVE 03/03/2020 1408   NITRITE NEGATIVE 03/03/2020 1408   LEUKOCYTESUR NEGATIVE 03/03/2020 1408   Sepsis Labs Invalid input(s): PROCALCITONIN,  WBC,  LACTICIDVEN Microbiology Recent Results (from the past 240 hour(s))  Respiratory Panel by RT PCR (Flu A&B, Covid) - Nasopharyngeal Swab     Status: None   Collection Time: 03/03/20 10:00 AM   Specimen: Nasopharyngeal Swab  Result Value Ref Range Status   SARS Coronavirus 2 by RT PCR NEGATIVE NEGATIVE Final    Comment: (NOTE) SARS-CoV-2 target nucleic acids are NOT DETECTED. The SARS-CoV-2 RNA is generally detectable in upper respiratoy specimens during the acute phase of infection. The lowest concentration of SARS-CoV-2 viral copies this assay can detect is 131 copies/mL. A negative result does not preclude SARS-Cov-2 infection and should not be used as the sole basis for treatment or other patient management decisions. A negative result may occur with  improper specimen collection/handling, submission of specimen other than nasopharyngeal swab, presence of viral mutation(s) within the areas targeted by this assay, and inadequate number of viral copies (<131 copies/mL). A negative result must be combined with  clinical observations, patient history, and epidemiological information. The expected result is Negative. Fact Sheet for Patients:  PinkCheek.be Fact Sheet for Healthcare Providers:  GravelBags.it This test is not yet ap proved or cleared by the [place] FDA and  has been authorized for detection and/or diagnosis of SARS-CoV-2 by FDA under an Emergency Use Authorization (EUA). This EUA will remain  in effect (meaning this test can be used) for the duration of the COVID-19 declaration under Section 564(b)(1) of the Act, 21 U.S.C. section 360bbb-3(b)(1), unless the authorization is terminated or revoked sooner.    Influenza A by PCR NEGATIVE NEGATIVE Final   Influenza B by PCR NEGATIVE NEGATIVE Final    Comment: (NOTE) The Xpert Xpress SARS-CoV-2/FLU/RSV assay is intended as an aid in  the diagnosis of influenza from Nasopharyngeal swab specimens and  should not be used as a sole basis for treatment. Nasal washings and  aspirates are unacceptable for Xpert Xpress SARS-CoV-2/FLU/RSV  testing. Fact Sheet for Patients: PinkCheek.be Fact Sheet for Healthcare Providers: GravelBags.it This test is not yet approved or cleared by the [place] FDA and  has been authorized for detection and/or diagnosis of SARS-CoV-2 by  FDA under an Emergency Use Authorization (EUA). This EUA will remain  in effect (meaning this test can be used) for the duration of the  Covid-19 declaration under Section 564(b)(1) of the Act, 21  U.S.C. section 360bbb-3(b)(1), unless the authorization is  terminated or revoked. Performed at [facility], [place] [address] [place], [place] [address]      Patient was seen and examined on the day of discharge and was found to be in stable condition. Time coordinating discharge: 25 minutes including assessment and coordination of care, as well as  examination of the patient.   SIGNED:  Dessa Phi, DO [facility] 03/07/2020, 9:24 AM

## 2020-03-13 ENCOUNTER — Other Ambulatory Visit (HOSPITAL_COMMUNITY): Payer: Self-pay | Admitting: Cardiology

## 2020-03-16 NOTE — Progress Notes (Signed)
Electrophysiology Office Note Date: 03/17/2020  ID:  Shannon Lucas, DOB August 05, 1931, MRN [id number]  PCP: Manfred Shirts, PA Primary Cardiologist: Loralie Champagne, MD Electrophysiologist: Constance Haw, MD   CC: Pacemaker follow-up  Shannon Lucas is a 86 y.o. female seen today for Shannon Meredith Leeds, MD for post hospital follow up.  Since discharge from hospital the patient reports doing "better and better each day". EP post hospital follow up made due to frequent AF noted during hospitalization, this all in the setting of UTI and probably PNA. Today, denies chest pain, palpitations, PND, orthopnea, nausea, vomiting, dizziness, syncope, edema, weight gain, or early satiety. Overall she has felt "much better" since Atrial therapies have been turned on for her device. Overall burden of ~10%.   Device History: Medtronic Dual Chamber PPM implanted 11/2017 for tachy-brady syndrome  Past Medical History:  Diagnosis Date  . A-fib ([place])   . Arthritis    "a little bit qwhere" (02/28/2017)  . CHF (congestive heart failure) ([place])   . CKD (chronic kidney disease), stage III    [name] 02/28/2017  . Depression   . GERD (gastroesophageal reflux disease)   . High cholesterol   . Hypertension   . Melanoma of back ([place])   . Myocardial infarction [facility])    "one dr said I'd had 1" (02/28/2017)  . Stroke [facility]) ~ 2010; ~2012   "affected the right side but I fully recovered; just a light one" (02/28/2017)  . Type II diabetes mellitus ([place])    Past Surgical History:  Procedure Laterality Date  . APPENDECTOMY    . FALSE ANEURYSM REPAIR Right 03/08/2017   Procedure: REPAIR RIGHT RADIAL FALSE ANEURYSM;  Surgeon: Rosetta Posner, MD;  Location: [place];  Service: Vascular;  Laterality: Right;  . KNEE SURGERY Left 1970s   "I have 1/3 of my knee left in there"  . LAPAROSCOPIC CHOLECYSTECTOMY    . MELANOMA EXCISION     "back"  . PACEMAKER IMPLANT N/A 11/20/2017   Procedure: PACEMAKER IMPLANT;  Surgeon: Constance Haw, MD;  Location: [place] CV LAB;  Service: Cardiovascular;  Laterality: N/A;  . RIGHT/LEFT HEART CATH AND CORONARY ANGIOGRAPHY N/A 03/06/2017   Procedure: Right/Left Heart Cath and Coronary Angiography;  Surgeon: Larey Dresser, MD;  Location: [place] CV LAB;  Service: Cardiovascular;  Laterality: N/A;  . TUBAL LIGATION      Current Outpatient Medications  Medication Sig Dispense Refill  . acetaminophen (TYLENOL) 500 MG tablet Take 1,000 mg by mouth every 6 (six) hours as needed for moderate pain or headache.    Marland Kitchen amiodarone (PACERONE) 200 MG tablet TAKE 1 TABLET BY MOUTH DAIY 30 tablet 3  . ergocalciferol (VITAMIN D2) 50000 units capsule Take 50,000 Units by mouth every Friday. At night    . ferrous sulfate 325 (65 FE) MG tablet Take 1 tablet (325 mg total) by mouth 2 (two) times daily with a meal. 90 tablet 6  . furosemide (LASIX) 20 MG tablet Take 2 tablets (40 mg total) by mouth daily. 60 tablet 1  . hydrALAZINE (APRESOLINE) 100 MG tablet Take 1 tablet (100 mg total) by mouth every 8 (eight) hours. 270 tablet 1  . ipratropium (ATROVENT HFA) 17 MCG/ACT inhaler Inhale 2 puffs into the lungs every 6 (six) hours as needed for wheezing. 1 Inhaler 12  . isosorbide mononitrate (IMDUR) 30 MG 24 hr tablet Take 1 tablet (30 mg total) by mouth daily. 30 tablet 11  . LANTUS SOLOSTAR  100 UNIT/ML Solostar Pen Inject 20 Units into the skin daily. 15 mL 3  . levothyroxine (SYNTHROID) 50 MCG tablet Take 50 mcg by mouth daily.    . metoprolol succinate (TOPROL-XL) 100 MG 24 hr tablet TAKE 1 TABLET BY MOUTH DAILY TAKE WITH OR IMMDIATELY FOLLOWING A MEAL 90 tablet 3  . oxyCODONE (OXY IR/ROXICODONE) 5 MG immediate release tablet Take 5 mg by mouth every 6 (six) hours as needed for pain.    . polyethylene glycol powder (MIRALAX) 17 GM/SCOOP powder Take 17 g by mouth 2 (two) times daily as needed for moderate constipation. 255 g 0  . pravastatin (PRAVACHOL) 40 MG tablet Take 1 tablet (40 mg  total) by mouth every evening. 90 tablet 1  . senna-docusate (SENOKOT-S) 8.6-50 MG tablet Take 1 tablet by mouth 2 (two) times daily as needed for moderate constipation.    Marland Kitchen venlafaxine (EFFEXOR) 75 MG tablet Take 75 mg by mouth daily.     [name] 15 MG TABS tablet TAKE 1 TABLET BY MOUTH DAILY WITH SUPPER 30 tablet 11   No current facility-administered medications for this visit.    Allergies:   Patient has no known allergies.   Social History: Social History   Socioeconomic History  . Marital status: Widowed    Spouse name: Not on file  . Number of children: Not on file  . Years of education: Not on file  . Highest education level: Not on file  Occupational History  . Not on file  Tobacco Use  . Smoking status: Former Research scientist (life sciences)  . Smokeless tobacco: Never Used  . Tobacco comment: 02/28/2017 "only smoked in the 1960s when we went out"  Substance and Sexual Activity  . Alcohol use: No  . Drug use: No  . Sexual activity: Never  Other Topics Concern  . Not on file  Social History Narrative  . Not on file   Social Determinants of Health   Financial Resource Strain:   . Difficulty of Paying Living Expenses:   Food Insecurity:   . Worried About Charity fundraiser in the Last Year:   . Arboriculturist in the Last Year:   Transportation Needs:   . Film/video editor (Medical):   Marland Kitchen Lack of Transportation (Non-Medical):   Physical Activity:   . Days of Exercise per Week:   . Minutes of Exercise per Session:   Stress:   . Feeling of Stress :   Social Connections:   . Frequency of Communication with Friends and Family:   . Frequency of Social Gatherings with Friends and Family:   . Attends Religious Services:   . Active Member of Clubs or Organizations:   . Attends Archivist Meetings:   Marland Kitchen Marital Status:   Intimate Partner Violence:   . Fear of Current or Ex-Partner:   . Emotionally Abused:   Marland Kitchen Physically Abused:   . Sexually Abused:     Family  History: Family History  Problem Relation Age of Onset  . Diabetes Mother   . Stroke Father   . Parkinson's disease Brother      Review of Systems: All other systems reviewed and are otherwise negative except as noted above.  Physical Exam: Vitals:   03/17/20 1322  BP: 138/62  Pulse: 69  SpO2: 98%  Weight: 209 lb 6.4 oz (95 kg)  Height: 5\' 5"  (1.651 m)     GEN- The patient is well appearing, alert and oriented x 3 today.  HEENT: normocephalic, atraumatic; sclera clear, conjunctiva pink; hearing intact; oropharynx clear; neck supple  Lungs- Clear to ausculation bilaterally, normal work of breathing.  No wheezes, rales, rhonchi Heart- Regular rate and rhythm, no murmurs, rubs or gallops  GI- soft, non-tender, non-distended, bowel sounds present  Extremities- no clubbing, cyanosis, or edema  MS- no significant deformity or atrophy Skin- warm and dry, no rash or lesion; PPM pocket well healed Psych- euthymic mood, full affect Neuro- strength and sensation are intact  PPM Interrogation- reviewed in detail today,  See PACEART report  EKG:  EKG is not ordered today.  Recent Labs: 03/03/2020: B Natriuretic Peptide 804.5; Magnesium 2.1; TSH 4.489 03/04/2020: ALT 27; Hemoglobin 10.1; Platelets 208 03/07/2020: BUN 22; Creatinine, Ser 1.86; Potassium 3.9; Sodium 137   Wt Readings from Last 3 Encounters:  03/17/20 209 lb 6.4 oz (95 kg)  03/07/20 206 lb (93.4 kg)  02/27/20 222 lb 11.2 oz (101 kg)     Other studies Reviewed: Additional studies/ records that were reviewed today include: Previous EP office notes, Previous remote checks, Most recent labwork.   Assessment and Plan:  1. Tachy-Brady syndrome s/p Medtronic PPM  Normal PPM function See Pace Art report No changes today  2. Paroxysmal afib Burden ~10% by device Continue amiodarone. Recent labwork stable.  Continue xarelto for CHA2DS2VASC of 9    3. Chronic systolic CHF, due to NICM with interval resolution of  systolic dysfunction Echo 12/2018 LVEF 60-65% NYHA II-III symptoms chronically Volume status apepars stable on exam today.  Continue toprol XL, [place], and spiro.   Current medicines are reviewed at length with the patient today.   The patient does not have concerns regarding her medicines.  The following changes were made today:  none  Labs/ tests ordered today include:  Orders Placed This Encounter  Procedures  . CUP PACEART INCLINIC DEVICE CHECK   Disposition:   Follow up with Dr. Curt Bears in 9 Months    Signed, Annamaria Helling  03/17/2020 2:44 PM  [place] [address] [phone] (office) [phone] (fax)

## 2020-03-17 ENCOUNTER — Encounter: Payer: Self-pay | Admitting: Student

## 2020-03-17 ENCOUNTER — Encounter (HOSPITAL_COMMUNITY): Payer: Self-pay | Admitting: Cardiology

## 2020-03-17 ENCOUNTER — Ambulatory Visit: Payer: Medicare Other | Admitting: Student

## 2020-03-17 ENCOUNTER — Other Ambulatory Visit: Payer: Self-pay

## 2020-03-17 ENCOUNTER — Telehealth: Payer: Self-pay | Admitting: Student

## 2020-03-17 ENCOUNTER — Ambulatory Visit (HOSPITAL_COMMUNITY)
Admission: RE | Admit: 2020-03-17 | Discharge: 2020-03-17 | Disposition: A | Discharge status: Home / Self Care | Payer: Medicare Other | Source: Ambulatory Visit | Attending: Cardiology | Admitting: Cardiology

## 2020-03-17 VITALS — BP 138/62 | HR 69 | Ht 65.0 in | Wt 209.4 lb

## 2020-03-17 VITALS — BP 136/40 | HR 79 | Wt 211.4 lb

## 2020-03-17 DIAGNOSIS — Z7901 Long term (current) use of anticoagulants: Secondary | ICD-10-CM | POA: Insufficient documentation

## 2020-03-17 DIAGNOSIS — I130 Hypertensive heart and chronic kidney disease with heart failure and stage 1 through stage 4 chronic kidney disease, or unspecified chronic kidney disease: Secondary | ICD-10-CM | POA: Diagnosis not present

## 2020-03-17 DIAGNOSIS — Z87440 Personal history of urinary (tract) infections: Secondary | ICD-10-CM | POA: Insufficient documentation

## 2020-03-17 DIAGNOSIS — Z87891 Personal history of nicotine dependence: Secondary | ICD-10-CM | POA: Insufficient documentation

## 2020-03-17 DIAGNOSIS — I2510 Atherosclerotic heart disease of native coronary artery without angina pectoris: Secondary | ICD-10-CM | POA: Diagnosis not present

## 2020-03-17 DIAGNOSIS — I4892 Unspecified atrial flutter: Secondary | ICD-10-CM | POA: Insufficient documentation

## 2020-03-17 DIAGNOSIS — Z794 Long term (current) use of insulin: Secondary | ICD-10-CM | POA: Diagnosis not present

## 2020-03-17 DIAGNOSIS — I428 Other cardiomyopathies: Secondary | ICD-10-CM | POA: Insufficient documentation

## 2020-03-17 DIAGNOSIS — D6489 Other specified anemias: Secondary | ICD-10-CM | POA: Insufficient documentation

## 2020-03-17 DIAGNOSIS — I480 Paroxysmal atrial fibrillation: Secondary | ICD-10-CM

## 2020-03-17 DIAGNOSIS — I5023 Acute on chronic systolic (congestive) heart failure: Secondary | ICD-10-CM | POA: Diagnosis not present

## 2020-03-17 DIAGNOSIS — I5032 Chronic diastolic (congestive) heart failure: Secondary | ICD-10-CM

## 2020-03-17 DIAGNOSIS — I5042 Chronic combined systolic (congestive) and diastolic (congestive) heart failure: Secondary | ICD-10-CM | POA: Insufficient documentation

## 2020-03-17 DIAGNOSIS — Z79890 Hormone replacement therapy (postmenopausal): Secondary | ICD-10-CM | POA: Insufficient documentation

## 2020-03-17 DIAGNOSIS — N183 Chronic kidney disease, stage 3 unspecified: Secondary | ICD-10-CM | POA: Diagnosis not present

## 2020-03-17 DIAGNOSIS — R001 Bradycardia, unspecified: Secondary | ICD-10-CM | POA: Diagnosis not present

## 2020-03-17 DIAGNOSIS — I495 Sick sinus syndrome: Secondary | ICD-10-CM | POA: Diagnosis not present

## 2020-03-17 DIAGNOSIS — K219 Gastro-esophageal reflux disease without esophagitis: Secondary | ICD-10-CM | POA: Diagnosis not present

## 2020-03-17 DIAGNOSIS — E785 Hyperlipidemia, unspecified: Secondary | ICD-10-CM | POA: Diagnosis not present

## 2020-03-17 DIAGNOSIS — E1122 Type 2 diabetes mellitus with diabetic chronic kidney disease: Secondary | ICD-10-CM | POA: Diagnosis not present

## 2020-03-17 DIAGNOSIS — Z950 Presence of cardiac pacemaker: Secondary | ICD-10-CM | POA: Diagnosis not present

## 2020-03-17 DIAGNOSIS — I5022 Chronic systolic (congestive) heart failure: Secondary | ICD-10-CM | POA: Diagnosis not present

## 2020-03-17 DIAGNOSIS — Z79899 Other long term (current) drug therapy: Secondary | ICD-10-CM | POA: Insufficient documentation

## 2020-03-17 DIAGNOSIS — Z8673 Personal history of transient ischemic attack (TIA), and cerebral infarction without residual deficits: Secondary | ICD-10-CM | POA: Diagnosis not present

## 2020-03-17 LAB — COMPREHENSIVE METABOLIC PANEL
ALT: 14 U/L (ref 0–44)
AST: 16 U/L (ref 15–41)
Albumin: 3.2 g/dL — ABNORMAL LOW (ref 3.5–5.0)
Alkaline Phosphatase: 128 U/L — ABNORMAL HIGH (ref 38–126)
Anion gap: 10 (ref 5–15)
BUN: 30 mg/dL — ABNORMAL HIGH (ref 8–23)
CO2: 23 mmol/L (ref 22–32)
Calcium: 9.2 mg/dL (ref 8.9–10.3)
Chloride: 105 mmol/L (ref 98–111)
Creatinine, Ser: 2.06 mg/dL — ABNORMAL HIGH (ref 0.44–1.00)
GFR calc Af Amer: 25 mL/min — ABNORMAL LOW (ref >60)
GFR calc non Af Amer: 21 mL/min — ABNORMAL LOW (ref >60)
Glucose, Bld: 269 mg/dL — ABNORMAL HIGH (ref 70–99)
Potassium: 4.3 mmol/L (ref 3.5–5.1)
Sodium: 138 mmol/L (ref 135–145)
Total Bilirubin: 1.1 mg/dL (ref 0.3–1.2)
Total Protein: 6.1 g/dL — ABNORMAL LOW (ref 6.5–8.1)

## 2020-03-17 LAB — LIPID PANEL
Cholesterol: 143 mg/dL (ref 0–200)
HDL: 43 mg/dL (ref >40)
LDL Cholesterol: 64 mg/dL (ref 0–99)
Total CHOL/HDL Ratio: 3.3 RATIO
Triglycerides: 182 mg/dL — ABNORMAL HIGH (ref <150)
VLDL: 36 mg/dL (ref 0–40)

## 2020-03-17 LAB — CUP PACEART INCLINIC DEVICE CHECK
Battery Remaining Longevity: 90 mo
Battery Voltage: 2.99 V
Brady Statistic AP VP Percent: 3.53 %
Brady Statistic AP VS Percent: 89.46 %
Brady Statistic AS VP Percent: 3.73 %
Brady Statistic AS VS Percent: 3.31 %
Brady Statistic RA Percent Paced: 84.04 %
Brady Statistic RV Percent Paced: 9.49 %
Date Time Interrogation Session: 20210504143831
Implantable Lead Implant Date: 20190107
Implantable Lead Implant Date: 20190107
Implantable Lead Location: 753859
Implantable Lead Location: 753860
Implantable Lead Model: 3830
Implantable Lead Model: 5076
Implantable Pulse Generator Implant Date: 20190107
Lead Channel Impedance Value: 285 Ohm
Lead Channel Impedance Value: 361 Ohm
Lead Channel Impedance Value: 380 Ohm
Lead Channel Impedance Value: 437 Ohm
Lead Channel Pacing Threshold Amplitude: 1 V
Lead Channel Pacing Threshold Pulse Width: 0.4 ms
Lead Channel Sensing Intrinsic Amplitude: 1.125 mV
Lead Channel Sensing Intrinsic Amplitude: 1.25 mV
Lead Channel Sensing Intrinsic Amplitude: 1.5 mV
Lead Channel Sensing Intrinsic Amplitude: 2.375 mV
Lead Channel Setting Pacing Amplitude: 2.5 V
Lead Channel Setting Pacing Amplitude: 3 V
Lead Channel Setting Pacing Pulse Width: 1 ms
Lead Channel Setting Sensing Sensitivity: 0.9 mV

## 2020-03-17 LAB — TSH: TSH: 3.511 u[IU]/mL (ref 0.350–4.500)

## 2020-03-17 LAB — CBC
HCT: 33.1 % — ABNORMAL LOW (ref 36.0–46.0)
Hemoglobin: 10.2 g/dL — ABNORMAL LOW (ref 12.0–15.0)
MCH: 31.7 pg (ref 26.0–34.0)
MCHC: 30.8 g/dL (ref 30.0–36.0)
MCV: 102.8 fL — ABNORMAL HIGH (ref 80.0–100.0)
Platelets: 272 10*3/uL (ref 150–400)
RBC: 3.22 MIL/uL — ABNORMAL LOW (ref 3.87–5.11)
RDW: 13.9 % (ref 11.5–15.5)
WBC: 7.2 10*3/uL (ref 4.0–10.5)
nRBC: 0 % (ref 0.0–0.2)

## 2020-03-17 LAB — BRAIN NATRIURETIC PEPTIDE: B Natriuretic Peptide: 381.2 pg/mL — ABNORMAL HIGH (ref 0.0–100.0)

## 2020-03-17 NOTE — Patient Instructions (Addendum)
Medication Instructions:  none *If you need a refill on your cardiac medications before your next appointment, please call your pharmacy*   Lab Work: none If you have labs (blood work) drawn today and your tests are completely normal, you will receive your results only by: Marland Kitchen MyChart Message (if you have MyChart) OR . A paper copy in the mail If you have any lab test that is abnormal or we need to change your treatment, we will call you to review the results.   Testing/Procedures: none   Follow-Up: At [facility], you and your health needs are our priority.  As part of our continuing mission to provide you with exceptional heart care, we have created designated Provider Care Teams.  These Care Teams include your primary Cardiologist (physician) and Advanced Practice Providers (APPs -  Physician Assistants and Nurse Practitioners) who all work together to provide you with the care you need, when you need it.  We recommend signing up for the patient portal called "MyChart".  Sign up information is provided on this After Visit Summary.  MyChart is used to connect with patients for Virtual Visits (Telemedicine).  Patients are able to view lab/test results, encounter notes, upcoming appointments, etc.  Non-urgent messages can be sent to your provider as well.   To learn more about what you can do with MyChart, go to [email or web address].    Your next appointment:  [place] 9 months  The format for your next appointment:   Either In Person or Virtual  Provider:   Dr Curt Bears   Other Instructions Remote monitoring is used to monitor your Pacemaker  from home. This monitoring reduces the number of office visits required to check your device to one time per year. It allows [place] to keep an eye on the functioning of your device to ensure it is working properly. You are scheduled for a device check from home on 6/14. You may send your transmission at any time that day. If you have a wireless  device, the transmission will be sent automatically. After your physician reviews your transmission, you will receive a postcard with your next transmission date.

## 2020-03-17 NOTE — Telephone Encounter (Signed)
      I went in pt's chart to see who called her today(03-17-20)

## 2020-03-17 NOTE — Patient Instructions (Signed)
No medication changes today!  Labs today We will only contact you if something comes back abnormal or we need to make some changes. Otherwise no news is good news!  Your physician has requested that you have an echocardiogram. Echocardiography is a painless test that uses sound waves to create images of your heart. It provides your doctor with information about the size and shape of your heart and how well your heart's chambers and valves are working. This procedure takes approximately one hour. There are no restrictions for this procedure.  .Your physician recommends that you schedule a follow-up appointment in: 1 month with the Nurse Practitioner  Please call office at [phone] option 2 if you have any questions or concerns.   At the [facility], you and your health needs are our priority. As part of our continuing mission to provide you with exceptional heart care, we have created designated Provider Care Teams. These Care Teams include your primary Cardiologist (physician) and Advanced Practice Providers (APPs- Physician Assistants and Nurse Practitioners) who all work together to provide you with the care you need, when you need it.   You may see any of the following providers on your designated Care Team at your next follow up: Marland Kitchen Dr Glori Bickers . Dr Loralie Champagne . Darrick Grinder, NP . Lyda Jester, PA . Audry Riles, PharmD   Please be sure to bring in all your medications bottles to every appointment.

## 2020-03-17 NOTE — Progress Notes (Signed)
ReDS Vest / Clip - 03/17/20 1500      ReDS Vest / Clip   Station Marker  A    Ruler Value  31    ReDS Value Range  Low volume    ReDS Actual Value  35    Anatomical Comments  sitting

## 2020-03-18 NOTE — Progress Notes (Signed)
Advanced Heart Failure Clinic Note   Primary Care: [facility], [place] HF Cardiology: Dr. Aundra Dubin  HPI:  Shannon Lucas is a 86 y.o. female with PMH of Systolic CHF,  PAF on Xarelto, CKD stage III, DM II, GERD, HLD, CVA, anemia.   Admitted 4/17 -> 7/51/02 with A/C systolic CHF. L/RHC as below with normal coronaries and elevated filling pressures. Diuresed 5 lbs. Hospital course complicated by hematoma at radial cath site that improved overnight. Medications adjusted as tolerated. Discharge weight 205 lbs.    Pt seen in ED 03/08/17 for worsening of hematoma as above.  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 Dr. Donnetta Hutching saw in consult and repaired under local anaesthesia and sedation.   Echo in 10/18 showed EF 35-40% with diffuse hypokinesis, PASP 50 mmHg.  Holter in 10/18 showed runs of atrial fibrillation including afib with aberrancy.  Bradycardia into the 30s was also noted.  Decision was made to place PPM => patient got Medtronic device with His bundle lead and is now His pacing.    Echo in 2/20 showed EF up to 60-65%, mild LVH, normal RV.   She was admitted in 4/21 with fall, rib fractures, sepsis due to UTI, and CHF exacerbation. She was in and out of atrial fibrillation in the hospital.  She was diuresed.   She presents today for followup of CHF.  She saw EP earlier today, atrial fibrillation burden was noted to be 10% on her device. She is now living with her daughter.  She is short of breath walking about 100 feet but generally does well walking around the house.  She uses a walker.  She has chronic orthopnea and sleeps with the head of her bed raised.  No chest pain.  No lightheadedness.   ECG (personally reviewed): a-paced  Labs (04/06/17): K 5.0, Creatinine 2.04, BUN 43, LFTs normal Labs (6/18): K 3.9, creatinine 1.68 Labs (8/18): LDL 83, HDL 51, hgb 11.1, K 4.5, creatinine 1.48, LFTs normal, TSH normal.  Labs (1/19): K 4, creatinine 1.5, hgb 12.2, TSH normal, LFTs normal Labs (7/19): K 4.3,  creatinine 1.78, LDL 105, TSH normal, LFTs normal Labs (1/20): K 3.9, creatinine 1.95 Labs (4/21): K 3.9, creatinine 1.86  REDS clip 35%  Review of systems complete and found to be negative unless listed in HPI.    PMH 1. Chronic systolic CHF: Nonischemic cardiomyopathy.  - Echo 03/01/17 LVEF 25-30%, At least mild AS, Mild MR, Mod LAE, Mod RAE, Trivial PI, PA peak pressure 42 mm Hg. - LHC (4/18) with nonobstructive CAD.  - Echo (10/18): EF 35-40%, diffuse hypokinesis, mild LVH, mild MR, mild AS, PASP 50 mmHg.  - Medtronic PPM with His bundle lead placed.  - Echo (2/20): EF 60-65%, mild LVH, normal RV size and systolic function.  2. Atrial fibrillation: Paroxysmal. On Xarelto and amiodarone.  - Atypical atrial flutter noted 05/28/18.  3. CKD Stage III 4. DMII 5. GERD 6. HLD 7. Hx of CVA - ?2011-2012 with no lasting deficit 8. Chronic anemia 9. CAD: LHC (4/18) with 70% ostial D1, 50% ostial D2, 40% ostial OM1.  10. Aortic stenosis: Mild on echo in 10/18.  11. 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   Current Outpatient Medications  Medication Sig Dispense Refill  . acetaminophen (TYLENOL) 500 MG tablet Take 1,000 mg by mouth every 6 (six) hours as needed for moderate pain or headache.    Marland Kitchen amiodarone (PACERONE) 200 MG tablet TAKE 1 TABLET BY MOUTH DAIY 30 tablet 3  .  ergocalciferol (VITAMIN D2) 50000 units capsule Take 50,000 Units by mouth every Friday. At night    . ferrous sulfate 325 (65 FE) MG tablet Take 1 tablet (325 mg total) by mouth 2 (two) times daily with a meal. 90 tablet 6  . furosemide (LASIX) 20 MG tablet Take 2 tablets (40 mg total) by mouth daily. 60 tablet 1  . hydrALAZINE (APRESOLINE) 100 MG tablet Take 1 tablet (100 mg total) by mouth every 8 (eight) hours. 270 tablet 1  . ipratropium (ATROVENT HFA) 17 MCG/ACT inhaler Inhale 2 puffs into the lungs every 6 (six) hours as needed for wheezing. 1 Inhaler 12  . isosorbide mononitrate (IMDUR) 30 MG 24 hr  tablet Take 1 tablet (30 mg total) by mouth daily. 30 tablet 11  . LANTUS SOLOSTAR 100 UNIT/ML Solostar Pen Inject 20 Units into the skin daily. 15 mL 3  . levothyroxine (SYNTHROID) 50 MCG tablet Take 50 mcg by mouth daily.    . metoprolol succinate (TOPROL-XL) 100 MG 24 hr tablet TAKE 1 TABLET BY MOUTH DAILY TAKE WITH OR IMMDIATELY FOLLOWING A MEAL 90 tablet 3  . oxyCODONE (OXY IR/ROXICODONE) 5 MG immediate release tablet Take 5 mg by mouth every 6 (six) hours as needed for pain.    . polyethylene glycol powder (MIRALAX) 17 GM/SCOOP powder Take 17 g by mouth 2 (two) times daily as needed for moderate constipation. 255 g 0  . pravastatin (PRAVACHOL) 40 MG tablet Take 1 tablet (40 mg total) by mouth every evening. 90 tablet 1  . senna-docusate (SENOKOT-S) 8.6-50 MG tablet Take 1 tablet by mouth 2 (two) times daily as needed for moderate constipation.    Marland Kitchen venlafaxine (EFFEXOR) 75 MG tablet Take 75 mg by mouth daily.     [name] 15 MG TABS tablet TAKE 1 TABLET BY MOUTH DAILY WITH SUPPER 30 tablet 11   No current facility-administered medications for this encounter.   No Known Allergies   Social History   Socioeconomic History  . Marital status: Widowed    Spouse name: Not on file  . Number of children: Not on file  . Years of education: Not on file  . Highest education level: Not on file  Occupational History  . Not on file  Tobacco Use  . Smoking status: Former Research scientist (life sciences)  . Smokeless tobacco: Never Used  . Tobacco comment: 02/28/2017 "only smoked in the 1960s when we went out"  Substance and Sexual Activity  . Alcohol use: No  . Drug use: No  . Sexual activity: Never  Other Topics Concern  . Not on file  Social History Narrative  . Not on file   Social Determinants of Health   Financial Resource Strain:   . Difficulty of Paying Living Expenses:   Food Insecurity:   . Worried About Charity fundraiser in the Last Year:   . Arboriculturist in the Last Year:   Transportation  Needs:   . Film/video editor (Medical):   Marland Kitchen Lack of Transportation (Non-Medical):   Physical Activity:   . Days of Exercise per Week:   . Minutes of Exercise per Session:   Stress:   . Feeling of Stress :   Social Connections:   . Frequency of Communication with Friends and Family:   . Frequency of Social Gatherings with Friends and Family:   . Attends Religious Services:   . Active Member of Clubs or Organizations:   . Attends Archivist Meetings:   .  Marital Status:   Intimate Partner Violence:   . Fear of Current or Ex-Partner:   . Emotionally Abused:   Marland Kitchen Physically Abused:   . Sexually Abused:    Family history No family history of premature CAD or CHF.   Vitals:   03/17/20 1509  BP: (!) 136/40  Pulse: 79  SpO2: 94%  Weight: 95.9 kg (211 lb 6.4 oz)     Wt Readings from Last 3 Encounters:  03/17/20 95.9 kg (211 lb 6.4 oz)  03/17/20 95 kg (209 lb 6.4 oz)  03/07/20 93.4 kg (206 lb)    PHYSICAL EXAM: General: NAD Neck: No JVD, no thyromegaly or thyroid nodule.  Lungs: Clear to auscultation bilaterally with normal respiratory effort. CV: Nondisplaced PMI.  Heart regular S1/S2, no S3/S4, 2/6 early SEM RUSB.  No peripheral edema.  No carotid bruit.  Normal pedal pulses.  Abdomen: Soft, nontender, no hepatosplenomegaly, no distention.  Skin: Intact without lesions or rashes.  Neurologic: Alert and oriented x 3.  Psych: Normal affect. Extremities: No clubbing or cyanosis.  HEENT: Normal.   ASSESSMENT & PLAN:  1. Chronic systolic=>diastolic HF:  Echo with EF 25-30% 02/2017, repeat echo 10/18 showed EF 35-40%. Nonischemic cardiomyopathy. Medtronic device with His bundle lead was placed.  Most recent echo in 2/20 showed EF up to 60-65% with normal RV.  NYHA III currently. RED clip suggests borderline fluid overload, she does not look particularly volume overloaded on exam.  - Continue Lasix 40 mg daily.  BMET/BNP today.   - Continue Toprol XL 100 mg daily.    - I will arrange for repeat echo to make sure that EF remains in normal range.   2. CKD: Stage III - BMET today.  3. PAF/atrial flutter: A-paced today.  - Continue amiodarone.  Check TSH, LFTs. She will need regular eye exams.   - Continue Xarelto. CBC today.  4. CAD: Nonobstructive on 4/18 cath.     - Continue pravastatin, check lipids today.  - No ASA given stable CAD and Xarelto use.  5. Bradycardia: She is s/p Medtronic PPM with His bundle pacing.  6. HTN: Continue hydralazine/Imdur, Toprol XL.   Followup 1 month with NP/PA to make sure volume remains optimized.   Loralie Champagne 03/18/2020

## 2020-03-20 ENCOUNTER — Other Ambulatory Visit (HOSPITAL_COMMUNITY): Payer: Self-pay | Admitting: Cardiology

## 2020-03-20 ENCOUNTER — Encounter: Payer: Medicare Other | Admitting: Student

## 2020-04-08 ENCOUNTER — Other Ambulatory Visit (HOSPITAL_COMMUNITY): Payer: Self-pay | Admitting: Cardiology

## 2020-04-20 ENCOUNTER — Encounter (HOSPITAL_COMMUNITY): Payer: Self-pay

## 2020-04-20 ENCOUNTER — Ambulatory Visit (HOSPITAL_BASED_OUTPATIENT_CLINIC_OR_DEPARTMENT_OTHER)
Admission: RE | Admit: 2020-04-20 | Discharge: 2020-04-20 | Disposition: A | Discharge status: Home / Self Care | Payer: Medicare Other | Source: Ambulatory Visit | Attending: Cardiology | Admitting: Cardiology

## 2020-04-20 ENCOUNTER — Ambulatory Visit (HOSPITAL_COMMUNITY)
Admission: RE | Admit: 2020-04-20 | Discharge: 2020-04-20 | Disposition: A | Discharge status: Home / Self Care | Payer: Medicare Other | Source: Ambulatory Visit | Attending: Cardiology | Admitting: Cardiology

## 2020-04-20 ENCOUNTER — Other Ambulatory Visit: Payer: Self-pay

## 2020-04-20 VITALS — BP 138/78 | HR 76 | Wt 205.8 lb

## 2020-04-20 DIAGNOSIS — E1165 Type 2 diabetes mellitus with hyperglycemia: Secondary | ICD-10-CM | POA: Diagnosis not present

## 2020-04-20 DIAGNOSIS — E1122 Type 2 diabetes mellitus with diabetic chronic kidney disease: Secondary | ICD-10-CM | POA: Insufficient documentation

## 2020-04-20 DIAGNOSIS — Z87891 Personal history of nicotine dependence: Secondary | ICD-10-CM | POA: Insufficient documentation

## 2020-04-20 DIAGNOSIS — I4892 Unspecified atrial flutter: Secondary | ICD-10-CM | POA: Diagnosis not present

## 2020-04-20 DIAGNOSIS — E785 Hyperlipidemia, unspecified: Secondary | ICD-10-CM | POA: Diagnosis not present

## 2020-04-20 DIAGNOSIS — Z794 Long term (current) use of insulin: Secondary | ICD-10-CM | POA: Diagnosis not present

## 2020-04-20 DIAGNOSIS — Z7901 Long term (current) use of anticoagulants: Secondary | ICD-10-CM | POA: Insufficient documentation

## 2020-04-20 DIAGNOSIS — I5042 Chronic combined systolic (congestive) and diastolic (congestive) heart failure: Secondary | ICD-10-CM | POA: Insufficient documentation

## 2020-04-20 DIAGNOSIS — Z79899 Other long term (current) drug therapy: Secondary | ICD-10-CM | POA: Diagnosis not present

## 2020-04-20 DIAGNOSIS — I350 Nonrheumatic aortic (valve) stenosis: Secondary | ICD-10-CM | POA: Insufficient documentation

## 2020-04-20 DIAGNOSIS — R269 Unspecified abnormalities of gait and mobility: Secondary | ICD-10-CM | POA: Insufficient documentation

## 2020-04-20 DIAGNOSIS — N183 Chronic kidney disease, stage 3 unspecified: Secondary | ICD-10-CM | POA: Insufficient documentation

## 2020-04-20 DIAGNOSIS — Z8673 Personal history of transient ischemic attack (TIA), and cerebral infarction without residual deficits: Secondary | ICD-10-CM | POA: Insufficient documentation

## 2020-04-20 DIAGNOSIS — I480 Paroxysmal atrial fibrillation: Secondary | ICD-10-CM | POA: Diagnosis not present

## 2020-04-20 DIAGNOSIS — R001 Bradycardia, unspecified: Secondary | ICD-10-CM | POA: Diagnosis not present

## 2020-04-20 DIAGNOSIS — I428 Other cardiomyopathies: Secondary | ICD-10-CM | POA: Diagnosis not present

## 2020-04-20 DIAGNOSIS — I130 Hypertensive heart and chronic kidney disease with heart failure and stage 1 through stage 4 chronic kidney disease, or unspecified chronic kidney disease: Secondary | ICD-10-CM | POA: Diagnosis not present

## 2020-04-20 DIAGNOSIS — I5023 Acute on chronic systolic (congestive) heart failure: Secondary | ICD-10-CM

## 2020-04-20 DIAGNOSIS — I5022 Chronic systolic (congestive) heart failure: Secondary | ICD-10-CM | POA: Diagnosis not present

## 2020-04-20 DIAGNOSIS — I2510 Atherosclerotic heart disease of native coronary artery without angina pectoris: Secondary | ICD-10-CM | POA: Insufficient documentation

## 2020-04-20 LAB — CBC
HCT: 31.2 % — ABNORMAL LOW (ref 36.0–46.0)
Hemoglobin: 9.8 g/dL — ABNORMAL LOW (ref 12.0–15.0)
MCH: 31.8 pg (ref 26.0–34.0)
MCHC: 31.4 g/dL (ref 30.0–36.0)
MCV: 101.3 fL — ABNORMAL HIGH (ref 80.0–100.0)
Platelets: 186 10*3/uL (ref 150–400)
RBC: 3.08 MIL/uL — ABNORMAL LOW (ref 3.87–5.11)
RDW: 13.5 % (ref 11.5–15.5)
WBC: 5.8 10*3/uL (ref 4.0–10.5)
nRBC: 0 % (ref 0.0–0.2)

## 2020-04-20 LAB — BASIC METABOLIC PANEL
Anion gap: 9 (ref 5–15)
BUN: 23 mg/dL (ref 8–23)
CO2: 23 mmol/L (ref 22–32)
Calcium: 8.9 mg/dL (ref 8.9–10.3)
Chloride: 106 mmol/L (ref 98–111)
Creatinine, Ser: 1.81 mg/dL — ABNORMAL HIGH (ref 0.44–1.00)
GFR calc Af Amer: 29 mL/min — ABNORMAL LOW (ref >60)
GFR calc non Af Amer: 25 mL/min — ABNORMAL LOW (ref >60)
Glucose, Bld: 338 mg/dL — ABNORMAL HIGH (ref 70–99)
Potassium: 4.1 mmol/L (ref 3.5–5.1)
Sodium: 138 mmol/L (ref 135–145)

## 2020-04-20 NOTE — Progress Notes (Signed)
[facility] Note   Primary Care: [facility], [place] HF Cardiology: Dr. Aundra Dubin  HPI:  Shannon Lucas is a 86 y.o. female with PMH of Systolic CHF,  PAF on Xarelto, CKD stage III, DM II, GERD, HLD, CVA, anemia.   Admitted 4/17 -> 8/36/62 with A/C systolic CHF. L/RHC as below with normal coronaries and elevated filling pressures. Diuresed 5 lbs. Hospital course complicated by hematoma at radial cath site that improved overnight. Medications adjusted as tolerated. Discharge weight 205 lbs.    Pt seen in ED 03/08/17 for worsening of hematoma as above.  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 Dr. Donnetta Hutching saw in consult and repaired under local anaesthesia and sedation.   Echo in 10/18 showed EF 35-40% with diffuse hypokinesis, PASP 50 mmHg.  Holter in 10/18 showed runs of atrial fibrillation including afib with aberrancy.  Bradycardia into the 30s was also noted.  Decision was made to place PPM => patient got Medtronic device with His bundle lead and is now His pacing.    Echo in 2/20 showed EF up to 60-65%, mild LVH, normal RV.   She was admitted in 4/21 with fall, rib fractures, sepsis due to UTI, and CHF exacerbation. She was in and out of atrial fibrillation in the hospital.  She was diuresed.   Seen back for post hospital 5/21 and doing well from cardiac standpoint. Euvolemic. Repeat echo ordered. This was done today. Interpretation pending.   She presents back to clinic today for f/u. She is here w/ her daughter, whom she resides with. Notes stable NYHA Class III symptoms. Ambulating w/ walker. Has had 2 additional falls in the last 2 weeks due to unstable gait, 1 leading to large rt upper arm hematoma. Denies syncope. BP stable. She has home PT arranged.   She is euvolemic on exam. Wt is down 6 lb from previous OV. Denies CP. No palpitations. Her main concern is elevated blood glucose readings, in the 200 range. Her daughter reports that prior to her last hospitalization, she was  taking 55 units of Lantus a day. This was reduced down to 20 units daily. However since, her discharge, she has been eating more.    Labs (04/06/17): K 5.0, Creatinine 2.04, BUN 43, LFTs normal Labs (6/18): K 3.9, creatinine 1.68 Labs (8/18): LDL 83, HDL 51, hgb 11.1, K 4.5, creatinine 1.48, LFTs normal, TSH normal.  Labs (1/19): K 4, creatinine 1.5, hgb 12.2, TSH normal, LFTs normal Labs (7/19): K 4.3, creatinine 1.78, LDL 105, TSH normal, LFTs normal Labs (1/20): K 3.9, creatinine 1.95 Labs (4/21): K 3.9, creatinine 1.86 Labs (5/21): K 4.3, creatinine 2.06, TSH nl, TFTs nl. hgb 10.2    Review of systems complete and found to be negative unless listed in HPI.    PMH 1. Chronic systolic CHF: Nonischemic cardiomyopathy.  - Echo 03/01/17 LVEF 25-30%, At least mild AS, Mild MR, Mod LAE, Mod RAE, Trivial PI, PA peak pressure 42 mm Hg. - LHC (4/18) with nonobstructive CAD.  - Echo (10/18): EF 35-40%, diffuse hypokinesis, mild LVH, mild MR, mild AS, PASP 50 mmHg.  - Medtronic PPM with His bundle lead placed.  - Echo (2/20): EF 60-65%, mild LVH, normal RV size and systolic function.  2. Atrial fibrillation: Paroxysmal. On Xarelto and amiodarone.  - Atypical atrial flutter noted 05/28/18.  3. CKD Stage III 4. DMII 5. GERD 6. HLD 7. Hx of CVA - ?2011-2012 with no lasting deficit 8. Chronic anemia 9. CAD: LHC (4/18) with  70% ostial D1, 50% ostial D2, 40% ostial OM1.  10. Aortic stenosis: Mild on echo in 10/18.  11. 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   Current Outpatient Medications  Medication Sig Dispense Refill  . acetaminophen (TYLENOL) 500 MG tablet Take 1,000 mg by mouth every 6 (six) hours as needed for moderate pain or headache.    Marland Kitchen amiodarone (PACERONE) 200 MG tablet TAKE 1 TABLET BY MOUTH DAIY 30 tablet 3  . ergocalciferol (VITAMIN D2) 50000 units capsule Take 50,000 Units by mouth every Friday. At night    . ferrous sulfate 325 (65 FE) MG tablet Take 1 tablet (325  mg total) by mouth 2 (two) times daily with a meal. 90 tablet 6  . furosemide (LASIX) 20 MG tablet Take 2 tablets (40 mg total) by mouth daily. 60 tablet 1  . hydrALAZINE (APRESOLINE) 100 MG tablet Take 1 tablet (100 mg total) by mouth every 8 (eight) hours. 270 tablet 1  . ipratropium (ATROVENT HFA) 17 MCG/ACT inhaler Inhale 2 puffs into the lungs every 6 (six) hours as needed for wheezing. 1 Inhaler 12  . isosorbide mononitrate (IMDUR) 30 MG 24 hr tablet TAKE 1 TABLET BY MOUTH DAILY 90 tablet 3  . LANTUS SOLOSTAR 100 UNIT/ML Solostar Pen Inject 20 Units into the skin daily. 15 mL 3  . levothyroxine (SYNTHROID) 50 MCG tablet Take 50 mcg by mouth daily.    . metoprolol succinate (TOPROL-XL) 100 MG 24 hr tablet TAKE 1 TABLET BY MOUTH DAILY IMMEDIATELYFOLLOWING A MEAL 90 tablet 3  . oxyCODONE (OXY IR/ROXICODONE) 5 MG immediate release tablet Take 5 mg by mouth every 6 (six) hours as needed for pain.    . polyethylene glycol powder (MIRALAX) 17 GM/SCOOP powder Take 17 g by mouth 2 (two) times daily as needed for moderate constipation. 255 g 0  . pravastatin (PRAVACHOL) 40 MG tablet Take 1 tablet (40 mg total) by mouth every evening. 90 tablet 1  . senna-docusate (SENOKOT-S) 8.6-50 MG tablet Take 1 tablet by mouth 2 (two) times daily as needed for moderate constipation.    Marland Kitchen venlafaxine (EFFEXOR) 75 MG tablet Take 75 mg by mouth daily.     [name] 15 MG TABS tablet TAKE 1 TABLET BY MOUTH DAILY WITH SUPPER 30 tablet 11   No current facility-administered medications for this encounter.   No Known Allergies   Social History   Socioeconomic History  . Marital status: Widowed    Spouse name: Not on file  . Number of children: Not on file  . Years of education: Not on file  . Highest education level: Not on file  Occupational History  . Not on file  Tobacco Use  . Smoking status: Former Research scientist (life sciences)  . Smokeless tobacco: Never Used  . Tobacco comment: 02/28/2017 "only smoked in the 1960s when we  went out"  Substance and Sexual Activity  . Alcohol use: No  . Drug use: No  . Sexual activity: Never  Other Topics Concern  . Not on file  Social History Narrative  . Not on file   Social Determinants of Health   Financial Resource Strain:   . Difficulty of Paying Living Expenses:   Food Insecurity:   . Worried About Charity fundraiser in the Last Year:   . Arboriculturist in the Last Year:   Transportation Needs:   . Film/video editor (Medical):   Marland Kitchen Lack of Transportation (Non-Medical):   Physical Activity:   .  Days of Exercise per Week:   . Minutes of Exercise per Session:   Stress:   . Feeling of Stress :   Social Connections:   . Frequency of Communication with Friends and Family:   . Frequency of Social Gatherings with Friends and Family:   . Attends Religious Services:   . Active Member of Clubs or Organizations:   . Attends Archivist Meetings:   Marland Kitchen Marital Status:   Intimate Partner Violence:   . Fear of Current or Ex-Partner:   . Emotionally Abused:   Marland Kitchen Physically Abused:   . Sexually Abused:    Family history No family history of premature CAD or CHF.   Vitals:   04/20/20 1410  BP: 138/78  Pulse: 76  SpO2: 98%  Weight: 93.4 kg (205 lb 12.8 oz)     Wt Readings from Last 3 Encounters:  04/20/20 93.4 kg (205 lb 12.8 oz)  03/17/20 95.9 kg (211 lb 6.4 oz)  03/17/20 95 kg (209 lb 6.4 oz)    PHYSICAL EXAM: General:  Well appearing elderly WF. No respiratory difficulty HEENT: normal Neck: supple. no JVD. Carotids 2+ bilat; no bruits. No lymphadenopathy or thyromegaly appreciated. Cor: PMI nondisplaced. Regular rate & rhythm. No rubs, gallops or murmurs. Lungs: clear Abdomen: soft, nontender, nondistended. No hepatosplenomegaly. No bruits or masses. Good bowel sounds. Extremities: no cyanosis, clubbing, rash, trace bilateral LE edema, Rt upper arm w/ large hematoma  Neuro: alert & oriented x 3, cranial nerves grossly intact. moves all 4  extremities w/o difficulty. Affect pleasant.   ASSESSMENT & PLAN:  1. Chronic systolic=>diastolic HF:  Echo with EF 25-30% 02/2017, repeat echo 10/18 showed EF 35-40%. Nonischemic cardiomyopathy. Medtronic device with His bundle lead was placed.  Most recent echo in 2/20 showed EF up to 60-65% with normal RV. Had repeat echo today. Interpretation pending. Chronically NYHA III. Euvolemic on exam  - Continue Lasix 40 mg daily.  Check BMP today  - Continue Toprol XL 100 mg daily.  - off Entresto and Arlyce Harman w/ CKD 2. CKD: Stage III - Check BMET today.  3. PAF/atrial flutter: RRR on exam. HR well controlled.  - Continue amiodarone 200 mg daily. TSH and LFTs checked last visit, 5/21 and WNL. She will need regular eye exams.   - Continue Xarelto for now and check CBC today. Will discuss continuation of long term a/c w/ Dr. Aundra Dubin as she appears to be high fall risk. She has had multiple falls in the last 2 months, 1 resulting in rib fracture and another w/ large rt upper arm hematoma. She denies any head injury. She has home health PT arranged to help w/ strength and gait stability. Her most recent device interrogation 5/21 showed Afib burden ~10%. Other option could be potential Watchman device.  4. CAD: Nonobstructive on 4/18 cath.     - Continue pravastatin. Recent lipid panel 5/21 w/ controlled LDL at 64 mg/dL.  - No ASA given stable CAD and Xarelto use.  5. Bradycardia: She is s/p Medtronic PPM with His bundle pacing.  6. HTN: Controlled on current regimen. Continue hydralazine/Imdur, Toprol XL.  7. DM: Pt reports home glucose readings have been elevated in the mid 200 range after Lantus dose reduction during recent hospitalization (down from 55>>20 units daily. Her PO intake has increased since discharge. I will route to her PCP. May need to go back to original dose of 55 units daily.   F/u in 6-8 weeks   Lyda Jester, PA-C  04/20/2020  

## 2020-04-20 NOTE — Progress Notes (Signed)
  Echocardiogram 2D Echocardiogram has been performed.  Shannon Lucas 04/20/2020, 1:53 PM

## 2020-04-20 NOTE — Patient Instructions (Signed)
It was great to see you today! No medication changes are needed at this time.  Labs today We will only contact you if something comes back abnormal or we need to make some changes. Otherwise no news is good news!  Your physician recommends that you schedule a follow-up appointment in: 3 months with Dr McLean  Do the following things EVERYDAY: 1) Weigh yourself in the morning before breakfast. Write it down and keep it in a log. 2) Take your medicines as prescribed 3) Eat low salt foods--Limit salt (sodium) to 2000 mg per day.  4) Stay as active as you can everyday 5) Limit all fluids for the day to less than 2 liters  At the [facility], you and your health needs are our priority. As part of our continuing mission to provide you with exceptional heart care, we have created designated Provider Care Teams. These Care Teams include your primary Cardiologist (physician) and Advanced Practice Providers (APPs- Physician Assistants and Nurse Practitioners) who all work together to provide you with the care you need, when you need it.   You may see any of the following providers on your designated Care Team at your next follow up: . Dr Daniel Bensimhon . Dr Dalton McLean . Amy Clegg, NP . Brittainy Simmons, PA . Lauren Kemp, PharmD   Please be sure to bring in all your medications bottles to every appointment.     

## 2020-04-21 ENCOUNTER — Telehealth (HOSPITAL_COMMUNITY): Payer: Self-pay | Admitting: Cardiology

## 2020-04-21 NOTE — Telephone Encounter (Signed)
-----   Message from Consuelo Pandy, [place] sent at 04/20/2020  5:59 PM EDT ----- Hi,  I saw this pt today and she noted that her glucose levels have been running high in the mid 200s. Recent admit in April and Lantus was cut from 55>>20. At time of admit, she was eating very little but PO intake has picked up. Please advise if she should go back to 55 units. Thanks

## 2020-04-21 NOTE — Telephone Encounter (Signed)
Attempted to contact patient LMOM .  

## 2020-04-27 ENCOUNTER — Ambulatory Visit (INDEPENDENT_AMBULATORY_CARE_PROVIDER_SITE_OTHER): Payer: Medicare Other | Admitting: *Deleted

## 2020-04-27 DIAGNOSIS — I495 Sick sinus syndrome: Secondary | ICD-10-CM

## 2020-04-27 LAB — CUP PACEART REMOTE DEVICE CHECK
Battery Remaining Longevity: 85 mo
Battery Voltage: 2.99 V
Brady Statistic AP VP Percent: 10.04 %
Brady Statistic AP VS Percent: 85.08 %
Brady Statistic AS VP Percent: 2.84 %
Brady Statistic AS VS Percent: 2.06 %
Brady Statistic RA Percent Paced: 94.63 %
Brady Statistic RV Percent Paced: 13.18 %
Date Time Interrogation Session: 20210614013928
Implantable Lead Implant Date: 20190107
Implantable Lead Implant Date: 20190107
Implantable Lead Location: 753859
Implantable Lead Location: 753860
Implantable Lead Model: 3830
Implantable Lead Model: 5076
Implantable Pulse Generator Implant Date: 20190107
Lead Channel Impedance Value: 285 Ohm
Lead Channel Impedance Value: 342 Ohm
Lead Channel Impedance Value: 361 Ohm
Lead Channel Impedance Value: 418 Ohm
Lead Channel Pacing Threshold Amplitude: 1 V
Lead Channel Pacing Threshold Pulse Width: 0.4 ms
Lead Channel Sensing Intrinsic Amplitude: 0.75 mV
Lead Channel Sensing Intrinsic Amplitude: 0.75 mV
Lead Channel Sensing Intrinsic Amplitude: 1.25 mV
Lead Channel Sensing Intrinsic Amplitude: 1.25 mV
Lead Channel Setting Pacing Amplitude: 2.5 V
Lead Channel Setting Pacing Amplitude: 3 V
Lead Channel Setting Pacing Pulse Width: 1 ms
Lead Channel Setting Sensing Sensitivity: 0.9 mV

## 2020-04-27 NOTE — Progress Notes (Signed)
Remote pacemaker transmission.   

## 2020-04-28 NOTE — Telephone Encounter (Signed)
[phone] Jerilynn Mages)  LMOM

## 2020-05-29 NOTE — Telephone Encounter (Signed)
Letter mailed

## 2020-06-05 ENCOUNTER — Other Ambulatory Visit (HOSPITAL_COMMUNITY): Payer: Self-pay | Admitting: Cardiology

## 2020-06-10 ENCOUNTER — Encounter (HOSPITAL_COMMUNITY): Payer: Self-pay | Admitting: Physician Assistant

## 2020-06-10 NOTE — Progress Notes (Signed)
Watchman Referral Patient Data   Shannon Lucas 86 y.o.   BMI: 34.8  Referring Physician: Dr Esmeralda Links Referring Contact Information: [facility]      Type of AF: Yes, Paroxysmal. First diagnosis of AF if known: 08/2017 Prior AF ablation? No     CHA2DS2-VASc Score = 9  The patient's score is based upon: CHF History: 1 HTN History: 1 Age : 2 Diabetes History: 1 Stroke History: 2 Vascular Disease History: 1 Gender: 1       HAS-BLED Score HTN      = 1 Abnormal renal/liver    = 1  Stroke     = 1 Bleeding history / disposition  = 1 Labile INR    = 0 Elderly (>65)    = 1 Drugs or EtOH   = 0  HAS-BLED Score = 5  Yearly bleeding risk: 0=1.13, 1=1.02, 2=1.88, 3=3.74, 4=8.70, 5+=12.5    Prior blood thinner history with any adverse event included:  She has had multiple falls in the last 2 months, 1 resulting in rib fracture and another w/ large rt upper arm hematoma.   Future bleeding risk (falls, thrombocytopenia, etc)     Prior Axial Chest Imaging (CT/MRI)? (If yes, type and when?)   Prior TEE? No   Most recent TTE  04/20/20 1. Left ventricular ejection fraction, by estimation, is 60 to 65%. The  left ventricle has normal function. The left ventricle has no regional  wall motion abnormalities. There is mild left ventricular hypertrophy.  Indeterminate diastolic filling due to  E-A fusion. Elevated left ventricular end-diastolic pressure.  2. Right ventricular systolic function is normal. The right ventricular  size is mildly enlarged. There is moderately elevated pulmonary artery  systolic pressure. The estimated right ventricular systolic pressure is  33.8 mmHg.  3. Left atrial size was moderately dilated.  4. Right atrial size was mildly dilated.  5. The mitral valve has mild mitral annular calcification. Trivial mitral  valve regurgitation. No evidence of mitral stenosis.  6. The aortic valve is abnormal. Aortic valve  regurgitation is not  visualized. Mild aortic valve stenosis. Aortic valve mean gradient  measures 14.0 mmHg.  7. The inferior vena cava is normal in size with greater than 50%  respiratory variability, suggesting right atrial pressure of 3 mmHg.   Comparison(s): A prior study was performed on 12/18/2018. Prior images  reviewed side by side. Images are visually similar, LVEF unchanged. Aortic  stenosis is now present and mild.    Patient's most recent Creatinine? Lab Results  Component Value Date   CREATININE 1.81 (H) 04/20/2020     Hx of ESRD on dialysis: No    Shannon Lucas [name] 06/10/20 10:22 AM

## 2020-06-15 ENCOUNTER — Other Ambulatory Visit: Payer: Self-pay

## 2020-06-15 ENCOUNTER — Ambulatory Visit (HOSPITAL_COMMUNITY)
Admission: RE | Admit: 2020-06-15 | Discharge: 2020-06-15 | Disposition: A | Discharge status: Home / Self Care | Payer: Medicare Other | Source: Ambulatory Visit | Attending: Cardiology | Admitting: Cardiology

## 2020-06-15 ENCOUNTER — Encounter (HOSPITAL_COMMUNITY): Payer: Self-pay | Admitting: Cardiology

## 2020-06-15 VITALS — BP 110/50 | HR 68 | Ht 65.0 in | Wt 201.8 lb

## 2020-06-15 DIAGNOSIS — I4892 Unspecified atrial flutter: Secondary | ICD-10-CM | POA: Insufficient documentation

## 2020-06-15 DIAGNOSIS — K219 Gastro-esophageal reflux disease without esophagitis: Secondary | ICD-10-CM | POA: Diagnosis not present

## 2020-06-15 DIAGNOSIS — Z794 Long term (current) use of insulin: Secondary | ICD-10-CM | POA: Insufficient documentation

## 2020-06-15 DIAGNOSIS — N183 Chronic kidney disease, stage 3 unspecified: Secondary | ICD-10-CM | POA: Diagnosis not present

## 2020-06-15 DIAGNOSIS — R0602 Shortness of breath: Secondary | ICD-10-CM | POA: Diagnosis not present

## 2020-06-15 DIAGNOSIS — I130 Hypertensive heart and chronic kidney disease with heart failure and stage 1 through stage 4 chronic kidney disease, or unspecified chronic kidney disease: Secondary | ICD-10-CM | POA: Diagnosis present

## 2020-06-15 DIAGNOSIS — I5042 Chronic combined systolic (congestive) and diastolic (congestive) heart failure: Secondary | ICD-10-CM | POA: Diagnosis not present

## 2020-06-15 DIAGNOSIS — Z87891 Personal history of nicotine dependence: Secondary | ICD-10-CM | POA: Diagnosis not present

## 2020-06-15 DIAGNOSIS — E1122 Type 2 diabetes mellitus with diabetic chronic kidney disease: Secondary | ICD-10-CM | POA: Insufficient documentation

## 2020-06-15 DIAGNOSIS — Z79890 Hormone replacement therapy (postmenopausal): Secondary | ICD-10-CM | POA: Diagnosis not present

## 2020-06-15 DIAGNOSIS — Z8673 Personal history of transient ischemic attack (TIA), and cerebral infarction without residual deficits: Secondary | ICD-10-CM | POA: Diagnosis not present

## 2020-06-15 DIAGNOSIS — G4733 Obstructive sleep apnea (adult) (pediatric): Secondary | ICD-10-CM | POA: Insufficient documentation

## 2020-06-15 DIAGNOSIS — I480 Paroxysmal atrial fibrillation: Secondary | ICD-10-CM

## 2020-06-15 DIAGNOSIS — Z950 Presence of cardiac pacemaker: Secondary | ICD-10-CM | POA: Insufficient documentation

## 2020-06-15 DIAGNOSIS — E785 Hyperlipidemia, unspecified: Secondary | ICD-10-CM | POA: Diagnosis not present

## 2020-06-15 DIAGNOSIS — D649 Anemia, unspecified: Secondary | ICD-10-CM | POA: Diagnosis not present

## 2020-06-15 DIAGNOSIS — Z79899 Other long term (current) drug therapy: Secondary | ICD-10-CM | POA: Diagnosis not present

## 2020-06-15 DIAGNOSIS — I2510 Atherosclerotic heart disease of native coronary artery without angina pectoris: Secondary | ICD-10-CM | POA: Insufficient documentation

## 2020-06-15 DIAGNOSIS — Z7901 Long term (current) use of anticoagulants: Secondary | ICD-10-CM | POA: Diagnosis not present

## 2020-06-15 DIAGNOSIS — I5032 Chronic diastolic (congestive) heart failure: Secondary | ICD-10-CM

## 2020-06-15 DIAGNOSIS — I5023 Acute on chronic systolic (congestive) heart failure: Secondary | ICD-10-CM

## 2020-06-15 DIAGNOSIS — I350 Nonrheumatic aortic (valve) stenosis: Secondary | ICD-10-CM | POA: Diagnosis not present

## 2020-06-15 LAB — COMPREHENSIVE METABOLIC PANEL
ALT: 17 U/L (ref 0–44)
AST: 18 U/L (ref 15–41)
Albumin: 3.5 g/dL (ref 3.5–5.0)
Alkaline Phosphatase: 98 U/L (ref 38–126)
Anion gap: 12 (ref 5–15)
BUN: 26 mg/dL — ABNORMAL HIGH (ref 8–23)
CO2: 23 mmol/L (ref 22–32)
Calcium: 9 mg/dL (ref 8.9–10.3)
Chloride: 105 mmol/L (ref 98–111)
Creatinine, Ser: 1.75 mg/dL — ABNORMAL HIGH (ref 0.44–1.00)
GFR calc Af Amer: 30 mL/min — ABNORMAL LOW (ref >60)
GFR calc non Af Amer: 26 mL/min — ABNORMAL LOW (ref >60)
Glucose, Bld: 172 mg/dL — ABNORMAL HIGH (ref 70–99)
Potassium: 3.5 mmol/L (ref 3.5–5.1)
Sodium: 140 mmol/L (ref 135–145)
Total Bilirubin: 1.1 mg/dL (ref 0.3–1.2)
Total Protein: 6.4 g/dL — ABNORMAL LOW (ref 6.5–8.1)

## 2020-06-15 LAB — TSH: TSH: 3.246 u[IU]/mL (ref 0.350–4.500)

## 2020-06-15 MED ORDER — HYDRALAZINE HCL 50 MG PO TABS: 75.0000 mg | ORAL_TABLET | Freq: Three times a day (TID) | ORAL | 3 refills | Status: DC

## 2020-06-15 NOTE — Progress Notes (Signed)
Patient Name: Shannon Lucas          DOB: Nov 03, 1931      Height:  5'5   Weight:  201lbs    Today's Date:  Date:   STOP BANG RISK ASSESSMENT S (snore) Have you been told that you snore?     YES   T (tired) Are you often tired, fatigued, or sleepy during the day?   YES  O (obstruction) Do you stop breathing, choke, or gasp during sleep? YES   P (pressure) Do you have or are you being treated for high blood pressure? YES   B (BMI) Is your body index greater than 35 kg/m? /NO   A (age) Are you 83 years old or older? YES   N (neck) Do you have a neck circumference greater than 16 inches?   NO   G (gender) Are you a female? /NO   TOTAL STOP/BANG "YES" ANSWERS 5                                                                       For Office Use Only              Procedure Order Form    YES to 3+ Stop Bang questions OR two clinical symptoms - patient qualifies for WatchPAT (CPT 95800)     Submit: This Form + Patient Face Sheet + Clinical Note via [facility] or Fax: [phone]         Clinical Notes: Will consult Sleep Specialist and refer for management of therapy due to patient increased risk of Sleep Apnea. Ordering a sleep study due to the following two clinical symptoms: Excessive daytime sleepiness G47.10 / Gastroesophageal reflux K21.9 / Nocturia R35.1 / Morning Headaches G44.221 / Difficulty concentrating R41.840 / Memory problems or poor judgment G31.84 / Personality changes or irritability R45.4 / Loud snoring R06.83 / Depression F32.9 / Unrefreshed by sleep G47.8 / Impotence N52.9 / History of high blood pressure R03.0 / Insomnia G47.00    I understand that I am proceeding with a home sleep apnea test as ordered by my treating physician. I understand that untreated sleep apnea is a serious cardiovascular risk factor and it is my responsibility to perform the test and seek management for sleep apnea. I will be contacted with the results and be managed for sleep apnea by a local  sleep physician. I will be receiving equipment and further instructions from [facility]. I shall promptly ship back the equipment via the included mailing label. I understand my insurance will be billed for the test and as the patient I am responsible for any insurance related out-of-pocket costs incurred. I have been provided with written instructions and can call for additional video or telephonic instruction, with 24-hour availability of qualified personnel to answer any questions: Patient Help Desk [phone].  Patient Signature ______________________________________________________   Date______________________ Patient Telemedicine Verbal Consent

## 2020-06-15 NOTE — Patient Instructions (Addendum)
Decrease Hydralazine to 75mg  three times daily.  Routine labs today. We will call with abnormal results. No news is good news.  Your provider requests you have a home sleep study.  (they will contact you to arrange)  Follow up in 4 months   Your provider has recommended that you have a home sleep study.  [employer] is the company that does these test.  They will contact you by phone and must speak with you before they can ship the equipment.  Once they have spoken with you they will send the equipment right to your home with instructions on how to set it up.  Once you have completed the test simply box all the equipment back up and mail back to the company.  IF you have any questions or issues with the equipment please call the company directly at [phone].  If your test is positive for sleep apnea and you need a home CPAP machine you will be contacted by Dr Theodosia Blender office [facility]) to set this up.

## 2020-06-16 NOTE — Progress Notes (Signed)
Advanced Heart Failure Clinic Note   Primary Care: [facility], [place] HF Cardiology: Dr. Aundra Dubin  HPI:  Shannon Lucas is a 86 y.o. female with PMH of Systolic CHF,  PAF on Xarelto, CKD stage III, DM II, GERD, HLD, CVA, anemia.   Admitted 4/17 -> 3/81/82 with A/C systolic CHF. L/RHC as below with normal coronaries and elevated filling pressures. Diuresed 5 lbs. Hospital course complicated by hematoma at radial cath site that improved overnight. Medications adjusted as tolerated. Discharge weight 205 lbs.    Pt seen in ED 03/08/17 for worsening of hematoma as above.  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 Dr. Donnetta Hutching saw in consult and repaired under local anaesthesia and sedation.   Echo in 10/18 showed EF 35-40% with diffuse hypokinesis, PASP 50 mmHg.  Holter in 10/18 showed runs of atrial fibrillation including afib with aberrancy.  Bradycardia into the 30s was also noted.  Decision was made to place PPM => patient got Medtronic device with His bundle lead and is now His pacing.    Echo in 2/20 showed EF up to 60-65%, mild LVH, normal RV.   She was admitted in 4/21 with fall, rib fractures, sepsis due to UTI, and CHF exacerbation. She was in and out of atrial fibrillation in the hospital.  She was diuresed.   Echo in 6/21 with EF 60-65%, normal RV, mild AS.   She presents today for followup of CHF.  Weight is down 4 lbs today. She walks with a walker.  No dyspnea walking in the house, but she gets short of breath walking longer distances.  She is short of breath with stairs. +orthopnea. No chest pain.  Poor balance with occasional falls, but not lightheaded.   ECG (personally reviewed): a-paced, poor RWP  Labs (04/06/17): K 5.0, Creatinine 2.04, BUN 43, LFTs normal Labs (6/18): K 3.9, creatinine 1.68 Labs (8/18): LDL 83, HDL 51, hgb 11.1, K 4.5, creatinine 1.48, LFTs normal, TSH normal.  Labs (1/19): K 4, creatinine 1.5, hgb 12.2, TSH normal, LFTs normal Labs (7/19): K 4.3, creatinine 1.78, LDL  105, TSH normal, LFTs normal Labs (1/20): K 3.9, creatinine 1.95 Labs (4/21): K 3.9, creatinine 1.86 Labs (5/21): LDL 64 Labs (6/21): K 4.1, creatinine 1.8, hgb 9.8  Review of systems complete and found to be negative unless listed in HPI.    PMH 1. Chronic systolic CHF: Nonischemic cardiomyopathy.  - Echo 03/01/17 LVEF 25-30%, At least mild AS, Mild MR, Mod LAE, Mod RAE, Trivial PI, PA peak pressure 42 mm Hg. - LHC (4/18) with nonobstructive CAD.  - Echo (10/18): EF 35-40%, diffuse hypokinesis, mild LVH, mild MR, mild AS, PASP 50 mmHg.  - Medtronic PPM with His bundle lead placed.  - Echo (2/20): EF 60-65%, mild LVH, normal RV size and systolic function.  - Echo (6/21): EF 60-65%, mild LVH, RV normal, PASP 47 mmHg, mild AS mean gradient 14 mmHg.  2. Atrial fibrillation: Paroxysmal. On Xarelto and amiodarone.  - Atypical atrial flutter noted 05/28/18.  3. CKD Stage III 4. DMII 5. GERD 6. HLD 7. Hx of CVA - ?2011-2012 with no lasting deficit 8. Chronic anemia 9. CAD: LHC (4/18) with 70% ostial D1, 50% ostial D2, 40% ostial OM1.  10. Aortic stenosis: Mild on echo in 6/21.   11. 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   Current Outpatient Medications  Medication Sig Dispense Refill   acetaminophen (TYLENOL) 500 MG tablet Take 1,000 mg by mouth every 6 (six) hours as needed for  moderate pain or headache.     amiodarone (PACERONE) 200 MG tablet TAKE 1 TABLET BY MOUTH DAIY 30 tablet 3   ergocalciferol (VITAMIN D2) 50000 units capsule Take 50,000 Units by mouth every Friday. At night     ferrous sulfate 325 (65 FE) MG tablet Take 1 tablet (325 mg total) by mouth 2 (two) times daily with a meal. 90 tablet 6   furosemide (LASIX) 20 MG tablet Take 2 tablets (40 mg total) by mouth daily. 60 tablet 1   ipratropium (ATROVENT HFA) 17 MCG/ACT inhaler Inhale 2 puffs into the lungs every 6 (six) hours as needed for wheezing. 1 Inhaler 12   isosorbide mononitrate (IMDUR) 30 MG 24 hr  tablet TAKE 1 TABLET BY MOUTH DAILY 90 tablet 3   LANTUS SOLOSTAR 100 UNIT/ML Solostar Pen Inject 20 Units into the skin daily. 15 mL 3   levothyroxine (SYNTHROID) 50 MCG tablet Take 50 mcg by mouth daily.     metoprolol succinate (TOPROL-XL) 100 MG 24 hr tablet TAKE 1 TABLET BY MOUTH DAILY IMMEDIATELYFOLLOWING A MEAL 90 tablet 3   oxyCODONE (OXY IR/ROXICODONE) 5 MG immediate release tablet Take 5 mg by mouth every 6 (six) hours as needed for pain.     polyethylene glycol powder (MIRALAX) 17 GM/SCOOP powder Take 17 g by mouth 2 (two) times daily as needed for moderate constipation. 255 g 0   pravastatin (PRAVACHOL) 40 MG tablet Take 1 tablet (40 mg total) by mouth at bedtime. 90 tablet 3   senna-docusate (SENOKOT-S) 8.6-50 MG tablet Take 1 tablet by mouth 2 (two) times daily as needed for moderate constipation.     venlafaxine (EFFEXOR) 75 MG tablet Take 75 mg by mouth daily.      XARELTO 15 MG TABS tablet TAKE 1 TABLET BY MOUTH DAILY WITH SUPPER 30 tablet 11   hydrALAZINE (APRESOLINE) 50 MG tablet Take 1.5 tablets (75 mg total) by mouth 3 (three) times daily. 135 tablet 3   No current facility-administered medications for this encounter.   No Known Allergies   Social History   Socioeconomic History   Marital status: Widowed    Spouse name: Not on file   Number of children: Not on file   Years of education: Not on file   Highest education level: Not on file  Occupational History   Not on file  Tobacco Use   Smoking status: Former Smoker   Smokeless tobacco: Never Used   Tobacco comment: 02/28/2017 "only smoked in the 1960s when we went out"  Vaping Use   Vaping Use: Never used  Substance and Sexual Activity   Alcohol use: No   Drug use: No   Sexual activity: Never  Other Topics Concern   Not on file  Social History Narrative   Not on file   Social Determinants of Health   Financial Resource Strain:    Difficulty of Paying Living Expenses:   Food  Insecurity:    Worried About Charity fundraiser in the Last Year:    Arboriculturist in the Last Year:   Transportation Needs:    Film/video editor (Medical):    Lack of Transportation (Non-Medical):   Physical Activity:    Days of Exercise per Week:    Minutes of Exercise per Session:   Stress:    Feeling of Stress :   Social Connections:    Frequency of Communication with Friends and Family:    Frequency of Social Gatherings with Friends and  Family:    Attends Religious Services:    Active Member of Clubs or Organizations:    Attends Music therapist:    Marital Status:   Intimate Partner Violence:    Fear of Current or Ex-Partner:    Emotionally Abused:    Physically Abused:    Sexually Abused:    Family history No family history of premature CAD or CHF.   Vitals:   06/15/20 1214  BP: (!) 110/50  Pulse: 68  SpO2: 98%  Weight: 91.5 kg (201 lb 12.8 oz)  Height: 5\' 5"  (1.651 m)     Wt Readings from Last 3 Encounters:  06/15/20 91.5 kg (201 lb 12.8 oz)  04/20/20 93.4 kg (205 lb 12.8 oz)  03/17/20 95.9 kg (211 lb 6.4 oz)    PHYSICAL EXAM: General: NAD Neck: No JVD, no thyromegaly or thyroid nodule.  Lungs: Clear to auscultation bilaterally with normal respiratory effort. CV: Nondisplaced PMI.  Heart regular S1/S2, no S3/S4, 1/6 SEM RUSB.  No peripheral edema.  No carotid bruit.  Normal pedal pulses.  Abdomen: Soft, nontender, no hepatosplenomegaly, no distention.  Skin: Intact without lesions or rashes.  Neurologic: Alert and oriented x 3.  Psych: Normal affect. Extremities: No clubbing or cyanosis.  HEENT: Normal.   ASSESSMENT & PLAN:  1. Chronic systolic=>diastolic HF:  Echo with EF 25-30% 02/2017, repeat echo 10/18 showed EF 35-40%. Nonischemic cardiomyopathy. Medtronic device with His bundle lead was placed.  Most recent echo in 6/21 showed EF up to 60-65% with normal RV.  NYHA III currently. She does not look particularly  volume overloaded on exam, suspect dyspnea may in large part be due to deconditioning. .  - Continue Lasix 40 mg daily.  BMET today.   - Continue Toprol XL 100 mg daily.  2. CKD: Stage III - BMET today.  3. PAF/atrial flutter: A-paced today.  - Continue amiodarone.  Check TSH, LFTs. She will need regular eye exams.   - Continue Xarelto.  4. CAD: Nonobstructive on 4/18 cath.     - Continue pravastatin, good lipids in 5/21.   - No ASA given stable CAD and Xarelto use.  5. Bradycardia: She is s/p Medtronic PPM with His bundle pacing.  6. HTN: BP is not elevated and she has had falls.  I will decrease hydralazine to 75 mg tid.  7. OSA: Daytime sleepiness.  - I will order home sleep study.   Followup 4 months.    Loralie Champagne 06/16/2020

## 2020-06-18 ENCOUNTER — Telehealth (HOSPITAL_COMMUNITY): Payer: Self-pay | Admitting: Cardiology

## 2020-06-18 NOTE — Telephone Encounter (Signed)
Order, OV note, stop bang and demographics all faxed to [facility] at [phone]  

## 2020-06-23 NOTE — Progress Notes (Deleted)
Watchman Consult Note   Date:  06/23/2020   ID:  Shannon Lucas, DOB 1931-01-11, MRN [id number]  PCP:  Manfred Shirts, PA  Cardiologist:  Dr Aundra Dubin Primary Electrophysiologist: Dr Curt Bears Referring Physician: Lyda Jester   CC: to discuss Watchman implant    History of Present Illness: Shannon Lucas is a 86 y.o. female referred by Dr Marigene Ehlers and Lyda Jester for evaluation of atrial fibrillation and stroke prevention. She has paroxysmal atrial fibrillation as well as chronic systolic heart failure, CKD3, DM2, GERD, HLD, CVA and anemia.  The patient has been evaluated by their referring physician and is felt to be a poor candidate for long term [place] due to falls complicated by personal injury (rib fracture and soft tissue hematoma.  She therefore presents today for Watchman evaluation.    Today, she denies symptoms of palpitations, chest pain, shortness of breath, orthopnea, PND, lower extremity edema, claudication, dizziness, presyncope, syncope, bleeding, or neurologic sequela. The patient is tolerating medications without difficulties and is otherwise without complaint today.    Past Medical History:  Diagnosis Date  . A-fib ([place])   . Arthritis    "a little bit qwhere" (02/28/2017)  . CHF (congestive heart failure) ([place])   . CKD (chronic kidney disease), stage III    Shannon Lucas 02/28/2017  . Depression   . GERD (gastroesophageal reflux disease)   . High cholesterol   . Hypertension   . Melanoma of back ([place])   . Myocardial infarction [facility])    "one dr said I'd had 1" (02/28/2017)  . Stroke [facility]) ~ 2010; ~2012   "affected the right side but I fully recovered; just a light one" (02/28/2017)  . Type II diabetes mellitus ([place])    Past Surgical History:  Procedure Laterality Date  . APPENDECTOMY    . FALSE ANEURYSM REPAIR Right 03/08/2017   Procedure: REPAIR RIGHT RADIAL FALSE ANEURYSM;  Surgeon: Rosetta Posner, MD;  Location: [facility];  Service: Vascular;  Laterality: Right;  . KNEE  SURGERY Left 1970s   "I have 1/3 of my knee left in there"  . LAPAROSCOPIC CHOLECYSTECTOMY    . MELANOMA EXCISION     "back"  . PACEMAKER IMPLANT N/A 11/20/2017   Procedure: PACEMAKER IMPLANT;  Surgeon: Constance Haw, MD;  Location: [facility] CV [facility];  Service: Cardiovascular;  Laterality: N/A;  . RIGHT/LEFT HEART CATH AND CORONARY ANGIOGRAPHY N/A 03/06/2017   Procedure: Right/Left Heart Cath and Coronary Angiography;  Surgeon: Larey Dresser, MD;  Location: [facility];  Service: Cardiovascular;  Laterality: N/A;  . TUBAL LIGATION       Current Outpatient Medications  Medication Sig Dispense Refill  . acetaminophen (TYLENOL) 500 MG tablet Take 1,000 mg by mouth every 6 (six) hours as needed for moderate pain or headache.    Marland Kitchen amiodarone (PACERONE) 200 MG tablet TAKE 1 TABLET BY MOUTH DAIY 30 tablet 3  . ergocalciferol (VITAMIN D2) 50000 units capsule Take 50,000 Units by mouth every Friday. At night    . ferrous sulfate 325 (65 FE) MG tablet Take 1 tablet (325 mg total) by mouth 2 (two) times daily with a meal. 90 tablet 6  . furosemide (LASIX) 20 MG tablet Take 2 tablets (40 mg total) by mouth daily. 60 tablet 1  . hydrALAZINE (APRESOLINE) 50 MG tablet Take 1.5 tablets (75 mg total) by mouth 3 (three) times daily. 135 tablet 3  . ipratropium (ATROVENT HFA) 17 MCG/ACT inhaler Inhale 2 puffs into the lungs every  6 (six) hours as needed for wheezing. 1 Inhaler 12  . isosorbide mononitrate (IMDUR) 30 MG 24 hr tablet TAKE 1 TABLET BY MOUTH DAILY 90 tablet 3  . LANTUS SOLOSTAR 100 UNIT/ML Solostar Pen Inject 20 Units into the skin daily. 15 mL 3  . levothyroxine (SYNTHROID) 50 MCG tablet Take 50 mcg by mouth daily.    . metoprolol succinate (TOPROL-XL) 100 MG 24 hr tablet TAKE 1 TABLET BY MOUTH DAILY IMMEDIATELYFOLLOWING A MEAL 90 tablet 3  . oxyCODONE (OXY IR/ROXICODONE) 5 MG immediate release tablet Take 5 mg by mouth every 6 (six) hours as needed for pain.    . polyethylene  glycol powder (MIRALAX) 17 GM/SCOOP powder Take 17 g by mouth 2 (two) times daily as needed for moderate constipation. 255 g 0  . pravastatin (PRAVACHOL) 40 MG tablet Take 1 tablet (40 mg total) by mouth at bedtime. 90 tablet 3  . senna-docusate (SENOKOT-S) 8.6-50 MG tablet Take 1 tablet by mouth 2 (two) times daily as needed for moderate constipation.    Marland Kitchen venlafaxine (EFFEXOR) 75 MG tablet Take 75 mg by mouth daily.     Shannon Lucas 15 MG TABS tablet TAKE 1 TABLET BY MOUTH DAILY WITH SUPPER 30 tablet 11   No current facility-administered medications for this visit.    Allergies:   Patient has no known allergies.   Social History:  The patient  reports that she has quit smoking. She has never used smokeless tobacco. She reports that she does not drink alcohol and does not use drugs.   Family History:  The patient's *** family history includes Diabetes in her mother; Parkinson's disease in her brother; Stroke in her father.    ROS:  Please see the history of present illness.   All other systems are reviewed and negative.    PHYSICAL EXAM: VS:  There were no vitals taken for this visit. , BMI There is no height or weight on file to calculate BMI. GEN: Well nourished, well developed, in no acute distress  HEENT: normal  Neck: no JVD, carotid bruits, or masses Cardiac: ***RRR; no murmurs, rubs, or gallops,no edema  Respiratory:  clear to auscultation bilaterally, normal work of breathing GI: soft, nontender, nondistended, + BS MS: no deformity or atrophy  Skin: warm and dry  Neuro:  Strength and sensation are intact Psych: euthymic mood, full affect  EKG:  EKG {ACTION; IS/IS GDJ:[id number]} ordered today. The ekg ordered today shows ***   Recent Labs: 03/03/2020: Magnesium 2.1 03/17/2020: B Natriuretic Peptide 381.2 04/20/2020: Hemoglobin 9.8; Platelets 186 06/15/2020: ALT 17; BUN 26; Creatinine, Ser 1.75; Potassium 3.5; Sodium 140; TSH 3.246    Lipid Panel     Component Value Date/Time    CHOL 143 03/17/2020 1533   TRIG 182 (H) 03/17/2020 1533   HDL 43 03/17/2020 1533   CHOLHDL 3.3 03/17/2020 1533   VLDL 36 03/17/2020 1533   LDLCALC 64 03/17/2020 1533     Wt Readings from Last 3 Encounters:  06/15/20 201 lb 12.8 oz (91.5 kg)  04/20/20 205 lb 12.8 oz (93.4 kg)  03/17/20 211 lb 6.4 oz (95.9 kg)      Other studies Reviewed: Additional studies/ records that were reviewed today include: ***  Review of the above records today demonstrates: ***   ASSESSMENT AND PLAN:  1.  Paroxysmal atrial fibrillation I have seen Bich Mchaney is a 86 y.o. female in the office today who has been referred by the Advanced HF clinic for a Watchman  left atrial appendage closure device.  She has a history of paroxysmal atrial fibrillation.  This patients CHA2DS2-VASc Score and unadjusted Ischemic Stroke Rate (% per year) is equal to 12.2 % stroke rate/year from a score of 9 which necessitates long term oral anticoagulation to prevent stroke. HasBled score is 5.  Unfortunately, She is not felt to be a long term anticoagulation candidate secondary to recurrent falls with injury.  The patients chart has been reviewed and I along with their referring cardiologist feel that they would be a candidate for short term oral anticoagulation.   Procedural risks for the Watchman implant have been reviewed with the patient including a 1% risk of stroke, <1% risk of perforation and device embolization.  Given the patient's poor candidacy for long-term oral anticoagulation, ability to tolerate short term oral anticoagulation, I have recommended the watchman left atrial appendage closure system.    Prior to the procedure, I would like to obtain a gated CT scan of the chest with contrast timed for PV/LA visualization.  Additionally, the patient will need ***.  Once these are performed I will review to confirm the patient continues to be a suitable candidate for Watchman implant. If so, we will schedule for the  implant procedure at the first available date.  Current medicines are reviewed at length with the patient today.   The patient {ACTIONS; HAS/DOES NOT HAVE:19233} concerns regarding her medicines.  The following changes were made today:  {NONE DEFAULTED:18576::"none"}  Labs/ tests ordered today include: *** No orders of the defined types were placed in this encounter.    Signed, Shannon Mage, MD 06/23/2020  6:42 PM     [place] [address] [phone] (office) [phone] (fax)

## 2020-06-24 ENCOUNTER — Institutional Professional Consult (permissible substitution): Payer: Medicare Other | Admitting: Cardiology

## 2020-06-30 ENCOUNTER — Telehealth (HOSPITAL_COMMUNITY): Payer: Self-pay | Admitting: Pharmacist

## 2020-06-30 MED ORDER — FUROSEMIDE 20 MG PO TABS: 40.0000 mg | ORAL_TABLET | Freq: Every day | ORAL | 11 refills | Status: DC

## 2020-06-30 NOTE — Telephone Encounter (Signed)
Furosemide refill sent to [facility] per patient request.   Audry Riles, PharmD, BCPS, BCCP, CPP Heart Failure Clinic Pharmacist [phone]

## 2020-07-08 NOTE — Telephone Encounter (Signed)
pts daughter called to report family has questions regarding home sleep study   [facility]

## 2020-07-09 NOTE — Telephone Encounter (Signed)
As no longer use [facility] equipment will not come in the mail, advised as soon as another sleep study at home comes available we would be in touch  Pt aware

## 2020-07-27 ENCOUNTER — Ambulatory Visit (INDEPENDENT_AMBULATORY_CARE_PROVIDER_SITE_OTHER): Payer: Medicare Other | Admitting: *Deleted

## 2020-07-27 DIAGNOSIS — I495 Sick sinus syndrome: Secondary | ICD-10-CM | POA: Diagnosis not present

## 2020-07-27 LAB — CUP PACEART REMOTE DEVICE CHECK
Battery Remaining Longevity: 81 mo
Battery Voltage: 2.98 V
Brady Statistic AP VP Percent: 11.75 %
Brady Statistic AP VS Percent: 88.07 %
Brady Statistic AS VP Percent: 0.02 %
Brady Statistic AS VS Percent: 0.16 %
Brady Statistic RA Percent Paced: 99.91 %
Brady Statistic RV Percent Paced: 11.77 %
Date Time Interrogation Session: 20210913010559
Implantable Lead Implant Date: 20190107
Implantable Lead Implant Date: 20190107
Implantable Lead Location: 753859
Implantable Lead Location: 753860
Implantable Lead Model: 3830
Implantable Lead Model: 5076
Implantable Pulse Generator Implant Date: 20190107
Lead Channel Impedance Value: 266 Ohm
Lead Channel Impedance Value: 323 Ohm
Lead Channel Impedance Value: 342 Ohm
Lead Channel Impedance Value: 399 Ohm
Lead Channel Pacing Threshold Amplitude: 1 V
Lead Channel Pacing Threshold Pulse Width: 0.4 ms
Lead Channel Sensing Intrinsic Amplitude: 1.25 mV
Lead Channel Sensing Intrinsic Amplitude: 1.25 mV
Lead Channel Sensing Intrinsic Amplitude: 1.375 mV
Lead Channel Sensing Intrinsic Amplitude: 1.375 mV
Lead Channel Setting Pacing Amplitude: 2.5 V
Lead Channel Setting Pacing Amplitude: 3 V
Lead Channel Setting Pacing Pulse Width: 1 ms
Lead Channel Setting Sensing Sensitivity: 0.9 mV

## 2020-07-28 ENCOUNTER — Encounter (HOSPITAL_COMMUNITY): Payer: Medicare Other | Admitting: Cardiology

## 2020-07-29 NOTE — Progress Notes (Signed)
Remote pacemaker transmission.   

## 2020-07-30 ENCOUNTER — Inpatient Hospital Stay (HOSPITAL_COMMUNITY): Admission: RE | Admit: 2020-07-30 | Payer: Medicare Other | Source: Ambulatory Visit | Admitting: Internal Medicine

## 2020-10-16 ENCOUNTER — Ambulatory Visit (HOSPITAL_COMMUNITY)
Admission: RE | Admit: 2020-10-16 | Discharge: 2020-10-16 | Disposition: A | Discharge status: Home / Self Care | Payer: Medicare Other | Source: Ambulatory Visit | Attending: Cardiology | Admitting: Cardiology

## 2020-10-16 ENCOUNTER — Encounter (HOSPITAL_COMMUNITY): Payer: Self-pay | Admitting: Cardiology

## 2020-10-16 ENCOUNTER — Other Ambulatory Visit: Payer: Self-pay

## 2020-10-16 VITALS — BP 160/70 | HR 72 | Ht 65.0 in | Wt 197.4 lb

## 2020-10-16 DIAGNOSIS — I5022 Chronic systolic (congestive) heart failure: Secondary | ICD-10-CM | POA: Diagnosis not present

## 2020-10-16 DIAGNOSIS — I130 Hypertensive heart and chronic kidney disease with heart failure and stage 1 through stage 4 chronic kidney disease, or unspecified chronic kidney disease: Secondary | ICD-10-CM | POA: Insufficient documentation

## 2020-10-16 DIAGNOSIS — E785 Hyperlipidemia, unspecified: Secondary | ICD-10-CM | POA: Insufficient documentation

## 2020-10-16 DIAGNOSIS — I428 Other cardiomyopathies: Secondary | ICD-10-CM | POA: Insufficient documentation

## 2020-10-16 DIAGNOSIS — I440 Atrioventricular block, first degree: Secondary | ICD-10-CM | POA: Diagnosis not present

## 2020-10-16 DIAGNOSIS — I5042 Chronic combined systolic (congestive) and diastolic (congestive) heart failure: Secondary | ICD-10-CM | POA: Diagnosis not present

## 2020-10-16 DIAGNOSIS — Z79899 Other long term (current) drug therapy: Secondary | ICD-10-CM | POA: Diagnosis not present

## 2020-10-16 DIAGNOSIS — Z950 Presence of cardiac pacemaker: Secondary | ICD-10-CM | POA: Diagnosis not present

## 2020-10-16 DIAGNOSIS — I2510 Atherosclerotic heart disease of native coronary artery without angina pectoris: Secondary | ICD-10-CM | POA: Diagnosis not present

## 2020-10-16 DIAGNOSIS — Z7901 Long term (current) use of anticoagulants: Secondary | ICD-10-CM | POA: Diagnosis not present

## 2020-10-16 DIAGNOSIS — K219 Gastro-esophageal reflux disease without esophagitis: Secondary | ICD-10-CM | POA: Insufficient documentation

## 2020-10-16 DIAGNOSIS — E1122 Type 2 diabetes mellitus with diabetic chronic kidney disease: Secondary | ICD-10-CM | POA: Insufficient documentation

## 2020-10-16 DIAGNOSIS — Z87891 Personal history of nicotine dependence: Secondary | ICD-10-CM | POA: Insufficient documentation

## 2020-10-16 DIAGNOSIS — I4892 Unspecified atrial flutter: Secondary | ICD-10-CM | POA: Insufficient documentation

## 2020-10-16 DIAGNOSIS — N183 Chronic kidney disease, stage 3 unspecified: Secondary | ICD-10-CM | POA: Diagnosis not present

## 2020-10-16 DIAGNOSIS — I480 Paroxysmal atrial fibrillation: Secondary | ICD-10-CM | POA: Diagnosis not present

## 2020-10-16 LAB — COMPREHENSIVE METABOLIC PANEL
ALT: 19 U/L (ref 0–44)
AST: 21 U/L (ref 15–41)
Albumin: 3.7 g/dL (ref 3.5–5.0)
Alkaline Phosphatase: 101 U/L (ref 38–126)
Anion gap: 11 (ref 5–15)
BUN: 30 mg/dL — ABNORMAL HIGH (ref 8–23)
CO2: 23 mmol/L (ref 22–32)
Calcium: 9.4 mg/dL (ref 8.9–10.3)
Chloride: 106 mmol/L (ref 98–111)
Creatinine, Ser: 2.27 mg/dL — ABNORMAL HIGH (ref 0.44–1.00)
GFR, Estimated: 20 mL/min — ABNORMAL LOW (ref >60)
Glucose, Bld: 136 mg/dL — ABNORMAL HIGH (ref 70–99)
Potassium: 3.6 mmol/L (ref 3.5–5.1)
Sodium: 140 mmol/L (ref 135–145)
Total Bilirubin: 0.6 mg/dL (ref 0.3–1.2)
Total Protein: 6.6 g/dL (ref 6.5–8.1)

## 2020-10-16 LAB — CBC
HCT: 35.3 % — ABNORMAL LOW (ref 36.0–46.0)
Hemoglobin: 11.3 g/dL — ABNORMAL LOW (ref 12.0–15.0)
MCH: 30.4 pg (ref 26.0–34.0)
MCHC: 32 g/dL (ref 30.0–36.0)
MCV: 94.9 fL (ref 80.0–100.0)
Platelets: 215 10*3/uL (ref 150–400)
RBC: 3.72 MIL/uL — ABNORMAL LOW (ref 3.87–5.11)
RDW: 15.9 % — ABNORMAL HIGH (ref 11.5–15.5)
WBC: 7.1 10*3/uL (ref 4.0–10.5)
nRBC: 0 % (ref 0.0–0.2)

## 2020-10-16 LAB — TSH: TSH: 3.531 u[IU]/mL (ref 0.350–4.500)

## 2020-10-16 NOTE — Patient Instructions (Addendum)
Labs done today, we will call you for abnormal results  Please call our office in March 2022 to schedule your follow up appointment  If you have any questions or concerns before your next appointment please send [place] a message through [place] or call our office at [phone].    TO LEAVE A MESSAGE FOR THE NURSE SELECT OPTION 2, PLEASE LEAVE A MESSAGE INCLUDING:  YOUR NAME  DATE OF BIRTH  CALL BACK NUMBER  REASON FOR CALL**this is important as we prioritize the call backs  YOU WILL RECEIVE A CALL BACK THE SAME DAY AS LONG AS YOU CALL BEFORE 4:00 PM  At the [facility], you and your health needs are our priority. As part of our continuing mission to provide you with exceptional heart care, we have created designated Provider Care Teams. These Care Teams include your primary Cardiologist (physician) and Advanced Practice Providers (APPs- Physician Assistants and Nurse Practitioners) who all work together to provide you with the care you need, when you need it.   You may see any of the following providers on your designated Care Team at your next follow up:  Dr Glori Bickers  Dr Haynes Kerns, NP  Lyda Jester, [place]  Audry Riles, PharmD   Please be sure to bring in all your medications bottles to every appointment.

## 2020-10-18 NOTE — Progress Notes (Signed)
Advanced Heart Failure Clinic Note   Primary Care: [facility], [place] HF Cardiology: Dr. Aundra Dubin  HPI:  Shannon Lucas is a 86 y.o. female with PMH of Systolic CHF,  PAF on Xarelto, CKD stage III, DM II, GERD, HLD, CVA, anemia.   Admitted 4/17 -> 9/32/67 with A/C systolic CHF. L/RHC as below with normal coronaries and elevated filling pressures. Diuresed 5 lbs. Hospital course complicated by hematoma at radial cath site that improved overnight. Medications adjusted as tolerated. Discharge weight 205 lbs.    Pt seen in ED 03/08/17 for worsening of hematoma as above.  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 Dr. Donnetta Hutching saw in consult and repaired under local anaesthesia and sedation.   Echo in 10/18 showed EF 35-40% with diffuse hypokinesis, PASP 50 mmHg.  Holter in 10/18 showed runs of atrial fibrillation including afib with aberrancy.  Bradycardia into the 30s was also noted.  Decision was made to place PPM => patient got Medtronic device with His bundle lead and is now His pacing.    Echo in 2/20 showed EF up to 60-65%, mild LVH, normal RV.   She was admitted in 4/21 with fall, rib fractures, sepsis due to UTI, and CHF exacerbation. She was in and out of atrial fibrillation in the hospital.  She was diuresed.   Echo in 6/21 with EF 60-65%, normal RV, mild AS.   She presents today for followup of CHF.  Poor balance, has tripped and fallen but no severe injury.  She is walking with a walker, thinks she could go up to a third of a mile without dyspnea but would be fatigued.  No chest pain. BP high today, normally SBP in 120s at other appts and at home.  No orthopnea/PND.   Labs (04/06/17): K 5.0, Creatinine 2.04, BUN 43, LFTs normal Labs (6/18): K 3.9, creatinine 1.68 Labs (8/18): LDL 83, HDL 51, hgb 11.1, K 4.5, creatinine 1.48, LFTs normal, TSH normal.  Labs (1/19): K 4, creatinine 1.5, hgb 12.2, TSH normal, LFTs normal Labs (7/19): K 4.3, creatinine 1.78, LDL 105, TSH normal, LFTs normal Labs (1/20):  K 3.9, creatinine 1.95 Labs (4/21): K 3.9, creatinine 1.86 Labs (5/21): LDL 64 Labs (6/21): K 4.1, creatinine 1.8, hgb 9.8 Labs (8/21): K 3.5, creatinine 1.75  ECG (personally reviewed): A-paced with long 1st degree AVB  Review of systems complete and found to be negative unless listed in HPI.    PMH 1. Chronic systolic CHF: Nonischemic cardiomyopathy.  - Echo 03/01/17 LVEF 25-30%, At least mild AS, Mild MR, Mod LAE, Mod RAE, Trivial PI, PA peak pressure 42 mm Hg. - LHC (4/18) with nonobstructive CAD.  - Echo (10/18): EF 35-40%, diffuse hypokinesis, mild LVH, mild MR, mild AS, PASP 50 mmHg.  - Medtronic PPM with His bundle lead placed.  - Echo (2/20): EF 60-65%, mild LVH, normal RV size and systolic function.  - Echo (6/21): EF 60-65%, mild LVH, RV normal, PASP 47 mmHg, mild AS mean gradient 14 mmHg.  2. Atrial fibrillation: Paroxysmal. On Xarelto and amiodarone.  - Atypical atrial flutter noted 05/28/18.  3. CKD Stage III 4. DMII 5. GERD 6. HLD 7. Hx of CVA - ?2011-2012 with no lasting deficit 8. Chronic anemia 9. CAD: LHC (4/18) with 70% ostial D1, 50% ostial D2, 40% ostial OM1.  10. Aortic stenosis: Mild on echo in 6/21.   11. 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   Current Outpatient Medications  Medication Sig Dispense Refill  . acetaminophen (TYLENOL)  500 MG tablet Take 1,000 mg by mouth every 6 (six) hours as needed for moderate pain or headache.    Marland Kitchen amiodarone (PACERONE) 200 MG tablet TAKE 1 TABLET BY MOUTH DAIY 30 tablet 3  . ergocalciferol (VITAMIN D2) 50000 units capsule Take 50,000 Units by mouth every Friday. At night    . ferrous sulfate 325 (65 FE) MG tablet Take 1 tablet (325 mg total) by mouth 2 (two) times daily with a meal. 90 tablet 6  . furosemide (LASIX) 20 MG tablet Take 2 tablets (40 mg total) by mouth daily. 60 tablet 11  . hydrALAZINE (APRESOLINE) 50 MG tablet Take 1.5 tablets (75 mg total) by mouth 3 (three) times daily. 135 tablet 3  .  ipratropium (ATROVENT HFA) 17 MCG/ACT inhaler Inhale 2 puffs into the lungs every 6 (six) hours as needed for wheezing. 1 Inhaler 12  . isosorbide mononitrate (IMDUR) 30 MG 24 hr tablet TAKE 1 TABLET BY MOUTH DAILY 90 tablet 3  . LANTUS SOLOSTAR 100 UNIT/ML Solostar Pen Inject 20 Units into the skin daily. 15 mL 3  . levothyroxine (SYNTHROID) 50 MCG tablet Take 50 mcg by mouth daily.    . metoprolol succinate (TOPROL-XL) 100 MG 24 hr tablet TAKE 1 TABLET BY MOUTH DAILY IMMEDIATELYFOLLOWING A MEAL 90 tablet 3  . polyethylene glycol powder (MIRALAX) 17 GM/SCOOP powder Take 17 g by mouth 2 (two) times daily as needed for moderate constipation. 255 g 0  . pravastatin (PRAVACHOL) 40 MG tablet Take 1 tablet (40 mg total) by mouth at bedtime. 90 tablet 3  . senna-docusate (SENOKOT-S) 8.6-50 MG tablet Take 1 tablet by mouth 2 (two) times daily as needed for moderate constipation.    Marland Kitchen venlafaxine (EFFEXOR) 75 MG tablet Take 75 mg by mouth daily.     [name] 15 MG TABS tablet TAKE 1 TABLET BY MOUTH DAILY WITH SUPPER 30 tablet 11   No current facility-administered medications for this encounter.   No Known Allergies   Social History   Socioeconomic History  . Marital status: Widowed    Spouse name: Not on file  . Number of children: Not on file  . Years of education: Not on file  . Highest education level: Not on file  Occupational History  . Not on file  Tobacco Use  . Smoking status: Former Research scientist (life sciences)  . Smokeless tobacco: Never Used  . Tobacco comment: 02/28/2017 "only smoked in the 1960s when we went out"  Vaping Use  . Vaping Use: Never used  Substance and Sexual Activity  . Alcohol use: No  . Drug use: No  . Sexual activity: Never  Other Topics Concern  . Not on file  Social History Narrative  . Not on file   Social Determinants of Health   Financial Resource Strain:   . Difficulty of Paying Living Expenses: Not on file  Food Insecurity:   . Worried About Charity fundraiser in  the Last Year: Not on file  . Ran Out of Food in the Last Year: Not on file  Transportation Needs:   . Lack of Transportation (Medical): Not on file  . Lack of Transportation (Non-Medical): Not on file  Physical Activity:   . Days of Exercise per Week: Not on file  . Minutes of Exercise per Session: Not on file  Stress:   . Feeling of Stress : Not on file  Social Connections:   . Frequency of Communication with Friends and Family: Not on file  .  Frequency of Social Gatherings with Friends and Family: Not on file  . Attends Religious Services: Not on file  . Active Member of Clubs or Organizations: Not on file  . Attends Archivist Meetings: Not on file  . Marital Status: Not on file  Intimate Partner Violence:   . Fear of Current or Ex-Partner: Not on file  . Emotionally Abused: Not on file  . Physically Abused: Not on file  . Sexually Abused: Not on file   Family history No family history of premature CAD or CHF.   Vitals:   10/16/20 1415  BP: (!) 160/70  Pulse: 72  SpO2: 99%  Weight: 89.5 kg (197 lb 6.4 oz)  Height: 5\' 5"  (1.651 m)     Wt Readings from Last 3 Encounters:  10/16/20 89.5 kg (197 lb 6.4 oz)  06/15/20 91.5 kg (201 lb 12.8 oz)  04/20/20 93.4 kg (205 lb 12.8 oz)    PHYSICAL EXAM: General: NAD Neck: JVP 8 cm, no thyromegaly or thyroid nodule.  Lungs: Clear to auscultation bilaterally with normal respiratory effort. CV: Nondisplaced PMI.  Heart regular S1/S2, no S3/S4, no murmur.  1+ chronic left ankle edema.  No carotid bruit.  Normal pedal pulses.  Abdomen: Soft, nontender, no hepatosplenomegaly, no distention.  Skin: Intact without lesions or rashes.  Neurologic: Alert and oriented x 3.  Psych: Normal affect. Extremities: No clubbing or cyanosis.  HEENT: Normal.   ASSESSMENT & PLAN:  1. Chronic systolic=>diastolic HF:  Echo with EF 25-30% 02/2017, repeat echo 10/18 showed EF 35-40%. Nonischemic cardiomyopathy. Medtronic device with His  bundle lead was placed.  Most recent echo in 6/21 showed EF up to 60-65% with normal RV.  NYHA II currently. She does not look volume overloaded on exam.  - Continue Lasix 40 mg daily.  BMET today.   - Continue Toprol XL 100 mg daily.  - Continue hydralazine/Imdur.  2. CKD: Stage III - BMET today.  3. PAF/atrial flutter: A-paced today with long 1st degree AVB.  - Continue amiodarone.  Check TSH and LFTs. She will need regular eye exams.   - Continue Xarelto.  4. CAD: Nonobstructive on 4/18 cath.     - Continue pravastatin, good lipids in 5/21.   - No ASA given stable CAD and Xarelto use.  5. Bradycardia: She is s/p Medtronic PPM with His bundle pacing, currently just a-pacing.  6. HTN: BP is high today but generally does not run high.  Followup 4 months.    Loralie Champagne 10/18/2020

## 2020-10-21 ENCOUNTER — Telehealth (HOSPITAL_COMMUNITY): Payer: Self-pay

## 2020-10-21 MED ORDER — FUROSEMIDE 20 MG PO TABS: 20.0000 mg | ORAL_TABLET | Freq: Every day | ORAL | 11 refills | Status: DC

## 2020-10-21 NOTE — Telephone Encounter (Signed)
Pt aware of results and recommendations to cut back on sodium and decrease lasix to 20mg  daily. Verbalized understanding.

## 2020-10-21 NOTE — Telephone Encounter (Signed)
-----   Message from Larey Dresser, MD sent at 10/16/2020  5:24 PM EST ----- Creatinine higher.  Would make sure to keep sodium intake low and cut back on Lasix to 20 mg daily.

## 2020-10-26 ENCOUNTER — Ambulatory Visit (INDEPENDENT_AMBULATORY_CARE_PROVIDER_SITE_OTHER): Payer: Medicare Other

## 2020-10-26 DIAGNOSIS — I495 Sick sinus syndrome: Secondary | ICD-10-CM | POA: Diagnosis not present

## 2020-10-26 LAB — CUP PACEART REMOTE DEVICE CHECK
Battery Remaining Longevity: 81 mo
Battery Voltage: 2.99 V
Brady Statistic AP VP Percent: 8.19 %
Brady Statistic AP VS Percent: 91.12 %
Brady Statistic AS VP Percent: 0.2 %
Brady Statistic AS VS Percent: 0.5 %
Brady Statistic RA Percent Paced: 99.33 %
Brady Statistic RV Percent Paced: 8.49 %
Date Time Interrogation Session: 20211213000608
Implantable Lead Implant Date: 20190107
Implantable Lead Implant Date: 20190107
Implantable Lead Location: 753859
Implantable Lead Location: 753860
Implantable Lead Model: 3830
Implantable Lead Model: 5076
Implantable Pulse Generator Implant Date: 20190107
Lead Channel Impedance Value: 266 Ohm
Lead Channel Impedance Value: 342 Ohm
Lead Channel Impedance Value: 361 Ohm
Lead Channel Impedance Value: 418 Ohm
Lead Channel Pacing Threshold Amplitude: 1 V
Lead Channel Pacing Threshold Pulse Width: 0.4 ms
Lead Channel Sensing Intrinsic Amplitude: 1 mV
Lead Channel Sensing Intrinsic Amplitude: 1 mV
Lead Channel Sensing Intrinsic Amplitude: 1.875 mV
Lead Channel Sensing Intrinsic Amplitude: 1.875 mV
Lead Channel Setting Pacing Amplitude: 2.5 V
Lead Channel Setting Pacing Amplitude: 3 V
Lead Channel Setting Pacing Pulse Width: 1 ms
Lead Channel Setting Sensing Sensitivity: 0.9 mV

## 2020-11-05 NOTE — Progress Notes (Signed)
Remote pacemaker transmission.   

## 2021-01-25 ENCOUNTER — Ambulatory Visit (INDEPENDENT_AMBULATORY_CARE_PROVIDER_SITE_OTHER): Payer: Medicare Other

## 2021-01-25 DIAGNOSIS — I495 Sick sinus syndrome: Secondary | ICD-10-CM

## 2021-01-25 LAB — CUP PACEART REMOTE DEVICE CHECK
Battery Remaining Longevity: 82 mo
Battery Voltage: 2.99 V
Brady Statistic AP VP Percent: 2.23 %
Brady Statistic AP VS Percent: 97.67 %
Brady Statistic AS VP Percent: 0 %
Brady Statistic AS VS Percent: 0.1 %
Brady Statistic RA Percent Paced: 99.94 %
Brady Statistic RV Percent Paced: 2.23 %
Date Time Interrogation Session: 20220314010545
Implantable Lead Implant Date: 20190107
Implantable Lead Implant Date: 20190107
Implantable Lead Location: 753859
Implantable Lead Location: 753860
Implantable Lead Model: 3830
Implantable Lead Model: 5076
Implantable Pulse Generator Implant Date: 20190107
Lead Channel Impedance Value: 266 Ohm
Lead Channel Impedance Value: 323 Ohm
Lead Channel Impedance Value: 342 Ohm
Lead Channel Impedance Value: 399 Ohm
Lead Channel Pacing Threshold Amplitude: 0.875 V
Lead Channel Pacing Threshold Pulse Width: 0.4 ms
Lead Channel Sensing Intrinsic Amplitude: 1.625 mV
Lead Channel Sensing Intrinsic Amplitude: 1.625 mV
Lead Channel Sensing Intrinsic Amplitude: 1.875 mV
Lead Channel Sensing Intrinsic Amplitude: 1.875 mV
Lead Channel Setting Pacing Amplitude: 2.25 V
Lead Channel Setting Pacing Amplitude: 3 V
Lead Channel Setting Pacing Pulse Width: 1 ms
Lead Channel Setting Sensing Sensitivity: 0.9 mV

## 2021-01-31 ENCOUNTER — Encounter (HOSPITAL_COMMUNITY): Payer: Self-pay | Admitting: Emergency Medicine

## 2021-01-31 ENCOUNTER — Emergency Department (HOSPITAL_COMMUNITY): Payer: Medicare Other

## 2021-01-31 ENCOUNTER — Observation Stay (HOSPITAL_COMMUNITY)
Admission: EM | Admit: 2021-01-31 | Discharge: 2021-02-01 | Disposition: A | Discharge status: Home / Self Care | Payer: Medicare Other | Attending: Internal Medicine | Admitting: Internal Medicine

## 2021-01-31 ENCOUNTER — Other Ambulatory Visit: Payer: Self-pay

## 2021-01-31 DIAGNOSIS — Z8673 Personal history of transient ischemic attack (TIA), and cerebral infarction without residual deficits: Secondary | ICD-10-CM | POA: Diagnosis not present

## 2021-01-31 DIAGNOSIS — Z7901 Long term (current) use of anticoagulants: Secondary | ICD-10-CM | POA: Diagnosis not present

## 2021-01-31 DIAGNOSIS — Z20822 Contact with and (suspected) exposure to covid-19: Secondary | ICD-10-CM | POA: Insufficient documentation

## 2021-01-31 DIAGNOSIS — Z794 Long term (current) use of insulin: Secondary | ICD-10-CM | POA: Insufficient documentation

## 2021-01-31 DIAGNOSIS — J320 Chronic maxillary sinusitis: Secondary | ICD-10-CM

## 2021-01-31 DIAGNOSIS — Z79890 Hormone replacement therapy (postmenopausal): Secondary | ICD-10-CM | POA: Diagnosis not present

## 2021-01-31 DIAGNOSIS — Z9181 History of falling: Secondary | ICD-10-CM | POA: Diagnosis not present

## 2021-01-31 DIAGNOSIS — I130 Hypertensive heart and chronic kidney disease with heart failure and stage 1 through stage 4 chronic kidney disease, or unspecified chronic kidney disease: Secondary | ICD-10-CM | POA: Diagnosis not present

## 2021-01-31 DIAGNOSIS — Z87891 Personal history of nicotine dependence: Secondary | ICD-10-CM | POA: Diagnosis not present

## 2021-01-31 DIAGNOSIS — G9340 Encephalopathy, unspecified: Secondary | ICD-10-CM

## 2021-01-31 DIAGNOSIS — E1122 Type 2 diabetes mellitus with diabetic chronic kidney disease: Secondary | ICD-10-CM | POA: Insufficient documentation

## 2021-01-31 DIAGNOSIS — I5023 Acute on chronic systolic (congestive) heart failure: Secondary | ICD-10-CM | POA: Diagnosis not present

## 2021-01-31 DIAGNOSIS — R443 Hallucinations, unspecified: Secondary | ICD-10-CM

## 2021-01-31 DIAGNOSIS — Z950 Presence of cardiac pacemaker: Secondary | ICD-10-CM | POA: Diagnosis not present

## 2021-01-31 DIAGNOSIS — N183 Chronic kidney disease, stage 3 unspecified: Secondary | ICD-10-CM | POA: Insufficient documentation

## 2021-01-31 DIAGNOSIS — Z79899 Other long term (current) drug therapy: Secondary | ICD-10-CM | POA: Insufficient documentation

## 2021-01-31 DIAGNOSIS — R410 Disorientation, unspecified: Secondary | ICD-10-CM | POA: Insufficient documentation

## 2021-01-31 DIAGNOSIS — R4182 Altered mental status, unspecified: Secondary | ICD-10-CM | POA: Diagnosis present

## 2021-01-31 LAB — COMPREHENSIVE METABOLIC PANEL
ALT: 18 U/L (ref 0–44)
AST: 21 U/L (ref 15–41)
Albumin: 3.4 g/dL — ABNORMAL LOW (ref 3.5–5.0)
Alkaline Phosphatase: 98 U/L (ref 38–126)
Anion gap: 7 (ref 5–15)
BUN: 22 mg/dL (ref 8–23)
CO2: 25 mmol/L (ref 22–32)
Calcium: 9.1 mg/dL (ref 8.9–10.3)
Chloride: 104 mmol/L (ref 98–111)
Creatinine, Ser: 1.57 mg/dL — ABNORMAL HIGH (ref 0.44–1.00)
GFR, Estimated: 32 mL/min — ABNORMAL LOW (ref >60)
Glucose, Bld: 117 mg/dL — ABNORMAL HIGH (ref 70–99)
Potassium: 4 mmol/L (ref 3.5–5.1)
Sodium: 136 mmol/L (ref 135–145)
Total Bilirubin: 1 mg/dL (ref 0.3–1.2)
Total Protein: 6.2 g/dL — ABNORMAL LOW (ref 6.5–8.1)

## 2021-01-31 LAB — URINALYSIS, ROUTINE W REFLEX MICROSCOPIC
Bilirubin Urine: NEGATIVE
Bilirubin Urine: NEGATIVE
Glucose, UA: NEGATIVE mg/dL
Glucose, UA: NEGATIVE mg/dL
Hgb urine dipstick: NEGATIVE
Hgb urine dipstick: NEGATIVE
Ketones, ur: NEGATIVE mg/dL
Ketones, ur: NEGATIVE mg/dL
Leukocytes,Ua: NEGATIVE
Nitrite: NEGATIVE
Nitrite: NEGATIVE
Protein, ur: 100 mg/dL — AB
Protein, ur: NEGATIVE mg/dL
Specific Gravity, Urine: 1.015 (ref 1.005–1.030)
Specific Gravity, Urine: 1.008 (ref 1.005–1.030)
pH: 6 (ref 5.0–8.0)
pH: 6 (ref 5.0–8.0)

## 2021-01-31 LAB — CBG MONITORING, ED
Glucose-Capillary: 93 mg/dL (ref 70–99)
Glucose-Capillary: 93 mg/dL (ref 70–99)
Glucose-Capillary: 204 mg/dL — ABNORMAL HIGH (ref 70–99)

## 2021-01-31 LAB — I-STAT VENOUS BLOOD GAS, ED
Acid-base deficit: 1 mmol/L (ref 0.0–2.0)
Bicarbonate: 25.4 mmol/L (ref 20.0–28.0)
Calcium, Ion: 1.28 mmol/L (ref 1.15–1.40)
HCT: 36 % (ref 36.0–46.0)
Hemoglobin: 12.2 g/dL (ref 12.0–15.0)
O2 Saturation: 79 %
Potassium: 3.9 mmol/L (ref 3.5–5.1)
Sodium: 141 mmol/L (ref 135–145)
TCO2: 27 mmol/L (ref 22–32)
pCO2, Ven: 48.3 mmHg (ref 44.0–60.0)
pH, Ven: 7.329 (ref 7.250–7.430)
pO2, Ven: 47 mmHg — ABNORMAL HIGH (ref 32.0–45.0)

## 2021-01-31 LAB — AMMONIA: Ammonia: 11 umol/L (ref 9–35)

## 2021-01-31 LAB — RESP PANEL BY RT-PCR (FLU A&B, COVID) ARPGX2
Influenza A by PCR: NEGATIVE
Influenza B by PCR: NEGATIVE
SARS Coronavirus 2 by RT PCR: NEGATIVE

## 2021-01-31 LAB — PHOSPHORUS: Phosphorus: 2.6 mg/dL (ref 2.5–4.6)

## 2021-01-31 LAB — CBC
HCT: 36.7 % (ref 36.0–46.0)
Hemoglobin: 12 g/dL (ref 12.0–15.0)
MCH: 31.1 pg (ref 26.0–34.0)
MCHC: 32.7 g/dL (ref 30.0–36.0)
MCV: 95.1 fL (ref 80.0–100.0)
Platelets: 190 10*3/uL (ref 150–400)
RBC: 3.86 MIL/uL — ABNORMAL LOW (ref 3.87–5.11)
RDW: 15.2 % (ref 11.5–15.5)
WBC: 7.5 10*3/uL (ref 4.0–10.5)
nRBC: 0 % (ref 0.0–0.2)

## 2021-01-31 LAB — LACTIC ACID, PLASMA
Lactic Acid, Venous: 1 mmol/L (ref 0.5–1.9)
Lactic Acid, Venous: 0.8 mmol/L (ref 0.5–1.9)

## 2021-01-31 LAB — TROPONIN I (HIGH SENSITIVITY)
Troponin I (High Sensitivity): 18 ng/L — ABNORMAL HIGH (ref <18)
Troponin I (High Sensitivity): 21 ng/L — ABNORMAL HIGH (ref <18)

## 2021-01-31 LAB — BRAIN NATRIURETIC PEPTIDE: B Natriuretic Peptide: 273.3 pg/mL — ABNORMAL HIGH (ref 0.0–100.0)

## 2021-01-31 LAB — PROTIME-INR
INR: 1.8 — ABNORMAL HIGH (ref 0.8–1.2)
Prothrombin Time: 20.4 seconds — ABNORMAL HIGH (ref 11.4–15.2)

## 2021-01-31 LAB — MAGNESIUM: Magnesium: 1.9 mg/dL (ref 1.7–2.4)

## 2021-01-31 LAB — TSH: TSH: 4.457 u[IU]/mL (ref 0.350–4.500)

## 2021-01-31 MED ORDER — LEVOTHYROXINE SODIUM 50 MCG PO TABS
50.0000 ug | ORAL_TABLET | Freq: Every day | ORAL | Status: DC
  Administered 2021-02-01: 50 ug via ORAL
  Filled 2021-01-31: qty 1

## 2021-01-31 MED ORDER — ADULT MULTIVITAMIN W/MINERALS CH
1.0000 | ORAL_TABLET | Freq: Every day | ORAL | Status: DC
  Administered 2021-01-31 – 2021-02-01 (×2): 1 via ORAL
  Filled 2021-01-31 (×2): qty 1

## 2021-01-31 MED ORDER — AMIODARONE HCL 200 MG PO TABS
200.0000 mg | ORAL_TABLET | Freq: Every day | ORAL | Status: DC
  Administered 2021-02-01: 200 mg via ORAL
  Filled 2021-01-31: qty 1

## 2021-01-31 MED ORDER — VENLAFAXINE HCL 75 MG PO TABS
75.0000 mg | ORAL_TABLET | Freq: Two times a day (BID) | ORAL | Status: DC
Start: 2021-01-31 — End: 2021-02-01
  Administered 2021-02-01: 75 mg via ORAL
  Filled 2021-01-31 (×3): qty 1

## 2021-01-31 MED ORDER — ACETAMINOPHEN 650 MG RE SUPP: 650.0000 mg | Freq: Four times a day (QID) | RECTAL | Status: DC | PRN

## 2021-01-31 MED ORDER — SODIUM CHLORIDE 0.9 % IV SOLN
2.0000 g | Freq: Once | INTRAVENOUS | Status: AC
  Administered 2021-01-31: 2 g via INTRAVENOUS
  Filled 2021-01-31: qty 20

## 2021-01-31 MED ORDER — FUROSEMIDE 10 MG/ML IJ SOLN
40.0000 mg | Freq: Once | INTRAMUSCULAR | Status: AC
  Administered 2021-01-31: 40 mg via INTRAVENOUS
  Filled 2021-01-31: qty 4

## 2021-01-31 MED ORDER — ISOSORBIDE MONONITRATE ER 30 MG PO TB24
30.0000 mg | ORAL_TABLET | Freq: Every day | ORAL | Status: DC
  Administered 2021-02-01: 30 mg via ORAL
  Filled 2021-01-31: qty 1

## 2021-01-31 MED ORDER — RIVAROXABAN 15 MG PO TABS
15.0000 mg | ORAL_TABLET | Freq: Every day | ORAL | Status: DC
  Administered 2021-01-31: 15 mg via ORAL
  Filled 2021-01-31: qty 1

## 2021-01-31 MED ORDER — SENNA 8.6 MG PO TABS
1.0000 | ORAL_TABLET | Freq: Two times a day (BID) | ORAL | Status: DC
  Administered 2021-01-31 – 2021-02-01 (×2): 8.6 mg via ORAL
  Filled 2021-01-31 (×2): qty 1

## 2021-01-31 MED ORDER — ACETAMINOPHEN 325 MG PO TABS: 650.0000 mg | ORAL_TABLET | Freq: Four times a day (QID) | ORAL | Status: DC | PRN

## 2021-01-31 MED ORDER — INSULIN ASPART 100 UNIT/ML ~~LOC~~ SOLN
0.0000 [IU] | Freq: Three times a day (TID) | SUBCUTANEOUS | Status: DC
  Administered 2021-02-01: 5 [IU] via SUBCUTANEOUS

## 2021-01-31 MED ORDER — INSULIN GLARGINE 100 UNIT/ML ~~LOC~~ SOLN
20.0000 [IU] | Freq: Every day | SUBCUTANEOUS | Status: DC
  Administered 2021-02-01: 20 [IU] via SUBCUTANEOUS
  Filled 2021-01-31 (×3): qty 0.2

## 2021-01-31 MED ORDER — METOPROLOL SUCCINATE ER 100 MG PO TB24
100.0000 mg | ORAL_TABLET | Freq: Every day | ORAL | Status: DC
  Administered 2021-02-01: 100 mg via ORAL
  Filled 2021-01-31: qty 1

## 2021-01-31 MED ORDER — FERROUS SULFATE 325 (65 FE) MG PO TABS
325.0000 mg | ORAL_TABLET | Freq: Two times a day (BID) | ORAL | Status: DC
  Administered 2021-02-01: 325 mg via ORAL
  Filled 2021-01-31: qty 1

## 2021-01-31 MED ORDER — SODIUM CHLORIDE 0.9% FLUSH
3.0000 mL | Freq: Two times a day (BID) | INTRAVENOUS | Status: DC
  Administered 2021-01-31 – 2021-02-01 (×2): 3 mL via INTRAVENOUS

## 2021-01-31 MED ORDER — NYSTATIN 100000 UNIT/GM EX POWD
Freq: Two times a day (BID) | CUTANEOUS | Status: DC
  Filled 2021-01-31: qty 15

## 2021-01-31 MED ORDER — HYDRALAZINE HCL 50 MG PO TABS
75.0000 mg | ORAL_TABLET | Freq: Three times a day (TID) | ORAL | Status: DC
  Administered 2021-01-31 – 2021-02-01 (×2): 75 mg via ORAL
  Filled 2021-01-31 (×2): qty 1

## 2021-01-31 MED ORDER — IPRATROPIUM BROMIDE HFA 17 MCG/ACT IN AERS
2.0000 | INHALATION_SPRAY | Freq: Four times a day (QID) | RESPIRATORY_TRACT | Status: DC | PRN
  Filled 2021-01-31: qty 12.9

## 2021-01-31 MED ORDER — LACTATED RINGERS IV SOLN: INTRAVENOUS | Status: DC

## 2021-01-31 NOTE — ED Notes (Signed)
Pt's CBG result was 93. Informed Melissa - RN.

## 2021-01-31 NOTE — ED Provider Notes (Signed)
[facility] EMERGENCY DEPARTMENT Provider Note   CSN: [id number] Arrival date & time: 01/31/21  1103     History Chief Complaint  Patient presents with  . Hallucinations    Shannon Lucas is a 87 y.o. female.  HPI Patient presents with her daughter who is also historian.  Patient's daughter lives with the patient and gives her her medications twice daily.  Thing was normal up until day before yesterday.  2 days ago she started reporting some hallucinations.  On the first day she talked about things that had not happened that somebody had told her.  Daughter clarified with the patient that that was not accurate.  Last night, the patient was awake much of the night reporting that there were people in her room talking to her all night.  She reports they would not leave.  This morning then, the patient revealed her daughter that a scar on her arm had a small white worms in it which she had been trying to pick out with the scissors.  Patient's daughter is wondering if she has a urinary tract infection.  She reports she has been ill before without an apparent cause and it was identified to be a UTI.  She has not had any fevers.  There has been no vomiting or diarrhea.  Patient's appetite did not have significant change.  She has not observed her to be short of breath.  Patient is denying that she has chest pain or headache.  Patient does not endorse abdominal pain or pain or burning urgency with urination.  Baseline, patient is ambulatory with a rolling walker.  Patient is daughter points out that in December patient was having frequent falls.  She was getting home physical therapy and often as soon as they left she would fall.  That has actually improved and the patient is good about using her rolling walker.  She has not had recent falls, but due to that prior episode her daughter reports that her PCP was considering getting some brain imaging.  Patient's daughter reports that she has not  exhibited any signs of dementia along the way.  She reports at baseline her mental status is clear and sharp.  They have not been seeing incremental decline in cognitive function.    Past Medical History:  Diagnosis Date  . A-fib ([place])   . Arthritis    "a little bit qwhere" (02/28/2017)  . CHF (congestive heart failure) ([place])   . CKD (chronic kidney disease), stage III ([place])    [name] 02/28/2017  . Depression   . GERD (gastroesophageal reflux disease)   . High cholesterol   . Hypertension   . Melanoma of back ([place])   . Myocardial infarction [facility])    "one dr said I'd had 1" (02/28/2017)  . Stroke [facility]) ~ 2010; ~2012   "affected the right side but I fully recovered; just a light one" (02/28/2017)  . Type II diabetes mellitus [facility])     Patient Active Problem List   Diagnosis Date Noted  . Ileus ([place]) 03/03/2020  . Elevated troponin 03/03/2020  . Acute hypoxemic respiratory failure ([place]) 03/03/2020  . Palliative care by specialist   . Goals of care, counseling/discussion   . Sepsis ([place]) 02/22/2020  . Thrombocytopenia ([place]) 02/22/2020  . Pneumonia 02/22/2020  . Proliferative diabetic retinopathy of both eyes without macular edema associated with type 2 diabetes mellitus ([place]) 09/27/2018  . Pacemaker 06/25/2018  . Tachy-brady syndrome ([place]) 11/20/2017  . Vitamin D  deficiency 10/14/2017  . Acute on chronic systolic CHF (congestive heart failure) ([place]) 03/15/2017  . Pseudoaneurysm following procedure ([place])   . Acute systolic congestive heart failure ([place])   . AKI (acute kidney injury) ([place])   . CHF exacerbation ([place]) 03/01/2017  . Fall   . Macrocytic anemia   . CKD (chronic kidney disease) stage 3, GFR 30-59 ml/min (H[facility])   . Diabetes mellitus type II, controlled ([place])   . Paroxysmal atrial fibrillation ([facility])   . Chronic anticoagulation   . Pure hypercholesterolemia   . Shortness of breath 02/28/2017  . Persistent cough for 3 weeks or longer 10/25/2016  . Murmur 07/28/2016  .  Severe nonproliferative diabetic retinopathy of both eyes without macular edema associated with type 2 diabetes mellitus ([place]) 07/13/2016  . Hyperparathyroidism due to renal insufficiency ([place]) 02/19/2016  . Microalbuminuria due to type 2 diabetes mellitus ([employer]) 02/19/2016  . GERD (gastroesophageal reflux disease) 11/13/2015  . Risk for falls 11/13/2015  . TIA (transient ischemic attack) 04/15/2015  . Essential hypertension 08/05/2008    Past Surgical History:  Procedure Laterality Date  . APPENDECTOMY    . FALSE ANEURYSM REPAIR Right 03/08/2017   Procedure: REPAIR RIGHT RADIAL FALSE ANEURYSM;  Surgeon: Rosetta Posner, MD;  Location: [facility];  Service: Vascular;  Laterality: Right;  . KNEE SURGERY Left 1970s   "I have 1/3 of my knee left in there"  . LAPAROSCOPIC CHOLECYSTECTOMY    . MELANOMA EXCISION     "back"  . PACEMAKER IMPLANT N/A 11/20/2017   Procedure: PACEMAKER IMPLANT;  Surgeon: Constance Haw, MD;  Location: [facility] CV LAB;  Service: Cardiovascular;  Laterality: N/A;  . RIGHT/LEFT HEART CATH AND CORONARY ANGIOGRAPHY N/A 03/06/2017   Procedure: Right/Left Heart Cath and Coronary Angiography;  Surgeon: Larey Dresser, MD;  Location: [facility] CV [facility];  Service: Cardiovascular;  Laterality: N/A;  . TUBAL LIGATION       OB History   No obstetric history on file.     Family History  Problem Relation Age of Onset  . Diabetes Mother   . Stroke Father   . Parkinson's disease Brother     Social History   Tobacco Use  . Smoking status: Former Research scientist (life sciences)  . Smokeless tobacco: Never Used  . Tobacco comment: 02/28/2017 "only smoked in the 1960s when we went out"  Vaping Use  . Vaping Use: Never used  Substance Use Topics  . Alcohol use: No  . Drug use: No    Home Medications Prior to Admission medications   Medication Sig Start Date End Date Taking? Authorizing Provider  acetaminophen (TYLENOL) 500 MG tablet Take 1,000 mg by mouth every 6 (six) hours as needed for  moderate pain or headache.    [provider]  amiodarone (PACERONE) 200 MG tablet TAKE 1 TABLET BY MOUTH DAIY 11/05/19   Bensimhon, Shaune Pascal, MD  ergocalciferol (VITAMIN D2) 50000 units capsule Take 50,000 Units by mouth every Friday. At night    [provider]  ferrous sulfate 325 (65 FE) MG tablet Take 1 tablet (325 mg total) by mouth 2 (two) times daily with a meal. 02/27/20   Mercy Riding, MD  furosemide (LASIX) 20 MG tablet Take 1 tablet (20 mg total) by mouth daily. 10/21/20   Larey Dresser, MD  hydrALAZINE (APRESOLINE) 50 MG tablet Take 1.5 tablets (75 mg total) by mouth 3 (three) times daily. 06/15/20   Larey Dresser, MD  ipratropium (ATROVENT HFA) 17 MCG/ACT  inhaler Inhale 2 puffs into the lungs every 6 (six) hours as needed for wheezing. 09/06/17   Larey Dresser, MD  isosorbide mononitrate (IMDUR) 30 MG 24 hr tablet TAKE 1 TABLET BY MOUTH DAILY 04/08/20   Larey Dresser, MD  LANTUS SOLOSTAR 100 UNIT/ML Solostar Pen Inject 20 Units into the skin daily. 02/27/20   Mercy Riding, MD  levothyroxine (SYNTHROID) 50 MCG tablet Take 50 mcg by mouth daily. 01/29/20   [provider]  metoprolol succinate (TOPROL-XL) 100 MG 24 hr tablet TAKE 1 TABLET BY MOUTH DAILY IMMEDIATELYFOLLOWING A MEAL 03/20/20   Larey Dresser, MD  polyethylene glycol powder [facility]) 17 GM/SCOOP powder Take 17 g by mouth 2 (two) times daily as needed for moderate constipation. 02/27/20   Mercy Riding, MD  pravastatin (PRAVACHOL) 40 MG tablet Take 1 tablet (40 mg total) by mouth at bedtime. 06/09/20   Larey Dresser, MD  senna-docusate (SENOKOT-S) 8.6-50 MG tablet Take 1 tablet by mouth 2 (two) times daily as needed for moderate constipation. 02/27/20   Mercy Riding, MD  venlafaxine (EFFEXOR) 75 MG tablet Take 75 mg by mouth daily.  02/03/17   [provider]  XARELTO 15 MG TABS tablet TAKE 1 TABLET BY MOUTH DAILY WITH SUPPER 03/13/20   Larey Dresser, MD    Allergies    Patient  has no known allergies.  Review of Systems   Review of Systems 10 systems reviewed and negative except as per HPI Physical Exam Updated Vital Signs BP (!) 149/74 (BP Location: Left Arm)   Pulse 63   Temp 98.1 F (36.7 C)   Resp 16   SpO2 96%   Physical Exam Constitutional:      Comments: Patient is alert.  She is sitting at the edge of the stretcher.  She is well-groomed and nontoxic in appearance.  No respiratory distress at rest.  HENT:     Head: Normocephalic and atraumatic.     Mouth/Throat:     Mouth: Mucous membranes are moist.     Pharynx: Oropharynx is clear.  Eyes:     Extraocular Movements: Extraocular movements intact.     Pupils: Pupils are equal, round, and reactive to light.  Cardiovascular:     Rate and Rhythm: Normal rate and regular rhythm.  Pulmonary:     Effort: Pulmonary effort is normal.     Breath sounds: Normal breath sounds.     Comments: beneath the left breast, patient has moderate candidal rash.  Red and slightly moist.  Confined to opposing skin surfaces.  No foul-smelling exudative maceration Abdominal:     General: There is no distension.     Palpations: Abdomen is soft.     Tenderness: There is no abdominal tenderness. There is no guarding.     Comments: Groin folds are without any candidal rash.  They are all clean and dry.  Musculoskeletal:        General: Normal range of motion.     Cervical back: Neck supple.     Comments: No peripheral edema.  Entirety of extremities and feet are examined.  There are no signs of cellulitis or wounds. Patient's right volar forearm has healed, thickened scars from prior skin tears.  This is the area patient perceived to have infestation.  These are all healed and are not showing signs of secondary infection at this time.  See attached images  Skin:    General: Skin is warm and dry.  Neurological:  Comments: Patient is alert.  She is answering questions appropriately.  Follows commands appropriately.   Lumens are coordinated purposeful symmetric.  No focal motor deficits.  Patient does seem to recognize that there are problems with her cognitive function.  She seems mildly confused.  Psychiatric:        Mood and Affect: Mood normal.     ED Results / Procedures / Treatments   Labs (all labs ordered are listed, but only abnormal results are displayed) Labs Reviewed  COMPREHENSIVE METABOLIC PANEL - Abnormal; Notable for the following components:      Result Value   Glucose, Bld 117 (*)    Creatinine, Ser 1.57 (*)    Total Protein 6.2 (*)    Albumin 3.4 (*)    GFR, Estimated 32 (*)    All other components within normal limits  CBC - Abnormal; Notable for the following components:   RBC 3.86 (*)    All other components within normal limits  URINALYSIS, ROUTINE W REFLEX MICROSCOPIC - Abnormal; Notable for the following components:   APPearance HAZY (*)    Protein, ur 100 (*)    Leukocytes,Ua MODERATE (*)    Bacteria, UA RARE (*)    All other components within normal limits  URINE CULTURE  CULTURE, BLOOD (ROUTINE X 2)  CULTURE, BLOOD (ROUTINE X 2)  RESP PANEL BY RT-PCR (FLU A&B, COVID) ARPGX2  LACTIC ACID, PLASMA  LACTIC ACID, PLASMA  PROTIME-INR  URINALYSIS, ROUTINE W REFLEX MICROSCOPIC  BRAIN NATRIURETIC PEPTIDE  BLOOD GAS, VENOUS  MAGNESIUM  PHOSPHORUS  TSH  AMMONIA  CBG MONITORING, ED  TROPONIN I (HIGH SENSITIVITY)    EKG EKG Interpretation  Date/Time:  Sunday January 31 2021 20:16:07 EDT Ventricular Rate:  63 PR Interval:    QRS Duration: 164 QT Interval:  536 QTC Calculation: 549 R Axis:   -81 Text Interpretation: Failure to sense and/or capture (?magnet) Sinus or ectopic atrial rhythm Prolonged PR interval Nonspecific IVCD with LAD Left ventricular hypertrophy Anterior Q waves, possibly due to LVH IVCD is new since last tracing Confirmed by Dorie Rank [phone]) on 02/01/2021 2:20:18 PM   Radiology No results found.  Procedures Procedures   Medications  Ordered in ED Medications  lactated ringers infusion (has no administration in time range)    ED Course  I have reviewed the triage vital signs and the nursing notes.  Pertinent labs & imaging results that were available during my care of the patient were reviewed by me and considered in my medical decision making (see chart for details).    MDM Rules/Calculators/A&P                          Patient presents with confusion for several days.  Has been manifest with hallucinations.  Patient has not had any medication changes. she has had similar symptoms in the past with urinary tract infection.  First urinalysis was suggestive of UTI however contamination present.  Repeat cath specimen without significant WBCs.  Positive finding on CT scan is for maxillary sinusitis.  At this time, unclear if this is possible etiology for insidious encephalopathy.  Patient did exhibit more hallucinations and ongoing confusion while in the emergency department.  At this time plan will be for admission.  Patient has had empiric antibiotic therapy with Rocephin.  Currently no apparent medications either added or withdrawn likely to precipitate symptoms.  Patient will need admission for persistent confusion, encephalopathic with hallucinations. Final Clinical Impression(s) /  ED Diagnoses Final diagnoses:  Hallucinations  Encephalopathy acute  Maxillary sinusitis, unspecified chronicity    Rx / DC Orders ED Discharge Orders    None       Charlesetta Shanks, MD 02/03/21 1154

## 2021-01-31 NOTE — H&P (Addendum)
Date: 01/31/2021               Patient Name:  Shannon Lucas MRN: [id number]  DOB: August 19, 1931 Age / Sex: 87 y.o., female   PCP: Manfred Shirts, PA         Medical Service: Internal Medicine Teaching Service         Attending Physician: Dr. Jimmye Norman, Elaina Pattee, MD    First Contact: Dr. Cato Mulligan Pager: [phone]  Second Contact: Dr. Tamsen Snider Pager: [phone]       After Hours (After 5p/  First Contact Pager: [phone]  weekends / holidays): Second Contact Pager: [phone]   Chief Complaint: auditory/visual hallucinations  History of Present Illness:  Shannon Lucas is an 87 year old female with PMHx of paroxysmal atrial fibrillation on Xarelto, HFpEF (NYHA II), tachy-brady syndrome s/p PPM placement, CKD III, hypertension, insulin dependent type II diabetes mellitus, hyperlipidemia, depression, prior CVAs without any ongoing residual deficits presenting with several days of visual and auditory hallucinations. Patient is a poor historian and her daughter is at bedside to help with history. Patient is usually independent in her ADL's and uses rolling walker for ambulation. Her daughter helps with medication management. Patient has complete hearing loss in the right ear and 40% hearing loss in the left, she uses a hearing aid. Over the past several days, patient has been experiencing auditory hallucinations with hearing music and nonspecific voices outside her bedroom. Yesterday, patient reportedly saw her granddaughter who did not visit. Overnight, patient was awake much of the night reporting that there were several people in her room talking to her all night. This morning, patient thought that there was a scar on her arm with small white worms that she was trying to pick at. Her daughter notes that previously when she has had hallucinations, she was diagnosed with a UTI. Patient denies any urinary symptoms, fevers/chills, vision changes, sinus pain, changes in appetite, nausea/vomiting,  abdominal pain, chest pain, cough, shortness of breath, or diarrhea/constipation. She does endorse a mild constant headache that is frontotemporal in nature but notes that this occurs frequently and is not new. She also has a history of frequent falls with multiple falls in December; however, is currently using a rolling walker and has not had any recent falls over the past week.   ED Course:  Patient presented with several days of auditory and visual hallucinations. Afebrile, mildly hypertensive, pulse and RR wnl and saturating well on room air. Work up including CBC, CMP without leukocytosis or electrolyte imbalances. TSH 4.457. hsTroponins 18>21. CBG's in 90's. VBG with pH 7.329, pCO2 48, pO2 47. CXR with mild pulmonary vascular congestion, BNP 273. CT Head without Contrast did not have any acute intracranial abnormalities, multiple chronic infarctions. Patient admitted for further evaluation of her hallucinations.   Meds:  Current Meds  Medication Sig  . acetaminophen (TYLENOL) 500 MG tablet Take 1,000 mg by mouth every 6 (six) hours as needed for moderate pain or headache.  Marland Kitchen amiodarone (PACERONE) 200 MG tablet TAKE 1 TABLET BY MOUTH DAIY  . ergocalciferol (VITAMIN D2) 50000 units capsule Take 50,000 Units by mouth every Friday. At night  . ferrous sulfate 325 (65 FE) MG tablet Take 1 tablet (325 mg total) by mouth 2 (two) times daily with a meal.  . furosemide (LASIX) 20 MG tablet Take 1 tablet (20 mg total) by mouth daily.  . hydrALAZINE (APRESOLINE) 50 MG tablet Take 1.5 tablets (75 mg total) by mouth 3 (three)  times daily.  Marland Kitchen ipratropium (ATROVENT HFA) 17 MCG/ACT inhaler Inhale 2 puffs into the lungs every 6 (six) hours as needed for wheezing.  . isosorbide mononitrate (IMDUR) 30 MG 24 hr tablet TAKE 1 TABLET BY MOUTH DAILY  . LANTUS SOLOSTAR 100 UNIT/ML Solostar Pen Inject 20 Units into the skin daily. (Patient taking differently: Inject 50 Units into the skin daily.)  . levothyroxine  (SYNTHROID) 50 MCG tablet Take 50 mcg by mouth daily.  . metoprolol succinate (TOPROL-XL) 100 MG 24 hr tablet TAKE 1 TABLET BY MOUTH DAILY IMMEDIATELYFOLLOWING A MEAL (Patient taking differently: Take 100 mg by mouth daily.)  . polyethylene glycol powder (MIRALAX) 17 GM/SCOOP powder Take 17 g by mouth 2 (two) times daily as needed for moderate constipation.  . pravastatin (PRAVACHOL) 40 MG tablet Take 1 tablet (40 mg total) by mouth at bedtime.  . senna-docusate (SENOKOT-S) 8.6-50 MG tablet Take 1 tablet by mouth 2 (two) times daily as needed for moderate constipation.  Marland Kitchen venlafaxine (EFFEXOR) 75 MG tablet Take 75 mg by mouth daily.   [name] 15 MG TABS tablet TAKE 1 TABLET BY MOUTH DAILY WITH SUPPER   Allergies: Allergies as of 01/31/2021  . (No Known Allergies)   Past Medical History:  Diagnosis Date  . A-fib ([place])   . Arthritis    "a little bit qwhere" (02/28/2017)  . CHF (congestive heart failure) ([place])   . CKD (chronic kidney disease), stage III ([place])    [name] 02/28/2017  . Depression   . GERD (gastroesophageal reflux disease)   . High cholesterol   . Hypertension   . Melanoma of back ([place])   . Myocardial infarction [facility])    "one dr said I'd had 1" (02/28/2017)  . Stroke [facility]) ~ 2010; ~2012   "affected the right side but I fully recovered; just a light one" (02/28/2017)  . Type II diabetes mellitus ([facility])     Family History:  Family History  Problem Relation Age of Onset  . Diabetes Mother   . Stroke Father   . Parkinson's disease Brother    Social History:  [place] with her daughter and daughter's husband. She is a widow. She is mostly independent in her ADL's and uses a rolling walker for ambulation. Her daughter helps her with medication management. Patient has a history of tobacco use briefly in the 1960's but notes that it was socially whenever she went out with her friends. She also endorses some social alcohol use during this time as well. However,  denies any tobacco or alcohol use since then. She does not have a history of illicit drug use. No over the counter/herbal medications.   Review of Systems: A complete ROS was negative except as per HPI.   Physical Exam: Blood pressure (!) 158/67, pulse (!) 59, temperature 98.1 F (36.7 C), resp. rate 11, SpO2 93 %. Physical Exam  Constitutional: Elderly female, appears well-developed and well-nourished. No distress.  HENT: Normocephalic and atraumatic, EOMI, conjunctiva normal, moist mucous membranes. Mild periorbital edema with some discoloration at the nasal bridge  Cardiovascular: Normal rate, regular rhythm, S1 and S2 present, no murmurs, rubs, gallops.  Distal pulses intact Respiratory: No respiratory distress, no accessory muscle use.  Effort is normal. Diffuse crackles on exam  GI: Nondistended, soft, nontender to palpation, active bowel sounds Musculoskeletal: Normal bulk and tone.  Trace bilateral pitting edema to mid-tibia  Neurological: Is alert and oriented x4, no apparent focal deficits noted. Skin: Warm and dry.  Mild intertrigo in left breast fold; multiple healed skin tears on bilateral upper extremities.  Psychiatric: Normal mood and affect. Behavior is normal. Judgment and thought content normal. Denies ongoing auditory or visual hallucinations   EKG: atrial paced rhythm; LBBB (unchanged from prior), LAD, QTc 549  CXR: pulmonary vascular congestion, no focal pneumonia/pleural effusion  CT HEAD WO CONTRAST:  IMPRESSION: No evidence of acute intracranial abnormality. Mild cerebral atrophy with advanced cerebral white matter chronic small vessel ischemic disease. Redemonstrated chronic small-vessel infarct within the left corona radiata. Redemonstrated chronic right thalamic lacunar infarct. Redemonstrated small chronic infarcts within the left cerebellar hemisphere. Severe left maxillary sinusitis.  Assessment & Plan by Problem: Active Problems:   Altered mental  status  Shannon Lucas is an 87 year old female with PMHx of PAF on Xarelto, HFpEF, tachy-brady syndrome s/p PPM, CKD III, IDDM, and depression admitted for auditory and visual hallucinations.  Auditory and visual hallucinations Patient presenting with auditory hallucinations for 3-4 days and 2 days of visual hallucinations. Work up thus far has been negative for infectious or metabolic etiology. CT Head wo Contrast significant for chronic infarctions, including R thalamic lacunar infarction, left cerebellar infarction and left corona radiata infarctions and chronic small vessel ischemic disease. No focal deficits noted on exam. Patient does note history of frequent falls but none recently.  In the past, patient was diagnosed with UTI when she would have hallucinations. Initial UA did have moderate leukocytes and proteinuria with rare bacteria and budding yeast present; however, also noted to have 11-20 squamous epithelial cells. Repeat UA negative for leukocytes or nitrites. She did receive one dose of Rocephin in the ED.  Patient does have history of hypothyroidism for which she is on levothyroxine without any recent changes to dosing. TSH has been wnl. However, she does have some periorbital edema on exam. No associated rash appreciated. No recent skin, hair, or nail changes. She has general fatigue but no recent changes in weight. Will follow up with free T4.  Differential also includes seizures, although no other associated findings that would be consistent with this. If she has persistent hallucinations, may consider getting EEG. - MRI Brain wo Contrast - No obvious source of infection at this time, will d/c antibiotics - F/u free T4, ESR, CRP - F/u blood cultures  - Minimize centrally acting medications - Delirium precautions - Patient requires her hearing aids - PT/OT eval   HFpEF NYHA II Tachy-brady syndrome s/p PPM Patient with history of intermittent atrial fibrillation and bradycardia  to the 30's for which patient had permanent pacemaker placed in 2018. Most recent Echo in June 2021 with EF 60-65%, normal RV systolic pressure, moderately elevated pulmonary artery systolic pressure. Mild aortic stenosis. Labs during her visit in December 2021 with mild AKI for which her Lasix was decreased from 55m to 222mdaily. On exam, patient has mild diffuse crackles on lung exam and trace bilateral lower extremity edema. No significant JVD appreciated on exam. BNP is slightly elevated at 273, although this has been higher in the past during her heart failure exacerbations. Given that she does have some hypervolemia, will increase her diuretic for tonight. - IV Lasix 4080mnce - Continue home HF medications including Toprol-XL 100m39mily, Hydralazine 75mg79m, and IMDUR 30mg 14my - Pending volume status, can resume home lasix dosing in AM - Strict I&O - Monitor electrolytes   PAF on Xarelto Patient is on amiodarone and xarelto for her paroxysmal atrial fibrillation. Currently in normal sinus  rhythm. - Continue amiodarone 261m daily - Continue Xarelto 135mdaily  CKD III Patient has a history of CKDIII with baseline sCr ~1.7-1.8. Her sCr on admission was 1.57. Renal function is currently at baseline. - Continue to monitor kidney function - Avoid nephrotoxic agents as able   Respiratory acidosis ABG consistent with mild respiratory acidosis. Patient does not have a history of COPD; however, her daughter notes that there were concerns for sleep apnea and patient was scheduled for a sleep study previously but was unable to get it done in light of the pandemic. Suspect that her acidosis is likely chronic and unlikely contributing to her current symptoms. - Continue to monitor for any signs of respiratory distress - Consider trial of CPAP   Intertrigo: Erythematous and moist rash under left breast without odor. Consistent with candidal infection. - Nystatin powder bid  Diabetes  mellitus Most recent HbA1c 7.4. Patient is on Lantus 20U daily and notes good glucose control. CBG's in the ED have been in the 90's.  - Continue Lantus 20U daily with SSI - HbA1c in AM  Depression Patient is on venlafaxine twice daily for depression. Denies any recent stressors or missed doses. - Continue venlafaxine 7513mwice daily   Diet: HH/CM Fluids: None Electrolytes: Monitor and replete prn DVT Prophylaxis: Xarelto Code status: FULL   Dispo: Admit patient to Inpatient with expected length of stay greater than 2 midnights.  Signed: AslHarvie HeckD  [facility] PGY-2 01/31/2021, 7:50 PM  Pager: [phone] 5pm on weekdays and 1pm on weekends: On Call pager: [phone]

## 2021-01-31 NOTE — ED Triage Notes (Addendum)
Pt having auditory and visual hallucinations that have been going on for approx 2 days. She believes she hears her family arguing and believes there are people in her bedroom.  Family concerned that she may have a UTI.

## 2021-01-31 NOTE — ED Notes (Signed)
The pts daughter is concerned that her mother appears to be getting more confused and she thinks there is something in her blankets  The daughter is asking what the plans are for her mother

## 2021-01-31 NOTE — ED Notes (Addendum)
RN spoke with Crystal in [place] per Lantus arriving to ED with 14 units instead of ordered 20 units. RN was told that another Lantus with ordered dose will be sent for pt.

## 2021-02-01 ENCOUNTER — Other Ambulatory Visit: Payer: Self-pay | Admitting: Internal Medicine

## 2021-02-01 DIAGNOSIS — R410 Disorientation, unspecified: Secondary | ICD-10-CM

## 2021-02-01 LAB — COMPREHENSIVE METABOLIC PANEL
ALT: 18 U/L (ref 0–44)
AST: 25 U/L (ref 15–41)
Albumin: 3.3 g/dL — ABNORMAL LOW (ref 3.5–5.0)
Alkaline Phosphatase: 91 U/L (ref 38–126)
Anion gap: 8 (ref 5–15)
BUN: 24 mg/dL — ABNORMAL HIGH (ref 8–23)
CO2: 26 mmol/L (ref 22–32)
Calcium: 9.5 mg/dL (ref 8.9–10.3)
Chloride: 103 mmol/L (ref 98–111)
Creatinine, Ser: 1.74 mg/dL — ABNORMAL HIGH (ref 0.44–1.00)
GFR, Estimated: 28 mL/min — ABNORMAL LOW (ref >60)
Glucose, Bld: 150 mg/dL — ABNORMAL HIGH (ref 70–99)
Potassium: 4 mmol/L (ref 3.5–5.1)
Sodium: 137 mmol/L (ref 135–145)
Total Bilirubin: 1.5 mg/dL — ABNORMAL HIGH (ref 0.3–1.2)
Total Protein: 6 g/dL — ABNORMAL LOW (ref 6.5–8.1)

## 2021-02-01 LAB — URINE CULTURE

## 2021-02-01 LAB — CBC
HCT: 35.8 % — ABNORMAL LOW (ref 36.0–46.0)
Hemoglobin: 11.7 g/dL — ABNORMAL LOW (ref 12.0–15.0)
MCH: 30.3 pg (ref 26.0–34.0)
MCHC: 32.7 g/dL (ref 30.0–36.0)
MCV: 92.7 fL (ref 80.0–100.0)
Platelets: 193 10*3/uL (ref 150–400)
RBC: 3.86 MIL/uL — ABNORMAL LOW (ref 3.87–5.11)
RDW: 15.3 % (ref 11.5–15.5)
WBC: 6.9 10*3/uL (ref 4.0–10.5)
nRBC: 0 % (ref 0.0–0.2)

## 2021-02-01 LAB — HEMOGLOBIN A1C
Hgb A1c MFr Bld: 6.6 % — ABNORMAL HIGH (ref 4.8–5.6)
Mean Plasma Glucose: 142.72 mg/dL

## 2021-02-01 LAB — GLUCOSE, CAPILLARY
Glucose-Capillary: 102 mg/dL — ABNORMAL HIGH (ref 70–99)
Glucose-Capillary: 221 mg/dL — ABNORMAL HIGH (ref 70–99)

## 2021-02-01 MED ORDER — AMOXICILLIN-POT CLAVULANATE 250-125 MG PO TABS: 1.0000 | ORAL_TABLET | Freq: Two times a day (BID) | ORAL | 0 refills | Status: AC

## 2021-02-01 MED FILL — AMOX-CLAV 250-125 MG TABLET: 250-125 | 5 days supply | Qty: 10 | Fill #0

## 2021-02-01 NOTE — Progress Notes (Signed)
Remote pacemaker transmission.   

## 2021-02-01 NOTE — Discharge Instructions (Signed)
Information on my medicine - XARELTO (Rivaroxaban)  This medication education was reviewed with me or my healthcare representative as part of my discharge preparation.  The pharmacist that spoke with me during my hospital stay was:  Onnie Boer, RPH-CPP  Why was Xarelto prescribed for you? Xarelto was prescribed for you to reduce the risk of a blood clot forming that can cause a stroke if you have a medical condition called atrial fibrillation (a type of irregular heartbeat).  What do you need to know about xarelto ? Take your Xarelto ONCE DAILY at the same time every day with your evening meal. If you have difficulty swallowing the tablet whole, you may crush it and mix in applesauce just prior to taking your dose.  Take Xarelto exactly as prescribed by your doctor and DO NOT stop taking Xarelto without talking to the doctor who prescribed the medication.  Stopping without other stroke prevention medication to take the place of Xarelto may increase your risk of developing a clot that causes a stroke.  Refill your prescription before you run out.  After discharge, you should have regular check-up appointments with your healthcare provider that is prescribing your Xarelto.  In the future your dose may need to be changed if your kidney function or weight changes by a significant amount.  What do you do if you miss a dose? If you are taking Xarelto ONCE DAILY and you miss a dose, take it as soon as you remember on the same day then continue your regularly scheduled once daily regimen the next day. Do not take two doses of Xarelto at the same time or on the same day.   Important Safety Information A possible side effect of Xarelto is bleeding. You should call your healthcare provider right away if you experience any of the following: ? Bleeding from an injury or your nose that does not stop. ? Unusual colored urine (red or dark brown) or unusual colored stools (red or black). ? Unusual  bruising for unknown reasons. ? A serious fall or if you hit your head (even if there is no bleeding).  Some medicines may interact with Xarelto and might increase your risk of bleeding while on Xarelto. To help avoid this, consult your healthcare provider or pharmacist prior to using any new prescription or non-prescription medications, including herbals, vitamins, non-steroidal anti-inflammatory drugs (NSAIDs) and supplements.  This website has more information on Xarelto: [email or web address]  Ms. Rill,  As Dr. Jimmye Norman discussed we prescribed an antibiotic for you to take for 5 days for sinusitis.  We recommend following up with your primary care physician.  We expect the hallucinations to improve with time and your evaluation does not suggest you need further workup.  Our best

## 2021-02-01 NOTE — Plan of Care (Signed)
  Problem: Urinary Elimination: Goal: Signs and symptoms of infection will decrease Outcome: Progressing   

## 2021-02-01 NOTE — Progress Notes (Signed)
Informed of MRI for today.   Device system confirmed to be MRI conditional, with implant date > 6 weeks ago and no evidence of abandoned or epicardial leads in review of most recent CXR   Interrogation from today reviewed. Pt has RV lead that is programmed Unipolar due to high threshold. Minimal pacing, but up to 10% at times. Unsafe to change lead to Bipolar given high thresholds.  Would not consider pts RV lead safe for MRI.   Shirley Friar, PA-C  02/01/2021 12:49 PM

## 2021-02-01 NOTE — Progress Notes (Signed)
   02/01/21 1200  Clinical Encounter Type  Visited With Patient  Visit Type Initial  Referral From Nurse  Consult/Referral To Chaplain  Chaplain responded- I advised the patient I am responding to her request for an Advance Directive. She nodded and I proceeded to provide education about the document.  The patient stated she wanted her daughter, Ginger, to make medical decisions for her. The patient asked if I had time to talk. She stated she is concerned she is losing her mind. She thought her youngest brother Milus Banister (who lives in [place]) had visited her this morning. She received a phone call during our visit and asked the caller if he had come earlier. After the call ended, she spoke of her mother Reynaldo Minium taking care of the chicken coup and her father would not help and stated I am confused my mother is dead.  She spoke of her daughter putting up wallpaper in her home, the patient stated she built a mother-in-law suite next to her daughter, she stated her son in law does not want her visiting their home and his fussing keeps her awake at night and she is afraid her daughter is going to have a stroke.  I provided the ministry of presence and we prayed for her peace and that she would rest well. I advised her and her nurse to call when her daughter visits. The patient appears to be confused; an assessment needs to be done to determine if patient is competent to sign the advance directive. This note was prepared by Jeanine Luz, M.Div..  For questions please contact by phone [phone].

## 2021-02-01 NOTE — Evaluation (Signed)
Physical Therapy Evaluation Patient Details Name: Shannon Lucas MRN: [id number] DOB: 05-13-1931 Today's Date: 02/01/2021   History of Present Illness  87 year old female presetns to ED 01/31/21 with presenting with several days of visual and auditory hallucinations. CT Head without Contrast did not have any acute intracranial abnormalities, multiple chronic infarctions. Admitted for further evaluation. PMHx of paroxysmal atrial fibrillation on Xarelto, HFpEF (NYHA II), tachy-brady syndrome s/p PPM placement, CKD III, hypertension, insulin dependent type II diabetes mellitus, hyperlipidemia, depression, prior CVAs without any ongoing residual deficits.  Clinical Impression  Pt is oriented to self, place and situation however is a poor historian. Pt relates PTA pt living with daughter and son in-law in mother in-law suite. Pt reports independence in bADLs and ambulation with Rollator, however endorses many falls, the last of which was in February. Pt is limited in safe mobility by poor safety awareness and poor insight into her deficits. Pt is currently min guard for transfer to Rollator, however needs maximal cuing for safety. Pt is able to ambulate with min guard with Rollator however experiences 2x loss of balance requiring min A for steadying. Given deficits PT recommending 24 supervision for safety in particular for mobility, with HHPT. If pt unable to have 24 hour supervision may have to consider SNF level care.     Follow Up Recommendations Home health PT;Supervision/Assistance - 24 hour;Supervision for mobility/OOB    Equipment Recommendations  None recommended by PT (has necessary equipment)       Precautions / Restrictions Precautions Precautions: Fall;Other (comment) Precaution Comments: reports frequent falls Restrictions Weight Bearing Restrictions: No      Mobility  Bed Mobility Overal bed mobility: Needs Assistance Bed Mobility: Supine to Sit     Supine to sit: Supervision;HOB  elevated     General bed mobility comments: OOB in recliner on entry    Transfers Overall transfer level: Needs assistance Equipment used: 4-wheeled walker Transfers: Sit to/from [facility] Sit to Stand: Min guard Stand pivot transfers: Min assist       General transfer comment: min guard for power up and steadying, requires vc for setting breaks before power up  Ambulation/Gait Ambulation/Gait assistance: Min guard;Min assist Gait Distance (Feet): 100 Feet Assistive device: 4-wheeled walker Gait Pattern/deviations: Step-through pattern;Decreased step length - right;Decreased step length - left;Trunk flexed Gait velocity: slowed Gait velocity interpretation: <1.8 ft/sec, indicate of risk for recurrent falls General Gait Details: min guard for safety, with unsteady gait requiring 2x min A for steadying with posterior LoB     Balance Overall balance assessment: Needs assistance;History of Falls Sitting-balance support: No upper extremity supported;Feet supported Sitting balance-Leahy Scale: Fair     Standing balance support: During functional activity;Bilateral upper extremity supported Standing balance-Leahy Scale: Poor Standing balance comment: LOB without UE support in standing standing, reliant on B UE support for mobility                             Pertinent Vitals/Pain Pain Assessment: No/denies pain    Home Living Family/patient expects to be discharged to:: Private residence Living Arrangements: Children Available Help at Discharge: Family;Available PRN/intermittently Type of Home: House Home Access: Stairs to enter   Entrance Stairs-Number of Steps: 3 Home Layout: One level Home Equipment: Walker - 4 wheels;Cane - single point;Shower seat - built in Additional Comments: Lives in mother in law suite attached to daughter's house. Reports daughter works and son in [employer] works    Prior [employer]  Level of Independence: Independent with  assistive device(s)         Comments: Pt has been using rollator for ambulation. Able to complete ADLs and simple kitchen/cleaning tasks in attached apartment. Endorses falls even when using Rollator though "they werent as bad"     Hand Dominance   Dominant Hand: Right    Extremity/Trunk Assessment   Upper Extremity Assessment Upper Extremity Assessment: Defer to OT evaluation    Lower Extremity Assessment Lower Extremity Assessment: Generalized weakness (R LE >L LE assessed in mobility)    Cervical / Trunk Assessment Cervical / Trunk Assessment: Kyphotic  Communication   Communication: HOH  Cognition Arousal/Alertness: Awake/alert Behavior During Therapy: WFL for tasks assessed/performed Overall Cognitive Status: Impaired/Different from baseline Area of Impairment: Attention;Memory;Following commands;Safety/judgement;Awareness;Problem solving                   Current Attention Level: Sustained Memory: Decreased short-term memory Following Commands: Follows one step commands with increased time;Follows multi-step commands inconsistently Safety/Judgement: Decreased awareness of safety;Decreased awareness of deficits Awareness: Emergent Problem Solving: Slow processing;Requires verbal cues;Requires tactile cues;Difficulty sequencing General Comments: Difficulty attending to tasks. While completing ADLs at sink, pt reports need to go out in hallway to see if brother is coming here. Some visual hallucinations possible, reported seeing soap or paper in sink that was not there. Poor safety awareness, either leaving Rollator behind and did not lock brakes despite cues throughout      General Comments General comments (skin integrity, edema, etc.): VSS on RA        Assessment/Plan    PT Assessment Patient needs continued PT services  PT Problem List Decreased activity tolerance;Decreased balance;Decreased mobility;Decreased safety awareness;Decreased knowledge of  use of DME;Decreased cognition       PT Treatment Interventions DME instruction;Gait training;Stair training;Functional mobility training;Therapeutic activities;Therapeutic exercise;Balance training;Cognitive remediation;Patient/family education    PT Goals (Current goals can be found in the Care Plan section)  Acute Rehab PT Goals Patient Stated Goal: be able to go home soon PT Goal Formulation: With patient Time For Goal Achievement: 02/15/21 Potential to Achieve Goals: Fair    Frequency Min 3X/week    AM-PAC PT "6 Clicks" Mobility  Outcome Measure Help needed turning from your back to your side while in a flat bed without using bedrails?: None Help needed moving from lying on your back to sitting on the side of a flat bed without using bedrails?: None Help needed moving to and from a bed to a chair (including a wheelchair)?: A Little Help needed standing up from a chair using your arms (e.g., wheelchair or bedside chair)?: None Help needed to walk in hospital room?: A Little Help needed climbing 3-5 steps with a railing? : A Little 6 Click Score: 21    End of Session Equipment Utilized During Treatment: Gait belt Activity Tolerance: Patient tolerated treatment well Patient left: in chair;with call bell/phone within reach;with chair alarm set Nurse Communication: Mobility status PT Visit Diagnosis: Unsteadiness on feet (R26.81);Other abnormalities of gait and mobility (R26.89);Repeated falls (R29.6);Muscle weakness (generalized) (M62.81);History of falling (Z91.81);Difficulty in walking, not elsewhere classified (R26.2)    Time: 4081-4481 PT Time Calculation (min) (ACUTE ONLY): 18 min   Charges:   PT Evaluation $PT Eval Moderate Complexity: 1 Mod          Makella Buckingham B. Migdalia Dk PT, DPT Acute Rehabilitation Services Pager [phone] Office [phone]   [place] 02/01/2021, 10:35 AM

## 2021-02-01 NOTE — Progress Notes (Signed)
Patient off the unit for MRI at this time.

## 2021-02-01 NOTE — Evaluation (Signed)
Occupational Therapy Evaluation Patient Details Name: Shannon Lucas MRN: [id number] DOB: 08/20/1931 Today's Date: 02/01/2021    History of Present Illness 87 year old female presetns to ED 01/31/21 with presenting with several days of visual and auditory hallucinations. CT Head without Contrast did not have any acute intracranial abnormalities, multiple chronic infarctions. Admitted for further evaluation. PMHx of paroxysmal atrial fibrillation on Xarelto, HFpEF (NYHA II), tachy-brady syndrome s/p PPM placement, CKD III, hypertension, insulin dependent type II diabetes mellitus, hyperlipidemia, depression, prior CVAs without any ongoing residual deficits.   Clinical Impression   PTA, pt lives in mother-in-law suite attached to daughter's home. Pt reports typically able to complete ADLs and basic IADLs without assist. Pt uses Rollator for mobility and endorses frequent falls. Pt presents now with diagnoses above and deficits in balance, cognition, strength, and endurance. Pt overall Min A for mobility to/from bathroom with Rollator, assist to correct frequent minor LOB and consistent cues needed to use Rollator brakes. Pt overall Min A for UB/LB ADLs standing at sink, requiring physical assist to correct posterior lean in standing. Anticipate pt to progress well as confusion clears. However, due to frequent falls and current presentation today, recommend SNF for short term rehab. If pt opts to go home, recommend HHOT follow-up and direct supervision/assist for daily tasks as pt is a high fall risk.     Follow Up Recommendations  SNF;Supervision/Assistance - 24 hour (vs HHOT if direct supervision/assist can be provided for mobility, ADLs, and IADLs)    Equipment Recommendations  Other (comment);3 in 1 bedside commode (RW)    Recommendations for Other Services       Precautions / Restrictions Precautions Precautions: Fall;Other (comment) Precaution Comments: reports frequent  falls Restrictions Weight Bearing Restrictions: No      Mobility Bed Mobility Overal bed mobility: Needs Assistance Bed Mobility: Supine to Sit     Supine to sit: Supervision;HOB elevated     General bed mobility comments: no assist given    Transfers Overall transfer level: Needs assistance Equipment used: 4-wheeled walker Transfers: Sit to/from [facility] Sit to Stand: Min guard Stand pivot transfers: Min assist       General transfer comment: Min A for turning with Rollator to ensure balance/steadiness. Able to rise from surfaces with increased time but no assist    Balance Overall balance assessment: Needs assistance;History of Falls Sitting-balance support: No upper extremity supported;Feet supported Sitting balance-Leahy Scale: Fair     Standing balance support: During functional activity;Bilateral upper extremity supported Standing balance-Leahy Scale: Poor Standing balance comment: LOB without UE support in standing standing, reliant on B UE support for mobility                           ADL either performed or assessed with clinical judgement   ADL Overall ADL's : Needs assistance/impaired Eating/Feeding: Independent;Sitting   Grooming: Minimal assistance;Standing;Wash/dry face;Wash/dry Nurse, mental health Details (indicate cue type and reason): Min A to maintain balance with posterior lean unsupported Upper Body Bathing: Minimal assistance;Standing Upper Body Bathing Details (indicate cue type and reason): Min A to maintain balance while bathing arms, underarms in standing at sink Lower Body Bathing: Minimal assistance;Sit to/from stand Lower Body Bathing Details (indicate cue type and reason): Min A for balance while washing peri region     Lower Body Dressing: Minimal assistance;Sit to/from stand Lower Body Dressing Details (indicate cue type and reason): Able to don socks sitting EOB, requires assist to maintain balance in  standing during dressing Toilet Transfer: Minimal assistance;Ambulation;Regular Toilet;Grab bars Toilet Transfer Details (indicate cue type and reason): Min A to maintain balance due to unsteadiness despite Rollator use Toileting- Clothing Manipulation and Hygiene: Minimal assistance;Sit to/from stand Toileting - Clothing Manipulation Details (indicate cue type and reason): Min A for clothing mgmt and balance in standing     Functional mobility during ADLs: Minimal assistance;Cueing for sequencing;Cueing for safety General ADL Comments: Pt with poor safety awareness in DME use and noted balance deficits in standing. Pt with decreased attention, confusion and possible visual hallucinations during session     Vision Baseline Vision/History: Wears glasses Wears Glasses: Reading only Patient Visual Report: No change from baseline Vision Assessment?: Vision impaired- to be further tested in functional context Additional Comments: visual deficits vs hallucinations?     Perception     Praxis      Pertinent Vitals/Pain Pain Assessment: No/denies pain     Hand Dominance Right   Extremity/Trunk Assessment Upper Extremity Assessment Upper Extremity Assessment: Overall WFL for tasks assessed   Lower Extremity Assessment Lower Extremity Assessment: Defer to PT evaluation   Cervical / Trunk Assessment Cervical / Trunk Assessment: Kyphotic   Communication Communication Communication: HOH   Cognition Arousal/Alertness: Awake/alert Behavior During Therapy: WFL for tasks assessed/performed Overall Cognitive Status: Impaired/Different from baseline Area of Impairment: Attention;Memory;Following commands;Safety/judgement;Awareness;Problem solving                   Current Attention Level: Sustained Memory: Decreased short-term memory Following Commands: Follows one step commands with increased time;Follows multi-step commands inconsistently Safety/Judgement: Decreased awareness  of safety;Decreased awareness of deficits Awareness: Emergent Problem Solving: Slow processing;Requires verbal cues;Requires tactile cues;Difficulty sequencing General Comments: Difficulty attending to tasks. While completing ADLs at sink, pt reports need to go out in hallway to see if brother is coming here. Some visual hallucinations possible, reported seeing soap or paper in sink that was not there. Poor safety awareness, either leaving Rollator behind and did not lock brakes despite cues throughout   General Comments       Exercises     Shoulder Instructions      Home Living Family/patient expects to be discharged to:: Private residence Living Arrangements: Children Available Help at Discharge: Family;Available PRN/intermittently Type of Home: House Home Access: Stairs to enter [employer] of Steps: 3   Home Layout: One level     Bathroom Shower/Tub: Occupational psychologist: Standard     Home Equipment: Environmental consultant - 4 wheels;Cane - single point;Shower seat - built in   Additional Comments: Lives in mother in law suite attached to daughter's house. Reports daughter works and son in Sports [employer] works      Prior Functioning/Environment Level of Independence: Independent with assistive device(s)        Comments: Pt has been using rollator for ambulation. Able to complete ADLs and simple kitchen/cleaning tasks in attached apartment. Endorses falls even when using Rollator though "they werent as bad"        OT Problem List: Decreased strength;Decreased activity tolerance;Impaired balance (sitting and/or standing);Decreased cognition;Decreased safety awareness;Decreased knowledge of use of DME or AE      OT Treatment/Interventions: Self-care/ADL training;Therapeutic exercise;Energy conservation;DME and/or AE instruction;Therapeutic activities;Patient/family education;Balance training    OT Goals(Current goals can be found in the care plan section) Acute Rehab OT  Goals Patient Stated Goal: be able to go home soon OT Goal Formulation: With patient Time For Goal Achievement: 02/15/21 Potential to Achieve Goals: Good  OT Frequency:  Min 2X/week   Barriers to D/C:            Co-evaluation              AM-PAC OT "6 Clicks" Daily Activity     Outcome Measure Help from another person eating meals?: None Help from another person taking care of personal grooming?: A Little Help from another person toileting, which includes using toliet, bedpan, or urinal?: A Little Help from another person bathing (including washing, rinsing, drying)?: A Little Help from another person to put on and taking off regular upper body clothing?: A Little Help from another person to put on and taking off regular lower body clothing?: A Little 6 Click Score: 19   End of Session Equipment Utilized During Treatment: Gait belt;Other (comment) Agricultural consultant) Nurse Communication: Mobility status;Other (comment) (breakfast, balance, cognition)  Activity Tolerance: Patient tolerated treatment well Patient left: in chair;with call bell/phone within reach;with chair alarm set  OT Visit Diagnosis: Unsteadiness on feet (R26.81);Other abnormalities of gait and mobility (R26.89);Muscle weakness (generalized) (M62.81);Other symptoms and signs involving cognitive function;History of falling (Z91.81)                Time: 4982-6415 OT Time Calculation (min): 31 min Charges:  OT General Charges $OT Visit: 1 Visit OT Evaluation $OT Eval Moderate Complexity: 1 Mod OT Treatments $Self Care/Home Management : 8-22 mins  Malachy Chamber, OTR/L Acute Rehab Services Office: [phone]  Layla Maw 02/01/2021, 9:17 AM

## 2021-02-01 NOTE — Discharge Summary (Addendum)
Name: Shannon Lucas MRN: [id number] DOB: 06/09/1931 87 y.o. PCP: Manfred Shirts, PA  Date of Admission: 01/31/2021 11:12 AM Date of Discharge:  02/01/2021 Attending Physician: Angelica Pou, MD  Discharge Diagnosis: 1. Delirium, unknown etiology  Discharge Medications: Allergies as of 02/01/2021   No Known Allergies     Medication List    TAKE these medications   acetaminophen 500 MG tablet Commonly known as: TYLENOL Take 1,000 mg by mouth every 6 (six) hours as needed for moderate pain or headache.   amiodarone 200 MG tablet Commonly known as: PACERONE TAKE 1 TABLET BY MOUTH DAIY What changed: See the new instructions.   amoxicillin-clavulanate 250-125 MG tablet Commonly known as: AUGMENTIN Take 1 tablet by mouth 2 (two) times daily for 5 days.   ergocalciferol 1.25 MG (50000 UT) capsule Commonly known as: VITAMIN D2 Take 50,000 Units by mouth every Friday. At night   ferrous sulfate 325 (65 FE) MG tablet Take 1 tablet (325 mg total) by mouth 2 (two) times daily with a meal.   furosemide 20 MG tablet Commonly known as: LASIX Take 1 tablet (20 mg total) by mouth daily.   hydrALAZINE 50 MG tablet Commonly known as: APRESOLINE Take 1.5 tablets (75 mg total) by mouth 3 (three) times daily.   ipratropium 17 MCG/ACT inhaler Commonly known as: Atrovent HFA Inhale 2 puffs into the lungs every 6 (six) hours as needed for wheezing.   isosorbide mononitrate 30 MG 24 hr tablet Commonly known as: IMDUR TAKE 1 TABLET BY MOUTH DAILY   Lantus SoloStar 100 UNIT/ML Solostar Pen Generic drug: insulin glargine Inject 20 Units into the skin daily. What changed: how much to take   levothyroxine 50 MCG tablet Commonly known as: SYNTHROID Take 50 mcg by mouth daily.   metoprolol succinate 100 MG 24 hr tablet Commonly known as: TOPROL-XL TAKE 1 TABLET BY MOUTH DAILY IMMEDIATELYFOLLOWING A MEAL What changed: See the new instructions.   polyethylene glycol powder 17  GM/SCOOP powder Commonly known as: MiraLax Take 17 g by mouth 2 (two) times daily as needed for moderate constipation.   pravastatin 40 MG tablet Commonly known as: PRAVACHOL Take 1 tablet (40 mg total) by mouth at bedtime.   senna-docusate 8.6-50 MG tablet Commonly known as: Senokot-S Take 1 tablet by mouth 2 (two) times daily as needed for moderate constipation.   venlafaxine 75 MG tablet Commonly known as: EFFEXOR Take 75 mg by mouth in the morning and at bedtime.   Xarelto 15 MG Tabs tablet Generic drug: Rivaroxaban TAKE 1 TABLET BY MOUTH DAILY WITH SUPPER What changed: how much to take       Disposition and follow-up:   Shannon Lucas was discharged from [facility] in [place] condition.  At the hospital follow up visit please address:  1. Delirium, unknown etiology - possible UTI. Having auditory and visual hallucinations, seeing deceased family. Work up negative for infectious or metabolic etiology. CT head with chronic infarcts, but no acute findings. Discharging home with [facility] PT and 24-hour supervision/assistance. Discharged with 5-day course of renally-adjusted augmentin to cover for left maxillary sinusitis.  2.  Labs / imaging needed at time of follow-up: CBC, CMP  3.  Pending labs/ test needing follow-up: None  Follow-up Appointments:   Hospital Course by problem list: 1. Delirium, unknown etiology. Presenting with auditory hallucinations for 3-4 days and 2 days of visual hallucinations. As per daughter, patient had this happen to her in the past and was subsequently  diagnosed with UTI, with resolution of symptoms as UTI was treated. Initial UA showed moderate leukocytes and proteinuria with rare bacteria and budding yeast, but noted to have 11-20 squamous epithelial cells. Repeat UA negative for leukocytes or nitrites. Patient did receive a dose of rocephin in the ED. Otherwise, work up was negative for infection and metabolic etiologies. CT head showed  chronic infarctions including right thalamic lacunar infarction, left cerebellar infarction, and left corona radiata infarction. CT head also showed severe left maxillary sinusitis. She appears well on exam today, still noticing some hallucinations but knows that they are not real because "they do not reply back to me when I talk to them". Presentation is consistent with delirium of unknown etiology and thus patient would improve more quickly at home surrounded by family. Will discharge with 5-day course of renally-adjusted augmentin to cover maxillary sinusitis, which could have possibly been the trigger for her delirium. Will also discharge with HH PT and 24-hour supervision/assistance.    Discharge Exam:   BP (!) 165/94 (BP Location: Left Arm)   Pulse 66   Temp 97.8 F (36.6 C) (Oral)   Resp 18   Wt 86.1 kg   SpO2 97%   BMI 31.59 kg/m  Discharge exam:  General: elderly female, well-nourished, lying in bed, NAD. CV: normal rate and regular rhythm, no m/r/g. DP pulses 2+ bilaterally. Pulm: CTABL, no respiratory distress, no adventitious sounds noted. GI: nondistended, soft, nontender to palpation. Normoactive bowel sounds. Neuro: AAOx4, no focal deficits noted. Skin: warm and dry. Multiple healed/healing skin tears on bilateral upper extremities. Psych: normal mood and affect, normal judgment and thought content, normal behavior, denies current auditory or visual hallucinations.   Pertinent Labs, Studies, and Procedures:  CMP Latest Ref Rng & Units 02/01/2021 01/31/2021 01/31/2021  Glucose 70 - 99 mg/dL 150(H) - 117(H)  BUN 8 - 23 mg/dL 24(H) - 22  Creatinine 0.44 - 1.00 mg/dL 1.74(H) - 1.57(H)  Sodium 135 - 145 mmol/L 137 141 136  Potassium 3.5 - 5.1 mmol/L 4.0 3.9 4.0  Chloride 98 - 111 mmol/L 103 - 104  CO2 22 - 32 mmol/L 26 - 25  Calcium 8.9 - 10.3 mg/dL 9.5 - 9.1  Total Protein 6.5 - 8.1 g/dL 6.0(L) - 6.2(L)  Total Bilirubin 0.3 - 1.2 mg/dL 1.5(H) - 1.0  Alkaline Phos 38 - 126  U/L 91 - 98  AST 15 - 41 U/L 25 - 21  ALT 0 - 44 U/L 18 - 18   CBC Latest Ref Rng & Units 02/01/2021 01/31/2021 01/31/2021  WBC 4.0 - 10.5 K/uL 6.9 - 7.5  Hemoglobin 12.0 - 15.0 g/dL 11.7(L) 12.2 12.0  Hematocrit 36.0 - 46.0 % 35.8(L) 36.0 36.7  Platelets 150 - 400 K/uL 193 - 190   BNP    Component Value Date/Time   BNP 273.3 (H) 01/31/2021 1335   Lactic Acid, Venous    Component Value Date/Time   LATICACIDVEN 0.8 01/31/2021 2000   Urinalysis    Component Value Date/Time   COLORURINE STRAW (A) 01/31/2021 1243   APPEARANCEUR CLEAR 01/31/2021 1243   LABSPEC 1.008 01/31/2021 1243   PHURINE 6.0 01/31/2021 1243   [place] 01/31/2021 1243   HGBUR NEGATIVE 01/31/2021 1243   BILIRUBINUR NEGATIVE 01/31/2021 1243   [place] 01/31/2021 1243   PROTEINUR NEGATIVE 01/31/2021 1243   NITRITE NEGATIVE 01/31/2021 1243   LEUKOCYTESUR NEGATIVE 01/31/2021 1243    Blood Culture    Component Value Date/Time   SDES BLOOD SITE NOT  SPECIFIED 01/31/2021 1246   SPECREQUEST  01/31/2021 1246    [place] Blood Culture results may not be optimal due to an inadequate volume of blood received in culture bottles   CULT  01/31/2021 1246    NO GROWTH < 24 HOURS Performed at [facility], [place] [address] [place], [place] [address]    REPTSTATUS PENDING 01/31/2021 1246    Hemoglobin A1c  6.6  TSH 4.45  VBG:  pH 7.329 / pCO2 48.3 / Bicarb 25.4   DG Chest 2 View  Result Date: 01/31/2021 CLINICAL DATA:  Mental status change. EXAM: CHEST - 2 VIEW COMPARISON:  March 03, 2020 FINDINGS: The mediastinal contour and cardiac silhouette are stable. The heart size is enlarged. Cardiac pacemaker is unchanged. Diffuse increased pulmonary interstitial markings are identified in both lungs. Mild scar of the left lung base is noted. There is no focal pneumonia or pleural effusion. Osseous structures are stable. IMPRESSION: Mild congestive heart failure, probably  chronic. Electronically Signed   By: Abelardo Diesel M.D.   On: 01/31/2021 13:12   CT Head Wo Contrast  Result Date: 01/31/2021 CLINICAL DATA:  Mental status change, unknown cause. Additional provided: Patient reports several incidents of becoming week and falling. EXAM: CT HEAD WITHOUT CONTRAST TECHNIQUE: Contiguous axial images were obtained from the base of the skull through the vertex without intravenous contrast. COMPARISON:  Head CT 02/22/2020. FINDINGS: Brain: Mild cerebral atrophy. Moderate to moderately advanced patchy and ill-defined hypoattenuation within the cerebral white matter is nonspecific, but compatible with chronic small vessel ischemic disease. Redemonstrated chronic small-vessel infarct within the left corona radiata Redemonstrated chronic lacunar infarct within the right thalamus (series 4, image 17). Redemonstrated small chronic left cerebellar infarcts. There is no acute intracranial hemorrhage. No demarcated cortical infarct. No extra-axial fluid collection. No evidence of intracranial mass. No midline shift. Vascular: No hyperdense vessel.  Atherosclerotic calcifications. Skull: Normal. Negative for fracture or focal lesion. Sinuses/Orbits: Visualized orbits show no acute finding. Severe partial opacification of the left maxillary sinus. IMPRESSION: No evidence of acute intracranial abnormality. Mild cerebral atrophy with advanced cerebral white matter chronic small vessel ischemic disease. Redemonstrated chronic small-vessel infarct within the left corona radiata. Redemonstrated chronic right thalamic lacunar infarct. Redemonstrated small chronic infarcts within the left cerebellar hemisphere. Severe left maxillary sinusitis. Electronically Signed   By: Kellie Simmering DO   On: 01/31/2021 14:32   Discharge Instructions: Discharge Instructions    Diet - low sodium heart healthy   Complete by: As directed    Increase activity slowly   Complete by: As directed        Signed: Virl Axe, MD 02/01/2021, 4:07 PM   Pager: [phone]

## 2021-02-01 NOTE — Care Management CC44 (Signed)
Condition Code 44 Documentation Completed  Patient Details  Name: Shannon Lucas MRN: [id number] Date of Birth: 24-Apr-1931   Condition Code 44 given:  Yes Patient signature on Condition Code 44 notice:  Yes Documentation of 2 MD's agreement:  Yes Code 44 added to claim:  Yes    Ninfa Meeker, RN 02/01/2021, 4:57 PM

## 2021-02-01 NOTE — Care Management Obs Status (Signed)
[place] NOTIFICATION   Patient Details  Name: Charlen Bakula MRN: [id number] Date of Birth: 10/23/1931   Medicare Observation Status Notification Given:  Yes    Ninfa Meeker, RN 02/01/2021, 4:57 PM

## 2021-02-01 NOTE — Progress Notes (Signed)
Brought patient down for MRI.  Tried to program pacemaker, one lead is unipolar both leads have to be bi-polar for pacer to be placed in safe mode.  Unable to do MRI due to this issue.

## 2021-02-05 LAB — CULTURE, BLOOD (ROUTINE X 2)
Culture: NO GROWTH
Culture: NO GROWTH

## 2021-03-08 ENCOUNTER — Other Ambulatory Visit (HOSPITAL_COMMUNITY): Payer: Self-pay | Admitting: Cardiology

## 2021-03-09 ENCOUNTER — Other Ambulatory Visit (HOSPITAL_COMMUNITY): Payer: Self-pay | Admitting: *Deleted

## 2021-03-09 MED ORDER — XARELTO 15 MG PO TABS: 15.0000 mg | ORAL_TABLET | Freq: Every day | ORAL | 11 refills | Status: DC

## 2021-03-19 ENCOUNTER — Other Ambulatory Visit (HOSPITAL_COMMUNITY): Payer: Self-pay | Admitting: Cardiology

## 2021-03-22 ENCOUNTER — Other Ambulatory Visit (HOSPITAL_COMMUNITY): Payer: Self-pay | Admitting: Cardiology

## 2021-03-23 ENCOUNTER — Other Ambulatory Visit: Payer: Self-pay

## 2021-03-23 ENCOUNTER — Other Ambulatory Visit (HOSPITAL_COMMUNITY): Payer: Self-pay

## 2021-03-23 ENCOUNTER — Encounter (HOSPITAL_COMMUNITY): Payer: Self-pay | Admitting: Cardiology

## 2021-03-23 ENCOUNTER — Ambulatory Visit (HOSPITAL_COMMUNITY)
Admission: RE | Admit: 2021-03-23 | Discharge: 2021-03-23 | Disposition: A | Discharge status: Home / Self Care | Payer: Medicare Other | Source: Ambulatory Visit | Attending: Cardiology | Admitting: Cardiology

## 2021-03-23 VITALS — BP 144/78 | HR 64 | Wt 197.8 lb

## 2021-03-23 DIAGNOSIS — I428 Other cardiomyopathies: Secondary | ICD-10-CM | POA: Insufficient documentation

## 2021-03-23 DIAGNOSIS — Z7901 Long term (current) use of anticoagulants: Secondary | ICD-10-CM | POA: Diagnosis not present

## 2021-03-23 DIAGNOSIS — I5042 Chronic combined systolic (congestive) and diastolic (congestive) heart failure: Secondary | ICD-10-CM | POA: Diagnosis not present

## 2021-03-23 DIAGNOSIS — I2510 Atherosclerotic heart disease of native coronary artery without angina pectoris: Secondary | ICD-10-CM | POA: Insufficient documentation

## 2021-03-23 DIAGNOSIS — M545 Low back pain, unspecified: Secondary | ICD-10-CM | POA: Diagnosis not present

## 2021-03-23 DIAGNOSIS — I5022 Chronic systolic (congestive) heart failure: Secondary | ICD-10-CM | POA: Diagnosis not present

## 2021-03-23 DIAGNOSIS — Z8673 Personal history of transient ischemic attack (TIA), and cerebral infarction without residual deficits: Secondary | ICD-10-CM | POA: Diagnosis not present

## 2021-03-23 DIAGNOSIS — E785 Hyperlipidemia, unspecified: Secondary | ICD-10-CM | POA: Diagnosis not present

## 2021-03-23 DIAGNOSIS — Z794 Long term (current) use of insulin: Secondary | ICD-10-CM | POA: Diagnosis not present

## 2021-03-23 DIAGNOSIS — I4892 Unspecified atrial flutter: Secondary | ICD-10-CM | POA: Insufficient documentation

## 2021-03-23 DIAGNOSIS — D649 Anemia, unspecified: Secondary | ICD-10-CM | POA: Diagnosis not present

## 2021-03-23 DIAGNOSIS — I480 Paroxysmal atrial fibrillation: Secondary | ICD-10-CM | POA: Diagnosis not present

## 2021-03-23 DIAGNOSIS — Z87891 Personal history of nicotine dependence: Secondary | ICD-10-CM | POA: Insufficient documentation

## 2021-03-23 DIAGNOSIS — R001 Bradycardia, unspecified: Secondary | ICD-10-CM | POA: Diagnosis not present

## 2021-03-23 DIAGNOSIS — D696 Thrombocytopenia, unspecified: Secondary | ICD-10-CM | POA: Diagnosis not present

## 2021-03-23 DIAGNOSIS — Z79899 Other long term (current) drug therapy: Secondary | ICD-10-CM | POA: Insufficient documentation

## 2021-03-23 DIAGNOSIS — E1122 Type 2 diabetes mellitus with diabetic chronic kidney disease: Secondary | ICD-10-CM | POA: Diagnosis not present

## 2021-03-23 DIAGNOSIS — R0609 Other forms of dyspnea: Secondary | ICD-10-CM | POA: Diagnosis present

## 2021-03-23 DIAGNOSIS — N183 Chronic kidney disease, stage 3 unspecified: Secondary | ICD-10-CM | POA: Diagnosis not present

## 2021-03-23 DIAGNOSIS — I130 Hypertensive heart and chronic kidney disease with heart failure and stage 1 through stage 4 chronic kidney disease, or unspecified chronic kidney disease: Secondary | ICD-10-CM | POA: Diagnosis not present

## 2021-03-23 LAB — CBC
HCT: 35.3 % — ABNORMAL LOW (ref 36.0–46.0)
Hemoglobin: 11.1 g/dL — ABNORMAL LOW (ref 12.0–15.0)
MCH: 30.3 pg (ref 26.0–34.0)
MCHC: 31.4 g/dL (ref 30.0–36.0)
MCV: 96.4 fL (ref 80.0–100.0)
Platelets: 220 10*3/uL (ref 150–400)
RBC: 3.66 MIL/uL — ABNORMAL LOW (ref 3.87–5.11)
RDW: 15.5 % (ref 11.5–15.5)
WBC: 8.3 10*3/uL (ref 4.0–10.5)
nRBC: 0 % (ref 0.0–0.2)

## 2021-03-23 LAB — BASIC METABOLIC PANEL
Anion gap: 6 (ref 5–15)
BUN: 27 mg/dL — ABNORMAL HIGH (ref 8–23)
CO2: 25 mmol/L (ref 22–32)
Calcium: 9.3 mg/dL (ref 8.9–10.3)
Chloride: 109 mmol/L (ref 98–111)
Creatinine, Ser: 1.48 mg/dL — ABNORMAL HIGH (ref 0.44–1.00)
GFR, Estimated: 34 mL/min — ABNORMAL LOW (ref >60)
Glucose, Bld: 147 mg/dL — ABNORMAL HIGH (ref 70–99)
Potassium: 3.9 mmol/L (ref 3.5–5.1)
Sodium: 140 mmol/L (ref 135–145)

## 2021-03-23 LAB — BRAIN NATRIURETIC PEPTIDE: B Natriuretic Peptide: 664.9 pg/mL — ABNORMAL HIGH (ref 0.0–100.0)

## 2021-03-23 MED ORDER — EMPAGLIFLOZIN 10 MG PO TABS: 10.0000 mg | ORAL_TABLET | Freq: Every day | ORAL | 11 refills | Status: DC

## 2021-03-23 MED ORDER — FUROSEMIDE 20 MG PO TABS: ORAL_TABLET | ORAL | 11 refills | Status: DC

## 2021-03-23 NOTE — Progress Notes (Signed)
Advanced Heart Failure Clinic Note   Primary Care: [facility], [place] HF Cardiology: Dr. Aundra Dubin  HPI:  Shannon Lucas is a 87 y.o. female with PMH of Systolic CHF,  PAF on Xarelto, CKD stage III, DM II, GERD, HLD, CVA, anemia.   Admitted 4/17 -> 8/33/82 with A/C systolic CHF. L/RHC as below with normal coronaries and elevated filling pressures. Diuresed 5 lbs. Hospital course complicated by hematoma at radial cath site that improved overnight. Medications adjusted as tolerated. Discharge weight 205 lbs.    Pt seen in ED 03/08/17 for worsening of hematoma as above.  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 Dr. Donnetta Hutching saw in consult and repaired under local anaesthesia and sedation.   Echo in 10/18 showed EF 35-40% with diffuse hypokinesis, PASP 50 mmHg.  Holter in 10/18 showed runs of atrial fibrillation including afib with aberrancy.  Bradycardia into the 30s was also noted.  Decision was made to place PPM => patient got Medtronic device with His bundle lead and is now His pacing.    Echo in 2/20 showed EF up to 60-65%, mild LVH, normal RV.   She was admitted in 4/21 with fall, rib fractures, sepsis due to UTI, and CHF exacerbation. She was in and out of atrial fibrillation in the hospital.  She was diuresed.   Echo in 6/21 with EF 60-65%, normal RV, mild AS.   She was admitted in 3/22 with delirium and hallucinations, ?related to sinusitis.   She presents today for followup of CHF.  BP is elevated but she has not taken any of her medications yet.  BP has been controlled when she checks at home.  She has generalized fatigue and daytime sleepiness.  She has chronic dyspnea walking 100-200 feet.  No chest pain.  Uses walker mainly due to low back pain. Not lightheaded. Weight stable.   REDS clip 39%  Medtronic PPM interrogation: 3.1% V-pacing, 98% A-pacing, no AF noted.   Labs (04/06/17): K 5.0, Creatinine 2.04, BUN 43, LFTs normal Labs (6/18): K 3.9, creatinine 1.68 Labs (8/18): LDL 83, HDL 51, hgb  11.1, K 4.5, creatinine 1.48, LFTs normal, TSH normal.  Labs (1/19): K 4, creatinine 1.5, hgb 12.2, TSH normal, LFTs normal Labs (7/19): K 4.3, creatinine 1.78, LDL 105, TSH normal, LFTs normal Labs (1/20): K 3.9, creatinine 1.95 Labs (4/21): K 3.9, creatinine 1.86 Labs (5/21): LDL 64 Labs (6/21): K 4.1, creatinine 1.8, hgb 9.8 Labs (8/21): K 3.5, creatinine 1.75 Labs (11/21): LDL 66 Labs (3/22): K 4, creatinine 1.7  Review of systems complete and found to be negative unless listed in HPI.    PMH 1. Chronic systolic CHF: Nonischemic cardiomyopathy.  - Echo 03/01/17 LVEF 25-30%, At least mild AS, Mild MR, Mod LAE, Mod RAE, Trivial PI, PA peak pressure 42 mm Hg. - LHC (4/18) with nonobstructive CAD.  - Echo (10/18): EF 35-40%, diffuse hypokinesis, mild LVH, mild MR, mild AS, PASP 50 mmHg.  - Medtronic PPM with His bundle lead placed.  - Echo (2/20): EF 60-65%, mild LVH, normal RV size and systolic function.  - Echo (6/21): EF 60-65%, mild LVH, RV normal, PASP 47 mmHg, mild AS mean gradient 14 mmHg.  2. Atrial fibrillation: Paroxysmal. On Xarelto and amiodarone.  - Atypical atrial flutter noted 05/28/18.  3. CKD Stage III 4. DMII 5. GERD 6. HLD 7. Hx of CVA - ?2011-2012 with no lasting deficit 8. Chronic anemia 9. CAD: LHC (4/18) with 70% ostial D1, 50% ostial D2, 40% ostial OM1.  10. Aortic stenosis: Mild on echo in 6/21.   11. 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   Current Outpatient Medications  Medication Sig Dispense Refill  . acetaminophen (TYLENOL) 500 MG tablet Take 1,000 mg by mouth every 6 (six) hours as needed for moderate pain or headache.    Marland Kitchen amiodarone (PACERONE) 200 MG tablet TAKE 1 TABLET BY MOUTH DAIY 30 tablet 3  . benzonatate (TESSALON) 100 MG capsule Take 100 mg by mouth 3 (three) times daily as needed for cough.    . doxycycline (VIBRAMYCIN) 100 MG capsule Take 100 mg by mouth 2 (two) times daily.    . empagliflozin (JARDIANCE) 10 MG TABS tablet  Take 1 tablet (10 mg total) by mouth daily before breakfast. 30 tablet 11  . ergocalciferol (VITAMIN D2) 50000 units capsule Take 50,000 Units by mouth every Friday. At night    . ferrous sulfate 325 (65 FE) MG tablet Take 1 tablet (325 mg total) by mouth 2 (two) times daily with a meal. 90 tablet 6  . hydrALAZINE (APRESOLINE) 50 MG tablet TAKE 1&1/2 TABLETS BY MOUTH 3 TIMES DAILY 135 tablet 3  . insulin glargine (LANTUS) 100 UNIT/ML injection Inject 50 Units into the skin daily.    Marland Kitchen ipratropium (ATROVENT HFA) 17 MCG/ACT inhaler Inhale 2 puffs into the lungs every 6 (six) hours as needed for wheezing. 1 Inhaler 12  . isosorbide mononitrate (IMDUR) 30 MG 24 hr tablet TAKE 1 TABLET BY MOUTH DAILY 90 tablet 3  . levothyroxine (SYNTHROID) 50 MCG tablet Take 50 mcg by mouth daily.    . Melatonin 1 MG CAPS Take by mouth daily as needed.    . metoprolol succinate (TOPROL-XL) 100 MG 24 hr tablet TAKE 1 TABLET BY MOUTH DAILY IMMEDIATELYFOLLOWING A MEAL 90 tablet 3  . polyethylene glycol powder (MIRALAX) 17 GM/SCOOP powder Take 17 g by mouth 2 (two) times daily as needed for moderate constipation. 255 g 0  . pravastatin (PRAVACHOL) 40 MG tablet Take 1 tablet (40 mg total) by mouth at bedtime. 90 tablet 3  . senna-docusate (SENOKOT-S) 8.6-50 MG tablet Take 1 tablet by mouth 2 (two) times daily as needed for moderate constipation.    Marland Kitchen venlafaxine (EFFEXOR) 75 MG tablet Take 75 mg by mouth in the morning and at bedtime.    [name] 15 MG TABS tablet Take 1 tablet (15 mg total) by mouth daily with supper. 30 tablet 11  . furosemide (LASIX) 20 MG tablet Take 40mg  (2 tablets) by mouth daily alternating with 20mg  (1 tablet) by mouth daily. 45 tablet 11   No current facility-administered medications for this encounter.   No Known Allergies   Social History   Socioeconomic History  . Marital status: Widowed    Spouse name: Not on file  . Number of children: Not on file  . Years of education: Not on file   . Highest education level: Not on file  Occupational History  . Not on file  Tobacco Use  . Smoking status: Former Research scientist (life sciences)  . Smokeless tobacco: Never Used  . Tobacco comment: 02/28/2017 "only smoked in the 1960s when we went out"  Vaping Use  . Vaping Use: Never used  Substance and Sexual Activity  . Alcohol use: No  . Drug use: No  . Sexual activity: Never  Other Topics Concern  . Not on file  Social History Narrative  . Not on file   Social Determinants of Health   Financial Resource Strain: Not on  file  Food Insecurity: Not on file  Transportation Needs: Not on file  Physical Activity: Not on file  Stress: Not on file  Social Connections: Not on file  Intimate Partner Violence: Not on file   Family history No family history of premature CAD or CHF.   Vitals:   03/23/21 1006  BP: (!) 144/78  Pulse: 64  SpO2: 96%  Weight: 89.7 kg (197 lb 12.8 oz)     Wt Readings from Last 3 Encounters:  03/23/21 89.7 kg (197 lb 12.8 oz)  02/01/21 86.1 kg (189 lb 13.1 oz)  10/16/20 89.5 kg (197 lb 6.4 oz)    PHYSICAL EXAM: General: NAD Neck: JVP 8 cm with HJR, no thyromegaly or thyroid nodule.  Lungs: Clear to auscultation bilaterally with normal respiratory effort. CV: Nondisplaced PMI.  Heart regular S1/S2, no S3/S4, 2/6 SEM with clear S2.  1+ ankle edema.  No carotid bruit.  Normal pedal pulses.  Abdomen: Soft, nontender, no hepatosplenomegaly, no distention.  Skin: Intact without lesions or rashes.  Neurologic: Alert and oriented x 3.  Psych: Normal affect. Extremities: No clubbing or cyanosis.  HEENT: Normal.   ASSESSMENT & PLAN:  1. Chronic systolic=>diastolic HF:  Echo with EF 25-30% 02/2017, repeat echo 10/18 showed EF 35-40%. Nonischemic cardiomyopathy. Medtronic device with His bundle lead was placed.  Most recent echo in 6/21 showed EF up to 60-65% with normal RV.  NYHA III currently. She is at least mildly volume overloaded on exam with elevated volume also  suggested by REDS clip.  - Increase Lasix to 40 mg daily x 3 days, then 40 daily alternating with 20 daily.  BMET today and in 10 days. - Add Jardiance 10 mg daily.    - Continue Toprol XL 100 mg daily.  - Continue hydralazine/Imdur.  2. CKD: Stage III - BMET today.  3. PAF/atrial flutter: No atrial fibrillation on device interrogation.  - Continue amiodarone.  Check TSH and LFTs. She will need regular eye exams.   - Continue Xarelto, CBC today.  4. CAD: Nonobstructive on 4/18 cath.     - Continue pravastatin, good lipids in 5/21.   - No ASA given stable CAD and Xarelto use.  5. Bradycardia: She is s/p Medtronic PPM with His bundle pacing, primarily a-pacing.  6. HTN: BP is high today but generally does not run high. 7. OSA: Suspected.  Arrange for home sleep study.   Followup in 1 month with APP to reassess volume.    Loralie Champagne 03/23/2021

## 2021-03-23 NOTE — Patient Instructions (Addendum)
Labs done today. We will contact you only if your labs are abnormal.  START Jardiance 10mg  (1 tablet) by mouth daily.  INCREASE Lasix to 40mg  (2 tablets) by mouth daily for 3 days THEN take 40mg  (2 tablets) by mouth daily alternating with 20mg  (1 tablet) by mouth daily.   No other medication changes were made. Please continue all current medications as prescribed.  Your provider has recommended that you have a home sleep study.  We have provided you with the equipment in our office today. Please download the app and follow the instructions. YOUR PIN NUMBER IS: [id number]. Once you have completed the test you just dispose of the equipment, the information is automatically uploaded to [place] via blue-tooth technology. If your test is positive for sleep apnea and you need a home CPAP machine you will be contacted by Dr Theodosia Blender office [facility]) to set this up.  Your physician recommends that you schedule a follow-up appointment soon for an echo, 10 days for a lab only appointment and in 1 month for an appointment with our APP [facility] here in office.   Your physician has requested that you have an echocardiogram. Echocardiography is a painless test that uses sound waves to create images of your heart. It provides your doctor with information about the size and shape of your heart and how well your heart's chambers and valves are working. This procedure takes approximately one hour. There are no restrictions for this procedure.  If you have any questions or concerns before your next appointment please send [place] a message through [place] or call our office at [phone].    TO LEAVE A MESSAGE FOR THE NURSE SELECT OPTION 2, PLEASE LEAVE A MESSAGE INCLUDING: . YOUR NAME . DATE OF BIRTH . CALL BACK NUMBER . REASON FOR CALL**this is important as we prioritize the call backs  YOU WILL RECEIVE A CALL BACK THE SAME DAY AS LONG AS YOU CALL BEFORE 4:00 PM   Do the following things EVERYDAY: 1) Weigh yourself  in the morning before breakfast. Write it down and keep it in a log. 2) Take your medicines as prescribed 3) Eat low salt foods--Limit salt (sodium) to 2000 mg per day.  4) Stay as active as you can everyday 5) Limit all fluids for the day to less than 2 liters   At the [facility], you and your health needs are our priority. As part of our continuing mission to provide you with exceptional heart care, we have created designated Provider Care Teams. These Care Teams include your primary Cardiologist (physician) and Advanced Practice Providers (APPs- Physician Assistants and Nurse Practitioners) who all work together to provide you with the care you need, when you need it.   You may see any of the following providers on your designated Care Team at your next follow up: Marland Kitchen Dr Glori Bickers . Dr Loralie Champagne . Darrick Grinder, NP . Lyda Jester, PA . Audry Riles, PharmD   Please be sure to bring in all your medications bottles to every appointment.

## 2021-03-23 NOTE — Progress Notes (Signed)
ReDS Vest / Clip - 03/23/21 1100      ReDS Vest / Clip   Station Marker B    Ruler Value 33    ReDS Value Range Moderate volume overload    ReDS Actual Value 39

## 2021-04-01 ENCOUNTER — Telehealth (HOSPITAL_COMMUNITY): Payer: Self-pay | Admitting: Surgery

## 2021-04-01 NOTE — Telephone Encounter (Signed)
Patient contacted in reference to the home sleep study ordered.  Her daughter says that they have not yet heard about insurance approval and are going to proceed either way.  She tells me that they will pay out of pocket should it be necessary.  I have re-instructed her regarding the test and she plans to proceed.

## 2021-04-02 ENCOUNTER — Telehealth (HOSPITAL_COMMUNITY): Payer: Self-pay | Admitting: *Deleted

## 2021-04-02 ENCOUNTER — Other Ambulatory Visit (HOSPITAL_COMMUNITY): Payer: Self-pay | Admitting: *Deleted

## 2021-04-02 DIAGNOSIS — I5022 Chronic systolic (congestive) heart failure: Secondary | ICD-10-CM

## 2021-04-02 NOTE — Telephone Encounter (Signed)
Lab order faxed to [facility] in [facility] in [place]. Copy of lab order mailed to patient.

## 2021-04-09 ENCOUNTER — Telehealth (HOSPITAL_COMMUNITY): Payer: Self-pay | Admitting: Surgery

## 2021-04-09 NOTE — Telephone Encounter (Signed)
I called regarding patient's ordered home sleep study.  There was no answer and I left a message requesting a call back.  When I last spoke with patient's daughter she had said that they were going to proceed with the home sleep study.

## 2021-04-13 ENCOUNTER — Other Ambulatory Visit: Payer: Self-pay | Admitting: Cardiology

## 2021-04-13 ENCOUNTER — Telehealth (HOSPITAL_COMMUNITY): Payer: Self-pay | Admitting: *Deleted

## 2021-04-13 ENCOUNTER — Institutional Professional Consult (permissible substitution): Payer: Medicare Other | Admitting: Neurology

## 2021-04-13 NOTE — Telephone Encounter (Signed)
pts daughter left vm stating [facility] did not have lab order. I called her back and left vm stating I faxed the order multiple times on 5/20 and mailed a copy to the P.O box in pts chart.

## 2021-04-14 ENCOUNTER — Other Ambulatory Visit (HOSPITAL_COMMUNITY): Payer: Self-pay | Admitting: Cardiology

## 2021-04-14 LAB — BASIC METABOLIC PANEL
BUN/Creatinine Ratio: 15 (ref 12–28)
BUN: 26 mg/dL (ref 8–27)
CO2: 20 mmol/L (ref 20–29)
Calcium: 9 mg/dL (ref 8.7–10.3)
Chloride: 99 mmol/L (ref 96–106)
Creatinine, Ser: 1.71 mg/dL — ABNORMAL HIGH (ref 0.57–1.00)
Glucose: 305 mg/dL — ABNORMAL HIGH (ref 65–99)
Potassium: 3.7 mmol/L (ref 3.5–5.2)
Sodium: 138 mmol/L (ref 134–144)
eGFR: 28 mL/min/{1.73_m2} — ABNORMAL LOW (ref >59)

## 2021-04-26 ENCOUNTER — Ambulatory Visit (INDEPENDENT_AMBULATORY_CARE_PROVIDER_SITE_OTHER): Payer: Medicare Other

## 2021-04-26 DIAGNOSIS — I495 Sick sinus syndrome: Secondary | ICD-10-CM

## 2021-04-29 LAB — CUP PACEART REMOTE DEVICE CHECK
Battery Remaining Longevity: 81 mo
Battery Voltage: 2.99 V
Brady Statistic AP VP Percent: 2.28 %
Brady Statistic AP VS Percent: 97.26 %
Brady Statistic AS VP Percent: 0 %
Brady Statistic AS VS Percent: 0.46 %
Brady Statistic RA Percent Paced: 99.7 %
Brady Statistic RV Percent Paced: 2.28 %
Date Time Interrogation Session: 20220613011321
Implantable Lead Implant Date: 20190107
Implantable Lead Implant Date: 20190107
Implantable Lead Location: 753859
Implantable Lead Location: 753860
Implantable Lead Model: 3830
Implantable Lead Model: 5076
Implantable Pulse Generator Implant Date: 20190107
Lead Channel Impedance Value: 266 Ohm
Lead Channel Impedance Value: 323 Ohm
Lead Channel Impedance Value: 342 Ohm
Lead Channel Impedance Value: 380 Ohm
Lead Channel Pacing Threshold Amplitude: 0.875 V
Lead Channel Pacing Threshold Pulse Width: 0.4 ms
Lead Channel Sensing Intrinsic Amplitude: 1.375 mV
Lead Channel Sensing Intrinsic Amplitude: 1.375 mV
Lead Channel Sensing Intrinsic Amplitude: 1.875 mV
Lead Channel Sensing Intrinsic Amplitude: 1.875 mV
Lead Channel Setting Pacing Amplitude: 2.25 V
Lead Channel Setting Pacing Amplitude: 3 V
Lead Channel Setting Pacing Pulse Width: 1 ms
Lead Channel Setting Sensing Sensitivity: 0.9 mV

## 2021-05-04 ENCOUNTER — Encounter (HOSPITAL_COMMUNITY): Payer: Medicare Other

## 2021-05-04 ENCOUNTER — Ambulatory Visit (HOSPITAL_COMMUNITY): Admission: RE | Admit: 2021-05-04 | Payer: Medicare Other | Source: Ambulatory Visit

## 2021-05-07 ENCOUNTER — Encounter (INDEPENDENT_AMBULATORY_CARE_PROVIDER_SITE_OTHER): Payer: Medicare Other | Admitting: Cardiology

## 2021-05-07 DIAGNOSIS — G4733 Obstructive sleep apnea (adult) (pediatric): Secondary | ICD-10-CM

## 2021-05-11 ENCOUNTER — Telehealth (HOSPITAL_COMMUNITY): Payer: Self-pay | Admitting: *Deleted

## 2021-05-11 NOTE — Telephone Encounter (Signed)
-----   Message from Micki Riley, RN sent at 03/26/2021  3:04 PM EDT ----- Regarding: Soso I wasn't sure if Dyann Ruddle sent over for this patient to get PA for home sleep study or if it was needed?  Also Robet Leu -MRN [id number] --Jenna's note says that he lost insurance and currently doesn't have any?  So I wasn't sure if Dyann Ruddle also sent that one for you review?  Thanks

## 2021-05-11 NOTE — Telephone Encounter (Signed)
Pt was scheduled for sleep consult 5/31 for hallucinations. Pt sleep disorder managed by neurology.

## 2021-05-14 NOTE — Progress Notes (Signed)
Remote pacemaker transmission.   

## 2021-05-18 ENCOUNTER — Telehealth (HOSPITAL_COMMUNITY): Payer: Self-pay | Admitting: Vascular Surgery

## 2021-05-18 NOTE — Telephone Encounter (Signed)
Left pt message about to change appt

## 2021-06-01 ENCOUNTER — Encounter (HOSPITAL_COMMUNITY): Payer: Self-pay

## 2021-06-01 ENCOUNTER — Ambulatory Visit (HOSPITAL_COMMUNITY)
Admission: RE | Admit: 2021-06-01 | Discharge: 2021-06-01 | Disposition: A | Discharge status: Home / Self Care | Payer: Medicare Other | Source: Ambulatory Visit | Attending: Cardiology | Admitting: Cardiology

## 2021-06-01 ENCOUNTER — Ambulatory Visit (HOSPITAL_BASED_OUTPATIENT_CLINIC_OR_DEPARTMENT_OTHER)
Admission: RE | Admit: 2021-06-01 | Discharge: 2021-06-01 | Disposition: A | Discharge status: Home / Self Care | Payer: Medicare Other | Source: Ambulatory Visit | Attending: Cardiology | Admitting: Cardiology

## 2021-06-01 ENCOUNTER — Other Ambulatory Visit: Payer: Self-pay

## 2021-06-01 ENCOUNTER — Telehealth (HOSPITAL_COMMUNITY): Payer: Self-pay | Admitting: *Deleted

## 2021-06-01 VITALS — BP 138/78 | Wt 189.6 lb

## 2021-06-01 DIAGNOSIS — Z794 Long term (current) use of insulin: Secondary | ICD-10-CM | POA: Diagnosis not present

## 2021-06-01 DIAGNOSIS — N183 Chronic kidney disease, stage 3 unspecified: Secondary | ICD-10-CM | POA: Insufficient documentation

## 2021-06-01 DIAGNOSIS — R001 Bradycardia, unspecified: Secondary | ICD-10-CM | POA: Diagnosis not present

## 2021-06-01 DIAGNOSIS — I5022 Chronic systolic (congestive) heart failure: Secondary | ICD-10-CM

## 2021-06-01 DIAGNOSIS — R300 Dysuria: Secondary | ICD-10-CM | POA: Diagnosis not present

## 2021-06-01 DIAGNOSIS — Z79899 Other long term (current) drug therapy: Secondary | ICD-10-CM | POA: Insufficient documentation

## 2021-06-01 DIAGNOSIS — I2510 Atherosclerotic heart disease of native coronary artery without angina pectoris: Secondary | ICD-10-CM | POA: Insufficient documentation

## 2021-06-01 DIAGNOSIS — Z87891 Personal history of nicotine dependence: Secondary | ICD-10-CM | POA: Insufficient documentation

## 2021-06-01 DIAGNOSIS — E1122 Type 2 diabetes mellitus with diabetic chronic kidney disease: Secondary | ICD-10-CM | POA: Diagnosis not present

## 2021-06-01 DIAGNOSIS — Z7901 Long term (current) use of anticoagulants: Secondary | ICD-10-CM | POA: Diagnosis not present

## 2021-06-01 DIAGNOSIS — I480 Paroxysmal atrial fibrillation: Secondary | ICD-10-CM | POA: Diagnosis not present

## 2021-06-01 DIAGNOSIS — E785 Hyperlipidemia, unspecified: Secondary | ICD-10-CM | POA: Diagnosis not present

## 2021-06-01 DIAGNOSIS — R319 Hematuria, unspecified: Secondary | ICD-10-CM | POA: Diagnosis not present

## 2021-06-01 DIAGNOSIS — I350 Nonrheumatic aortic (valve) stenosis: Secondary | ICD-10-CM | POA: Diagnosis not present

## 2021-06-01 DIAGNOSIS — I130 Hypertensive heart and chronic kidney disease with heart failure and stage 1 through stage 4 chronic kidney disease, or unspecified chronic kidney disease: Secondary | ICD-10-CM | POA: Insufficient documentation

## 2021-06-01 DIAGNOSIS — I428 Other cardiomyopathies: Secondary | ICD-10-CM | POA: Diagnosis not present

## 2021-06-01 DIAGNOSIS — I5032 Chronic diastolic (congestive) heart failure: Secondary | ICD-10-CM | POA: Diagnosis not present

## 2021-06-01 DIAGNOSIS — I4892 Unspecified atrial flutter: Secondary | ICD-10-CM | POA: Insufficient documentation

## 2021-06-01 DIAGNOSIS — Z950 Presence of cardiac pacemaker: Secondary | ICD-10-CM | POA: Insufficient documentation

## 2021-06-01 DIAGNOSIS — I252 Old myocardial infarction: Secondary | ICD-10-CM | POA: Diagnosis not present

## 2021-06-01 DIAGNOSIS — I5042 Chronic combined systolic (congestive) and diastolic (congestive) heart failure: Secondary | ICD-10-CM | POA: Diagnosis present

## 2021-06-01 LAB — COMPREHENSIVE METABOLIC PANEL
ALT: 12 U/L (ref 0–44)
AST: 15 U/L (ref 15–41)
Albumin: 3.2 g/dL — ABNORMAL LOW (ref 3.5–5.0)
Alkaline Phosphatase: 111 U/L (ref 38–126)
Anion gap: 7 (ref 5–15)
BUN: 23 mg/dL (ref 8–23)
CO2: 28 mmol/L (ref 22–32)
Calcium: 9.3 mg/dL (ref 8.9–10.3)
Chloride: 107 mmol/L (ref 98–111)
Creatinine, Ser: 1.65 mg/dL — ABNORMAL HIGH (ref 0.44–1.00)
GFR, Estimated: 30 mL/min — ABNORMAL LOW (ref >60)
Glucose, Bld: 215 mg/dL — ABNORMAL HIGH (ref 70–99)
Potassium: 3.1 mmol/L — ABNORMAL LOW (ref 3.5–5.1)
Sodium: 142 mmol/L (ref 135–145)
Total Bilirubin: 0.6 mg/dL (ref 0.3–1.2)
Total Protein: 6.4 g/dL — ABNORMAL LOW (ref 6.5–8.1)

## 2021-06-01 LAB — URINALYSIS, ROUTINE W REFLEX MICROSCOPIC
Bilirubin Urine: NEGATIVE
Glucose, UA: >=500 mg/dL — AB
Ketones, ur: NEGATIVE mg/dL
Leukocytes,Ua: NEGATIVE
Nitrite: NEGATIVE
Protein, ur: 100 mg/dL — AB
RBC / HPF: >50 RBC/hpf — ABNORMAL HIGH (ref 0–5)
Specific Gravity, Urine: 1.02 (ref 1.005–1.030)
pH: 5 (ref 5.0–8.0)

## 2021-06-01 LAB — CBC
HCT: 38.3 % (ref 36.0–46.0)
Hemoglobin: 11.6 g/dL — ABNORMAL LOW (ref 12.0–15.0)
MCH: 29.1 pg (ref 26.0–34.0)
MCHC: 30.3 g/dL (ref 30.0–36.0)
MCV: 96 fL (ref 80.0–100.0)
Platelets: 206 10*3/uL (ref 150–400)
RBC: 3.99 MIL/uL (ref 3.87–5.11)
RDW: 14.9 % (ref 11.5–15.5)
WBC: 7.3 10*3/uL (ref 4.0–10.5)
nRBC: 0 % (ref 0.0–0.2)

## 2021-06-01 LAB — BRAIN NATRIURETIC PEPTIDE: B Natriuretic Peptide: 581.8 pg/mL — ABNORMAL HIGH (ref 0.0–100.0)

## 2021-06-01 LAB — ECHOCARDIOGRAM COMPLETE
AR max vel: 1.14 cm2
AV Area VTI: 1.06 cm2
AV Area mean vel: 1.08 cm2
AV Mean grad: 12.7 mmHg
AV Peak grad: 22.6 mmHg
Ao pk vel: 2.38 m/s
Area-P 1/2: 3.03 cm2
S' Lateral: 3.4 cm

## 2021-06-01 LAB — TSH: TSH: 3.348 u[IU]/mL (ref 0.350–4.500)

## 2021-06-01 MED ORDER — POTASSIUM CHLORIDE CRYS ER 20 MEQ PO TBCR: 20.0000 meq | EXTENDED_RELEASE_TABLET | Freq: Every day | ORAL | 6 refills | Status: DC

## 2021-06-01 MED ORDER — CEPHALEXIN 250 MG PO CAPS: 250.0000 mg | ORAL_CAPSULE | Freq: Three times a day (TID) | ORAL | 0 refills | Status: AC

## 2021-06-01 NOTE — Telephone Encounter (Signed)
Spoke w/pt's daughter, she is aware, agreeable, and verbalized understanding. RX for Keflex and KCL sent int, order for labs in 1 week faxed to [facility] in [place], if UTI symptoms not improving they will f/u w/PCP, copy sent to PCPfor their records.

## 2021-06-01 NOTE — Progress Notes (Signed)
Advanced [facility] Failure Clinic Note   Primary Care: [facility], [place] HF Cardiology: Dr. Aundra Dubin  HPI:  Shannon Lucas is a 87 y.o. female with PMH of Systolic CHF,  PAF on Xarelto, CKD stage III, DM II, GERD, HLD, CVA, anemia.   Admitted 4/17 -> 0/81/44 with A/C systolic CHF. L/RHC as below with normal coronaries and elevated filling pressures. Diuresed 5 lbs. Hospital course complicated by hematoma at radial cath site that improved overnight. Medications adjusted as tolerated. Discharge weight 205 lbs.    Pt seen in ED 03/08/17 for worsening of hematoma as above.  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 Dr. Donnetta Hutching saw in consult and repaired under local anaesthesia and sedation.   Echo in 10/18 showed EF 35-40% with diffuse hypokinesis, PASP 50 mmHg.  Holter in 10/18 showed runs of atrial fibrillation including afib with aberrancy.  Bradycardia into the 30s was also noted.  Decision was made to place PPM => patient got Medtronic device with His bundle lead and is now His pacing.    Echo in 2/20 showed EF up to 60-65%, mild LVH, normal RV.   She was admitted in 4/21 with fall, rib fractures, sepsis due to UTI, and CHF exacerbation. She was in and out of atrial fibrillation in the hospital.  She was diuresed.   Echo in 6/21 with EF 60-65%, normal RV, mild AS.   She was admitted in 3/22 with delirium and hallucinations, ?related to sinusitis.   Recently seen by Dr. Aundra Dubin 03/23/21 and was fluid overloaded, ReDs Clip 39%. Diuretic regimen adjusted. Lasix was increased to 40 mg daily x 3 days, then 40 daily alternating with 20 daily. Jardiance 10 mg daily also added. Repeat echo was ordered. Also advised to get sleep study due to suspected OSA.   She returns today for f/u. Here w/ her daughter. Echo was completed today, interpretation pending. Wt is down 8 lb, from 197>>189 lb. ReDs clip however still elevated at 38%. She reports she stopped taking Jardiance because she developed weakness and fatigue after  starting it. Very little energy. Also concern for UTI. Noted some slight hematuria yesterday + left mid back pain. No CVA tenderness. Urine also cloudy. Denies fever and chills.   REDS clip 38%  EKG: A-paced 61 bpm, personally reviewed   Medtronic PPM interrogation: attempted to obtain device interrogation, unable to generate report due to network system error   Labs (04/06/17): K 5.0, Creatinine 2.04, BUN 43, LFTs normal Labs (6/18): K 3.9, creatinine 1.68 Labs (8/18): LDL 83, HDL 51, hgb 11.1, K 4.5, creatinine 1.48, LFTs normal, TSH normal.  Labs (1/19): K 4, creatinine 1.5, hgb 12.2, TSH normal, LFTs normal Labs (7/19): K 4.3, creatinine 1.78, LDL 105, TSH normal, LFTs normal Labs (1/20): K 3.9, creatinine 1.95 Labs (4/21): K 3.9, creatinine 1.86 Labs (5/21): LDL 64 Labs (6/21): K 4.1, creatinine 1.8, hgb 9.8 Labs (8/21): K 3.5, creatinine 1.75 Labs (11/21): LDL 66 Labs (3/22): K 4, creatinine 1.7 Labs (03/23/21): K 3.9, SCr 1.48 Labs (04/13/21): K 3.7, SCr 1.71  Review of systems complete and found to be negative unless listed in HPI.    PMH 1. Chronic systolic CHF: Nonischemic cardiomyopathy.  - Echo 03/01/17 LVEF 25-30%, At least mild AS, Mild MR, Mod LAE, Mod RAE, Trivial PI, PA peak pressure 42 mm Hg. - LHC (4/18) with nonobstructive CAD.  - Echo (10/18): EF 35-40%, diffuse hypokinesis, mild LVH, mild MR, mild AS, PASP 50 mmHg.  - Medtronic PPM with His bundle  lead placed.  - Echo (2/20): EF 60-65%, mild LVH, normal RV size and systolic function.  - Echo (6/21): EF 60-65%, mild LVH, RV normal, PASP 47 mmHg, mild AS mean gradient 14 mmHg.  2. Atrial fibrillation: Paroxysmal. On Xarelto and amiodarone.  - Atypical atrial flutter noted 05/28/18.  3. CKD Stage III 4. DMII 5. GERD 6. HLD 7. Hx of CVA - ?2011-2012 with no lasting deficit 8. Chronic anemia 9. CAD: LHC (4/18) with 70% ostial D1, 50% ostial D2, 40% ostial OM1.  10. Aortic stenosis: Mild on echo in 6/21.   11.  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   Current Outpatient Medications  Medication Sig Dispense Refill   acetaminophen (TYLENOL) 500 MG tablet Take 1,000 mg by mouth every 6 (six) hours as needed for moderate pain or headache.     amiodarone (PACERONE) 200 MG tablet TAKE 1 TABLET BY MOUTH DAIY 30 tablet 3   benzonatate (TESSALON) 100 MG capsule Take 100 mg by mouth 3 (three) times daily as needed for cough.     ergocalciferol (VITAMIN D2) 50000 units capsule Take 50,000 Units by mouth every Friday. At night     ferrous sulfate 325 (65 FE) MG tablet Take 1 tablet (325 mg total) by mouth 2 (two) times daily with a meal. 90 tablet 6   furosemide (LASIX) 20 MG tablet Take 40mg  (2 tablets) by mouth daily alternating with 20mg  (1 tablet) by mouth daily. 45 tablet 11   hydrALAZINE (APRESOLINE) 50 MG tablet TAKE 1&1/2 TABLETS BY MOUTH 3 TIMES DAILY 135 tablet 3   insulin glargine (LANTUS) 100 UNIT/ML injection Inject 50 Units into the skin daily.     ipratropium (ATROVENT HFA) 17 MCG/ACT inhaler Inhale 2 puffs into the lungs every 6 (six) hours as needed for wheezing. 1 Inhaler 12   isosorbide mononitrate (IMDUR) 30 MG 24 hr tablet TAKE 1 TABLET BY MOUTH DAILY 90 tablet 3   levothyroxine (SYNTHROID) 50 MCG tablet Take 50 mcg by mouth daily.     Melatonin 1 MG CAPS Take by mouth daily as needed.     metoprolol succinate (TOPROL-XL) 100 MG 24 hr tablet TAKE 1 TABLET BY MOUTH DAILY IMMEDIATELYFOLLOWING A MEAL 90 tablet 3   polyethylene glycol powder (MIRALAX) 17 GM/SCOOP powder Take 17 g by mouth 2 (two) times daily as needed for moderate constipation. 255 g 0   pravastatin (PRAVACHOL) 40 MG tablet Take 1 tablet (40 mg total) by mouth at bedtime. 90 tablet 3   senna-docusate (SENOKOT-S) 8.6-50 MG tablet Take 1 tablet by mouth 2 (two) times daily as needed for moderate constipation.     venlafaxine (EFFEXOR) 75 MG tablet Take 75 mg by mouth in the morning and at bedtime.     XARELTO 15 MG TABS  tablet Take 1 tablet (15 mg total) by mouth daily with supper. 30 tablet 11   No current facility-administered medications for this encounter.   No Known Allergies   Social History   Socioeconomic History   Marital status: Widowed    Spouse name: Not on file   Number of children: Not on file   Years of education: Not on file   Highest education level: Not on file  Occupational History   Not on file  Tobacco Use   Smoking status: Former   Smokeless tobacco: Never   Tobacco comments:    02/28/2017 "only smoked in the 1960s when we went out"  Vaping Use   Vaping Use: Never used  Substance and Sexual Activity   Alcohol use: No   Drug use: No   Sexual activity: Never  Other Topics Concern   Not on file  Social History Narrative   Not on file   Social Determinants of Health   Financial Resource Strain: Not on file  Food Insecurity: Not on file  Transportation Needs: Not on file  Physical Activity: Not on file  Stress: Not on file  Social Connections: Not on file  Intimate Partner Violence: Not on file   Family history No family history of premature CAD or CHF.   Vitals:   06/01/21 1208  BP: 138/78  Weight: 86 kg (189 lb 9.6 oz)      Wt Readings from Last 3 Encounters:  06/01/21 86 kg (189 lb 9.6 oz)  03/23/21 89.7 kg (197 lb 12.8 oz)  02/01/21 86.1 kg (189 lb 13.1 oz)    PHYSICAL EXAM: ReDs Clip 38% General:  Well appearing elderly WF. No respiratory difficulty HEENT: normal Neck: supple. JVD 8 cm. Carotids 2+ bilat; no bruits. No lymphadenopathy or thyromegaly appreciated. Cor: PMI nondisplaced. Regular rate & rhythm. No rubs, gallops or murmurs. Lungs: clear Abdomen: obese, soft, nontender, nondistended. No hepatosplenomegaly. No bruits or masses. Good bowel sounds. No CVA tenderness  Extremities: no cyanosis, clubbing, rash, edema Neuro: alert & oriented x 3, cranial nerves grossly intact. moves all 4 extremities w/o difficulty. Affect  pleasant.   ASSESSMENT & PLAN:  1. Chronic systolic=>diastolic HF:  Echo with EF 25-30% 02/2017, repeat echo 10/18 showed EF 35-40%. Nonischemic cardiomyopathy. Medtronic device with His bundle lead was placed.  Most recent echo in 6/21 showed EF up to 60-65% with normal RV.  NYHA III currently. Remains fluid overloaded, ReDs Clip 38%. Echo repeated today, interpretation pending.  - Increase Lasix to 40 mg daily x 2 days, then back to alternating doses of 20-40 daily - intolerant to jardiance (weakness and fatigue, also concern for UTI) - Continue Toprol XL 100 mg daily.  - Continue hydralazine/Imdur.  2. CKD: Stage III - baseline SCr ~1.7 - check BMP today  3. PAF/atrial flutter: No atrial fibrillation on recent device interrogation. A-paced on EKG today. HR controlled.  - Continue amiodarone.  Check TSH and LFTs. She will need regular eye exams.   - Continue Xarelto. Denies abnormal bleeding. Check CBC today  4. CAD: Nonobstructive on 4/18 cath.     - Continue pravastatin, good lipids in 5/21.   - No ASA given stable CAD and Xarelto use.  5. Bradycardia: She is s/p Medtronic PPM with His bundle pacing, primarily a-pacing.  6. HTN: controlled on current regimen  7. OSA: Suspected.  Arrange for home sleep study.  8. ? UTI - check UA + UC - will stay off of Jardiance for now   F/u in 4 weeks w/ APP   Lyda Jester, PA-C  06/01/2021

## 2021-06-01 NOTE — Patient Instructions (Signed)
Labs and urine test done today, we will call you for abnormal results  Your physician recommends that you schedule a follow-up appointment in: 4 weeks  If you have any questions or concerns before your next appointment please send [place] a message through [place] or call our office at [phone].    TO LEAVE A MESSAGE FOR THE NURSE SELECT OPTION 2, PLEASE LEAVE A MESSAGE INCLUDING: YOUR NAME DATE OF BIRTH CALL BACK NUMBER REASON FOR CALL**this is important as we prioritize the call backs  YOU WILL RECEIVE A CALL BACK THE SAME DAY AS LONG AS YOU CALL BEFORE 4:00 PM  milAt the [facility], you and your health needs are our priority. As part of our continuing mission to provide you with exceptional heart care, we have created designated Provider Care Teams. These Care Teams include your primary Cardiologist (physician) and Advanced Practice Providers (APPs- Physician Assistants and Nurse Practitioners) who all work together to provide you with the care you need, when you need it.   You may see any of the following providers on your designated Care Team at your next follow up: Dr Glori Bickers Dr Loralie Champagne Dr Patrice Paradise, NP Lyda Jester, [place] Ginnie Smart Audry Riles, PharmD   Please be sure to bring in all your medications bottles to every appointment.

## 2021-06-01 NOTE — Telephone Encounter (Signed)
-----   Message from [place], [place] sent at 06/01/2021  5:29 PM EDT ----- Start Keflex 250 mg tid x 7 days for UTI   Sill mildly volume overloaded, renal function is stable. Increase lasix to 40 mg daily x 2 days then return to alternating doses 20-40 mg daily. Add KCl 20 mEq daily. Repeat BMP in 7 days.   Also advise that she f/u w/ PCP in 1 week for f/u for UTI.

## 2021-06-01 NOTE — Progress Notes (Signed)
ReDS Vest / Clip - 06/01/21 1200       ReDS Vest / Clip   Station Marker A    Ruler Value 28    ReDS Value Range Moderate volume overload    ReDS Actual Value 38

## 2021-06-02 ENCOUNTER — Telehealth (HOSPITAL_COMMUNITY): Payer: Self-pay | Admitting: Surgery

## 2021-06-02 NOTE — Telephone Encounter (Signed)
I spoke to patient during clinic appointment yesterday regarding the ordered home sleep study.  The patient's daughter reports that she attempted the study and the patient pulled off the equipment during the night.  She also says that they do not wish to repeat the home test.

## 2021-06-03 LAB — URINE CULTURE: Culture: >=100000 — AB

## 2021-06-09 ENCOUNTER — Other Ambulatory Visit (HOSPITAL_COMMUNITY): Payer: Self-pay | Admitting: Cardiology

## 2021-06-15 ENCOUNTER — Ambulatory Visit: Payer: Medicare Other

## 2021-06-15 DIAGNOSIS — G4733 Obstructive sleep apnea (adult) (pediatric): Secondary | ICD-10-CM

## 2021-06-15 NOTE — Procedures (Signed)
   Sleep Study Report  Patient Information Study Date: Apr 06, 2021 Patient Name: Shannon Lucas Patient ID: [id number] Birth Date: [birth year] Age: 87 Gender: Female Referring Physician: Loralie Champagne, MD  TEST DESCRIPTION: Home sleep apnea testing was completed using the WatchPat, a Type 1 device, utilizing peripheral arterial tonometry (PAT), chest movement, actigraphy, pulse oximetry, pulse rate, body position and snore. AHI was calculated with apnea and hypopnea using valid sleep time as the denominator. RDI includes apneas, hypopneas, and RERAs. The data acquired and the scoring of sleep and all associated events were performed in accordance with the recommended standards and specifications as outlined in the AASM Manual for the Scoring of Sleep and Associated Events 2.2.0 (2015).  FINDINGS: 1. Severe Obstructive Sleep Apnea with AHI 37.5/hr. 2. Moderate Central Sleep Apnea with pAHIc 21.3/hr with 53.4% Cheyne Stokes Respirations. 3. Oxygen desaturations as low as 86%. 4. Severe snoring was present. O2 sats were < 88% for 0.1 min. 5. Total sleep time was 1 hrs and 35 min. 6. 0% of total sleep time was spent in REM sleep. 7. Prolonged sleep onset latency at 33 min. 8. No RE sleep obtained. 9. Total awakenings were 3.  DIAGNOSIS: Severe Obstructive Sleep Apnea (G47.33)  RECOMMENDATIONS:  1. Clinical correlation of these findings is necessary. The decision to treat obstructive sleep apnea (OSA) is usually based on the presence of apnea symptoms or the presence of associated medical conditions such as Hypertension, Congestive Heart Failure, Atrial Fibrillation or Obesity. The most common symptoms of OSA are snoring, gasping for breath while sleeping, daytime sleepiness and fatigue.  2. Initiating apnea therapy is recommended given the presence of symptoms and/or associated conditions. Recommend proceeding with one of the following:   a. Auto-CPAP therapy with a pressure  range of 5-20cm H2O.   b. An oral appliance (OA) that can be obtained from certain dentists with expertise in sleep medicine. These are primarily of use in non-obese patients with mild and moderate disease.   c. An ENT consultation which may be useful to look for specific causes of obstruction and possible treatment options.   d. If patient is intolerant to PAP therapy, consider referral to ENT for evaluation for hypoglossal nerve stimulator.  3. Close follow-up is necessary to ensure success with CPAP or oral appliance therapy for maximum benefit .  4. A follow-up oximetry study on CPAP is recommended to assess the adequacy of therapy and determine the need for supplemental oxygen or the potential need for Bi-level therapy. An arterial blood gas to determine the adequacy of baseline ventilation and oxygenation should also be considered.  5. Healthy sleep recommendations include: adequate nightly sleep (normal 7-9 hrs/night), avoidance of caffeine after noon and alcohol near bedtime, and maintaining a sleep environment that is cool, dark and quiet.  6. Weight loss for overweight patients is recommended. Even modest amounts of weight loss can significantly improve the severity of sleep apnea.  7. Snoring recommendations include: weight loss where appropriate, side sleeping, and avoidance of alcohol before bed.  8. Operation of motor vehicle or dangerous equipment must be avoided when feeling drowsy, excessively sleepy, or mentally fatigued.  Signature: Electronically Signed: Jun 15, 2021 Fransico Him, MD; [facility]; [place], [place] Board of Sleep Medicine Report prepared by: Fransico Him, MD

## 2021-06-22 ENCOUNTER — Encounter: Payer: Self-pay | Admitting: Cardiology

## 2021-06-22 ENCOUNTER — Other Ambulatory Visit: Payer: Self-pay

## 2021-06-22 DIAGNOSIS — G4733 Obstructive sleep apnea (adult) (pediatric): Secondary | ICD-10-CM

## 2021-06-22 NOTE — Progress Notes (Signed)
MRI clearance form faxed to [facility] MRI department.  See scanned media for details.  RV lead is HiS Bundle lead with chronically elevated threshold, last in-clinic result was 1.5V@1ms 

## 2021-06-30 ENCOUNTER — Encounter (HOSPITAL_COMMUNITY): Payer: Medicare Other

## 2021-07-01 ENCOUNTER — Telehealth: Payer: Self-pay | Admitting: *Deleted

## 2021-07-01 DIAGNOSIS — G4733 Obstructive sleep apnea (adult) (pediatric): Secondary | ICD-10-CM

## 2021-07-01 NOTE — Telephone Encounter (Signed)
-----   Message from Sueanne Margarita, MD sent at 06/15/2021  3:56 PM EDT ----- Please let patient know that they have sleep apnea.  Recommend therapeutic CPAP titration for treatment of patient's sleep disordered breathing.  If unable to perform an in lab titration then initiate ResMed auto CPAP from 4 to 15cm H2O with heated humidity and mask of choice and overnight pulse ox on CPAP.

## 2021-07-01 NOTE — Telephone Encounter (Signed)
The patient has been notified of the result and verbalized understanding.  All questions (if any) were answered. Marolyn Hammock, [place] 07/01/2021 9:55 AM    Titration to be precerted.  The following solutions for the service date entered do not require Pre-Authorization by AIM. JOU.F 07/01/21

## 2021-07-09 NOTE — Addendum Note (Signed)
Addended by: Freada Bergeron on: 07/09/2021 12:15 PM   Modules accepted: Orders

## 2021-07-12 NOTE — Progress Notes (Signed)
Advanced Heart Failure Clinic Note   Primary Care: [facility] Prude, [place] HF Cardiology: Dr. Aundra Dubin  HPI:  Shannon Lucas is a 87 y.o. female with PMH of Systolic CHF,  PAF on Xarelto, CKD stage III, DM II, GERD, HLD, CVA, anemia.   Admitted 4/17 -> 1/60/73 with A/C systolic CHF. L/RHC as below with normal coronaries and elevated filling pressures. Diuresed 5 lbs. Hospital course complicated by hematoma at radial cath site that improved overnight. Medications adjusted as tolerated. Discharge weight 205 lbs.    Pt seen in ED 03/08/17 for worsening of hematoma as above.  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 Dr. Donnetta Hutching saw in consult and repaired under local anaesthesia and sedation.   Echo in 10/18 showed EF 35-40% with diffuse hypokinesis, PASP 50 mmHg.  Holter in 10/18 showed runs of atrial fibrillation including afib with aberrancy.  Bradycardia into the 30s was also noted.  Decision was made to place PPM => patient got Medtronic device with His bundle lead and is now His pacing.    Echo in 2/20 showed EF up to 60-65%, mild LVH, normal RV.   She was admitted in 4/21 with fall, rib fractures, sepsis due to UTI, and CHF exacerbation. She was in and out of atrial fibrillation in the hospital.  She was diuresed.   Echo in 6/21 with EF 60-65%, normal RV, mild AS.   She was admitted in 3/22 with delirium and hallucinations, ?related to sinusitis.   She was overloaded at her follow up on 03/23/21, ReDs Clip 39%. Lasix was increased to 40 mg daily x 3 days, then 40 daily alternating with 20 daily. Jardiance 10 mg daily also added. Repeat echo was ordered. Also advised to get sleep study due to suspected OSA.   Jardiance stopped due to weakness, fatigue, and UTI 6/22.  Echo (7/22) EF 55-60% with mild aortic stenosis.  Today she returns for HF follow up, here with her daughter. Overall feeling fine. She uses a rolling walker to get around the house, no significant exertional dyspnea. Occasional dizziness but  no falls. Denies CP, dizziness, edema, or PND/Orthopnea. Appetite ok. No fever or chills. Weight at home 180 pounds. Taking all medications. She receives PT at home 2-3 x/week. She lives on the same property as her daughter and her daughter checks in on her.   Medtronic PPM interrogation: No recent AF/AT, predominately a-paces, 1-2 hrs/day activity,   Labs (04/06/17): K 5.0, Creatinine 2.04, BUN 43, LFTs normal Labs (6/18): K 3.9, creatinine 1.68 Labs (8/18): LDL 83, HDL 51, hgb 11.1, K 4.5, creatinine 1.48, LFTs normal, TSH normal.  Labs (1/19): K 4, creatinine 1.5, hgb 12.2, TSH normal, LFTs normal Labs (7/19): K 4.3, creatinine 1.78, LDL 105, TSH normal, LFTs normal Labs (1/20): K 3.9, creatinine 1.95 Labs (4/21): K 3.9, creatinine 1.86 Labs (5/21): LDL 64 Labs (6/21): K 4.1, creatinine 1.8, hgb 9.8 Labs (8/21): K 3.5, creatinine 1.75 Labs (11/21): LDL 66 Labs (3/22): K 4, creatinine 1.7 Labs (03/23/21): K 3.9, SCr 1.48 Labs (04/13/21): K 3.7, SCr 1.71 Labs (8/22): K4.8, creatinine 1.79  Review of systems complete and found to be negative unless listed in HPI.    PMH 1. Chronic systolic CHF: Nonischemic cardiomyopathy.  - Echo 03/01/17 LVEF 25-30%, At least mild AS, Mild MR, Mod LAE, Mod RAE, Trivial PI, PA peak pressure 42 mm Hg. - LHC (4/18) with nonobstructive CAD.  - Echo (10/18): EF 35-40%, diffuse hypokinesis, mild LVH, mild MR, mild AS, PASP 50 mmHg.  -  Medtronic PPM with His bundle lead placed.  - Echo (2/20): EF 60-65%, mild LVH, normal RV size and systolic function.  - Echo (6/21): EF 60-65%, mild LVH, RV normal, PASP 47 mmHg, mild AS mean gradient 14 mmHg.  2. Atrial fibrillation: Paroxysmal. On Xarelto and amiodarone.  - Atypical atrial flutter noted 05/28/18.  3. CKD Stage III 4. DMII 5. GERD 6. HLD 7. Hx of CVA - ?2011-2012 with no lasting deficit 8. Chronic anemia 9. CAD: LHC (4/18) with 70% ostial D1, 50% ostial D2, 40% ostial OM1.  10. Aortic stenosis: Mild on  echo in 6/21.   11. 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   Current Outpatient Medications  Medication Sig Dispense Refill   acetaminophen (TYLENOL) 500 MG tablet Take 1,000 mg by mouth every 6 (six) hours as needed for moderate pain or headache.     amiodarone (PACERONE) 200 MG tablet TAKE 1 TABLET BY MOUTH DAIY 30 tablet 3   benzonatate (TESSALON) 100 MG capsule Take 100 mg by mouth 3 (three) times daily as needed for cough.     ergocalciferol (VITAMIN D2) 50000 units capsule Take 50,000 Units by mouth every Friday. At night     ferrous sulfate 325 (65 FE) MG tablet Take 1 tablet (325 mg total) by mouth 2 (two) times daily with a meal. 90 tablet 6   furosemide (LASIX) 20 MG tablet Take 40mg  (2 tablets) by mouth daily alternating with 20mg  (1 tablet) by mouth daily. 45 tablet 11   hydrALAZINE (APRESOLINE) 50 MG tablet TAKE 1&1/2 TABLETS BY MOUTH 3 TIMES DAILY 135 tablet 3   insulin glargine (LANTUS) 100 UNIT/ML injection Inject 50 Units into the skin daily.     ipratropium (ATROVENT HFA) 17 MCG/ACT inhaler Inhale 2 puffs into the lungs every 6 (six) hours as needed for wheezing. 1 Inhaler 12   isosorbide mononitrate (IMDUR) 30 MG 24 hr tablet TAKE 1 TABLET BY MOUTH DAILY 90 tablet 3   levothyroxine (SYNTHROID) 50 MCG tablet Take 50 mcg by mouth daily.     Melatonin 1 MG CAPS Take by mouth daily as needed.     metoprolol succinate (TOPROL-XL) 100 MG 24 hr tablet TAKE 1 TABLET BY MOUTH DAILY IMMEDIATELYFOLLOWING A MEAL 90 tablet 3   polyethylene glycol powder (MIRALAX) 17 GM/SCOOP powder Take 17 g by mouth 2 (two) times daily as needed for moderate constipation. 255 g 0   potassium chloride SA (KLOR-CON) 20 MEQ tablet Take 1 tablet (20 mEq total) by mouth daily. 30 tablet 6   pravastatin (PRAVACHOL) 40 MG tablet TAKE 1 TABLET BY MOUTH AT BEDTIME 90 tablet 3   senna-docusate (SENOKOT-S) 8.6-50 MG tablet Take 1 tablet by mouth 2 (two) times daily as needed for moderate constipation.      venlafaxine (EFFEXOR) 75 MG tablet Take 75 mg by mouth in the morning and at bedtime.     XARELTO 15 MG TABS tablet Take 1 tablet (15 mg total) by mouth daily with supper. 30 tablet 11   No current facility-administered medications for this encounter.   No Known Allergies   Social History   Socioeconomic History   Marital status: Widowed    Spouse name: Not on file   Number of children: Not on file   Years of education: Not on file   Highest education level: Not on file  Occupational History   Not on file  Tobacco Use   Smoking status: Former   Smokeless tobacco: Never   Tobacco  comments:    02/28/2017 "only smoked in the 1960s when we went out"  Vaping Use   Vaping Use: Never used  Substance and Sexual Activity   Alcohol use: No   Drug use: No   Sexual activity: Never  Other Topics Concern   Not on file  Social History Narrative   Not on file   Social Determinants of Health   Financial Resource Strain: Not on file  Food Insecurity: Not on file  Transportation Needs: Not on file  Physical Activity: Not on file  Stress: Not on file  Social Connections: Not on file  Intimate Partner Violence: Not on file   Family history No family history of premature CAD or CHF.   BP 130/78   Pulse 69   Wt 84 kg (185 lb 4 oz)   SpO2 97%   BMI 30.83 kg/m   Wt Readings from Last 3 Encounters:  07/13/21 84 kg (185 lb 4 oz)  06/01/21 86 kg (189 lb 9.6 oz)  03/23/21 89.7 kg (197 lb 12.8 oz)    PHYSICAL EXAM: General:  NAD. No resp difficulty, well-appearing, walked into clinic with RW HEENT: Normal, HOH Neck: Supple. No JVD. Carotids 2+ bilat; no bruits. No lymphadenopathy or thryomegaly appreciated. Cor: PMI nondisplaced. Regular rate & rhythm. No rubs, gallops or murmurs. Lungs: Clear Abdomen: Obese, nontender, nondistended. No hepatosplenomegaly. No bruits or masses. Good bowel sounds. Extremities: No cyanosis, clubbing, rash, edema Neuro: Alert & oriented x 3, cranial  nerves grossly intact. Moves all 4 extremities w/o difficulty. Affect pleasant.  ASSESSMENT & PLAN: 1. Chronic systolic=>diastolic HF:  Echo with EF 25-30% 02/2017, repeat echo 10/18 showed EF 35-40%. Nonischemic cardiomyopathy. Medtronic device with His bundle lead was placed.  Echo in 6/21 showed EF up to 60-65% with normal RV.  Echo (7/22) EF 55-60%, normal RV, mild aortic stenosis. NYHA II currently. She is not volume overloaded on exam. - Continue lasix 40 mg daily alternating with 20 mg daily. - Continue Toprol XL 100 mg daily.  - Continue hydralazine 75 mg tid + Imdur 30 mg daily.  - Intolerant to Jardiance (weakness and fatigue, also concern for UTI). 2. CKD: Stage III - Baseline SCr ~1.7 - SCr 1.79 (8/22). 3. PAF/atrial flutter: No atrial fibrillation on recent device interrogation. HR controlled.  - Continue amiodarone.  TSH and LFTs ok 7/22. She will need regular eye exams.   - Continue Xarelto. Denies abnormal bleeding.  4. CAD: Nonobstructive on 4/18 cath.     - Continue pravastatin, good lipids in 5/21.   - No ASA given stable CAD and Xarelto use.  5. Bradycardia: She is s/p Medtronic PPM with His bundle pacing, primarily a-pacing.  6. HTN: controlled on current regimen  7. OSA: Suspected.  Sleep study attempted with the help of daughter, however she took it off in the middle of the night.  - Patient and daughter do not want to repeat testing at this time.   F/u in 2-3 months with Dr. Aundra Dubin.  [place], FNP 07/13/2021 4:59 PM

## 2021-07-13 ENCOUNTER — Ambulatory Visit (HOSPITAL_COMMUNITY)
Admission: RE | Admit: 2021-07-13 | Discharge: 2021-07-13 | Disposition: A | Discharge status: Home / Self Care | Payer: Medicare Other | Source: Ambulatory Visit | Attending: Family Medicine | Admitting: Family Medicine

## 2021-07-13 ENCOUNTER — Other Ambulatory Visit: Payer: Self-pay

## 2021-07-13 ENCOUNTER — Encounter (HOSPITAL_COMMUNITY): Payer: Self-pay

## 2021-07-13 VITALS — BP 130/78 | HR 69 | Wt 185.2 lb

## 2021-07-13 DIAGNOSIS — E785 Hyperlipidemia, unspecified: Secondary | ICD-10-CM | POA: Diagnosis not present

## 2021-07-13 DIAGNOSIS — I350 Nonrheumatic aortic (valve) stenosis: Secondary | ICD-10-CM | POA: Diagnosis not present

## 2021-07-13 DIAGNOSIS — N183 Chronic kidney disease, stage 3 unspecified: Secondary | ICD-10-CM | POA: Diagnosis not present

## 2021-07-13 DIAGNOSIS — R001 Bradycardia, unspecified: Secondary | ICD-10-CM | POA: Insufficient documentation

## 2021-07-13 DIAGNOSIS — I5022 Chronic systolic (congestive) heart failure: Secondary | ICD-10-CM

## 2021-07-13 DIAGNOSIS — I130 Hypertensive heart and chronic kidney disease with heart failure and stage 1 through stage 4 chronic kidney disease, or unspecified chronic kidney disease: Secondary | ICD-10-CM | POA: Insufficient documentation

## 2021-07-13 DIAGNOSIS — I495 Sick sinus syndrome: Secondary | ICD-10-CM

## 2021-07-13 DIAGNOSIS — I5032 Chronic diastolic (congestive) heart failure: Secondary | ICD-10-CM

## 2021-07-13 DIAGNOSIS — Z7901 Long term (current) use of anticoagulants: Secondary | ICD-10-CM | POA: Insufficient documentation

## 2021-07-13 DIAGNOSIS — Z87440 Personal history of urinary (tract) infections: Secondary | ICD-10-CM | POA: Diagnosis not present

## 2021-07-13 DIAGNOSIS — Z79899 Other long term (current) drug therapy: Secondary | ICD-10-CM | POA: Insufficient documentation

## 2021-07-13 DIAGNOSIS — I2510 Atherosclerotic heart disease of native coronary artery without angina pectoris: Secondary | ICD-10-CM | POA: Diagnosis not present

## 2021-07-13 DIAGNOSIS — I480 Paroxysmal atrial fibrillation: Secondary | ICD-10-CM | POA: Diagnosis not present

## 2021-07-13 DIAGNOSIS — I4892 Unspecified atrial flutter: Secondary | ICD-10-CM | POA: Insufficient documentation

## 2021-07-13 DIAGNOSIS — I428 Other cardiomyopathies: Secondary | ICD-10-CM | POA: Diagnosis not present

## 2021-07-13 DIAGNOSIS — Z87891 Personal history of nicotine dependence: Secondary | ICD-10-CM | POA: Insufficient documentation

## 2021-07-13 DIAGNOSIS — Z794 Long term (current) use of insulin: Secondary | ICD-10-CM | POA: Diagnosis not present

## 2021-07-13 DIAGNOSIS — G4733 Obstructive sleep apnea (adult) (pediatric): Secondary | ICD-10-CM

## 2021-07-13 DIAGNOSIS — I10 Essential (primary) hypertension: Secondary | ICD-10-CM

## 2021-07-13 DIAGNOSIS — Z8673 Personal history of transient ischemic attack (TIA), and cerebral infarction without residual deficits: Secondary | ICD-10-CM | POA: Diagnosis not present

## 2021-07-13 DIAGNOSIS — Z950 Presence of cardiac pacemaker: Secondary | ICD-10-CM | POA: Diagnosis not present

## 2021-07-13 DIAGNOSIS — E1122 Type 2 diabetes mellitus with diabetic chronic kidney disease: Secondary | ICD-10-CM | POA: Insufficient documentation

## 2021-07-13 NOTE — Patient Instructions (Signed)
Your physician recommends that you schedule a follow-up appointment in: 3 months  If you have any questions or concerns before your next appointment please send [place] a message through [place] or call our office at [phone].    TO LEAVE A MESSAGE FOR THE NURSE SELECT OPTION 2, PLEASE LEAVE A MESSAGE INCLUDING: YOUR NAME DATE OF BIRTH CALL BACK NUMBER REASON FOR CALL**this is important as we prioritize the call backs  YOU WILL RECEIVE A CALL BACK THE SAME DAY AS LONG AS YOU CALL BEFORE 4:00 PM  At the [facility], you and your health needs are our priority. As part of our continuing mission to provide you with exceptional heart care, we have created designated Provider Care Teams. These Care Teams include your primary Cardiologist (physician) and Advanced Practice Providers (APPs- Physician Assistants and Nurse Practitioners) who all work together to provide you with the care you need, when you need it.   You may see any of the following providers on your designated Care Team at your next follow up: Dr Glori Bickers Dr Loralie Champagne Dr Patrice Paradise, NP Lyda Jester, [place] Ginnie Smart Audry Riles, PharmD   Please be sure to bring in all your medications bottles to every appointment.

## 2021-07-26 ENCOUNTER — Ambulatory Visit (INDEPENDENT_AMBULATORY_CARE_PROVIDER_SITE_OTHER): Payer: Medicare Other

## 2021-07-26 DIAGNOSIS — I495 Sick sinus syndrome: Secondary | ICD-10-CM | POA: Diagnosis not present

## 2021-07-27 LAB — CUP PACEART REMOTE DEVICE CHECK
Battery Remaining Longevity: 80 mo
Battery Voltage: 2.98 V
Brady Statistic AP VP Percent: 0.13 %
Brady Statistic AP VS Percent: 99.84 %
Brady Statistic AS VP Percent: 0 %
Brady Statistic AS VS Percent: 0.03 %
Brady Statistic RA Percent Paced: 99.97 %
Brady Statistic RV Percent Paced: 0.13 %
Date Time Interrogation Session: 20220912010653
Implantable Lead Implant Date: 20190107
Implantable Lead Implant Date: 20190107
Implantable Lead Location: 753859
Implantable Lead Location: 753860
Implantable Lead Model: 3830
Implantable Lead Model: 5076
Implantable Pulse Generator Implant Date: 20190107
Lead Channel Impedance Value: 266 Ohm
Lead Channel Impedance Value: 304 Ohm
Lead Channel Impedance Value: 342 Ohm
Lead Channel Impedance Value: 399 Ohm
Lead Channel Pacing Threshold Amplitude: 1.125 V
Lead Channel Pacing Threshold Pulse Width: 0.4 ms
Lead Channel Sensing Intrinsic Amplitude: 1.375 mV
Lead Channel Sensing Intrinsic Amplitude: 1.375 mV
Lead Channel Sensing Intrinsic Amplitude: 1.875 mV
Lead Channel Sensing Intrinsic Amplitude: 1.875 mV
Lead Channel Setting Pacing Amplitude: 2.25 V
Lead Channel Setting Pacing Amplitude: 3 V
Lead Channel Setting Pacing Pulse Width: 1 ms
Lead Channel Setting Sensing Sensitivity: 0.9 mV

## 2021-07-30 NOTE — Progress Notes (Signed)
Remote pacemaker transmission.   

## 2021-08-03 NOTE — Telephone Encounter (Signed)
Patient is scheduled for lab study on 09/01/21. Patient understands the sleep study will be done at [facility] sleep lab. Patient understands she will receive a sleep packet in a week or so. Patient understands to call if he does not receive the sleep packet in a timely manner.   Called patient spoke with her daughter per dpr and was told patient declined testing and called to cancel the study at the sleep lab.

## 2021-09-01 ENCOUNTER — Encounter (HOSPITAL_BASED_OUTPATIENT_CLINIC_OR_DEPARTMENT_OTHER): Payer: Medicare Other | Admitting: Cardiology

## 2021-09-03 ENCOUNTER — Other Ambulatory Visit (HOSPITAL_COMMUNITY): Payer: Self-pay | Admitting: Cardiology

## 2021-10-01 ENCOUNTER — Encounter (HOSPITAL_COMMUNITY): Payer: Medicare Other | Admitting: Cardiology

## 2021-10-12 ENCOUNTER — Other Ambulatory Visit: Payer: Self-pay

## 2021-10-12 ENCOUNTER — Ambulatory Visit (HOSPITAL_COMMUNITY)
Admission: RE | Admit: 2021-10-12 | Discharge: 2021-10-12 | Disposition: A | Discharge status: Home / Self Care | Payer: Medicare Other | Source: Ambulatory Visit | Attending: Cardiology | Admitting: Cardiology

## 2021-10-12 ENCOUNTER — Encounter (HOSPITAL_COMMUNITY): Payer: Self-pay | Admitting: Cardiology

## 2021-10-12 VITALS — BP 158/100 | HR 73 | Wt 186.8 lb

## 2021-10-12 DIAGNOSIS — Z7901 Long term (current) use of anticoagulants: Secondary | ICD-10-CM | POA: Diagnosis not present

## 2021-10-12 DIAGNOSIS — R001 Bradycardia, unspecified: Secondary | ICD-10-CM | POA: Diagnosis not present

## 2021-10-12 DIAGNOSIS — I495 Sick sinus syndrome: Secondary | ICD-10-CM | POA: Diagnosis not present

## 2021-10-12 DIAGNOSIS — E785 Hyperlipidemia, unspecified: Secondary | ICD-10-CM | POA: Insufficient documentation

## 2021-10-12 DIAGNOSIS — Z8673 Personal history of transient ischemic attack (TIA), and cerebral infarction without residual deficits: Secondary | ICD-10-CM | POA: Insufficient documentation

## 2021-10-12 DIAGNOSIS — I130 Hypertensive heart and chronic kidney disease with heart failure and stage 1 through stage 4 chronic kidney disease, or unspecified chronic kidney disease: Secondary | ICD-10-CM | POA: Diagnosis not present

## 2021-10-12 DIAGNOSIS — I5032 Chronic diastolic (congestive) heart failure: Secondary | ICD-10-CM

## 2021-10-12 DIAGNOSIS — K219 Gastro-esophageal reflux disease without esophagitis: Secondary | ICD-10-CM | POA: Insufficient documentation

## 2021-10-12 DIAGNOSIS — N183 Chronic kidney disease, stage 3 unspecified: Secondary | ICD-10-CM | POA: Insufficient documentation

## 2021-10-12 DIAGNOSIS — I5042 Chronic combined systolic (congestive) and diastolic (congestive) heart failure: Secondary | ICD-10-CM | POA: Diagnosis not present

## 2021-10-12 DIAGNOSIS — I5022 Chronic systolic (congestive) heart failure: Secondary | ICD-10-CM | POA: Diagnosis not present

## 2021-10-12 DIAGNOSIS — I2510 Atherosclerotic heart disease of native coronary artery without angina pectoris: Secondary | ICD-10-CM | POA: Insufficient documentation

## 2021-10-12 DIAGNOSIS — Z45010 Encounter for checking and testing of cardiac pacemaker pulse generator [battery]: Secondary | ICD-10-CM | POA: Insufficient documentation

## 2021-10-12 DIAGNOSIS — I480 Paroxysmal atrial fibrillation: Secondary | ICD-10-CM | POA: Diagnosis not present

## 2021-10-12 DIAGNOSIS — E1122 Type 2 diabetes mellitus with diabetic chronic kidney disease: Secondary | ICD-10-CM | POA: Insufficient documentation

## 2021-10-12 DIAGNOSIS — R0789 Other chest pain: Secondary | ICD-10-CM | POA: Diagnosis not present

## 2021-10-12 LAB — TSH: TSH: 3.62 u[IU]/mL (ref 0.350–4.500)

## 2021-10-12 LAB — BRAIN NATRIURETIC PEPTIDE: B Natriuretic Peptide: 412.4 pg/mL — ABNORMAL HIGH (ref 0.0–100.0)

## 2021-10-12 LAB — COMPREHENSIVE METABOLIC PANEL
ALT: 14 U/L (ref 0–44)
AST: 16 U/L (ref 15–41)
Albumin: 3.4 g/dL — ABNORMAL LOW (ref 3.5–5.0)
Alkaline Phosphatase: 111 U/L (ref 38–126)
Anion gap: 6 (ref 5–15)
BUN: 28 mg/dL — ABNORMAL HIGH (ref 8–23)
CO2: 26 mmol/L (ref 22–32)
Calcium: 9.5 mg/dL (ref 8.9–10.3)
Chloride: 109 mmol/L (ref 98–111)
Creatinine, Ser: 1.7 mg/dL — ABNORMAL HIGH (ref 0.44–1.00)
GFR, Estimated: 29 mL/min — ABNORMAL LOW (ref >60)
Glucose, Bld: 117 mg/dL — ABNORMAL HIGH (ref 70–99)
Potassium: 4.9 mmol/L (ref 3.5–5.1)
Sodium: 141 mmol/L (ref 135–145)
Total Bilirubin: 0.8 mg/dL (ref 0.3–1.2)
Total Protein: 6.6 g/dL (ref 6.5–8.1)

## 2021-10-12 LAB — CBC
HCT: 39.3 % (ref 36.0–46.0)
Hemoglobin: 12.2 g/dL (ref 12.0–15.0)
MCH: 30.5 pg (ref 26.0–34.0)
MCHC: 31 g/dL (ref 30.0–36.0)
MCV: 98.3 fL (ref 80.0–100.0)
Platelets: 211 10*3/uL (ref 150–400)
RBC: 4 MIL/uL (ref 3.87–5.11)
RDW: 13.9 % (ref 11.5–15.5)
WBC: 7.3 10*3/uL (ref 4.0–10.5)
nRBC: 0 % (ref 0.0–0.2)

## 2021-10-12 NOTE — Progress Notes (Signed)
Advanced Heart Failure Clinic Note   Primary Care: [facility] Prude, [place] HF Cardiology: Dr. Aundra Dubin  HPI:  Shannon Lucas is a 87 y.o. female with PMH of Systolic CHF,  PAF on Xarelto, CKD stage III, DM II, GERD, HLD, CVA, anemia.   Admitted 4/17 -> 6/33/35 with A/C systolic CHF. L/RHC as below with normal coronaries and elevated filling pressures. Diuresed 5 lbs. Hospital course complicated by hematoma at radial cath site that improved overnight. Medications adjusted as tolerated. Discharge weight 205 lbs.    Pt seen in ED 03/08/17 for worsening of hematoma as above.  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 Dr. Donnetta Hutching saw in consult and repaired under local anaesthesia and sedation.   Echo in 10/18 showed EF 35-40% with diffuse hypokinesis, PASP 50 mmHg.  Holter in 10/18 showed runs of atrial fibrillation including afib with aberrancy.  Bradycardia into the 30s was also noted.  Decision was made to place PPM => patient got Medtronic device with His bundle lead and is now His pacing.    Echo in 2/20 showed EF up to 60-65%, mild LVH, normal RV.   She was admitted in 4/21 with fall, rib fractures, sepsis due to UTI, and CHF exacerbation. She was in and out of atrial fibrillation in the hospital.  She was diuresed.   Echo in 6/21 with EF 60-65%, normal RV, mild AS.   She was admitted in 3/22 with delirium and hallucinations, ?related to sinusitis.   Echo in 7/22 showed EF 55-60%, mild LVH, normal RV, mild AS.    She presents today for followup of CHF.  She has occasional atypical left-sided chest pain ("light pain", lasts a couple of minutes, not related to exertion). She walks with a walker.  She is fatigued walking in stores but denies chest pain.  She does housework, notes dyspnea when she makes up her bed.  No palpitations.  She is in NSR today. Weight is up 1 lb.   Medtronic PPM interrogation: 4.1% V-pacing, 97% A-pacing, no AF noted.   ECG (personally reviewed): a-paced, LVH, narrow QRS  Labs  (04/06/17): K 5.0, Creatinine 2.04, BUN 43, LFTs normal Labs (6/18): K 3.9, creatinine 1.68 Labs (8/18): LDL 83, HDL 51, hgb 11.1, K 4.5, creatinine 1.48, LFTs normal, TSH normal.  Labs (1/19): K 4, creatinine 1.5, hgb 12.2, TSH normal, LFTs normal Labs (7/19): K 4.3, creatinine 1.78, LDL 105, TSH normal, LFTs normal Labs (1/20): K 3.9, creatinine 1.95 Labs (4/21): K 3.9, creatinine 1.86 Labs (5/21): LDL 64 Labs (6/21): K 4.1, creatinine 1.8, hgb 9.8 Labs (8/21): K 3.5, creatinine 1.75 Labs (11/21): LDL 66 Labs (3/22): K 4, creatinine 1.7 Labs (8/22): hgb 11.8, K 4.8, creatinine 1.79, LFTs normal, TSH normal  Review of systems complete and found to be negative unless listed in HPI.    PMH 1. Chronic systolic CHF: Nonischemic cardiomyopathy.  - Echo 03/01/17 LVEF 25-30%, At least mild AS, Mild MR, Mod LAE, Mod RAE, Trivial PI, PA peak pressure 42 mm Hg. - LHC (4/18) with nonobstructive CAD.  - Echo (10/18): EF 35-40%, diffuse hypokinesis, mild LVH, mild MR, mild AS, PASP 50 mmHg.  - Medtronic PPM with His bundle lead placed.  - Echo (2/20): EF 60-65%, mild LVH, normal RV size and systolic function.  - Echo (6/21): EF 60-65%, mild LVH, RV normal, PASP 47 mmHg, mild AS mean gradient 14 mmHg.  - Echo (7/22): EF 55-60%, mild LVH, normal RV, mild AS. 2. Atrial fibrillation: Paroxysmal. On Xarelto and amiodarone.  -  Atypical atrial flutter noted 05/28/18.  3. CKD Stage III 4. DMII 5. GERD 6. HLD 7. Hx of CVA - ?2011-2012 with no lasting deficit 8. Chronic anemia 9. CAD: LHC (4/18) with 70% ostial D1, 50% ostial D2, 40% ostial OM1.  10. Aortic stenosis: Mild on echo in 6/21.  Mild on 7/22 echo.  11. 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   Current Outpatient Medications  Medication Sig Dispense Refill   acetaminophen (TYLENOL) 500 MG tablet Take 1,000 mg by mouth every 6 (six) hours as needed for moderate pain or headache.     amiodarone (PACERONE) 200 MG tablet TAKE 1 TABLET  BY MOUTH DAIY 30 tablet 3   benzonatate (TESSALON) 100 MG capsule Take 100 mg by mouth 3 (three) times daily as needed for cough.     ergocalciferol (VITAMIN D2) 50000 units capsule Take 50,000 Units by mouth every Friday. At night     ferrous sulfate 325 (65 FE) MG tablet Take 1 tablet (325 mg total) by mouth 2 (two) times daily with a meal. 90 tablet 6   furosemide (LASIX) 20 MG tablet Take 40mg  (2 tablets) by mouth daily alternating with 20mg  (1 tablet) by mouth daily. 45 tablet 11   hydrALAZINE (APRESOLINE) 50 MG tablet TAKE 1&1/2 TABLETS BY MOUTH 3 TIMES DAILY 135 tablet 3   insulin glargine (LANTUS) 100 UNIT/ML injection Inject 50 Units into the skin daily.     ipratropium (ATROVENT HFA) 17 MCG/ACT inhaler Inhale 2 puffs into the lungs every 6 (six) hours as needed for wheezing. 1 Inhaler 12   isosorbide mononitrate (IMDUR) 30 MG 24 hr tablet TAKE 1 TABLET BY MOUTH DAILY 90 tablet 3   levothyroxine (SYNTHROID) 50 MCG tablet Take 50 mcg by mouth daily.     metoprolol succinate (TOPROL-XL) 100 MG 24 hr tablet TAKE 1 TABLET BY MOUTH DAILY IMMEDIATELYFOLLOWING A MEAL 90 tablet 3   polyethylene glycol powder (MIRALAX) 17 GM/SCOOP powder Take 17 g by mouth 2 (two) times daily as needed for moderate constipation. 255 g 0   potassium chloride SA (KLOR-CON) 20 MEQ tablet Take 1 tablet (20 mEq total) by mouth daily. 30 tablet 6   pravastatin (PRAVACHOL) 40 MG tablet TAKE 1 TABLET BY MOUTH AT BEDTIME 90 tablet 3   senna-docusate (SENOKOT-S) 8.6-50 MG tablet Take 1 tablet by mouth 2 (two) times daily as needed for moderate constipation.     venlafaxine (EFFEXOR) 75 MG tablet Take 75 mg by mouth in the morning and at bedtime.     XARELTO 15 MG TABS tablet Take 1 tablet (15 mg total) by mouth daily with supper. 30 tablet 11   Melatonin 1 MG CAPS Take by mouth daily as needed. (Patient not taking: Reported on 10/12/2021)     No current facility-administered medications for this encounter.   No Known  Allergies   Social History   Socioeconomic History   Marital status: Widowed    Spouse name: Not on file   Number of children: Not on file   Years of education: Not on file   Highest education level: Not on file  Occupational History   Not on file  Tobacco Use   Smoking status: Former   Smokeless tobacco: Never   Tobacco comments:    02/28/2017 "only smoked in the 1960s when we went out"  Vaping Use   Vaping Use: Never used  Substance and Sexual Activity   Alcohol use: No   Drug use: No   Sexual  activity: Never  Other Topics Concern   Not on file  Social History Narrative   Not on file   Social Determinants of Health   Financial Resource Strain: Not on file  Food Insecurity: Not on file  Transportation Needs: Not on file  Physical Activity: Not on file  Stress: Not on file  Social Connections: Not on file  Intimate Partner Violence: Not on file   Family history No family history of premature CAD or CHF.   Vitals:   10/12/21 0937  BP: (!) 158/100  Pulse: 73  SpO2: 96%  Weight: 84.7 kg (186 lb 12.8 oz)     Wt Readings from Last 3 Encounters:  10/12/21 84.7 kg (186 lb 12.8 oz)  07/13/21 84 kg (185 lb 4 oz)  06/01/21 86 kg (189 lb 9.6 oz)    PHYSICAL EXAM: General: NAD Neck: No JVD, no thyromegaly or thyroid nodule.  Lungs: Clear to auscultation bilaterally with normal respiratory effort. CV: Nondisplaced PMI.  Heart regular S1/S2, no S3/S4, 2/6 SEM RUSB with clear S2.  No peripheral edema.  No carotid bruit.  Normal pedal pulses.  Abdomen: Soft, nontender, no hepatosplenomegaly, no distention.  Skin: Intact without lesions or rashes.  Neurologic: Alert and oriented x 3.  Psych: Normal affect. Extremities: No clubbing or cyanosis.  HEENT: Normal.   ASSESSMENT & PLAN:  1. Chronic systolic=>diastolic HF:  Echo with EF 25-30% 02/2017, repeat echo 10/18 showed EF 35-40%. Nonischemic cardiomyopathy. Medtronic device with His bundle lead was placed.  However,  she has a narrow QRS and rarely His paces.  Most recent echo in 7/22 showed EF up to 55-60% with mild LVH and normal RV.  NYHA III currently. Weight is stable and she is not volume overloaded.  - Continue Lasix 40 daily alternating with 20 daily.  BMET/BNP today.  - She was unable to tolerate Jardiance.    - Continue Toprol XL 100 mg daily.  - Continue hydralazine/Imdur.  2. CKD: Stage III - BMET today.  3. PAF/atrial flutter: No atrial fibrillation on device interrogation.  - Continue amiodarone.  Check TSH and LFTs. She will need regular eye exams.   - Continue Xarelto, CBC today.  4. CAD: Nonobstructive on 4/18 cath.     - Continue pravastatin.   - No ASA given stable CAD and Xarelto use.  5. Bradycardia: She is s/p Medtronic PPM with His bundle pacing, primarily a-pacing with rare His bundle pacing given narrow QRS.  6. HTN: BP is high today but she has not taken her medications yet. BP is generally controlled after her meds.   Followup in 4 months with APP  Loralie Champagne 10/12/2021

## 2021-10-12 NOTE — Patient Instructions (Signed)
EKG done today.  Labs done today. We will contact you only if your labs are abnormal.  No medication changes were made. Please continue all current medications as prescribed.  Your physician recommends that you schedule a follow-up appointment in: 4 months with our NP/PA Clinic here in our office.   If you have any questions or concerns before your next appointment please send us a message through mychart or call our office at [phone].    TO LEAVE A MESSAGE FOR THE NURSE SELECT OPTION 2, PLEASE LEAVE A MESSAGE INCLUDING: YOUR NAME DATE OF BIRTH CALL BACK NUMBER REASON FOR CALL**this is important as we prioritize the call backs  YOU WILL RECEIVE A CALL BACK THE SAME DAY AS LONG AS YOU CALL BEFORE 4:00 PM   Do the following things EVERYDAY: Weigh yourself in the morning before breakfast. Write it down and keep it in a log. Take your medicines as prescribed Eat low salt foods--Limit salt (sodium) to 2000 mg per day.  Stay as active as you can everyday Limit all fluids for the day to less than 2 liters   At the [facility], you and your health needs are our priority. As part of our continuing mission to provide you with exceptional heart care, we have created designated Provider Care Teams. These Care Teams include your primary Cardiologist (physician) and Advanced Practice Providers (APPs- Physician Assistants and Nurse Practitioners) who all work together to provide you with the care you need, when you need it.   You may see any of the following providers on your designated Care Team at your next follow up: Dr Daniel Bensimhon Dr Dalton McLean Amy Clegg, NP Brittainy Simmons, PA Lauren Kemp, PharmD   Please be sure to bring in all your medications bottles to every appointment.   

## 2021-10-25 ENCOUNTER — Ambulatory Visit (INDEPENDENT_AMBULATORY_CARE_PROVIDER_SITE_OTHER): Payer: Medicare Other

## 2021-10-25 DIAGNOSIS — I495 Sick sinus syndrome: Secondary | ICD-10-CM | POA: Diagnosis not present

## 2021-10-26 LAB — CUP PACEART REMOTE DEVICE CHECK
Battery Remaining Longevity: 87 mo
Battery Voltage: 2.99 V
Brady Statistic AP VP Percent: 0.09 %
Brady Statistic AP VS Percent: 99.8 %
Brady Statistic AS VP Percent: 0 %
Brady Statistic AS VS Percent: 0.11 %
Brady Statistic RA Percent Paced: 99.91 %
Brady Statistic RV Percent Paced: 0.09 %
Date Time Interrogation Session: 20221213000636
Implantable Lead Implant Date: 20190107
Implantable Lead Implant Date: 20190107
Implantable Lead Location: 753859
Implantable Lead Location: 753860
Implantable Lead Model: 3830
Implantable Lead Model: 5076
Implantable Pulse Generator Implant Date: 20190107
Lead Channel Impedance Value: 285 Ohm
Lead Channel Impedance Value: 342 Ohm
Lead Channel Impedance Value: 361 Ohm
Lead Channel Impedance Value: 418 Ohm
Lead Channel Pacing Threshold Amplitude: 1 V
Lead Channel Pacing Threshold Pulse Width: 0.4 ms
Lead Channel Sensing Intrinsic Amplitude: 1.25 mV
Lead Channel Sensing Intrinsic Amplitude: 1.25 mV
Lead Channel Sensing Intrinsic Amplitude: 3 mV
Lead Channel Sensing Intrinsic Amplitude: 3 mV
Lead Channel Setting Pacing Amplitude: 2 V
Lead Channel Setting Pacing Amplitude: 3 V
Lead Channel Setting Pacing Pulse Width: 1 ms
Lead Channel Setting Sensing Sensitivity: 0.9 mV

## 2021-11-01 NOTE — Progress Notes (Signed)
Remote pacemaker transmission.   

## 2021-11-30 ENCOUNTER — Other Ambulatory Visit: Payer: Self-pay | Admitting: Cardiology

## 2021-12-17 ENCOUNTER — Other Ambulatory Visit (HOSPITAL_COMMUNITY): Payer: Self-pay | Admitting: Cardiology

## 2022-01-24 ENCOUNTER — Ambulatory Visit (INDEPENDENT_AMBULATORY_CARE_PROVIDER_SITE_OTHER): Payer: Medicare Other

## 2022-01-24 DIAGNOSIS — I495 Sick sinus syndrome: Secondary | ICD-10-CM

## 2022-01-25 LAB — CUP PACEART REMOTE DEVICE CHECK
Battery Remaining Longevity: 84 mo
Battery Voltage: 2.98 V
Brady Statistic AP VP Percent: 7.08 %
Brady Statistic AP VS Percent: 90.63 %
Brady Statistic AS VP Percent: 1.38 %
Brady Statistic AS VS Percent: 0.91 %
Brady Statistic RA Percent Paced: 98.15 %
Brady Statistic RV Percent Paced: 8.49 %
Date Time Interrogation Session: 20230314010817
Implantable Lead Implant Date: 20190107
Implantable Lead Implant Date: 20190107
Implantable Lead Location: 753859
Implantable Lead Location: 753860
Implantable Lead Model: 3830
Implantable Lead Model: 5076
Implantable Pulse Generator Implant Date: 20190107
Lead Channel Impedance Value: 266 Ohm
Lead Channel Impedance Value: 342 Ohm
Lead Channel Impedance Value: 361 Ohm
Lead Channel Impedance Value: 418 Ohm
Lead Channel Pacing Threshold Amplitude: 1 V
Lead Channel Pacing Threshold Pulse Width: 0.4 ms
Lead Channel Sensing Intrinsic Amplitude: 1.375 mV
Lead Channel Sensing Intrinsic Amplitude: 1.375 mV
Lead Channel Sensing Intrinsic Amplitude: 2.125 mV
Lead Channel Sensing Intrinsic Amplitude: 2.125 mV
Lead Channel Setting Pacing Amplitude: 2 V
Lead Channel Setting Pacing Amplitude: 3 V
Lead Channel Setting Pacing Pulse Width: 1 ms
Lead Channel Setting Sensing Sensitivity: 0.9 mV

## 2022-02-03 NOTE — Progress Notes (Signed)
Remote pacemaker transmission.   

## 2022-02-07 NOTE — Progress Notes (Signed)
?Advanced Heart Failure Clinic Note  ? ?Primary Care: [facility], [place] ?HF Cardiology: Dr. Aundra Dubin ? ?HPI: ?Shannon Lucas is a 88 y.o. female with PMH of Systolic CHF, PAF on Xarelto, CKD stage III, DM II, GERD, HLD, CVA, anemia.  ? ?Admitted 4/17 -> 9/67/59 with A/C systolic CHF. L/RHC as below with normal coronaries and elevated filling pressures. Diuresed 5 lbs. Hospital course complicated by hematoma at radial cath site that improved overnight. Medications adjusted as tolerated. Discharge weight 205 lbs.   ? ?Pt seen in ED 03/08/17 for worsening of hematoma as above.  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 Dr. Donnetta Hutching saw in consult and repaired under local anaesthesia and sedation.  ? ?Echo in 10/18 showed EF 35-40% with diffuse hypokinesis, PASP 50 mmHg.  Holter in 10/18 showed runs of atrial fibrillation including afib with aberrancy.  Bradycardia into the 30s was also noted.  Decision was made to place PPM => patient got Medtronic device with His bundle lead and is now His pacing.   ? ?Echo in 2/20 showed EF up to 60-65%, mild LVH, normal RV.  ? ?She was admitted in 4/21 with fall, rib fractures, sepsis due to UTI, and CHF exacerbation. She was in and out of atrial fibrillation in the hospital.  She was diuresed.  ? ?Echo in 6/21 with EF 60-65%, normal RV, mild AS.  ? ?She was admitted in 3/22 with delirium and hallucinations, ?related to sinusitis.  ? ?Echo in 7/22 showed EF 55-60%, mild LVH, normal RV, mild AS.   ? ?Today she returns for HF follow up with her daugther. Overall feeling fine. She has mild dyspnea walking with her walker for longer distances. Denies abnormal bleeding, palpitations, CP, dizziness, edema, or PND/Orthopnea. Appetite ok. No fever or chills. Taking all medications. Per daughter, having hallucinations. PCP ordered CT and working up for UTI. No recent falls. Daughter lives close by. ? ?Medtronic PPM interrogation (personally reviewed): 2.9% V-pacing, 99.3% A-pacing, no AF noted, 0.7 hr/daily  activity. ? ?ECG (personally reviewed): a-paced, narrow QRS ? ?Labs (04/06/17): K 5.0, Creatinine 2.04, BUN 43, LFTs normal ?Labs (6/18): K 3.9, creatinine 1.68 ?Labs (8/18): LDL 83, HDL 51, hgb 11.1, K 4.5, creatinine 1.48, LFTs normal, TSH normal.  ?Labs (1/19): K 4, creatinine 1.5, hgb 12.2, TSH normal, LFTs normal ?Labs (7/19): K 4.3, creatinine 1.78, LDL 105, TSH normal, LFTs normal ?Labs (1/20): K 3.9, creatinine 1.95 ?Labs (4/21): K 3.9, creatinine 1.86 ?Labs (5/21): LDL 64 ?Labs (6/21): K 4.1, creatinine 1.8, hgb 9.8 ?Labs (8/21): K 3.5, creatinine 1.75 ?Labs (11/21): LDL 66 ?Labs (3/22): K 4, creatinine 1.7 ?Labs (8/22): hgb 11.8, K 4.8, creatinine 1.79, LFTs normal, TSH normal ?Labs (3/22): K 4.7, creatinine 1.50, hgb 12.5, LFTs normal ? ?Review of systems complete and found to be negative unless listed in HPI.   ? ?PMH ?1. Chronic systolic CHF: Nonischemic cardiomyopathy.  ?- Echo 03/01/17 LVEF 25-30%, At least mild AS, Mild MR, Mod LAE, Mod RAE, Trivial PI, PA peak pressure 42 mm Hg. ?- LHC (4/18) with nonobstructive CAD.  ?- Echo (10/18): EF 35-40%, diffuse hypokinesis, mild LVH, mild MR, mild AS, PASP 50 mmHg.  ?- Medtronic PPM with His bundle lead placed.  ?- Echo (2/20): EF 60-65%, mild LVH, normal RV size and systolic function.  ?- Echo (6/21): EF 60-65%, mild LVH, RV normal, PASP 47 mmHg, mild AS mean gradient 14 mmHg.  ?- Echo (7/22): EF 55-60%, mild LVH, normal RV, mild AS. ?2. Atrial fibrillation: Paroxysmal. On  Xarelto and amiodarone.  ?- Atypical atrial flutter noted 05/28/18.  ?3. CKD Stage III ?4. DMII ?5. GERD ?6. HLD ?7. Hx of CVA ?- ?2011-2012 with no lasting deficit ?8. Chronic anemia ?9. CAD: LHC (4/18) with 70% ostial D1, 50% ostial D2, 40% ostial OM1.  ?10. Aortic stenosis: Mild on echo in 6/21.  Mild on 7/22 echo.  ?11. 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  ? ?Current Outpatient Medications  ?Medication Sig Dispense Refill  ? acetaminophen (TYLENOL) 500 MG tablet Take 1,000  mg by mouth every 6 (six) hours as needed for moderate pain or headache.    ? amiodarone (PACERONE) 200 MG tablet TAKE 1 TABLET BY MOUTH DAIY 30 tablet 3  ? benzonatate (TESSALON) 100 MG capsule Take 100 mg by mouth 3 (three) times daily as needed for cough.    ? cephALEXin (KEFLEX) 250 MG capsule Take 250 mg by mouth 2 (two) times daily.    ? ergocalciferol (VITAMIN D2) 50000 units capsule Take 50,000 Units by mouth every Friday. At night    ? ferrous sulfate 325 (65 FE) MG tablet Take 1 tablet (325 mg total) by mouth 2 (two) times daily with a meal. 90 tablet 6  ? furosemide (LASIX) 20 MG tablet Take '40mg'$  (2 tablets) by mouth daily alternating with '20mg'$  (1 tablet) by mouth daily. 45 tablet 11  ? hydrALAZINE (APRESOLINE) 50 MG tablet TAKE 1&1/2 TABLETS BY MOUTH 3 TIMES DAILY 135 tablet 3  ? insulin glargine (LANTUS) 100 UNIT/ML injection Inject 50 Units into the skin daily.    ? ipratropium (ATROVENT HFA) 17 MCG/ACT inhaler Inhale 2 puffs into the lungs every 6 (six) hours as needed for wheezing. 1 Inhaler 12  ? isosorbide mononitrate (IMDUR) 30 MG 24 hr tablet TAKE 1 TABLET BY MOUTH DAILY 90 tablet 3  ? levothyroxine (SYNTHROID) 50 MCG tablet Take 50 mcg by mouth daily.    ? Melatonin 1 MG CAPS Take by mouth daily as needed.    ? metoprolol succinate (TOPROL-XL) 100 MG 24 hr tablet TAKE 1 TABLET BY MOUTH DAILY IMMEDIATELYFOLLOWING A MEAL 90 tablet 3  ? omeprazole (PRILOSEC) 20 MG capsule Take 20 mg by mouth daily.    ? polyethylene glycol powder (MIRALAX) 17 GM/SCOOP powder Take 17 g by mouth 2 (two) times daily as needed for moderate constipation. 255 g 0  ? potassium chloride SA (KLOR-CON M) 20 MEQ tablet TAKE 1 TABLET BY MOUTH DAILY 30 tablet 6  ? pravastatin (PRAVACHOL) 40 MG tablet TAKE 1 TABLET BY MOUTH AT BEDTIME 90 tablet 3  ? senna-docusate (SENOKOT-S) 8.6-50 MG tablet Take 1 tablet by mouth 2 (two) times daily as needed for moderate constipation.    ? venlafaxine (EFFEXOR) 75 MG tablet Take 75 mg by  mouth in the morning and at bedtime.    ? XARELTO 15 MG TABS tablet Take 1 tablet (15 mg total) by mouth daily with supper. 30 tablet 11  ? ?No current facility-administered medications for this encounter.  ? ?No Known Allergies ?  ?Social History  ? ?Socioeconomic History  ? Marital status: Widowed  ?  Spouse name: Not on file  ? Number of children: Not on file  ? Years of education: Not on file  ? Highest education level: Not on file  ?Occupational History  ? Not on file  ?Tobacco Use  ? Smoking status: Former  ? Smokeless tobacco: Never  ? Tobacco comments:  ?  02/28/2017 "only smoked in the 1960s when  we went out"  ?Vaping Use  ? Vaping Use: Never used  ?Substance and Sexual Activity  ? Alcohol use: No  ? Drug use: No  ? Sexual activity: Never  ?Other Topics Concern  ? Not on file  ?Social History Narrative  ? Not on file  ? ?Social Determinants of Health  ? ?Financial Resource Strain: Not on file  ?Food Insecurity: Not on file  ?Transportation Needs: Not on file  ?Physical Activity: Not on file  ?Stress: Not on file  ?Social Connections: Not on file  ?Intimate Partner Violence: Not on file  ? ?Family history ?No family history of premature CAD or CHF.  ? ?BP 132/62   Pulse 70   Wt 85.3 kg (188 lb)   SpO2 96%   BMI 31.28 kg/m?  ? ?Wt Readings from Last 3 Encounters:  ?02/08/22 85.3 kg (188 lb)  ?10/12/21 84.7 kg (186 lb 12.8 oz)  ?07/13/21 84 kg (185 lb 4 oz)  ?  ?PHYSICAL EXAM: ?General:  NAD. No resp difficulty, walked into the clinic with rolling walker. ?HEENT: Normal ?Neck: Supple. No JVD. Carotids 2+ bilat; no bruits. No lymphadenopathy or thryomegaly appreciated. ?Cor: PMI nondisplaced. Regular rate & rhythm. No rubs, gallops, 2/6 SEM RUSB Clear S2 ?Lungs: Clear ?Abdomen: Soft, nontender, nondistended. No hepatosplenomegaly. No bruits or masses. Good bowel sounds. ?Extremities: No cyanosis, clubbing, rash, edema ?Neuro: Alert & oriented x 3, cranial nerves grossly intact. Moves all 4 extremities w/o  difficulty. Affect pleasant. ? ?ASSESSMENT & PLAN: ?1. Chronic systolic=>diastolic HF:  Echo with EF 25-30% 02/2017, repeat echo 10/18 showed EF 35-40%. Nonischemic cardiomyopathy. Medtronic device with His bun

## 2022-02-08 ENCOUNTER — Other Ambulatory Visit: Payer: Self-pay

## 2022-02-08 ENCOUNTER — Ambulatory Visit (HOSPITAL_COMMUNITY)
Admission: RE | Admit: 2022-02-08 | Discharge: 2022-02-08 | Disposition: A | Discharge status: Home / Self Care | Payer: Medicare Other | Source: Ambulatory Visit | Attending: Family Medicine | Admitting: Family Medicine

## 2022-02-08 ENCOUNTER — Encounter (HOSPITAL_COMMUNITY): Payer: Self-pay

## 2022-02-08 VITALS — BP 132/62 | HR 70 | Wt 188.0 lb

## 2022-02-08 DIAGNOSIS — I10 Essential (primary) hypertension: Secondary | ICD-10-CM

## 2022-02-08 DIAGNOSIS — I495 Sick sinus syndrome: Secondary | ICD-10-CM

## 2022-02-08 DIAGNOSIS — Z8673 Personal history of transient ischemic attack (TIA), and cerebral infarction without residual deficits: Secondary | ICD-10-CM | POA: Diagnosis not present

## 2022-02-08 DIAGNOSIS — K219 Gastro-esophageal reflux disease without esophagitis: Secondary | ICD-10-CM | POA: Insufficient documentation

## 2022-02-08 DIAGNOSIS — E1122 Type 2 diabetes mellitus with diabetic chronic kidney disease: Secondary | ICD-10-CM | POA: Insufficient documentation

## 2022-02-08 DIAGNOSIS — N183 Chronic kidney disease, stage 3 unspecified: Secondary | ICD-10-CM | POA: Insufficient documentation

## 2022-02-08 DIAGNOSIS — I428 Other cardiomyopathies: Secondary | ICD-10-CM | POA: Diagnosis not present

## 2022-02-08 DIAGNOSIS — Z7901 Long term (current) use of anticoagulants: Secondary | ICD-10-CM | POA: Insufficient documentation

## 2022-02-08 DIAGNOSIS — I4892 Unspecified atrial flutter: Secondary | ICD-10-CM | POA: Insufficient documentation

## 2022-02-08 DIAGNOSIS — E785 Hyperlipidemia, unspecified: Secondary | ICD-10-CM | POA: Insufficient documentation

## 2022-02-08 DIAGNOSIS — I480 Paroxysmal atrial fibrillation: Secondary | ICD-10-CM | POA: Insufficient documentation

## 2022-02-08 DIAGNOSIS — I2510 Atherosclerotic heart disease of native coronary artery without angina pectoris: Secondary | ICD-10-CM | POA: Diagnosis not present

## 2022-02-08 DIAGNOSIS — I5042 Chronic combined systolic (congestive) and diastolic (congestive) heart failure: Secondary | ICD-10-CM | POA: Diagnosis present

## 2022-02-08 DIAGNOSIS — I5032 Chronic diastolic (congestive) heart failure: Secondary | ICD-10-CM | POA: Diagnosis not present

## 2022-02-08 DIAGNOSIS — I130 Hypertensive heart and chronic kidney disease with heart failure and stage 1 through stage 4 chronic kidney disease, or unspecified chronic kidney disease: Secondary | ICD-10-CM | POA: Diagnosis not present

## 2022-02-08 DIAGNOSIS — Z79899 Other long term (current) drug therapy: Secondary | ICD-10-CM | POA: Diagnosis not present

## 2022-02-08 NOTE — Patient Instructions (Signed)
It was great to see you today! ?No medication changes are needed at this time. ? ? ?Your physician recommends that you schedule a follow-up appointment in: 4 months with Dr Aundra Dubin ?-please give our office a call the schedule an appointment in June 2023 ? ? ?Do the following things EVERYDAY: ?Weigh yourself in the morning before breakfast. Write it down and keep it in a log. ?Take your medicines as prescribed ?Eat low salt foods--Limit salt (sodium) to 2000 mg per day.  ?Stay as active as you can everyday ?Limit all fluids for the day to less than 2 liters ? ?At the [facility], you and your health needs are our priority. As part of our continuing mission to provide you with exceptional heart care, we have created designated Provider Care Teams. These Care Teams include your primary Cardiologist (physician) and Advanced Practice Providers (APPs- Physician Assistants and Nurse Practitioners) who all work together to provide you with the care you need, when you need it.  ? ?You may see any of the following providers on your designated Care Team at your next follow up: ?Dr Glori Bickers ?Dr Loralie Champagne ?Darrick Grinder, NP ?Lyda Jester, PA ?Jessica Milford,NP ?Marlyce Huge, PA ?Audry Riles, PharmD ? ? ?Please be sure to bring in all your medications bottles to every appointment.  ? ?If you have any questions or concerns before your next appointment please send [place] a message through [place] or call our office at [phone].   ? ?TO LEAVE A MESSAGE FOR THE NURSE SELECT OPTION 2, PLEASE LEAVE A MESSAGE INCLUDING: ?YOUR NAME ?DATE OF BIRTH ?CALL BACK NUMBER ?REASON FOR CALL**this is important as we prioritize the call backs ? ?YOU WILL RECEIVE A CALL BACK THE SAME DAY AS LONG AS YOU CALL BEFORE 4:00 PM ? ? ?

## 2022-03-22 ENCOUNTER — Other Ambulatory Visit (HOSPITAL_COMMUNITY): Payer: Self-pay | Admitting: Cardiology

## 2022-04-25 ENCOUNTER — Ambulatory Visit (INDEPENDENT_AMBULATORY_CARE_PROVIDER_SITE_OTHER): Payer: Medicare Other

## 2022-04-25 DIAGNOSIS — I495 Sick sinus syndrome: Secondary | ICD-10-CM | POA: Diagnosis not present

## 2022-04-26 LAB — CUP PACEART REMOTE DEVICE CHECK
Battery Remaining Longevity: 90 mo
Battery Voltage: 2.99 V
Brady Statistic AP VP Percent: 0.86 %
Brady Statistic AP VS Percent: 91.49 %
Brady Statistic AS VP Percent: 0.03 %
Brady Statistic AS VS Percent: 7.62 %
Brady Statistic RA Percent Paced: 92.39 %
Brady Statistic RV Percent Paced: 0.88 %
Date Time Interrogation Session: 20230612010708
Implantable Lead Implant Date: 20190107
Implantable Lead Implant Date: 20190107
Implantable Lead Location: 753859
Implantable Lead Location: 753860
Implantable Lead Model: 3830
Implantable Lead Model: 5076
Implantable Pulse Generator Implant Date: 20190107
Lead Channel Impedance Value: 266 Ohm
Lead Channel Impedance Value: 323 Ohm
Lead Channel Impedance Value: 361 Ohm
Lead Channel Impedance Value: 399 Ohm
Lead Channel Pacing Threshold Amplitude: 0.75 V
Lead Channel Pacing Threshold Pulse Width: 0.4 ms
Lead Channel Sensing Intrinsic Amplitude: 1.75 mV
Lead Channel Sensing Intrinsic Amplitude: 1.75 mV
Lead Channel Sensing Intrinsic Amplitude: 2.875 mV
Lead Channel Sensing Intrinsic Amplitude: 2.875 mV
Lead Channel Setting Pacing Amplitude: 1.5 V
Lead Channel Setting Pacing Amplitude: 3 V
Lead Channel Setting Pacing Pulse Width: 1 ms
Lead Channel Setting Sensing Sensitivity: 0.9 mV

## 2022-04-29 ENCOUNTER — Other Ambulatory Visit (HOSPITAL_COMMUNITY): Payer: Self-pay

## 2022-04-29 MED ORDER — FUROSEMIDE 20 MG PO TABS: ORAL_TABLET | ORAL | 8 refills | Status: DC

## 2022-05-04 ENCOUNTER — Other Ambulatory Visit (HOSPITAL_COMMUNITY): Payer: Self-pay | Admitting: *Deleted

## 2022-05-04 MED ORDER — ISOSORBIDE MONONITRATE ER 30 MG PO TB24: 30.0000 mg | ORAL_TABLET | Freq: Every day | ORAL | 3 refills | Status: DC

## 2022-05-04 MED ORDER — METOPROLOL SUCCINATE ER 100 MG PO TB24: ORAL_TABLET | ORAL | 3 refills | Status: DC

## 2022-05-19 NOTE — Progress Notes (Signed)
Remote pacemaker transmission.   

## 2022-06-14 ENCOUNTER — Ambulatory Visit (HOSPITAL_COMMUNITY)
Admission: RE | Admit: 2022-06-14 | Discharge: 2022-06-14 | Disposition: A | Discharge status: Home / Self Care | Payer: Medicare Other | Source: Ambulatory Visit | Attending: Cardiology | Admitting: Cardiology

## 2022-06-14 ENCOUNTER — Encounter (HOSPITAL_COMMUNITY): Payer: Self-pay | Admitting: Cardiology

## 2022-06-14 VITALS — BP 140/80 | HR 65 | Wt 189.4 lb

## 2022-06-14 DIAGNOSIS — I4892 Unspecified atrial flutter: Secondary | ICD-10-CM | POA: Insufficient documentation

## 2022-06-14 DIAGNOSIS — Z8673 Personal history of transient ischemic attack (TIA), and cerebral infarction without residual deficits: Secondary | ICD-10-CM | POA: Insufficient documentation

## 2022-06-14 DIAGNOSIS — E785 Hyperlipidemia, unspecified: Secondary | ICD-10-CM | POA: Diagnosis not present

## 2022-06-14 DIAGNOSIS — E1122 Type 2 diabetes mellitus with diabetic chronic kidney disease: Secondary | ICD-10-CM | POA: Insufficient documentation

## 2022-06-14 DIAGNOSIS — I2510 Atherosclerotic heart disease of native coronary artery without angina pectoris: Secondary | ICD-10-CM | POA: Insufficient documentation

## 2022-06-14 DIAGNOSIS — R0789 Other chest pain: Secondary | ICD-10-CM | POA: Diagnosis not present

## 2022-06-14 DIAGNOSIS — I480 Paroxysmal atrial fibrillation: Secondary | ICD-10-CM | POA: Insufficient documentation

## 2022-06-14 DIAGNOSIS — F0152 Vascular dementia, unspecified severity, with psychotic disturbance: Secondary | ICD-10-CM | POA: Insufficient documentation

## 2022-06-14 DIAGNOSIS — I5032 Chronic diastolic (congestive) heart failure: Secondary | ICD-10-CM

## 2022-06-14 DIAGNOSIS — N183 Chronic kidney disease, stage 3 unspecified: Secondary | ICD-10-CM | POA: Diagnosis not present

## 2022-06-14 DIAGNOSIS — I428 Other cardiomyopathies: Secondary | ICD-10-CM | POA: Insufficient documentation

## 2022-06-14 DIAGNOSIS — I5042 Chronic combined systolic (congestive) and diastolic (congestive) heart failure: Secondary | ICD-10-CM | POA: Diagnosis present

## 2022-06-14 DIAGNOSIS — I130 Hypertensive heart and chronic kidney disease with heart failure and stage 1 through stage 4 chronic kidney disease, or unspecified chronic kidney disease: Secondary | ICD-10-CM | POA: Insufficient documentation

## 2022-06-14 DIAGNOSIS — Z79899 Other long term (current) drug therapy: Secondary | ICD-10-CM | POA: Diagnosis not present

## 2022-06-14 DIAGNOSIS — K219 Gastro-esophageal reflux disease without esophagitis: Secondary | ICD-10-CM | POA: Diagnosis not present

## 2022-06-14 DIAGNOSIS — Z7901 Long term (current) use of anticoagulants: Secondary | ICD-10-CM | POA: Insufficient documentation

## 2022-06-14 DIAGNOSIS — Z87440 Personal history of urinary (tract) infections: Secondary | ICD-10-CM | POA: Diagnosis not present

## 2022-06-14 MED ORDER — FUROSEMIDE 20 MG PO TABS: ORAL_TABLET | ORAL | 3 refills | Status: DC

## 2022-06-14 MED ORDER — FUROSEMIDE 20 MG PO TABS: 40.0000 mg | ORAL_TABLET | Freq: Every day | ORAL | 3 refills | Status: DC

## 2022-06-14 NOTE — Progress Notes (Signed)
Advanced Heart Failure Clinic Note   Primary Care: [facility], [place] HF Cardiology: Dr. Aundra Dubin  HPI: Shannon Lucas is a 88 y.o. female with PMH of Systolic CHF, PAF on Xarelto, CKD stage III, DM II, GERD, HLD, CVA, anemia.   Admitted 4/17 -> 1/61/09 with A/C systolic CHF. L/RHC as below with normal coronaries and elevated filling pressures. Diuresed 5 lbs. Hospital course complicated by hematoma at radial cath site that improved overnight. Medications adjusted as tolerated. Discharge weight 205 lbs.    Pt seen in ED 03/08/17 for worsening of hematoma as above.  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 Dr. Donnetta Hutching saw in consult and repaired under local anaesthesia and sedation.   Echo in 10/18 showed EF 35-40% with diffuse hypokinesis, PASP 50 mmHg.  Holter in 10/18 showed runs of atrial fibrillation including afib with aberrancy.  Bradycardia into the 30s was also noted.  Decision was made to place PPM => patient got Medtronic device with His bundle lead and is now His pacing.    Echo in 2/20 showed EF up to 60-65%, mild LVH, normal RV.   She was admitted in 4/21 with fall, rib fractures, sepsis due to UTI, and CHF exacerbation. She was in and out of atrial fibrillation in the hospital.  She was diuresed.   Echo in 6/21 with EF 60-65%, normal RV, mild AS.   She was admitted in 3/22 with delirium and hallucinations, ?related to sinusitis.   Echo in 7/22 showed EF 55-60%, mild LVH, normal RV, mild AS.    Presents today for routine f/u. Here w/ daughter. Saw PCP last week and noted increased LEE. Also eating more since starting Zyprexa. Labs were obtained and BNP was elevated at 474. SCr 1.97, not far from baseline. Diuretic dose increase recommended but pt wanted to wait until this appt before doing so. She continues w/ LEE. Denies any significant change in dyspnea from baseline.   C/w hallucinations. Daughter reports she has officially been diagnosed w/ vascular dementia. Has been referred to  neurology and has initial consultation appt scheduled tomorrow.   C/w frequent UTIs. Just completed a round of abx.   Complaining of atypical rt sided CP along rib cage. Reproducible w/ palpation.    Medtronic PPM interrogation (personally reviewed): 8.6% V-pacing, 98.3% A-pacing, several runs of AT/AF, longest ~2 hrs, 0.8 hr/daily activity.  ECG (personally reviewed): a-paced, narrow QRS, 61 bpm, QT/QTc 484/487 ms    Labs (04/06/17): K 5.0, Creatinine 2.04, BUN 43, LFTs normal Labs (6/18): K 3.9, creatinine 1.68 Labs (8/18): LDL 83, HDL 51, hgb 11.1, K 4.5, creatinine 1.48, LFTs normal, TSH normal.  Labs (1/19): K 4, creatinine 1.5, hgb 12.2, TSH normal, LFTs normal Labs (7/19): K 4.3, creatinine 1.78, LDL 105, TSH normal, LFTs normal Labs (1/20): K 3.9, creatinine 1.95 Labs (4/21): K 3.9, creatinine 1.86 Labs (5/21): LDL 64 Labs (6/21): K 4.1, creatinine 1.8, hgb 9.8 Labs (8/21): K 3.5, creatinine 1.75 Labs (11/21): LDL 66 Labs (3/22): K 4, creatinine 1.7 Labs (8/22): hgb 11.8, K 4.8, creatinine 1.79, LFTs normal, TSH normal Labs (3/22): K 4.7, creatinine 1.50, hgb 12.5, LFTs normal Labs (7/23): K 4.8, creatinine 1.97, LFTs normal, Hgb 11.6, BNP 474, TSH normal   Review of systems complete and found to be negative unless listed in HPI.    PMH 1. Chronic systolic CHF: Nonischemic cardiomyopathy.  - Echo 03/01/17 LVEF 25-30%, At least mild AS, Mild MR, Mod LAE, Mod RAE, Trivial PI, PA peak pressure 42 mm Hg. -  LHC (4/18) with nonobstructive CAD.  - Echo (10/18): EF 35-40%, diffuse hypokinesis, mild LVH, mild MR, mild AS, PASP 50 mmHg.  - Medtronic PPM with His bundle lead placed.  - Echo (2/20): EF 60-65%, mild LVH, normal RV size and systolic function.  - Echo (6/21): EF 60-65%, mild LVH, RV normal, PASP 47 mmHg, mild AS mean gradient 14 mmHg.  - Echo (7/22): EF 55-60%, mild LVH, normal RV, mild AS. 2. Atrial fibrillation: Paroxysmal. On Xarelto and amiodarone.  - Atypical  atrial flutter noted 05/28/18.  3. CKD Stage III 4. DMII 5. GERD 6. HLD 7. Hx of CVA - ?2011-2012 with no lasting deficit 8. Chronic anemia 9. CAD: LHC (4/18) with 70% ostial D1, 50% ostial D2, 40% ostial OM1.  10. Aortic stenosis: Mild on echo in 6/21.  Mild on 7/22 echo.  11. 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   Current Outpatient Medications  Medication Sig Dispense Refill   acetaminophen (TYLENOL) 500 MG tablet Take 1,000 mg by mouth every 6 (six) hours as needed for moderate pain or headache.     amiodarone (PACERONE) 200 MG tablet TAKE 1 TABLET BY MOUTH DAIY 30 tablet 3   benzonatate (TESSALON) 100 MG capsule Take 100 mg by mouth 3 (three) times daily as needed for cough.     cephALEXin (KEFLEX) 250 MG capsule Take 250 mg by mouth 2 (two) times daily.     ergocalciferol (VITAMIN D2) 50000 units capsule Take 50,000 Units by mouth every Friday. At night     ferrous sulfate 325 (65 FE) MG tablet Take 1 tablet (325 mg total) by mouth 2 (two) times daily with a meal. 90 tablet 6   hydrALAZINE (APRESOLINE) 50 MG tablet TAKE 1&1/2 TABLETS BY MOUTH 3 TIMES DAILY 135 tablet 3   insulin glargine (LANTUS) 100 UNIT/ML injection Inject 50 Units into the skin daily.     ipratropium (ATROVENT HFA) 17 MCG/ACT inhaler Inhale 2 puffs into the lungs every 6 (six) hours as needed for wheezing. 1 Inhaler 12   isosorbide mononitrate (IMDUR) 30 MG 24 hr tablet Take 1 tablet (30 mg total) by mouth daily. 90 tablet 3   levothyroxine (SYNTHROID) 50 MCG tablet Take 50 mcg by mouth daily.     Melatonin 1 MG CAPS Take by mouth daily as needed.     metoprolol succinate (TOPROL-XL) 100 MG 24 hr tablet TAKE 1 TABLET BY MOUTH DAILY IMMEDIATELYFOLLOWING A MEAL 90 tablet 3   OLANZapine (ZYPREXA) 2.5 MG tablet Take 2.5 mg by mouth at bedtime.     omeprazole (PRILOSEC) 20 MG capsule Take 20 mg by mouth daily.     polyethylene glycol powder (MIRALAX) 17 GM/SCOOP powder Take 17 g by mouth 2 (two) times  daily as needed for moderate constipation. 255 g 0   potassium chloride SA (KLOR-CON M) 20 MEQ tablet TAKE 1 TABLET BY MOUTH DAILY 30 tablet 6   pravastatin (PRAVACHOL) 40 MG tablet TAKE 1 TABLET BY MOUTH AT BEDTIME 90 tablet 3   senna-docusate (SENOKOT-S) 8.6-50 MG tablet Take 1 tablet by mouth 2 (two) times daily as needed for moderate constipation.     venlafaxine (EFFEXOR) 75 MG tablet Take 75 mg by mouth in the morning and at bedtime.     XARELTO 15 MG TABS tablet TAKE 1 TABLET BY MOUTH DAILY WITH SUPPER 30 tablet 11   furosemide (LASIX) 20 MG tablet Take 2 tablets (40 mg total) by mouth every morning AND 1 tablet (20 mg  total) every evening. 270 tablet 3   No current facility-administered medications for this encounter.   No Known Allergies   Social History   Socioeconomic History   Marital status: Widowed    Spouse name: Not on file   Number of children: Not on file   Years of education: Not on file   Highest education level: Not on file  Occupational History   Not on file  Tobacco Use   Smoking status: Former   Smokeless tobacco: Never   Tobacco comments:    02/28/2017 "only smoked in the 1960s when we went out"  Vaping Use   Vaping Use: Never used  Substance and Sexual Activity   Alcohol use: No   Drug use: No   Sexual activity: Never  Other Topics Concern   Not on file  Social History Narrative   Not on file   Social Determinants of Health   Financial Resource Strain: Not on file  Food Insecurity: Not on file  Transportation Needs: Not on file  Physical Activity: Not on file  Stress: Not on file  Social Connections: Not on file  Intimate Partner Violence: Not on file   Family history No family history of premature CAD or CHF.   BP (!) 140/80   Pulse 65   Wt 85.9 kg (189 lb 6.4 oz)   SpO2 100%   BMI 31.52 kg/m   Wt Readings from Last 3 Encounters:  06/14/22 85.9 kg (189 lb 6.4 oz)  02/08/22 85.3 kg (188 lb)  10/12/21 84.7 kg (186 lb 12.8 oz)      PHYSICAL EXAM: General:  Well appearing elderly female, kyphotic posture. No respiratory difficulty HEENT: normal Neck: supple. no JVD. Carotids 2+ bilat; no bruits. No lymphadenopathy or thyromegaly appreciated. Cor: PMI nondisplaced. Regular rate & rhythm. No rubs, gallops or murmurs. Lungs: clear Abdomen: soft, nontender, nondistended. No hepatosplenomegaly. No bruits or masses. Good bowel sounds. Extremities: no cyanosis, clubbing, rash, trace b/l LE edema Neuro: alert & oriented x 3, cranial nerves grossly intact. moves all 4 extremities w/o difficulty. Affect pleasant.   ASSESSMENT & PLAN: 1. Chronic systolic=>diastolic HF:  Echo with EF 25-30% 02/2017, repeat echo 10/18 showed EF 35-40%. Nonischemic cardiomyopathy. Medtronic device with His bundle lead was placed.  However, she has a narrow QRS and rarely His paces.  Most recent echo in 7/22 showed EF up to 55-60% with mild LVH and normal RV.  NYHA III (stable). Exam w/ mild peripheral edema. BNP obtained at PCP office elevated at 474.  - Increase Lasix to 40 mg daily x 4 days, then return to regimen of Lasix 40 mg daily alternating with 20 mg daily.   - She was unable to tolerate Jardiance.  Recurrent UTIs  - Continue Toprol XL 100 mg daily.  - Continue hydralazine/Imdur.  2. CKD Stage III: reviewed BMP from PCP office. SCr 1.9 c/w baseline  3. PAF/atrial flutter: occasional PAF noted on device interrogation, < 2hr durations.  - Continue amiodarone. She will need regular eye exams.  Recent LFTs and TSH  ok (7/23). - Continue Xarelto. Denies abnormal bleeding/ falls. CBC w/ stable Hgb  4. CAD: Nonobstructive on 4/18 cath.     - no ischemic chest pain  - Continue pravastatin.   - No ASA given stable CAD and Xarelto use.  5. Bradycardia: She is s/p Medtronic PPM with His bundle pacing, primarily a-pacing with rare His bundle pacing given narrow QRS.  6. HTN: Controlled on current regimen 7. Vascular  dementia: New diagnosis. Has  frequent hallucinations.  - has appt w/ neurology tomorrow.  Follow up in 2 months with Dr. Aundra Dubin.  Lyda Jester PA-C  06/14/2022

## 2022-06-14 NOTE — Patient Instructions (Addendum)
EKG done today.   No Labs done today.   DECREASE Lasix to '40mg'$  (2 tablets) by mouth daily for 4 days THEN INCREASE back to 2 tablets by mouth every morning and 1 tablet by mouth every evening.  No other medication changes were made. Please continue all current medications as prescribed.  Your physician recommends that you schedule a follow-up appointment in: 2-3 months  If you have any questions or concerns before your next appointment please send [place] a message through [place] or call our office at [phone].    TO LEAVE A MESSAGE FOR THE NURSE SELECT OPTION 2, PLEASE LEAVE A MESSAGE INCLUDING: YOUR NAME DATE OF BIRTH CALL BACK NUMBER REASON FOR CALL**this is important as we prioritize the call backs  YOU WILL RECEIVE A CALL BACK THE SAME DAY AS LONG AS YOU CALL BEFORE 4:00 PM   Do the following things EVERYDAY: Weigh yourself in the morning before breakfast. Write it down and keep it in a log. Take your medicines as prescribed Eat low salt foods--Limit salt (sodium) to 2000 mg per day.  Stay as active as you can everyday Limit all fluids for the day to less than 2 liters   At the [facility], you and your health needs are our priority. As part of our continuing mission to provide you with exceptional heart care, we have created designated Provider Care Teams. These Care Teams include your primary Cardiologist (physician) and Advanced Practice Providers (APPs- Physician Assistants and Nurse Practitioners) who all work together to provide you with the care you need, when you need it.   You may see any of the following providers on your designated Care Team at your next follow up: Dr Glori Bickers Dr Haynes Kerns, NP Lyda Jester, [place] Audry Riles, PharmD   Please be sure to bring in all your medications bottles to every appointment.

## 2022-06-23 ENCOUNTER — Other Ambulatory Visit (HOSPITAL_COMMUNITY): Payer: Self-pay

## 2022-06-23 MED ORDER — PRAVASTATIN SODIUM 40 MG PO TABS: 40.0000 mg | ORAL_TABLET | Freq: Every day | ORAL | 1 refills | Status: DC

## 2022-07-01 ENCOUNTER — Other Ambulatory Visit: Payer: Self-pay | Admitting: Cardiology

## 2022-07-25 ENCOUNTER — Ambulatory Visit (INDEPENDENT_AMBULATORY_CARE_PROVIDER_SITE_OTHER): Payer: Medicare Other

## 2022-07-25 DIAGNOSIS — I495 Sick sinus syndrome: Secondary | ICD-10-CM | POA: Diagnosis not present

## 2022-07-26 LAB — CUP PACEART REMOTE DEVICE CHECK
Battery Remaining Longevity: 71 mo
Battery Voltage: 2.97 V
Brady Statistic AP VP Percent: 21.08 %
Brady Statistic AP VS Percent: 68.97 %
Brady Statistic AS VP Percent: 4.79 %
Brady Statistic AS VS Percent: 5.15 %
Brady Statistic RA Percent Paced: 90.6 %
Brady Statistic RV Percent Paced: 26 %
Date Time Interrogation Session: 20230911013604
Implantable Lead Implant Date: 20190107
Implantable Lead Implant Date: 20190107
Implantable Lead Location: 753859
Implantable Lead Location: 753860
Implantable Lead Model: 3830
Implantable Lead Model: 5076
Implantable Pulse Generator Implant Date: 20190107
Lead Channel Impedance Value: 247 Ohm
Lead Channel Impedance Value: 304 Ohm
Lead Channel Impedance Value: 342 Ohm
Lead Channel Impedance Value: 380 Ohm
Lead Channel Pacing Threshold Amplitude: 0.875 V
Lead Channel Pacing Threshold Pulse Width: 0.4 ms
Lead Channel Sensing Intrinsic Amplitude: 1.125 mV
Lead Channel Sensing Intrinsic Amplitude: 1.125 mV
Lead Channel Sensing Intrinsic Amplitude: 2.25 mV
Lead Channel Sensing Intrinsic Amplitude: 2.25 mV
Lead Channel Setting Pacing Amplitude: 1.75 V
Lead Channel Setting Pacing Amplitude: 3 V
Lead Channel Setting Pacing Pulse Width: 1 ms
Lead Channel Setting Sensing Sensitivity: 0.9 mV

## 2022-08-01 ENCOUNTER — Other Ambulatory Visit (HOSPITAL_COMMUNITY): Payer: Self-pay

## 2022-08-01 MED ORDER — HYDRALAZINE HCL 50 MG PO TABS: ORAL_TABLET | ORAL | 3 refills | Status: DC

## 2022-08-11 NOTE — Progress Notes (Signed)
Remote pacemaker transmission.   

## 2022-08-22 ENCOUNTER — Ambulatory Visit (HOSPITAL_COMMUNITY)
Admission: RE | Admit: 2022-08-22 | Discharge: 2022-08-22 | Disposition: A | Discharge status: Home / Self Care | Payer: Medicare Other | Source: Ambulatory Visit | Attending: Cardiology | Admitting: Cardiology

## 2022-08-22 VITALS — BP 140/90 | HR 65 | Wt 198.0 lb

## 2022-08-22 DIAGNOSIS — I350 Nonrheumatic aortic (valve) stenosis: Secondary | ICD-10-CM | POA: Diagnosis not present

## 2022-08-22 DIAGNOSIS — Z8673 Personal history of transient ischemic attack (TIA), and cerebral infarction without residual deficits: Secondary | ICD-10-CM | POA: Insufficient documentation

## 2022-08-22 DIAGNOSIS — E1122 Type 2 diabetes mellitus with diabetic chronic kidney disease: Secondary | ICD-10-CM | POA: Diagnosis not present

## 2022-08-22 DIAGNOSIS — Z45010 Encounter for checking and testing of cardiac pacemaker pulse generator [battery]: Secondary | ICD-10-CM | POA: Insufficient documentation

## 2022-08-22 DIAGNOSIS — I2510 Atherosclerotic heart disease of native coronary artery without angina pectoris: Secondary | ICD-10-CM | POA: Insufficient documentation

## 2022-08-22 DIAGNOSIS — I4892 Unspecified atrial flutter: Secondary | ICD-10-CM | POA: Insufficient documentation

## 2022-08-22 DIAGNOSIS — Z7901 Long term (current) use of anticoagulants: Secondary | ICD-10-CM | POA: Insufficient documentation

## 2022-08-22 DIAGNOSIS — I5042 Chronic combined systolic (congestive) and diastolic (congestive) heart failure: Secondary | ICD-10-CM | POA: Insufficient documentation

## 2022-08-22 DIAGNOSIS — I5032 Chronic diastolic (congestive) heart failure: Secondary | ICD-10-CM | POA: Diagnosis not present

## 2022-08-22 DIAGNOSIS — N183 Chronic kidney disease, stage 3 unspecified: Secondary | ICD-10-CM | POA: Insufficient documentation

## 2022-08-22 DIAGNOSIS — I480 Paroxysmal atrial fibrillation: Secondary | ICD-10-CM | POA: Insufficient documentation

## 2022-08-22 DIAGNOSIS — I130 Hypertensive heart and chronic kidney disease with heart failure and stage 1 through stage 4 chronic kidney disease, or unspecified chronic kidney disease: Secondary | ICD-10-CM | POA: Insufficient documentation

## 2022-08-22 DIAGNOSIS — E785 Hyperlipidemia, unspecified: Secondary | ICD-10-CM | POA: Diagnosis not present

## 2022-08-22 DIAGNOSIS — Z79899 Other long term (current) drug therapy: Secondary | ICD-10-CM | POA: Diagnosis not present

## 2022-08-22 DIAGNOSIS — Z87440 Personal history of urinary (tract) infections: Secondary | ICD-10-CM | POA: Insufficient documentation

## 2022-08-22 DIAGNOSIS — I428 Other cardiomyopathies: Secondary | ICD-10-CM | POA: Diagnosis not present

## 2022-08-22 DIAGNOSIS — K219 Gastro-esophageal reflux disease without esophagitis: Secondary | ICD-10-CM | POA: Insufficient documentation

## 2022-08-22 LAB — BASIC METABOLIC PANEL
Anion gap: 8 (ref 5–15)
BUN: 27 mg/dL — ABNORMAL HIGH (ref 8–23)
CO2: 25 mmol/L (ref 22–32)
Calcium: 9.1 mg/dL (ref 8.9–10.3)
Chloride: 111 mmol/L (ref 98–111)
Creatinine, Ser: 1.74 mg/dL — ABNORMAL HIGH (ref 0.44–1.00)
GFR, Estimated: 28 mL/min — ABNORMAL LOW (ref >60)
Glucose, Bld: 208 mg/dL — ABNORMAL HIGH (ref 70–99)
Potassium: 4.8 mmol/L (ref 3.5–5.1)
Sodium: 144 mmol/L (ref 135–145)

## 2022-08-22 LAB — BRAIN NATRIURETIC PEPTIDE: B Natriuretic Peptide: 440.5 pg/mL — ABNORMAL HIGH (ref 0.0–100.0)

## 2022-08-22 MED ORDER — METOPROLOL SUCCINATE ER 50 MG PO TB24: 50.0000 mg | ORAL_TABLET | Freq: Every day | ORAL | 3 refills | Status: DC

## 2022-08-22 MED ORDER — AMLODIPINE BESYLATE 5 MG PO TABS: 5.0000 mg | ORAL_TABLET | Freq: Every day | ORAL | 3 refills | Status: DC

## 2022-08-22 MED ORDER — FUROSEMIDE 20 MG PO TABS: 40.0000 mg | ORAL_TABLET | Freq: Every day | ORAL | 3 refills | Status: DC

## 2022-08-22 NOTE — Progress Notes (Signed)
Advanced Heart Failure Clinic Note   Primary Care: [facility], [place] HF Cardiology: Dr. Aundra Dubin  HPI:  Shannon Lucas is a 88 y.o. female with PMH of Systolic CHF,  PAF on Xarelto, CKD stage III, DM II, GERD, HLD, CVA, anemia.   Admitted 4/17 -> 03/08/94 with A/C systolic CHF. L/RHC as below with normal coronaries and elevated filling pressures. Diuresed 5 lbs. Hospital course complicated by hematoma at radial cath site that improved overnight. Medications adjusted as tolerated. Discharge weight 205 lbs.    Pt seen in ED 03/08/17 for worsening of hematoma as above.  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 Dr. Donnetta Hutching saw in consult and repaired under local anaesthesia and sedation.   Echo in 10/18 showed EF 35-40% with diffuse hypokinesis, PASP 50 mmHg.  Holter in 10/18 showed runs of atrial fibrillation including afib with aberrancy.  Bradycardia into the 30s was also noted.  Decision was made to place PPM => patient got Medtronic device with His bundle lead and is now His pacing.    Echo in 2/20 showed EF up to 60-65%, mild LVH, normal RV.   She was admitted in 4/21 with fall, rib fractures, sepsis due to UTI, and CHF exacerbation. She was in and out of atrial fibrillation in the hospital.  She was diuresed.   Echo in 6/21 with EF 60-65%, normal RV, mild AS.   She was admitted in 3/22 with delirium and hallucinations, ?related to sinusitis.   Echo in 7/22 showed EF 55-60%, mild LVH, normal RV, mild AS.    She presents today for followup of CHF.  She walks with a walker for balance.  She notes dyspnea and fatigue walking longer distances.  No chest pain.  She has 2 pillow orthopnea.  No PND.  No palpitations.  Weight is up 9 lbs.   Medtronic PPM interrogation: 21% His-pacing, 92% A-pacing, rare short AF runs noted.   ECG (personally reviewed): a-paced with long PR interval, LAFB  Labs (04/06/17): K 5.0, Creatinine 2.04, BUN 43, LFTs normal Labs (6/18): K 3.9, creatinine 1.68 Labs (8/18): LDL 83,  HDL 51, hgb 11.1, K 4.5, creatinine 1.48, LFTs normal, TSH normal.  Labs (1/19): K 4, creatinine 1.5, hgb 12.2, TSH normal, LFTs normal Labs (7/19): K 4.3, creatinine 1.78, LDL 105, TSH normal, LFTs normal Labs (1/20): K 3.9, creatinine 1.95 Labs (4/21): K 3.9, creatinine 1.86 Labs (5/21): LDL 64 Labs (6/21): K 4.1, creatinine 1.8, hgb 9.8 Labs (8/21): K 3.5, creatinine 1.75 Labs (11/21): LDL 66 Labs (3/22): K 4, creatinine 1.7 Labs (8/22): hgb 11.8, K 4.8, creatinine 1.79, LFTs normal, TSH normal Labs (7/23): K 4.8, creatinine 1.97, LDL 70, TGs 59  Review of systems complete and found to be negative unless listed in HPI.    PMH 1. Chronic systolic CHF: Nonischemic cardiomyopathy.  - Echo 03/01/17 LVEF 25-30%, At least mild AS, Mild MR, Mod LAE, Mod RAE, Trivial PI, PA peak pressure 42 mm Hg. - LHC (4/18) with nonobstructive CAD.  - Echo (10/18): EF 35-40%, diffuse hypokinesis, mild LVH, mild MR, mild AS, PASP 50 mmHg.  - Medtronic PPM with His bundle lead placed.  - Echo (2/20): EF 60-65%, mild LVH, normal RV size and systolic function.  - Echo (6/21): EF 60-65%, mild LVH, RV normal, PASP 47 mmHg, mild AS mean gradient 14 mmHg.  - Echo (7/22): EF 55-60%, mild LVH, normal RV, mild AS. 2. Atrial fibrillation: Paroxysmal. On Xarelto and amiodarone.  - Atypical atrial flutter noted 05/28/18.  3. CKD Stage III 4. DMII 5. GERD 6. HLD 7. Hx of CVA 8. Chronic anemia 9. CAD: LHC (4/18) with 70% ostial D1, 50% ostial D2, 40% ostial OM1.  10. Aortic stenosis: Mild on echo in 6/21.  Mild on 7/22 echo.  11. 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  12. Vascular dementia  Current Outpatient Medications  Medication Sig Dispense Refill   acetaminophen (TYLENOL) 500 MG tablet Take 1,000 mg by mouth every 6 (six) hours as needed for moderate pain or headache.     amiodarone (PACERONE) 200 MG tablet TAKE 1 TABLET BY MOUTH DAIY 30 tablet 3   amLODipine (NORVASC) 5 MG tablet Take 1 tablet  (5 mg total) by mouth daily. 90 tablet 3   benzonatate (TESSALON) 100 MG capsule Take 100 mg by mouth 3 (three) times daily as needed for cough.     ergocalciferol (VITAMIN D2) 50000 units capsule Take 50,000 Units by mouth every Friday. At night     ferrous sulfate 325 (65 FE) MG tablet Take 1 tablet (325 mg total) by mouth 2 (two) times daily with a meal. 90 tablet 6   hydrALAZINE (APRESOLINE) 50 MG tablet TAKE 1&1/2 TABLETS BY MOUTH 3 TIMES DAILY 135 tablet 3   insulin glargine (LANTUS) 100 UNIT/ML injection Inject 50 Units into the skin daily.     ipratropium (ATROVENT HFA) 17 MCG/ACT inhaler Inhale 2 puffs into the lungs every 6 (six) hours as needed for wheezing. 1 Inhaler 12   isosorbide mononitrate (IMDUR) 30 MG 24 hr tablet Take 1 tablet (30 mg total) by mouth daily. 90 tablet 3   levothyroxine (SYNTHROID) 50 MCG tablet Take 50 mcg by mouth daily.     Melatonin 1 MG CAPS Take by mouth daily as needed.     OLANZapine (ZYPREXA) 2.5 MG tablet Take 2.5 mg by mouth at bedtime.     omeprazole (PRILOSEC) 20 MG capsule Take 20 mg by mouth daily.     polyethylene glycol powder (MIRALAX) 17 GM/SCOOP powder Take 17 g by mouth 2 (two) times daily as needed for moderate constipation. 255 g 0   potassium chloride SA (KLOR-CON M) 20 MEQ tablet TAKE 1 TABLET BY MOUTH DAILY 30 tablet 6   pravastatin (PRAVACHOL) 40 MG tablet Take 1 tablet (40 mg total) by mouth at bedtime. 90 tablet 1   senna-docusate (SENOKOT-S) 8.6-50 MG tablet Take 1 tablet by mouth 2 (two) times daily as needed for moderate constipation.     venlafaxine (EFFEXOR) 75 MG tablet Take 75 mg by mouth in the morning and at bedtime.     XARELTO 15 MG TABS tablet TAKE 1 TABLET BY MOUTH DAILY WITH SUPPER 30 tablet 11   cephALEXin (KEFLEX) 250 MG capsule Take 250 mg by mouth 2 (two) times daily. (Patient not taking: Reported on 08/22/2022)     furosemide (LASIX) 20 MG tablet Take 2 tablets (40 mg total) by mouth daily. 90 tablet 3   metoprolol  succinate (TOPROL-XL) 50 MG 24 hr tablet Take 1 tablet (50 mg total) by mouth daily. TAKE 1 TABLET BY MOUTH DAILY IMMEDIATELYFOLLOWING A MEAL 90 tablet 3   No current facility-administered medications for this encounter.   No Known Allergies   Social History   Socioeconomic History   Marital status: Widowed    Spouse name: Not on file   Number of children: Not on file   Years of education: Not on file   Highest education level: Not on file  Occupational  History   Not on file  Tobacco Use   Smoking status: Former   Smokeless tobacco: Never   Tobacco comments:    02/28/2017 "only smoked in the 1960s when we went out"  Vaping Use   Vaping Use: Never used  Substance and Sexual Activity   Alcohol use: No   Drug use: No   Sexual activity: Never  Other Topics Concern   Not on file  Social History Narrative   Not on file   Social Determinants of Health   Financial Resource Strain: Not on file  Food Insecurity: Not on file  Transportation Needs: Not on file  Physical Activity: Not on file  Stress: Not on file  Social Connections: Not on file  Intimate Partner Violence: Not on file   Family history No family history of premature CAD or CHF.   Vitals:   08/22/22 1349  BP: (!) 140/90  Pulse: 65  SpO2: 97%  Weight: 89.8 kg (198 lb)     Wt Readings from Last 3 Encounters:  08/22/22 89.8 kg (198 lb)  06/14/22 85.9 kg (189 lb 6.4 oz)  02/08/22 85.3 kg (188 lb)    PHYSICAL EXAM: General: NAD Neck: JVP 8-9 cm with HJR, no thyromegaly or thyroid nodule.  Lungs: Clear to auscultation bilaterally with normal respiratory effort. CV: Nondisplaced PMI.  Heart regular S1/S2, no S3/S4, 2/6 SEM RUSB.  1+ edema 1/2 to knees bilaterally.  No carotid bruit.  Normal pedal pulses.  Abdomen: Soft, nontender, no hepatosplenomegaly, no distention.  Skin: Intact without lesions or rashes.  Neurologic: Alert and oriented x 3.  Psych: Normal affect. Extremities: No clubbing or cyanosis.   HEENT: Normal.   ASSESSMENT & PLAN:  1. Chronic systolic=>diastolic HF:  Echo with EF 25-30% 02/2017, repeat echo 10/18 showed EF 35-40%. Nonischemic cardiomyopathy. Medtronic device with His bundle lead was placed.  Most recent echo in 7/22 showed EF up to 55-60% with mild LVH and normal RV.  NYHA III currently. Weight is up and she is volume overloaded on exam.  She is His pacing about 21% of the time now (increased) likely due to widening of the PR interval.  - Increase Lasix to 40 qam/20 qpm x 3 days then 40 mg daily after that.  BMET/BNP today, BMET in 10 days.  - She was unable to tolerate Jardiance due to frequent UTIs.    - Decrease Toprol XL to 50 mg daily with prolonged PR interval.  - Continue hydralazine/Imdur.  2. CKD: Stage III - BMET today.  3. PAF/atrial flutter: Rare short runs of atrial fibrillation on device interrogation.  - Continue amiodarone.  Check TSH and LFTs. She will need regular eye exams.   - Continue Xarelto.  4. CAD: Nonobstructive on 4/18 cath.     - Continue pravastatin, good lipids in 7/23.    - No ASA given stable CAD and Xarelto use.  5. Bradycardia: She is s/p Medtronic PPM with His bundle pacing, primarily a-pacing with 21% His bundle pacing. His pacing is higher percentage now due to prolonged PR interval.  6. HTN: As above, decreasing Toprol XL.  BP is already mildly elevated, so will add amlodipine 5 mg daily.    Followup in 6 wks with APP.   Loralie Champagne 08/22/2022

## 2022-08-22 NOTE — Patient Instructions (Signed)
DECREASE Toprol Xl to 50 mg daily.  START Amlodipine 5 mg daily.  CHANGE lasix to 40 mg in the morning and 20 mg in the evening for 3 days , then change to 40 mg daily.  Labs done today, your results will be available in MyChart, we will contact you for abnormal readings.  Repeat blood work in 10 days.  Your physician recommends that you schedule a follow-up appointment in: 6 weeks  If you have any questions or concerns before your next appointment please send [place] a message through [place] or call our office at [phone].    TO LEAVE A MESSAGE FOR THE NURSE SELECT OPTION 2, PLEASE LEAVE A MESSAGE INCLUDING: YOUR NAME DATE OF BIRTH CALL BACK NUMBER REASON FOR CALL**this is important as we prioritize the call backs  YOU WILL RECEIVE A CALL BACK THE SAME DAY AS LONG AS YOU CALL BEFORE 4:00 PM  At the [facility], you and your health needs are our priority. As part of our continuing mission to provide you with exceptional heart care, we have created designated Provider Care Teams. These Care Teams include your primary Cardiologist (physician) and Advanced Practice Providers (APPs- Physician Assistants and Nurse Practitioners) who all work together to provide you with the care you need, when you need it.   You may see any of the following providers on your designated Care Team at your next follow up: Dr Glori Bickers Dr Loralie Champagne Dr. Roxana Hires, NP Lyda Jester, [place] [facility] [place], [place] Forestine Na, NP Audry Riles, PharmD   Please be sure to bring in all your medications bottles to every appointment.

## 2022-09-07 ENCOUNTER — Other Ambulatory Visit (HOSPITAL_COMMUNITY): Payer: Self-pay | Admitting: Cardiology

## 2022-09-08 LAB — BASIC METABOLIC PANEL
BUN/Creatinine Ratio: 18 (ref 12–28)
BUN: 33 mg/dL — ABNORMAL HIGH (ref 8–27)
CO2: 20 mmol/L (ref 20–29)
Calcium: 9.5 mg/dL (ref 8.7–10.3)
Chloride: 106 mmol/L (ref 96–106)
Creatinine, Ser: 1.79 mg/dL — ABNORMAL HIGH (ref 0.57–1.00)
Glucose: 114 mg/dL — ABNORMAL HIGH (ref 70–99)
Potassium: 4.4 mmol/L (ref 3.5–5.2)
Sodium: 143 mmol/L (ref 134–144)
eGFR: 27 mL/min/{1.73_m2} — ABNORMAL LOW (ref >59)

## 2022-10-03 ENCOUNTER — Ambulatory Visit (HOSPITAL_COMMUNITY)
Admission: RE | Admit: 2022-10-03 | Discharge: 2022-10-03 | Disposition: A | Discharge status: Home / Self Care | Payer: Medicare Other | Source: Ambulatory Visit | Attending: Family Medicine | Admitting: Family Medicine

## 2022-10-03 ENCOUNTER — Encounter (HOSPITAL_COMMUNITY): Payer: Self-pay

## 2022-10-03 VITALS — BP 160/86 | HR 89 | Wt 201.0 lb

## 2022-10-03 DIAGNOSIS — I2510 Atherosclerotic heart disease of native coronary artery without angina pectoris: Secondary | ICD-10-CM | POA: Insufficient documentation

## 2022-10-03 DIAGNOSIS — Z8673 Personal history of transient ischemic attack (TIA), and cerebral infarction without residual deficits: Secondary | ICD-10-CM | POA: Insufficient documentation

## 2022-10-03 DIAGNOSIS — I130 Hypertensive heart and chronic kidney disease with heart failure and stage 1 through stage 4 chronic kidney disease, or unspecified chronic kidney disease: Secondary | ICD-10-CM | POA: Diagnosis present

## 2022-10-03 DIAGNOSIS — E1122 Type 2 diabetes mellitus with diabetic chronic kidney disease: Secondary | ICD-10-CM | POA: Insufficient documentation

## 2022-10-03 DIAGNOSIS — I350 Nonrheumatic aortic (valve) stenosis: Secondary | ICD-10-CM | POA: Diagnosis not present

## 2022-10-03 DIAGNOSIS — I4892 Unspecified atrial flutter: Secondary | ICD-10-CM | POA: Insufficient documentation

## 2022-10-03 DIAGNOSIS — N183 Chronic kidney disease, stage 3 unspecified: Secondary | ICD-10-CM

## 2022-10-03 DIAGNOSIS — I428 Other cardiomyopathies: Secondary | ICD-10-CM | POA: Insufficient documentation

## 2022-10-03 DIAGNOSIS — E785 Hyperlipidemia, unspecified: Secondary | ICD-10-CM | POA: Insufficient documentation

## 2022-10-03 DIAGNOSIS — Z79899 Other long term (current) drug therapy: Secondary | ICD-10-CM | POA: Insufficient documentation

## 2022-10-03 DIAGNOSIS — Z87440 Personal history of urinary (tract) infections: Secondary | ICD-10-CM | POA: Diagnosis not present

## 2022-10-03 DIAGNOSIS — I5042 Chronic combined systolic (congestive) and diastolic (congestive) heart failure: Secondary | ICD-10-CM | POA: Insufficient documentation

## 2022-10-03 DIAGNOSIS — I872 Venous insufficiency (chronic) (peripheral): Secondary | ICD-10-CM | POA: Diagnosis not present

## 2022-10-03 DIAGNOSIS — Z7901 Long term (current) use of anticoagulants: Secondary | ICD-10-CM | POA: Diagnosis present

## 2022-10-03 DIAGNOSIS — I5032 Chronic diastolic (congestive) heart failure: Secondary | ICD-10-CM | POA: Diagnosis not present

## 2022-10-03 DIAGNOSIS — I10 Essential (primary) hypertension: Secondary | ICD-10-CM

## 2022-10-03 DIAGNOSIS — R001 Bradycardia, unspecified: Secondary | ICD-10-CM

## 2022-10-03 DIAGNOSIS — I480 Paroxysmal atrial fibrillation: Secondary | ICD-10-CM | POA: Insufficient documentation

## 2022-10-03 LAB — BASIC METABOLIC PANEL
Anion gap: 8 (ref 5–15)
BUN: 28 mg/dL — ABNORMAL HIGH (ref 8–23)
CO2: 24 mmol/L (ref 22–32)
Calcium: 9.3 mg/dL (ref 8.9–10.3)
Chloride: 110 mmol/L (ref 98–111)
Creatinine, Ser: 1.82 mg/dL — ABNORMAL HIGH (ref 0.44–1.00)
GFR, Estimated: 26 mL/min — ABNORMAL LOW (ref >60)
Glucose, Bld: 50 mg/dL — ABNORMAL LOW (ref 70–99)
Potassium: 4.1 mmol/L (ref 3.5–5.1)
Sodium: 142 mmol/L (ref 135–145)

## 2022-10-03 LAB — BRAIN NATRIURETIC PEPTIDE: B Natriuretic Peptide: 552.9 pg/mL — ABNORMAL HIGH (ref 0.0–100.0)

## 2022-10-03 MED ORDER — POTASSIUM CHLORIDE CRYS ER 20 MEQ PO TBCR: 20.0000 meq | EXTENDED_RELEASE_TABLET | Freq: Every day | ORAL | 7 refills | Status: DC

## 2022-10-03 MED ORDER — FUROSEMIDE 20 MG PO TABS: 40.0000 mg | ORAL_TABLET | Freq: Every day | ORAL | 3 refills | Status: DC

## 2022-10-03 MED ORDER — HYDRALAZINE HCL 50 MG PO TABS: 100.0000 mg | ORAL_TABLET | Freq: Three times a day (TID) | ORAL | 3 refills | Status: DC

## 2022-10-03 MED ORDER — AMLODIPINE BESYLATE 10 MG PO TABS: 10.0000 mg | ORAL_TABLET | Freq: Every day | ORAL | 3 refills | Status: DC

## 2022-10-03 NOTE — Progress Notes (Signed)
Advanced Heart Failure Clinic Note   Primary Care: [facility], [place] HF Cardiology: Dr. Aundra Dubin  HPI:  Shannon Lucas is a 88 y.o. female with PMH of Systolic CHF,  PAF on Xarelto, CKD stage III, DM II, GERD, HLD, CVA, anemia.   Admitted 4/17 -> 2/37/62 with A/C systolic CHF. L/RHC as below with normal coronaries and elevated filling pressures. Diuresed 5 lbs. Hospital course complicated by hematoma at radial cath site that improved overnight. Medications adjusted as tolerated. Discharge weight 205 lbs.    Pt seen in ED 03/08/17 for worsening of hematoma as above.  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 Dr. Donnetta Hutching saw in consult and repaired under local anaesthesia and sedation.   Echo in 10/18 showed EF 35-40% with diffuse hypokinesis, PASP 50 mmHg.  Holter in 10/18 showed runs of atrial fibrillation including afib with aberrancy.  Bradycardia into the 30s was also noted.  Decision was made to place PPM => patient got Medtronic device with His bundle lead and is now His pacing.    Echo in 2/20 showed EF up to 60-65%, mild LVH, normal RV.   She was admitted in 4/21 with fall, rib fractures, sepsis due to UTI, and CHF exacerbation. She was in and out of atrial fibrillation in the hospital.  She was diuresed.   Echo in 6/21 with EF 60-65%, normal RV, mild AS.   She was admitted in 3/22 with delirium and hallucinations, ?related to sinusitis.   Echo in 7/22 showed EF 55-60%, mild LVH, normal RV, mild AS.    Follow up 10/23, NYHA III and volume overloaded. Lasix increased and Toprol decreased with prolonged PR.  Today she returns for HF follow up. Overall feeling fine. Denies increasing SOB, CP, dizziness, edema, or PND/Orthopnea. Appetite ok. No fever or chills. She is not weighing at home. Taking all medications.   Medtronic PPM interrogation: 17.4% His pacing,  66.1% A-pacing, rare short AF runs noted, 0.7 hr/day activity.   ReDs: 35%  Labs (04/06/17): K 5.0, Creatinine 2.04, BUN 43, LFTs  normal Labs (6/18): K 3.9, creatinine 1.68 Labs (8/18): LDL 83, HDL 51, hgb 11.1, K 4.5, creatinine 1.48, LFTs normal, TSH normal.  Labs (1/19): K 4, creatinine 1.5, hgb 12.2, TSH normal, LFTs normal Labs (7/19): K 4.3, creatinine 1.78, LDL 105, TSH normal, LFTs normal Labs (1/20): K 3.9, creatinine 1.95 Labs (4/21): K 3.9, creatinine 1.86 Labs (5/21): LDL 64 Labs (6/21): K 4.1, creatinine 1.8, hgb 9.8 Labs (8/21): K 3.5, creatinine 1.75 Labs (11/21): LDL 66 Labs (3/22): K 4, creatinine 1.7 Labs (8/22): hgb 11.8, K 4.8, creatinine 1.79, LFTs normal, TSH normal Labs (7/23): K 4.8, creatinine 1.97, LDL 70, TGs 59, LFTs normal, TSH normal Labs (10/23): K 4.4, creatinine 1.79  Review of systems complete and found to be negative unless listed in HPI.    PMH 1. Chronic systolic CHF: Nonischemic cardiomyopathy.  - Echo 03/01/17 LVEF 25-30%, At least mild AS, Mild MR, Mod LAE, Mod RAE, Trivial PI, PA peak pressure 42 mm Hg. - LHC (4/18) with nonobstructive CAD.  - Echo (10/18): EF 35-40%, diffuse hypokinesis, mild LVH, mild MR, mild AS, PASP 50 mmHg.  - Medtronic PPM with His bundle lead placed.  - Echo (2/20): EF 60-65%, mild LVH, normal RV size and systolic function.  - Echo (6/21): EF 60-65%, mild LVH, RV normal, PASP 47 mmHg, mild AS mean gradient 14 mmHg.  - Echo (7/22): EF 55-60%, mild LVH, normal RV, mild AS. 2. Atrial fibrillation: Paroxysmal.  On Xarelto and amiodarone.  - Atypical atrial flutter noted 05/28/18.  3. CKD Stage III 4. DMII 5. GERD 6. HLD 7. Hx of CVA 8. Chronic anemia 9. CAD: LHC (4/18) with 70% ostial D1, 50% ostial D2, 40% ostial OM1.  10. Aortic stenosis: Mild on echo in 6/21.  Mild on 7/22 echo.  11. 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  12. Vascular dementia  Current Outpatient Medications  Medication Sig Dispense Refill   acetaminophen (TYLENOL) 500 MG tablet Take 1,000 mg by mouth every 6 (six) hours as needed for moderate pain or headache.      amiodarone (PACERONE) 200 MG tablet TAKE 1 TABLET BY MOUTH DAIY 30 tablet 3   amLODipine (NORVASC) 5 MG tablet Take 1 tablet (5 mg total) by mouth daily. 90 tablet 3   benzonatate (TESSALON) 100 MG capsule Take 100 mg by mouth 3 (three) times daily as needed for cough.     cephALEXin (KEFLEX) 250 MG capsule Take 250 mg by mouth 2 (two) times daily.     ergocalciferol (VITAMIN D2) 50000 units capsule Take 50,000 Units by mouth every Friday. At night     ferrous sulfate 325 (65 FE) MG tablet Take 1 tablet (325 mg total) by mouth 2 (two) times daily with a meal. 90 tablet 6   furosemide (LASIX) 20 MG tablet Take 2 tablets (40 mg total) by mouth daily. 90 tablet 3   hydrALAZINE (APRESOLINE) 50 MG tablet TAKE 1&1/2 TABLETS BY MOUTH 3 TIMES DAILY 135 tablet 3   insulin glargine (LANTUS) 100 UNIT/ML injection Inject 50 Units into the skin daily.     ipratropium (ATROVENT HFA) 17 MCG/ACT inhaler Inhale 2 puffs into the lungs every 6 (six) hours as needed for wheezing. 1 Inhaler 12   isosorbide mononitrate (IMDUR) 30 MG 24 hr tablet Take 1 tablet (30 mg total) by mouth daily. 90 tablet 3   levothyroxine (SYNTHROID) 50 MCG tablet Take 50 mcg by mouth daily.     Melatonin 1 MG CAPS Take by mouth daily as needed.     metoprolol succinate (TOPROL-XL) 50 MG 24 hr tablet Take 1 tablet (50 mg total) by mouth daily. TAKE 1 TABLET BY MOUTH DAILY IMMEDIATELYFOLLOWING A MEAL 90 tablet 3   OLANZapine (ZYPREXA) 2.5 MG tablet Take 2.5 mg by mouth at bedtime.     omeprazole (PRILOSEC) 20 MG capsule Take 20 mg by mouth daily.     polyethylene glycol powder (MIRALAX) 17 GM/SCOOP powder Take 17 g by mouth 2 (two) times daily as needed for moderate constipation. 255 g 0   potassium chloride SA (KLOR-CON M) 20 MEQ tablet TAKE 1 TABLET BY MOUTH DAILY 30 tablet 6   pravastatin (PRAVACHOL) 40 MG tablet Take 1 tablet (40 mg total) by mouth at bedtime. 90 tablet 1   senna-docusate (SENOKOT-S) 8.6-50 MG tablet Take 1 tablet by  mouth 2 (two) times daily as needed for moderate constipation.     venlafaxine (EFFEXOR) 75 MG tablet Take 75 mg by mouth in the morning and at bedtime.     XARELTO 15 MG TABS tablet TAKE 1 TABLET BY MOUTH DAILY WITH SUPPER 30 tablet 11   No current facility-administered medications for this encounter.   No Known Allergies   Social History   Socioeconomic History   Marital status: Widowed    Spouse name: Not on file   Number of children: Not on file   Years of education: Not on file   Highest education  level: Not on file  Occupational History   Not on file  Tobacco Use   Smoking status: Former   Smokeless tobacco: Never   Tobacco comments:    02/28/2017 "only smoked in the 1960s when we went out"  Vaping Use   Vaping Use: Never used  Substance and Sexual Activity   Alcohol use: No   Drug use: No   Sexual activity: Never  Other Topics Concern   Not on file  Social History Narrative   Not on file   Social Determinants of Health   Financial Resource Strain: Not on file  Food Insecurity: Not on file  Transportation Needs: Not on file  Physical Activity: Not on file  Stress: Not on file  Social Connections: Not on file  Intimate Partner Violence: Not on file   Family history No family history of premature CAD or CHF.   BP (!) 160/86   Pulse 89   Wt 91.2 kg (201 lb)   SpO2 97%   BMI 33.45 kg/m    Wt Readings from Last 3 Encounters:  10/03/22 91.2 kg (201 lb)  08/22/22 89.8 kg (198 lb)  06/14/22 85.9 kg (189 lb 6.4 oz)    PHYSICAL EXAM: General:  NAD. No resp difficulty, walked into clinic with rolling walker. HEENT: Normal Neck: Supple. JVP to jaw. Carotids 2+ bilat; no bruits. No lymphadenopathy or thryomegaly appreciated. Cor: PMI nondisplaced. Regular rate & rhythm. No rubs, gallops, 2/6 SEM RUSB Lungs: Clear Abdomen: Soft, nontender, nondistended. No hepatosplenomegaly. No bruits or masses. Good bowel sounds. Extremities: No cyanosis, clubbing, rash,  1-2+ BLE edema to knees, L>R Neuro: Alert & oriented x 3, cranial nerves grossly intact. Moves all 4 extremities w/o difficulty. Affect pleasant.  ASSESSMENT & PLAN: 1. Chronic systolic=>diastolic HF:  Echo with EF 25-30% 02/2017, repeat echo 10/18 showed EF 35-40%. Nonischemic cardiomyopathy. Medtronic device with His bundle lead was placed.  Most recent echo in 7/22 showed EF up to 55-60% with mild LVH and normal RV.  NYHA III currently. Weight remains up and she is volume overloaded on exam, though not markedly so, ReDs 35%.  She is His pacing about 17% of the time now (increased) likely due to widening of the PR interval.  - Increase Lasix to 40 mg bid x 3 days, then back to 40 mg daily. BMET/BNP today, repeat BMET in 10 days (Given Rx for [facility]). - Increase KCL to 20 bid x 3 days, then back to 20 daily. - Continue Lasix 40 mg daily. BMET today.  - Continue Toprol XL 50 mg daily (recently decreased with prolonged PR interval).  - Increase hydralazine to 100 mg tid + continue Imdur 30 mg daily.  - She was unable to tolerate Jardiance due to frequent UTIs. - I think some of her LE edema is from venous insufficiency. Place compression hose. Given Rx today. 2. CKD: Stage III. BMET today.  3. PAF/atrial flutter: Rare short runs of atrial fibrillation on device interrogation.  - Continue amiodarone.  Normal TSH and LFTs 7/23. She will need regular eye exams.   - Continue Xarelto.  4. CAD: Nonobstructive on 4/18 cath.     - Continue pravastatin, good lipids in 7/23.    - No ASA given stable CAD and Xarelto use.  5. Bradycardia: She is s/p Medtronic PPM with His bundle pacing, primarily a-pacing with 17% His bundle pacing (down from 17%). His pacing is higher percentage now due to prolonged PR interval.  6. HTN: Elevated today.  Diurese and med changes as above. - Increase amlodipine to 10 mg daily.   Follow up in 3 months with Dr. Aundra Dubin.   Maricela Bo [facility] FNP-BC 10/03/2022

## 2022-10-03 NOTE — Patient Instructions (Addendum)
Thank you for coming in today  Labs were done today, if any labs are abnormal the clinic will call you No news is good news  INCREASE Amlodipine to 10 mg 2 tablets daily  INCREASE Hydralazine to 100 mg 2 tablets 3 times a day  INCREASE Lasix to 40 mg 2 times a day with Potassium 20 meq 2 times daily for 3 days only 10/03/2022 10/04/2022 10/05/2022 10/06/2022 Lasix 40 mg once daily and Potassium 20 meq once daily   Your physician recommends that you return for lab work in:  10 days BMET you have been given a prescription for this   You were given a prescription and places to get compression hose. Please wear your compression hose daily, place them on as soon as you get up in the morning and remove before you go to bed at night.  Your physician recommends that you schedule a follow-up appointment in:  3-4 months with Dr. Aundra Dubin     Do the following things EVERYDAY: Weigh yourself in the morning before breakfast. Write it down and keep it in a log. Take your medicines as prescribed Eat low salt foods--Limit salt (sodium) to 2000 mg per day.  Stay as active as you can everyday Limit all fluids for the day to less than 2 liters  At the [facility], you and your health needs are our priority. As part of our continuing mission to provide you with exceptional heart care, we have created designated Provider Care Teams. These Care Teams include your primary Cardiologist (physician) and Advanced Practice Providers (APPs- Physician Assistants and Nurse Practitioners) who all work together to provide you with the care you need, when you need it.   You may see any of the following providers on your designated Care Team at your next follow up: Dr Glori Bickers Dr Loralie Champagne Dr. Roxana Hires, NP Lyda Jester, [place] [facility] [place], [place] Forestine Na, NP Audry Riles, PharmD   Please be sure to bring in all your medications bottles to every  appointment.   If you have any questions or concerns before your next appointment please send [place] a message through [place] or call our office at [phone].    TO LEAVE A MESSAGE FOR THE NURSE SELECT OPTION 2, PLEASE LEAVE A MESSAGE INCLUDING: YOUR NAME DATE OF BIRTH CALL BACK NUMBER REASON FOR CALL**this is important as we prioritize the call backs  YOU WILL RECEIVE A CALL BACK THE SAME DAY AS LONG AS YOU CALL BEFORE 4:00 PM

## 2022-10-18 ENCOUNTER — Other Ambulatory Visit (HOSPITAL_COMMUNITY): Payer: Self-pay | Admitting: Family Medicine

## 2022-10-19 LAB — BASIC METABOLIC PANEL
BUN/Creatinine Ratio: 20 (ref 12–28)
BUN: 40 mg/dL — ABNORMAL HIGH (ref 8–27)
CO2: 21 mmol/L (ref 20–29)
Calcium: 9.7 mg/dL (ref 8.7–10.3)
Chloride: 103 mmol/L (ref 96–106)
Creatinine, Ser: 2.01 mg/dL — ABNORMAL HIGH (ref 0.57–1.00)
Glucose: 153 mg/dL — ABNORMAL HIGH (ref 70–99)
Potassium: 4.3 mmol/L (ref 3.5–5.2)
Sodium: 140 mmol/L (ref 134–144)
eGFR: 23 mL/min/{1.73_m2} — ABNORMAL LOW (ref >59)

## 2022-10-24 ENCOUNTER — Telehealth (HOSPITAL_COMMUNITY): Payer: Self-pay

## 2022-10-24 ENCOUNTER — Ambulatory Visit (INDEPENDENT_AMBULATORY_CARE_PROVIDER_SITE_OTHER): Payer: Medicare Other

## 2022-10-24 DIAGNOSIS — I495 Sick sinus syndrome: Secondary | ICD-10-CM | POA: Diagnosis not present

## 2022-10-24 DIAGNOSIS — I5032 Chronic diastolic (congestive) heart failure: Secondary | ICD-10-CM

## 2022-10-24 LAB — CUP PACEART REMOTE DEVICE CHECK
Battery Remaining Longevity: 68 mo
Battery Voltage: 2.96 V
Brady Statistic AP VP Percent: 25.69 %
Brady Statistic AP VS Percent: 42.97 %
Brady Statistic AS VP Percent: 16.92 %
Brady Statistic AS VS Percent: 14.42 %
Brady Statistic RA Percent Paced: 68.92 %
Brady Statistic RV Percent Paced: 43.39 %
Date Time Interrogation Session: 20231211001628
Implantable Lead Connection Status: 753985
Implantable Lead Connection Status: 753985
Implantable Lead Implant Date: 20190107
Implantable Lead Implant Date: 20190107
Implantable Lead Location: 753859
Implantable Lead Location: 753860
Implantable Lead Model: 3830
Implantable Lead Model: 5076
Implantable Pulse Generator Implant Date: 20190107
Lead Channel Impedance Value: 266 Ohm
Lead Channel Impedance Value: 323 Ohm
Lead Channel Impedance Value: 342 Ohm
Lead Channel Impedance Value: 399 Ohm
Lead Channel Pacing Threshold Amplitude: 0.875 V
Lead Channel Pacing Threshold Pulse Width: 0.4 ms
Lead Channel Sensing Intrinsic Amplitude: 0.75 mV
Lead Channel Sensing Intrinsic Amplitude: 0.75 mV
Lead Channel Sensing Intrinsic Amplitude: 2.375 mV
Lead Channel Sensing Intrinsic Amplitude: 2.375 mV
Lead Channel Setting Pacing Amplitude: 1.75 V
Lead Channel Setting Pacing Amplitude: 3 V
Lead Channel Setting Pacing Pulse Width: 1 ms
Lead Channel Setting Sensing Sensitivity: 0.9 mV
Zone Setting Status: 755011
Zone Setting Status: 755011

## 2022-10-24 NOTE — Telephone Encounter (Signed)
Patients daughter Ginger advised and verbalized understanding. Patient will have labs drawn in [place] at [facility] entered in epic.   Orders Placed This Encounter  Procedures   Basic metabolic panel    Standing Status:   Future    Standing Expiration Date:   10/25/2023    Order Specific Question:   Release to patient    Answer:   Immediate    Order Specific Question:   Release to patient    Answer:   Immediate [1]

## 2022-10-24 NOTE — Telephone Encounter (Signed)
-----   Message from Rafael Bihari, [place] sent at 10/21/2022  7:59 AM EST ----- Labs ok, kidney function mildly elevated after short increase in diuretics.  Repeat BMET in 2-3 weeks to follow. OK to have labs drawn at [facility]

## 2022-11-25 NOTE — Progress Notes (Signed)
Remote pacemaker transmission.   

## 2022-12-16 ENCOUNTER — Other Ambulatory Visit (HOSPITAL_COMMUNITY): Payer: Self-pay | Admitting: Cardiology

## 2023-01-23 ENCOUNTER — Ambulatory Visit: Payer: Medicare Other

## 2023-01-23 DIAGNOSIS — I495 Sick sinus syndrome: Secondary | ICD-10-CM

## 2023-01-24 LAB — CUP PACEART REMOTE DEVICE CHECK
Battery Remaining Longevity: 66 mo
Battery Voltage: 2.97 V
Brady Statistic AP VP Percent: 16.23 %
Brady Statistic AP VS Percent: 36.58 %
Brady Statistic AS VP Percent: 34.6 %
Brady Statistic AS VS Percent: 12.55 %
Brady Statistic RA Percent Paced: 27.19 %
Brady Statistic RV Percent Paced: 51.21 %
Date Time Interrogation Session: 20240312021441
Implantable Lead Connection Status: 753985
Implantable Lead Connection Status: 753985
Implantable Lead Implant Date: 20190107
Implantable Lead Implant Date: 20190107
Implantable Lead Location: 753859
Implantable Lead Location: 753860
Implantable Lead Model: 3830
Implantable Lead Model: 5076
Implantable Pulse Generator Implant Date: 20190107
Lead Channel Impedance Value: 266 Ohm
Lead Channel Impedance Value: 342 Ohm
Lead Channel Impedance Value: 361 Ohm
Lead Channel Impedance Value: 418 Ohm
Lead Channel Pacing Threshold Amplitude: 0.75 V
Lead Channel Pacing Threshold Pulse Width: 0.4 ms
Lead Channel Sensing Intrinsic Amplitude: 1 mV
Lead Channel Sensing Intrinsic Amplitude: 1 mV
Lead Channel Sensing Intrinsic Amplitude: 2.125 mV
Lead Channel Sensing Intrinsic Amplitude: 2.125 mV
Lead Channel Setting Pacing Amplitude: 1.5 V
Lead Channel Setting Pacing Amplitude: 3 V
Lead Channel Setting Pacing Pulse Width: 1 ms
Lead Channel Setting Sensing Sensitivity: 0.9 mV
Zone Setting Status: 755011
Zone Setting Status: 755011

## 2023-02-13 ENCOUNTER — Emergency Department (HOSPITAL_COMMUNITY)
Admission: EM | Admit: 2023-02-13 | Discharge: 2023-02-13 | Disposition: A | Discharge status: Home / Self Care | Payer: Medicare Other | Attending: Emergency Medicine | Admitting: Emergency Medicine

## 2023-02-13 ENCOUNTER — Emergency Department (HOSPITAL_COMMUNITY): Payer: Medicare Other

## 2023-02-13 ENCOUNTER — Other Ambulatory Visit: Payer: Self-pay

## 2023-02-13 ENCOUNTER — Encounter (HOSPITAL_COMMUNITY): Payer: Self-pay | Admitting: Pharmacy Technician

## 2023-02-13 DIAGNOSIS — Z7901 Long term (current) use of anticoagulants: Secondary | ICD-10-CM | POA: Diagnosis not present

## 2023-02-13 DIAGNOSIS — Y92009 Unspecified place in unspecified non-institutional (private) residence as the place of occurrence of the external cause: Secondary | ICD-10-CM | POA: Diagnosis not present

## 2023-02-13 DIAGNOSIS — M25512 Pain in left shoulder: Secondary | ICD-10-CM | POA: Diagnosis not present

## 2023-02-13 DIAGNOSIS — I6782 Cerebral ischemia: Secondary | ICD-10-CM | POA: Diagnosis not present

## 2023-02-13 DIAGNOSIS — W01190A Fall on same level from slipping, tripping and stumbling with subsequent striking against furniture, initial encounter: Secondary | ICD-10-CM | POA: Insufficient documentation

## 2023-02-13 DIAGNOSIS — S0990XA Unspecified injury of head, initial encounter: Secondary | ICD-10-CM | POA: Insufficient documentation

## 2023-02-13 DIAGNOSIS — M545 Low back pain, unspecified: Secondary | ICD-10-CM | POA: Insufficient documentation

## 2023-02-13 DIAGNOSIS — M546 Pain in thoracic spine: Secondary | ICD-10-CM | POA: Diagnosis not present

## 2023-02-13 DIAGNOSIS — T07XXXA Unspecified multiple injuries, initial encounter: Secondary | ICD-10-CM

## 2023-02-13 DIAGNOSIS — Z794 Long term (current) use of insulin: Secondary | ICD-10-CM | POA: Diagnosis not present

## 2023-02-13 MED ORDER — ACETAMINOPHEN 325 MG PO TABS
650.0000 mg | ORAL_TABLET | Freq: Once | ORAL | Status: AC
  Administered 2023-02-13: 650 mg via ORAL

## 2023-02-13 NOTE — ED Triage Notes (Signed)
Pt bib ems from home after mechanical fall. Pt hit her left shoulder on furniture before going to the ground. Denies hitting head (on xarelto). Pt with complaints of lower back pain and L shoulder pain. VSS with ems. GCS 15.

## 2023-02-13 NOTE — ED Notes (Signed)
Patient Alert and oriented to baseline. Stable and ambulatory to baseline. Patient verbalized understanding of the discharge instructions.  Patient belongings were taken by the patient.   

## 2023-02-13 NOTE — ED Provider Notes (Signed)
[place] EMERGENCY DEPARTMENT AT [facility] Provider Note   CSN: [id number] Arrival date & time: 02/13/23  1649     History  Chief Complaint  Patient presents with   Lytle Michaels    Shannon Lucas is a 89 y.o. female.  This is a 89 year old female presents after mechanical fall just prior to arrival.  Patient states she fell onto her left side.  No LOC.  Patient did have a fall yesterday and struck her head.  Patient does take Xarelto.  Denies any recent medical complaints.  Complains of mid and lower lumbar thoracic back pain.  No distal numbness or tingling.       Home Medications Prior to Admission medications   Medication Sig Start Date End Date Taking? Authorizing Provider  acetaminophen (TYLENOL) 500 MG tablet Take 1,000 mg by mouth every 6 (six) hours as needed for moderate pain or headache.    [provider]  amiodarone (PACERONE) 200 MG tablet TAKE 1 TABLET BY MOUTH DAIY 11/05/19   Bensimhon, Shaune Pascal, MD  amLODipine (NORVASC) 10 MG tablet Take 1 tablet (10 mg total) by mouth daily. 10/03/22 09/28/23  Rafael Bihari, FNP  benzonatate (TESSALON) 100 MG capsule Take 100 mg by mouth 3 (three) times daily as needed for cough.    [provider]  cephALEXin (KEFLEX) 250 MG capsule Take 250 mg by mouth 2 (two) times daily. 01/25/22   [provider]  ergocalciferol (VITAMIN D2) 50000 units capsule Take 50,000 Units by mouth every Friday. At night    [provider]  ferrous sulfate 325 (65 FE) MG tablet Take 1 tablet (325 mg total) by mouth 2 (two) times daily with a meal. 02/27/20   Mercy Riding, MD  furosemide (LASIX) 20 MG tablet Take 2 tablets (40 mg total) by mouth daily. 10/03/22   [place], Maricela Bo, FNP  hydrALAZINE (APRESOLINE) 50 MG tablet Take 2 tablets (100 mg total) by mouth 3 (three) times daily. TAKE 2 TABLETS BY MOUTH 3 TIMES DAILY 10/03/22   [place], [place], FNP  insulin glargine (LANTUS) 100 UNIT/ML injection Inject  50 Units into the skin daily.    [provider]  ipratropium (ATROVENT HFA) 17 MCG/ACT inhaler Inhale 2 puffs into the lungs every 6 (six) hours as needed for wheezing. 09/06/17   Larey Dresser, MD  isosorbide mononitrate (IMDUR) 30 MG 24 hr tablet Take 1 tablet (30 mg total) by mouth daily. 05/04/22   Rafael Bihari, FNP  levothyroxine (SYNTHROID) 50 MCG tablet Take 50 mcg by mouth daily. 01/29/20   [provider]  Melatonin 1 MG CAPS Take by mouth daily as needed.    [provider]  metoprolol succinate (TOPROL-XL) 50 MG 24 hr tablet Take 1 tablet (50 mg total) by mouth daily. TAKE 1 TABLET BY MOUTH DAILY IMMEDIATELYFOLLOWING A MEAL 08/22/22   Larey Dresser, MD  OLANZapine (ZYPREXA) 2.5 MG tablet Take 2.5 mg by mouth at bedtime.    [provider]  omeprazole (PRILOSEC) 20 MG capsule Take 20 mg by mouth daily.    [provider]  polyethylene glycol powder (MIRALAX) 17 GM/SCOOP powder Take 17 g by mouth 2 (two) times daily as needed for moderate constipation. 02/27/20   Mercy Riding, MD  potassium chloride SA (KLOR-CON M) 20 MEQ tablet Take 1 tablet (20 mEq total) by mouth daily. 10/03/22   [place], Maricela Bo, FNP  pravastatin (PRAVACHOL) 40 MG tablet Take 1 tablet by mouth  at bedtime. 12/16/22   Larey Dresser, MD  senna-docusate (SENOKOT-S) 8.6-50 MG tablet Take 1 tablet by mouth 2 (two) times daily as needed for moderate constipation. 02/27/20   Mercy Riding, MD  venlafaxine (EFFEXOR) 75 MG tablet Take 75 mg by mouth in the morning and at bedtime. 02/03/17   [provider]  XARELTO 15 MG TABS tablet TAKE 1 TABLET BY MOUTH DAILY WITH SUPPER 03/22/22   Larey Dresser, MD      Allergies    Patient has no known allergies.    Review of Systems   Review of Systems  All other systems reviewed and are negative.   Physical Exam Updated Vital Signs BP (!) 159/69   Pulse 74   Temp 98.7 F (37.1 C)   Resp 18   SpO2 98%  Physical  Exam Vitals and nursing note reviewed.  Constitutional:      General: She is not in acute distress.    Appearance: Normal appearance. She is well-developed. She is not toxic-appearing.  HENT:     Head: Normocephalic and atraumatic.  Eyes:     General: Lids are normal.     Conjunctiva/sclera: Conjunctivae normal.     Pupils: Pupils are equal, round, and reactive to light.  Neck:     Thyroid: No thyroid mass.     Trachea: No tracheal deviation.  Cardiovascular:     Rate and Rhythm: Normal rate and regular rhythm.     Heart sounds: Normal heart sounds. No murmur heard.    No gallop.  Pulmonary:     Effort: Pulmonary effort is normal. No respiratory distress.     Breath sounds: Normal breath sounds. No stridor. No decreased breath sounds, wheezing, rhonchi or rales.  Abdominal:     General: There is no distension.     Palpations: Abdomen is soft.     Tenderness: There is no abdominal tenderness. There is no rebound.  Musculoskeletal:        General: No tenderness. Normal range of motion.     Cervical back: Normal range of motion and neck supple.     Thoracic back: Bony tenderness present.     Lumbar back: Bony tenderness present.       Back:  Skin:    General: Skin is warm and dry.     Findings: No abrasion or rash.  Neurological:     General: No focal deficit present.     Mental Status: She is alert and oriented to person, place, and time. Mental status is at baseline.     GCS: GCS eye subscore is 4. GCS verbal subscore is 5. GCS motor subscore is 6.     Cranial Nerves: No cranial nerve deficit.     Sensory: No sensory deficit.     Motor: Motor function is intact.  Psychiatric:        Attention and Perception: Attention normal.        Speech: Speech normal.        Behavior: Behavior normal.     ED Results / Procedures / Treatments   Labs (all labs ordered are listed, but only abnormal results are displayed) Labs Reviewed - No data to  display  EKG None  Radiology No results found.  Procedures Procedures    Medications Ordered in ED Medications - No data to display  ED Course/ Medical Decision Making/ A&P  Medical Decision Making Amount and/or Complexity of Data Reviewed Radiology: ordered.  Patient had CT scan of her head and cervical spine which per my review interpretation were negative for acute findings.  Also had plain x-rays of her thoracic and lumbar spine which were also -2.  Her neurological exam is stable.  Will discharge home         Final Clinical Impression(s) / ED Diagnoses Final diagnoses:  None    Rx / DC Orders ED Discharge Orders     None         Lacretia Leigh, MD 02/13/23 [phone]

## 2023-02-28 NOTE — Progress Notes (Signed)
Remote pacemaker transmission.   

## 2023-03-19 ENCOUNTER — Other Ambulatory Visit (HOSPITAL_COMMUNITY): Payer: Self-pay | Admitting: Cardiology

## 2023-04-03 ENCOUNTER — Telehealth (HOSPITAL_COMMUNITY): Payer: Self-pay

## 2023-04-03 ENCOUNTER — Ambulatory Visit (HOSPITAL_COMMUNITY)
Admission: RE | Admit: 2023-04-03 | Discharge: 2023-04-03 | Disposition: A | Discharge status: Home / Self Care | Payer: Medicare Other | Source: Ambulatory Visit | Attending: Cardiology | Admitting: Cardiology

## 2023-04-03 ENCOUNTER — Encounter (HOSPITAL_COMMUNITY): Payer: Self-pay | Admitting: Cardiology

## 2023-04-03 VITALS — BP 110/70 | HR 82 | Wt 188.0 lb

## 2023-04-03 DIAGNOSIS — I4892 Unspecified atrial flutter: Secondary | ICD-10-CM | POA: Insufficient documentation

## 2023-04-03 DIAGNOSIS — Z87440 Personal history of urinary (tract) infections: Secondary | ICD-10-CM | POA: Insufficient documentation

## 2023-04-03 DIAGNOSIS — Z7901 Long term (current) use of anticoagulants: Secondary | ICD-10-CM | POA: Insufficient documentation

## 2023-04-03 DIAGNOSIS — T81718A Complication of other artery following a procedure, not elsewhere classified, initial encounter: Secondary | ICD-10-CM | POA: Diagnosis not present

## 2023-04-03 DIAGNOSIS — Z8673 Personal history of transient ischemic attack (TIA), and cerebral infarction without residual deficits: Secondary | ICD-10-CM | POA: Insufficient documentation

## 2023-04-03 DIAGNOSIS — I5042 Chronic combined systolic (congestive) and diastolic (congestive) heart failure: Secondary | ICD-10-CM | POA: Diagnosis not present

## 2023-04-03 DIAGNOSIS — R443 Hallucinations, unspecified: Secondary | ICD-10-CM | POA: Diagnosis not present

## 2023-04-03 DIAGNOSIS — I729 Aneurysm of unspecified site: Secondary | ICD-10-CM | POA: Diagnosis not present

## 2023-04-03 DIAGNOSIS — I428 Other cardiomyopathies: Secondary | ICD-10-CM | POA: Diagnosis not present

## 2023-04-03 DIAGNOSIS — E785 Hyperlipidemia, unspecified: Secondary | ICD-10-CM | POA: Insufficient documentation

## 2023-04-03 DIAGNOSIS — I2510 Atherosclerotic heart disease of native coronary artery without angina pectoris: Secondary | ICD-10-CM | POA: Diagnosis not present

## 2023-04-03 DIAGNOSIS — Z79899 Other long term (current) drug therapy: Secondary | ICD-10-CM | POA: Diagnosis not present

## 2023-04-03 DIAGNOSIS — I5032 Chronic diastolic (congestive) heart failure: Secondary | ICD-10-CM

## 2023-04-03 DIAGNOSIS — I5022 Chronic systolic (congestive) heart failure: Secondary | ICD-10-CM

## 2023-04-03 DIAGNOSIS — K219 Gastro-esophageal reflux disease without esophagitis: Secondary | ICD-10-CM | POA: Insufficient documentation

## 2023-04-03 DIAGNOSIS — E1122 Type 2 diabetes mellitus with diabetic chronic kidney disease: Secondary | ICD-10-CM | POA: Insufficient documentation

## 2023-04-03 DIAGNOSIS — I444 Left anterior fascicular block: Secondary | ICD-10-CM | POA: Insufficient documentation

## 2023-04-03 DIAGNOSIS — Z794 Long term (current) use of insulin: Secondary | ICD-10-CM | POA: Diagnosis not present

## 2023-04-03 DIAGNOSIS — I480 Paroxysmal atrial fibrillation: Secondary | ICD-10-CM | POA: Diagnosis not present

## 2023-04-03 DIAGNOSIS — I130 Hypertensive heart and chronic kidney disease with heart failure and stage 1 through stage 4 chronic kidney disease, or unspecified chronic kidney disease: Secondary | ICD-10-CM | POA: Insufficient documentation

## 2023-04-03 DIAGNOSIS — Z87891 Personal history of nicotine dependence: Secondary | ICD-10-CM | POA: Insufficient documentation

## 2023-04-03 DIAGNOSIS — N183 Chronic kidney disease, stage 3 unspecified: Secondary | ICD-10-CM | POA: Insufficient documentation

## 2023-04-03 LAB — TSH: TSH: 7.063 u[IU]/mL — ABNORMAL HIGH (ref 0.350–4.500)

## 2023-04-03 LAB — COMPREHENSIVE METABOLIC PANEL
ALT: 15 U/L (ref 0–44)
AST: 17 U/L (ref 15–41)
Albumin: 3.4 g/dL — ABNORMAL LOW (ref 3.5–5.0)
Alkaline Phosphatase: 106 U/L (ref 38–126)
Anion gap: 10 (ref 5–15)
BUN: 31 mg/dL — ABNORMAL HIGH (ref 8–23)
CO2: 23 mmol/L (ref 22–32)
Calcium: 9.2 mg/dL (ref 8.9–10.3)
Chloride: 107 mmol/L (ref 98–111)
Creatinine, Ser: 2.14 mg/dL — ABNORMAL HIGH (ref 0.44–1.00)
GFR, Estimated: 21 mL/min — ABNORMAL LOW (ref >60)
Glucose, Bld: 94 mg/dL (ref 70–99)
Potassium: 3.9 mmol/L (ref 3.5–5.1)
Sodium: 140 mmol/L (ref 135–145)
Total Bilirubin: 0.9 mg/dL (ref 0.3–1.2)
Total Protein: 6.3 g/dL — ABNORMAL LOW (ref 6.5–8.1)

## 2023-04-03 MED ORDER — TORSEMIDE 20 MG PO TABS
20.0000 mg | ORAL_TABLET | Freq: Every day | ORAL | 11 refills | Status: DC
Start: 2023-04-03 — End: 2023-07-17

## 2023-04-03 NOTE — Telephone Encounter (Signed)
Called and spoke to patient's daughter Ginger. Patient's med list has been updated to reflect the change. In addition, patient's labs has been ordered and her appointment has been scheduled.   Pt 's daughter aware, agreeable, and verbalized understanding

## 2023-04-03 NOTE — Patient Instructions (Signed)
There has been no changes to your medications.  Labs done today, your results will be available in MyChart, we will contact you for abnormal readings.  Your physician recommends that you schedule a follow-up appointment in: 6 months  If you have any questions or concerns before your next appointment please send [place] a message through [place] or call our office at [phone].    TO LEAVE A MESSAGE FOR THE NURSE SELECT OPTION 2, PLEASE LEAVE A MESSAGE INCLUDING: YOUR NAME DATE OF BIRTH CALL BACK NUMBER REASON FOR CALL**this is important as we prioritize the call backs  YOU WILL RECEIVE A CALL BACK THE SAME DAY AS LONG AS YOU CALL BEFORE 4:00 PM  At the [facility], you and your health needs are our priority. As part of our continuing mission to provide you with exceptional heart care, we have created designated Provider Care Teams. These Care Teams include your primary Cardiologist (physician) and Advanced Practice Providers (APPs- Physician Assistants and Nurse Practitioners) who all work together to provide you with the care you need, when you need it.   You may see any of the following providers on your designated Care Team at your next follow up: Dr Arvilla Meres Dr Marca Ancona Dr. Marcos Eke, NP Robbie Lis, [place] [facility] [place], [place] Brynda Peon, NP Karle Plumber, PharmD   Please be sure to bring in all your medications bottles to every appointment.    Thank you for choosing [facility]

## 2023-04-03 NOTE — Telephone Encounter (Signed)
-----   Message from Laurey Morale, MD sent at 04/03/2023  2:12 PM EDT ----- Decrease torsemide to 20 mg daily with elevated creatinine.  BMET in 10 days.

## 2023-04-04 NOTE — Progress Notes (Signed)
[facility] Note   Primary Care: Daphane Shepherd, [place] [facility] Cardiology: Dr. Shirlee Latch  HPI:  Shannon Lucas is a 89 y.o. female with PMH of Systolic CHF,  PAF on Xarelto, CKD stage III, DM II, GERD, HLD, CVA, anemia.   Admitted 4/17 -> 03/07/17 with A/C systolic CHF. L/RHC as below with normal coronaries and elevated filling pressures. Diuresed 5 lbs. Hospital course complicated by hematoma at radial cath site that improved overnight. Medications adjusted as tolerated. Discharge weight 205 lbs.    Pt seen in ED 03/08/17 for worsening of hematoma as above.  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 Dr. Arbie Cookey saw in consult and repaired under local anaesthesia and sedation.   Echo in 10/18 showed EF 35-40% with diffuse hypokinesis, PASP 50 mmHg.  Holter in 10/18 showed runs of atrial fibrillation including afib with aberrancy.  Bradycardia into the 30s was also noted.  Decision was made to place PPM => patient got Medtronic device with His bundle lead and is now His pacing.    Echo in 2/20 showed EF up to 60-65%, mild LVH, normal RV.   She was admitted in 4/21 with fall, rib fractures, sepsis due to UTI, and CHF exacerbation. She was in and out of atrial fibrillation in the hospital.  She was diuresed.   Echo in 6/21 with EF 60-65%, normal RV, mild AS.   She was admitted in 3/22 with delirium and hallucinations, ?related to sinusitis.   Echo in 7/22 showed EF 55-60%, mild LVH, normal RV, mild AS.    Patient returns for followup of diastolic CHF, PAF.  She is back in atrial fibrillation today but has not noticed it.  Not dyspneic walking around on flat ground with her walker.  No chest pain.  No orthopnea/PND.  Main issue remains hallucinations, now on olanzapine at night.   ECG (personally reviewed): atrial fibrillation, LAFB, poor RWP  Labs (04/06/17): K 5.0, Creatinine 2.04, BUN 43, LFTs normal Labs (6/18): K 3.9, creatinine 1.68 Labs (8/18): LDL 83, HDL 51, hgb 11.1, K 4.5, creatinine  1.48, LFTs normal, TSH normal.  Labs (1/19): K 4, creatinine 1.5, hgb 12.2, TSH normal, LFTs normal Labs (7/19): K 4.3, creatinine 1.78, LDL 105, TSH normal, LFTs normal Labs (1/20): K 3.9, creatinine 1.95 Labs (4/21): K 3.9, creatinine 1.86 Labs (5/21): LDL 64 Labs (6/21): K 4.1, creatinine 1.8, hgb 9.8 Labs (8/21): K 3.5, creatinine 1.75 Labs (11/21): LDL 66 Labs (3/22): K 4, creatinine 1.7 Labs (8/22): hgb 11.8, K 4.8, creatinine 1.79, LFTs normal, TSH normal Labs (7/23): K 4.8, creatinine 1.97, LDL 70, TGs 59, LFTs normal, TSH normal Labs (10/23): K 4.4, creatinine 1.79 Labs (5/24): K 3.9, creatinine 2.14, LFTs normal, TSH mildly elevated 7  Review of systems complete and found to be negative unless listed in HPI.    PMH 1. Chronic systolic CHF: Nonischemic cardiomyopathy.  - Echo 03/01/17 LVEF 25-30%, At least mild AS, Mild MR, Mod LAE, Mod RAE, Trivial PI, PA peak pressure 42 mm Hg. - LHC (4/18) with nonobstructive CAD.  - Echo (10/18): EF 35-40%, diffuse hypokinesis, mild LVH, mild MR, mild AS, PASP 50 mmHg.  - Medtronic PPM with His bundle lead placed.  - Echo (2/20): EF 60-65%, mild LVH, normal RV size and systolic function.  - Echo (6/21): EF 60-65%, mild LVH, RV normal, PASP 47 mmHg, mild AS mean gradient 14 mmHg.  - Echo (7/22): EF 55-60%, mild LVH, normal RV, mild AS. 2. Atrial fibrillation: Paroxysmal. On Xarelto  and amiodarone.  - Atypical atrial flutter noted 05/28/18.  3. CKD Stage III 4. DMII 5. GERD 6. HLD 7. Hx of CVA 8. Chronic anemia 9. CAD: LHC (4/18) with 70% ostial D1, 50% ostial D2, 40% ostial OM1.  10. Aortic stenosis: Mild on echo in 6/21.  Mild on 7/22 echo.  11. 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  12. Vascular dementia: Gets hallucinations.   Current Outpatient Medications  Medication Sig Dispense Refill   acetaminophen (TYLENOL) 500 MG tablet Take 1,000 mg by mouth every 6 (six) hours as needed for moderate pain or headache.      amiodarone (PACERONE) 200 MG tablet TAKE 1 TABLET BY MOUTH DAIY 30 tablet 3   benzonatate (TESSALON) 100 MG capsule Take 100 mg by mouth 3 (three) times daily as needed for cough.     ergocalciferol (VITAMIN D2) 50000 units capsule Take 50,000 Units by mouth every Friday. At night     Ferrous Sulfate 27 MG TABS Take 1 tablet by mouth daily.     hydrALAZINE (APRESOLINE) 50 MG tablet Take 50 mg by mouth 3 (three) times daily.     insulin glargine (LANTUS) 100 UNIT/ML injection Inject 50 Units into the skin daily.     ipratropium (ATROVENT HFA) 17 MCG/ACT inhaler Inhale 2 puffs into the lungs every 6 (six) hours as needed for wheezing. 1 Inhaler 12   isosorbide mononitrate (IMDUR) 30 MG 24 hr tablet Take 1 tablet (30 mg total) by mouth daily. 90 tablet 3   levothyroxine (SYNTHROID) 50 MCG tablet Take 50 mcg by mouth daily.     metoprolol succinate (TOPROL-XL) 50 MG 24 hr tablet Take 1 tablet (50 mg total) by mouth daily. TAKE 1 TABLET BY MOUTH DAILY IMMEDIATELYFOLLOWING A MEAL 90 tablet 3   omeprazole (PRILOSEC) 20 MG capsule Take 20 mg by mouth daily.     polyethylene glycol powder (MIRALAX) 17 GM/SCOOP powder Take 17 g by mouth 2 (two) times daily as needed for moderate constipation. 255 g 0   potassium chloride SA (KLOR-CON M) 20 MEQ tablet Take 1 tablet (20 mEq total) by mouth daily. 30 tablet 7   pravastatin (PRAVACHOL) 40 MG tablet Take 1 tablet by mouth at bedtime. 90 tablet 1   senna-docusate (SENOKOT-S) 8.6-50 MG tablet Take 1 tablet by mouth 2 (two) times daily as needed for moderate constipation.     venlafaxine (EFFEXOR) 75 MG tablet Take 75 mg by mouth in the morning and at bedtime.     XARELTO 15 MG TABS tablet TAKE 1 TABLET BY MOUTH DAILY WITH SUPPER 30 tablet 11   torsemide (DEMADEX) 20 MG tablet Take 1 tablet (20 mg total) by mouth daily. 30 tablet 11   No current facility-administered medications for this encounter.   No Known Allergies   Social History   Socioeconomic History    Marital status: Widowed    Spouse name: Not on file   Number of children: Not on file   Years of education: Not on file   Highest education level: Not on file  Occupational History   Not on file  Tobacco Use   Smoking status: Former   Smokeless tobacco: Never   Tobacco comments:    02/28/2017 "only smoked in the 1960s when we went out"  Vaping Use   Vaping Use: Never used  Substance and Sexual Activity   Alcohol use: No   Drug use: No   Sexual activity: Never  Other Topics Concern   Not  on file  Social History Narrative   Not on file   Social Determinants of Health   Financial Resource Strain: Not on file  Food Insecurity: Not on file  Transportation Needs: Not on file  Physical Activity: Not on file  Stress: Not on file  Social Connections: Not on file  Intimate Partner Violence: Not on file   Family history No family history of premature CAD or CHF.   BP 110/70   Pulse 82   Wt 85.3 kg (188 lb)   SpO2 95%   BMI 31.28 kg/m    Wt Readings from Last 3 Encounters:  04/03/23 85.3 kg (188 lb)  10/03/22 91.2 kg (201 lb)  08/22/22 89.8 kg (198 lb)    PHYSICAL EXAM: General: NAD Neck: No JVD, no thyromegaly or thyroid nodule.  Lungs: Clear to auscultation bilaterally with normal respiratory effort. CV: Nondisplaced PMI.  Heart irregular S1/S2, no S3/S4, no murmur.  No peripheral edema.  No carotid bruit.  Normal pedal pulses.  Abdomen: Soft, nontender, no hepatosplenomegaly, no distention.  Skin: Intact without lesions or rashes.  Neurologic: Alert and oriented x 3.  Psych: Normal affect. Extremities: No clubbing or cyanosis.  HEENT: Normal.   ASSESSMENT & PLAN: 1. Chronic systolic=>diastolic HF:  Echo with EF 25-30% 02/2017, repeat echo 10/18 showed EF 35-40%. Nonischemic cardiomyopathy. Medtronic device with His bundle lead was placed.  Most recent echo in 7/22 showed EF up to 55-60% with mild LVH and normal RV.  NYHA II-III currently. Weight is down.  She is  not volume overloaded on exam.  Though she is in atrial fibrillation, she is not symptomatically worse.  - Continue torsemide 20 mg daily, BMET done today and stable at 2.14.  - Continue Toprol XL 50 mg daily (recently decreased with prolonged PR interval).  - Continue hydralazine 50 mg tid + continue Imdur 30 mg daily.  - She was unable to tolerate Jardiance due to frequent UTIs. - We discussed repeating echo today, she and daughter do not want tests done unless absolutely negative so will hold off.  2. CKD: Stage III. BMET stable today.  3. PAF/atrial flutter: She is in atrial fibrillation today at controlled rate.  She does not notice it.  Device shows AF about 15% of the time.  Discussed with daughter, will not treat aggressively given lack of symptoms.   - She is only in AF some of the time, will continue amiodarone.  LFTs normal, TSH mildly elevated (follow). She will need regular eye exams.   - Continue Xarelto.  4. CAD: Nonobstructive on 4/18 cath.     - Continue pravastatin   - No ASA given stable CAD and Xarelto use.  5. Bradycardia: She is s/p Medtronic PPM with His bundle pacing, primarily a-pacing.  6. HTN: BP controlled.   Follow up in 4 months with APP   Marca Ancona  04/04/2023

## 2023-04-13 ENCOUNTER — Ambulatory Visit (HOSPITAL_COMMUNITY)
Admission: RE | Admit: 2023-04-13 | Discharge: 2023-04-13 | Disposition: A | Discharge status: Home / Self Care | Payer: Medicare Other | Source: Ambulatory Visit | Attending: Internal Medicine | Admitting: Internal Medicine

## 2023-04-13 DIAGNOSIS — I5022 Chronic systolic (congestive) heart failure: Secondary | ICD-10-CM | POA: Diagnosis present

## 2023-04-13 LAB — BASIC METABOLIC PANEL
Anion gap: 10 (ref 5–15)
BUN: 30 mg/dL — ABNORMAL HIGH (ref 8–23)
CO2: 25 mmol/L (ref 22–32)
Calcium: 9.2 mg/dL (ref 8.9–10.3)
Chloride: 106 mmol/L (ref 98–111)
Creatinine, Ser: 2.05 mg/dL — ABNORMAL HIGH (ref 0.44–1.00)
GFR, Estimated: 23 mL/min — ABNORMAL LOW (ref >60)
Glucose, Bld: 298 mg/dL — ABNORMAL HIGH (ref 70–99)
Potassium: 4.8 mmol/L (ref 3.5–5.1)
Sodium: 141 mmol/L (ref 135–145)

## 2023-04-24 ENCOUNTER — Ambulatory Visit (INDEPENDENT_AMBULATORY_CARE_PROVIDER_SITE_OTHER): Payer: Medicare Other

## 2023-04-24 DIAGNOSIS — I5022 Chronic systolic (congestive) heart failure: Secondary | ICD-10-CM

## 2023-04-24 LAB — CUP PACEART REMOTE DEVICE CHECK
Battery Remaining Longevity: 39 mo
Battery Voltage: 2.94 V
Brady Statistic AP VP Percent: 40.95 %
Brady Statistic AP VS Percent: 0.51 %
Brady Statistic AS VP Percent: 55.86 %
Brady Statistic AS VS Percent: 2.65 %
Brady Statistic RA Percent Paced: 30.74 %
Brady Statistic RV Percent Paced: 80.72 %
Date Time Interrogation Session: 20240610005102
Implantable Lead Connection Status: 753985
Implantable Lead Connection Status: 753985
Implantable Lead Implant Date: 20190107
Implantable Lead Implant Date: 20190107
Implantable Lead Location: 753859
Implantable Lead Location: 753860
Implantable Lead Model: 3830
Implantable Lead Model: 5076
Implantable Pulse Generator Implant Date: 20190107
Lead Channel Impedance Value: 266 Ohm
Lead Channel Impedance Value: 342 Ohm
Lead Channel Impedance Value: 342 Ohm
Lead Channel Impedance Value: 399 Ohm
Lead Channel Pacing Threshold Amplitude: 0.75 V
Lead Channel Pacing Threshold Pulse Width: 0.4 ms
Lead Channel Sensing Intrinsic Amplitude: 0.75 mV
Lead Channel Sensing Intrinsic Amplitude: 0.75 mV
Lead Channel Sensing Intrinsic Amplitude: 2.125 mV
Lead Channel Sensing Intrinsic Amplitude: 2.125 mV
Lead Channel Setting Pacing Amplitude: 1.5 V
Lead Channel Setting Pacing Amplitude: 3 V
Lead Channel Setting Pacing Pulse Width: 1 ms
Lead Channel Setting Sensing Sensitivity: 0.9 mV
Zone Setting Status: 755011
Zone Setting Status: 755011

## 2023-05-03 ENCOUNTER — Other Ambulatory Visit (HOSPITAL_COMMUNITY): Payer: Self-pay

## 2023-05-03 MED ORDER — ISOSORBIDE MONONITRATE ER 30 MG PO TB24: 30.0000 mg | ORAL_TABLET | Freq: Every day | ORAL | 2 refills | Status: DC

## 2023-05-15 NOTE — Progress Notes (Signed)
Remote pacemaker transmission.   

## 2023-05-16 ENCOUNTER — Telehealth: Payer: Self-pay | Admitting: Cardiology

## 2023-05-16 NOTE — Telephone Encounter (Signed)
See PPM transmission report

## 2023-05-16 NOTE — Telephone Encounter (Signed)
Daughter is returning call. Please advise  

## 2023-05-29 ENCOUNTER — Encounter (HOSPITAL_COMMUNITY): Payer: Self-pay | Admitting: Internal Medicine

## 2023-05-29 ENCOUNTER — Ambulatory Visit (HOSPITAL_COMMUNITY)
Admission: RE | Admit: 2023-05-29 | Discharge: 2023-05-29 | Disposition: A | Discharge status: Home / Self Care | Payer: Medicare Other | Source: Ambulatory Visit | Attending: Internal Medicine | Admitting: Internal Medicine

## 2023-05-29 VITALS — BP 116/76 | HR 74 | Ht 65.0 in | Wt 189.0 lb

## 2023-05-29 DIAGNOSIS — Z79899 Other long term (current) drug therapy: Secondary | ICD-10-CM | POA: Insufficient documentation

## 2023-05-29 DIAGNOSIS — D6869 Other thrombophilia: Secondary | ICD-10-CM | POA: Diagnosis not present

## 2023-05-29 DIAGNOSIS — Z8673 Personal history of transient ischemic attack (TIA), and cerebral infarction without residual deficits: Secondary | ICD-10-CM | POA: Diagnosis not present

## 2023-05-29 DIAGNOSIS — E1122 Type 2 diabetes mellitus with diabetic chronic kidney disease: Secondary | ICD-10-CM | POA: Diagnosis not present

## 2023-05-29 DIAGNOSIS — Z7901 Long term (current) use of anticoagulants: Secondary | ICD-10-CM | POA: Diagnosis not present

## 2023-05-29 DIAGNOSIS — Z950 Presence of cardiac pacemaker: Secondary | ICD-10-CM | POA: Diagnosis not present

## 2023-05-29 DIAGNOSIS — N183 Chronic kidney disease, stage 3 unspecified: Secondary | ICD-10-CM | POA: Diagnosis not present

## 2023-05-29 DIAGNOSIS — I454 Nonspecific intraventricular block: Secondary | ICD-10-CM | POA: Insufficient documentation

## 2023-05-29 DIAGNOSIS — I480 Paroxysmal atrial fibrillation: Secondary | ICD-10-CM | POA: Diagnosis not present

## 2023-05-29 DIAGNOSIS — Z794 Long term (current) use of insulin: Secondary | ICD-10-CM | POA: Insufficient documentation

## 2023-05-29 DIAGNOSIS — I5020 Unspecified systolic (congestive) heart failure: Secondary | ICD-10-CM | POA: Insufficient documentation

## 2023-05-29 DIAGNOSIS — K219 Gastro-esophageal reflux disease without esophagitis: Secondary | ICD-10-CM | POA: Diagnosis not present

## 2023-05-29 MED ORDER — AMIODARONE HCL 200 MG PO TABS: ORAL_TABLET | ORAL | 0 refills | Status: DC

## 2023-05-29 NOTE — Patient Instructions (Signed)
Increase amiodarone to 200mg  twice a day for 21 days (8/5) then reduce back to once a day

## 2023-05-29 NOTE — Progress Notes (Signed)
Primary Care Physician: Shellia Cleverly, PA Primary Cardiologist: Marca Ancona, MD Electrophysiologist: Will Jorja Loa, MD  {Click to update primary MD,subspecialty MD or APP then REFRESH:1}   Referring Physician: Dr. Elberta Fortis  {Removed [facility], PMH, PSH, ALLERGY, CMED, and SOC :1}   Shannon Lucas is a 89 y.o. female with a history of systolic CHF, nonobstructive CAD, CKD stage III, T2DM, GERD, tachy/brady syndrome s/p pacemaker implantation in 11/2017, HLD, CVA, anemia, and paroxysmal atrial fibrillation who presents for consultation in the [facility]. Recent device interrogation 04/24/23 showed increased Afib burden since April 2024 with average ventricular rates less than 100 bpm. Patient is on Xarelto 15 mg daily for a CHADS2VASC score of 8.  On evaluation today, she is currently in ***. She feels SOB and tired when in Afib. Daughter notes that she sleeps a lot more compared to previous. She is on Xarelto 15 mg daily and has not missed any doses.   Today, she denies symptoms of palpitations, chest pain, orthopnea, PND, lower extremity edema, dizziness, presyncope, syncope, snoring, daytime somnolence, bleeding, or neurologic sequela. The patient is tolerating medications without difficulties and is otherwise without complaint today.    she has a BMI of Body mass index is 31.45 kg/m.Marland Kitchen Filed Weights   05/29/23 1409  Weight: 85.7 kg    Current Outpatient Medications  Medication Sig Dispense Refill   acetaminophen (TYLENOL) 500 MG tablet Take 1,000 mg by mouth every 6 (six) hours as needed for moderate pain or headache.     benzonatate (TESSALON) 100 MG capsule Take 100 mg by mouth 3 (three) times daily as needed for cough.     ergocalciferol (VITAMIN D2) 50000 units capsule Take 50,000 Units by mouth every Friday. At night     Ferrous Sulfate 27 MG TABS Take 1 tablet by mouth daily.     hydrALAZINE (APRESOLINE) 50 MG tablet Take 50 mg by mouth 3 (three)  times daily.     insulin glargine (LANTUS) 100 UNIT/ML injection Inject 50 Units into the skin daily.     ipratropium (ATROVENT HFA) 17 MCG/ACT inhaler Inhale 2 puffs into the lungs every 6 (six) hours as needed for wheezing. 1 Inhaler 12   isosorbide mononitrate (IMDUR) 30 MG 24 hr tablet Take 1 tablet (30 mg total) by mouth daily. 90 tablet 2   levothyroxine (SYNTHROID) 50 MCG tablet Take 50 mcg by mouth daily.     metoprolol succinate (TOPROL-XL) 50 MG 24 hr tablet Take 1 tablet (50 mg total) by mouth daily. TAKE 1 TABLET BY MOUTH DAILY IMMEDIATELYFOLLOWING A MEAL 90 tablet 3   omeprazole (PRILOSEC) 20 MG capsule Take 20 mg by mouth daily.     polyethylene glycol powder (MIRALAX) 17 GM/SCOOP powder Take 17 g by mouth 2 (two) times daily as needed for moderate constipation. 255 g 0   potassium chloride SA (KLOR-CON M) 20 MEQ tablet Take 1 tablet (20 mEq total) by mouth daily. 30 tablet 7   pravastatin (PRAVACHOL) 40 MG tablet Take 1 tablet by mouth at bedtime. 90 tablet 1   senna-docusate (SENOKOT-S) 8.6-50 MG tablet Take 1 tablet by mouth 2 (two) times daily as needed for moderate constipation.     torsemide (DEMADEX) 20 MG tablet Take 1 tablet (20 mg total) by mouth daily. 30 tablet 11   venlafaxine (EFFEXOR) 75 MG tablet Take 75 mg by mouth in the morning and at bedtime.     XARELTO 15 MG TABS tablet TAKE 1  TABLET BY MOUTH DAILY WITH SUPPER 30 tablet 11   amiodarone (PACERONE) 200 MG tablet Take 1 tablet by mouth twice a day for 21 days then reduce back to once a day 60 tablet 0   No current facility-administered medications for this encounter.    Atrial Fibrillation Management history:  Previous antiarrhythmic drugs: amiodarone Previous cardioversions:  Previous ablations: None Anticoagulation history: Xarelto 15 mg    ROS- All systems are reviewed and negative except as per the HPI above.  Physical Exam: BP 116/76   Pulse 74   Ht 5\' 5"  (1.651 m)   Wt 85.7 kg   BMI 31.45 kg/m    GEN: Well nourished, well developed in no acute distress NECK: No JVD; No carotid bruits CARDIAC: {EPRHYTHM:[id number]}, no murmurs, rubs, gallops RESPIRATORY:  Clear to auscultation without rales, wheezing or rhonchi  ABDOMEN: Soft, non-tender, non-distended EXTREMITIES:  No edema; No deformity   EKG today demonstrates  Vent. rate 74 BPM PR interval * ms QRS duration 148 ms QT/QTcB 492/546 ms P-R-T axes * 191 31 Wide QRS rhythm with frequent ventricular-paced complexes Right superior axis deviation Non-specific intra-ventricular conduction block Abnormal ECG When compared with ECG of 03-Apr-2023 12:22, PREVIOUS ECG IS PRESENT  Echo 06/01/21 demonstrated  1. Partial fusion of left and noncoronary cusps with mild AS (mean  gradient 13 mmHg).   2. Left ventricular ejection fraction, by estimation, is 55 to 60%. The  left ventricle has normal function. The left ventricle has no regional  wall motion abnormalities. There is mild left ventricular hypertrophy.  Left ventricular diastolic parameters  are consistent with Grade II diastolic dysfunction (pseudonormalization).  Elevated left atrial pressure.   3. Right ventricular systolic function is normal. The right ventricular  size is normal.   4. Left atrial size was moderately dilated.   5. The mitral valve is normal in structure. Trivial mitral valve  regurgitation. No evidence of mitral stenosis.   6. The aortic valve is normal in structure. Aortic valve regurgitation is  not visualized. Mild aortic valve stenosis.   7. The inferior vena cava is normal in size with greater than 50%  respiratory variability, suggesting right atrial pressure of 3 mmHg.    ASSESSMENT & PLAN CHA2DS2-VASc Score = 8  The patient's score is based upon: CHF History: 1 HTN History: 0 Diabetes History: 1 Stroke History: 2 Vascular Disease History: 1 Age Score: 2 Gender Score: 1   {Confirm score is correct.  If not, click here to update score.   REFRESH note.  :1}    ASSESSMENT AND PLAN: Paroxysmal Atrial Fibrillation (ICD10:  I48.0) The patient's CHA2DS2-VASc score is 8, indicating a 10.8% annual risk of stroke.    She has had increasing Afib burden since late April; patient notes feeling more SOB and tired when in Afib. After discussion, we will attempt reload amiodarone 200 mg BID and obtain repeat ECG in 2 weeks. We can schedule DCCV if indicated.   Secondary Hypercoagulable State (ICD10:  D68.69){Click to add to Prob List or Visit Dx  :[id number]} The patient is at significant risk for stroke/thromboembolism based upon her CHA2DS2-VASc Score of 8.  Continue Rivaroxaban (Xarelto).  No missed doses.    Repeat ECG 2 weeks.    Lake Bells, PA-C  Afib Clinic [facility] [address] [place], [place] [address] [phone]

## 2023-05-29 NOTE — H&P (View-Only) (Signed)
Primary Care Physician: Shellia Cleverly, [place] Primary Cardiologist: Marca Ancona, MD Electrophysiologist: Will Jorja Loa, MD     Referring Physician: Dr. Jon Billings Style is a 89 y.o. female with a history of systolic CHF, nonobstructive CAD, CKD stage III, T2DM, GERD, tachy/brady syndrome s/p pacemaker implantation in 11/2017, HLD, CVA, anemia, and paroxysmal atrial fibrillation who presents for consultation in the [facility]. Recent device interrogation 04/24/23 showed increased Afib burden since April 2024 with average ventricular rates less than 100 bpm. Patient is on Xarelto 15 mg daily for a CHADS2VASC score of 8.  On evaluation today, she is currently in rate controlled Afib. She feels SOB and tired when in Afib. Daughter notes that she sleeps a lot more compared to previous. She is on Xarelto 15 mg daily and has not missed any doses.   Today, she denies symptoms of palpitations, chest pain, orthopnea, PND, lower extremity edema, dizziness, presyncope, syncope, snoring, daytime somnolence, bleeding, or neurologic sequela. The patient is tolerating medications without difficulties and is otherwise without complaint today.    she has a BMI of Body mass index is 31.45 kg/m.Marland Kitchen Filed Weights   05/29/23 1409  Weight: 85.7 kg    Current Outpatient Medications  Medication Sig Dispense Refill   acetaminophen (TYLENOL) 500 MG tablet Take 1,000 mg by mouth every 6 (six) hours as needed for moderate pain or headache.     benzonatate (TESSALON) 100 MG capsule Take 100 mg by mouth 3 (three) times daily as needed for cough.     ergocalciferol (VITAMIN D2) 50000 units capsule Take 50,000 Units by mouth every Friday. At night     Ferrous Sulfate 27 MG TABS Take 1 tablet by mouth daily.     hydrALAZINE (APRESOLINE) 50 MG tablet Take 50 mg by mouth 3 (three) times daily.     insulin glargine (LANTUS) 100 UNIT/ML injection Inject 50 Units into the skin daily.      ipratropium (ATROVENT HFA) 17 MCG/ACT inhaler Inhale 2 puffs into the lungs every 6 (six) hours as needed for wheezing. 1 Inhaler 12   isosorbide mononitrate (IMDUR) 30 MG 24 hr tablet Take 1 tablet (30 mg total) by mouth daily. 90 tablet 2   levothyroxine (SYNTHROID) 50 MCG tablet Take 50 mcg by mouth daily.     metoprolol succinate (TOPROL-XL) 50 MG 24 hr tablet Take 1 tablet (50 mg total) by mouth daily. TAKE 1 TABLET BY MOUTH DAILY IMMEDIATELYFOLLOWING A MEAL 90 tablet 3   omeprazole (PRILOSEC) 20 MG capsule Take 20 mg by mouth daily.     polyethylene glycol powder (MIRALAX) 17 GM/SCOOP powder Take 17 g by mouth 2 (two) times daily as needed for moderate constipation. 255 g 0   potassium chloride SA (KLOR-CON M) 20 MEQ tablet Take 1 tablet (20 mEq total) by mouth daily. 30 tablet 7   pravastatin (PRAVACHOL) 40 MG tablet Take 1 tablet by mouth at bedtime. 90 tablet 1   senna-docusate (SENOKOT-S) 8.6-50 MG tablet Take 1 tablet by mouth 2 (two) times daily as needed for moderate constipation.     torsemide (DEMADEX) 20 MG tablet Take 1 tablet (20 mg total) by mouth daily. 30 tablet 11   venlafaxine (EFFEXOR) 75 MG tablet Take 75 mg by mouth in the morning and at bedtime.     XARELTO 15 MG TABS tablet TAKE 1 TABLET BY MOUTH DAILY WITH SUPPER 30 tablet 11   amiodarone (PACERONE) 200 MG  tablet Take 1 tablet by mouth twice a day for 21 days then reduce back to once a day 60 tablet 0   No current facility-administered medications for this encounter.    Atrial Fibrillation Management history:  Previous antiarrhythmic drugs: amiodarone Previous cardioversions:  Previous ablations: None Anticoagulation history: Xarelto 15 mg    ROS- All systems are reviewed and negative except as per the HPI above.  Physical Exam: BP 116/76   Pulse 74   Ht 5\' 5"  (1.651 m)   Wt 85.7 kg   BMI 31.45 kg/m   GEN: Well nourished, well developed in no acute distress NECK: No JVD; No carotid bruits CARDIAC:  Irregularly irregular rate and rhythm, no murmurs, rubs, gallops RESPIRATORY:  Clear to auscultation without rales, wheezing or rhonchi  ABDOMEN: Soft, non-tender, non-distended EXTREMITIES:  No edema; No deformity   EKG today demonstrates  Vent. rate 74 BPM PR interval * ms QRS duration 148 ms QT/QTcB 492/546 ms P-R-T axes * 191 31 atrial fib with a controlled VR and occaisional PVC's Right superior axis deviation Non-specific intra-ventricular conduction block Abnormal ECG When compared with ECG of 03-Apr-2023 12:22, No significant change since last tracing paced events appear to be psuedofusion Confirmed by Lewayne Bunting [phone]) on 05/29/2023 8:29:22 PM  Echo 06/01/21 demonstrated  1. Partial fusion of left and noncoronary cusps with mild AS (mean  gradient 13 mmHg).   2. Left ventricular ejection fraction, by estimation, is 55 to 60%. The  left ventricle has normal function. The left ventricle has no regional  wall motion abnormalities. There is mild left ventricular hypertrophy.  Left ventricular diastolic parameters  are consistent with Grade II diastolic dysfunction (pseudonormalization).  Elevated left atrial pressure.   3. Right ventricular systolic function is normal. The right ventricular  size is normal.   4. Left atrial size was moderately dilated.   5. The mitral valve is normal in structure. Trivial mitral valve  regurgitation. No evidence of mitral stenosis.   6. The aortic valve is normal in structure. Aortic valve regurgitation is  not visualized. Mild aortic valve stenosis.   7. The inferior vena cava is normal in size with greater than 50%  respiratory variability, suggesting right atrial pressure of 3 mmHg.    ASSESSMENT & PLAN CHA2DS2-VASc Score = 8  The patient's score is based upon: CHF History: 1 HTN History: 0 Diabetes History: 1 Stroke History: 2 Vascular Disease History: 1 Age Score: 2 Gender Score: 1       ASSESSMENT AND  PLAN: Paroxysmal Atrial Fibrillation (ICD10:  I48.0) The patient's CHA2DS2-VASc score is 8, indicating a 10.8% annual risk of stroke.    She appears to be in Afib today that is rate controlled.  She has had increasing Afib burden since late April; patient notes feeling more SOB and tired when in Afib. After discussion with EP, we will attempt reload amiodarone 200 mg BID and obtain repeat ECG in 2 weeks. We can schedule DCCV if indicated.   We can potentially consider Tikosyn if amiodarone is unsuccessful in maintaining SR. However, she would only be eligible for the lowest dose of 125 mcg BID.   Secondary Hypercoagulable State (ICD10:  D68.69) The patient is at significant risk for stroke/thromboembolism based upon her CHA2DS2-VASc Score of 8.  Continue Rivaroxaban (Xarelto).  No missed doses.    Repeat ECG 2 weeks.    Lake Bells, PA-C  Afib Clinic [facility] [address] [place], [place] [address] [phone]

## 2023-05-30 DIAGNOSIS — I4819 Other persistent atrial fibrillation: Secondary | ICD-10-CM | POA: Insufficient documentation

## 2023-05-30 DIAGNOSIS — D6869 Other thrombophilia: Secondary | ICD-10-CM | POA: Insufficient documentation

## 2023-06-14 ENCOUNTER — Ambulatory Visit (HOSPITAL_COMMUNITY)
Admission: RE | Admit: 2023-06-14 | Discharge: 2023-06-14 | Disposition: A | Discharge status: Home / Self Care | Payer: Medicare Other | Source: Ambulatory Visit | Attending: Internal Medicine | Admitting: Internal Medicine

## 2023-06-14 ENCOUNTER — Other Ambulatory Visit (HOSPITAL_COMMUNITY): Payer: Self-pay | Admitting: Internal Medicine

## 2023-06-14 DIAGNOSIS — I480 Paroxysmal atrial fibrillation: Secondary | ICD-10-CM | POA: Insufficient documentation

## 2023-06-14 NOTE — Progress Notes (Signed)
Patient is here today for repeat ECG after amiodarone reload of 200 mg BID.  ECG today shows Vent. rate 116 BPM PR interval * ms QRS duration 184 ms QT/QTcB 354/492 ms P-R-T axes 17 156 235 Ventricular-paced rhythm Abnormal ECG When compared with ECG of 29-May-2023 14:20, PREVIOUS ECG IS PRESENT  After discussion with Dr. Elberta Fortis, appears to be likely atypical atrial flutter.   Pt will be scheduled for DCCV. No missed doses of anticoagulant. Labs obtained yesterday outside facility.  Informed Consent   Shared Decision Making/Informed Consent The risks (stroke, cardiac arrhythmias rarely resulting in the need for a temporary or permanent pacemaker, skin irritation or burns and complications associated with conscious sedation including aspiration, arrhythmia, respiratory failure and death), benefits (restoration of normal sinus rhythm) and alternatives of a direct current cardioversion were explained in detail to Ms. Freida Busman and she agrees to proceed.    F/u in 2 weeks.

## 2023-06-14 NOTE — Patient Instructions (Addendum)
Cardioversion scheduled for: August. 7th   - Arrive at the [facility] and go to admitting at 10:30am   - Do not eat or drink anything after midnight the night prior to your procedure.   - Take all your morning medication (except diabetic medications) with a sip of water prior to arrival.  - You will not be able to drive home after your procedure.    - Do NOT miss any doses of your blood thinner - if you should miss a dose please notify our office immediately.   - If you feel as if you go back into normal rhythm prior to scheduled cardioversion, please notify our office immediately.   If your procedure is canceled in the cardioversion suite you will be charged a cancellation fee.

## 2023-06-14 NOTE — H&P (View-Only) (Signed)
Patient is here today for repeat ECG after amiodarone reload of 200 mg BID.  ECG today shows Vent. rate 116 BPM PR interval * ms QRS duration 184 ms QT/QTcB 354/492 ms P-R-T axes 17 156 235 Ventricular-paced rhythm Abnormal ECG When compared with ECG of 29-May-2023 14:20, PREVIOUS ECG IS PRESENT  After discussion with Dr. Elberta Fortis, appears to be likely atypical atrial flutter.   Pt will be scheduled for DCCV. No missed doses of anticoagulant. Labs obtained yesterday outside facility.  Informed Consent   Shared Decision Making/Informed Consent The risks (stroke, cardiac arrhythmias rarely resulting in the need for a temporary or permanent pacemaker, skin irritation or burns and complications associated with conscious sedation including aspiration, arrhythmia, respiratory failure and death), benefits (restoration of normal sinus rhythm) and alternatives of a direct current cardioversion were explained in detail to Shannon Lucas and she agrees to proceed.    F/u in 2 weeks.

## 2023-06-15 ENCOUNTER — Other Ambulatory Visit (HOSPITAL_COMMUNITY): Payer: Self-pay | Admitting: Cardiology

## 2023-06-20 NOTE — Progress Notes (Signed)
Spoke with patient, pt asked this RN to call daughter, this RN did with no answer, phone message left for daughter, Ginger,  Procedure scheduled for 1200, Please arrive at the hospital at 1045, NPO after midnight on           Tuesday, May take meds with sips of water in the AM, please have transportation for home post procedure, and someone to stay with pt for approximately 24 hours after  Pt states she is unsure if she has been taking Xarelto regularly, she states daughter takes care of her medications and to call her, This RN called and left message for daughter with a return phone number [phone]) with any questions or if pt has known missed any doses of Xarelto

## 2023-06-21 ENCOUNTER — Ambulatory Visit (HOSPITAL_COMMUNITY): Payer: Medicare Other | Admitting: Anesthesiology

## 2023-06-21 ENCOUNTER — Ambulatory Visit (HOSPITAL_BASED_OUTPATIENT_CLINIC_OR_DEPARTMENT_OTHER): Payer: Medicare Other | Admitting: Anesthesiology

## 2023-06-21 ENCOUNTER — Encounter (HOSPITAL_COMMUNITY): Payer: Self-pay | Admitting: Cardiology

## 2023-06-21 ENCOUNTER — Other Ambulatory Visit: Payer: Self-pay

## 2023-06-21 ENCOUNTER — Ambulatory Visit (HOSPITAL_COMMUNITY)
Admission: RE | Admit: 2023-06-21 | Discharge: 2023-06-21 | Disposition: A | Discharge status: Home / Self Care | Payer: Medicare Other | Attending: Cardiology | Admitting: Cardiology

## 2023-06-21 ENCOUNTER — Encounter (HOSPITAL_COMMUNITY)
Admission: RE | Disposition: A | Discharge status: Home / Self Care | Payer: Self-pay | Source: Home / Self Care | Attending: Cardiology

## 2023-06-21 DIAGNOSIS — I480 Paroxysmal atrial fibrillation: Secondary | ICD-10-CM | POA: Diagnosis present

## 2023-06-21 DIAGNOSIS — D6869 Other thrombophilia: Secondary | ICD-10-CM | POA: Diagnosis not present

## 2023-06-21 DIAGNOSIS — I495 Sick sinus syndrome: Secondary | ICD-10-CM | POA: Diagnosis not present

## 2023-06-21 DIAGNOSIS — N183 Chronic kidney disease, stage 3 unspecified: Secondary | ICD-10-CM | POA: Diagnosis not present

## 2023-06-21 DIAGNOSIS — Z7901 Long term (current) use of anticoagulants: Secondary | ICD-10-CM | POA: Insufficient documentation

## 2023-06-21 DIAGNOSIS — D649 Anemia, unspecified: Secondary | ICD-10-CM | POA: Insufficient documentation

## 2023-06-21 DIAGNOSIS — I4891 Unspecified atrial fibrillation: Secondary | ICD-10-CM

## 2023-06-21 DIAGNOSIS — I5022 Chronic systolic (congestive) heart failure: Secondary | ICD-10-CM | POA: Insufficient documentation

## 2023-06-21 DIAGNOSIS — I2510 Atherosclerotic heart disease of native coronary artery without angina pectoris: Secondary | ICD-10-CM | POA: Insufficient documentation

## 2023-06-21 DIAGNOSIS — K219 Gastro-esophageal reflux disease without esophagitis: Secondary | ICD-10-CM | POA: Insufficient documentation

## 2023-06-21 DIAGNOSIS — E1122 Type 2 diabetes mellitus with diabetic chronic kidney disease: Secondary | ICD-10-CM | POA: Insufficient documentation

## 2023-06-21 DIAGNOSIS — I4892 Unspecified atrial flutter: Secondary | ICD-10-CM | POA: Diagnosis not present

## 2023-06-21 DIAGNOSIS — I130 Hypertensive heart and chronic kidney disease with heart failure and stage 1 through stage 4 chronic kidney disease, or unspecified chronic kidney disease: Secondary | ICD-10-CM | POA: Diagnosis not present

## 2023-06-21 DIAGNOSIS — Z950 Presence of cardiac pacemaker: Secondary | ICD-10-CM | POA: Diagnosis not present

## 2023-06-21 DIAGNOSIS — E785 Hyperlipidemia, unspecified: Secondary | ICD-10-CM | POA: Insufficient documentation

## 2023-06-21 DIAGNOSIS — Z794 Long term (current) use of insulin: Secondary | ICD-10-CM | POA: Diagnosis not present

## 2023-06-21 DIAGNOSIS — I5023 Acute on chronic systolic (congestive) heart failure: Secondary | ICD-10-CM

## 2023-06-21 DIAGNOSIS — Z8673 Personal history of transient ischemic attack (TIA), and cerebral infarction without residual deficits: Secondary | ICD-10-CM | POA: Insufficient documentation

## 2023-06-21 DIAGNOSIS — Z87891 Personal history of nicotine dependence: Secondary | ICD-10-CM

## 2023-06-21 HISTORY — PX: CARDIOVERSION: SHX1299

## 2023-06-21 LAB — GLUCOSE, CAPILLARY
Glucose-Capillary: 112 mg/dL — ABNORMAL HIGH (ref 70–99)
Glucose-Capillary: 49 mg/dL — ABNORMAL LOW (ref 70–99)
Glucose-Capillary: 147 mg/dL — ABNORMAL HIGH (ref 70–99)

## 2023-06-21 SURGERY — CARDIOVERSION
Anesthesia: General

## 2023-06-21 MED ORDER — DEXTROSE 50 % IV SOLN
INTRAVENOUS | Status: AC
  Administered 2023-06-21: 50 mL via INTRAVENOUS
  Filled 2023-06-21: qty 50

## 2023-06-21 MED ORDER — PROPOFOL 10 MG/ML IV BOLUS
INTRAVENOUS | Status: DC | PRN
Start: 2023-06-21 — End: 2023-06-21
  Administered 2023-06-21: 60 mg via INTRAVENOUS

## 2023-06-21 MED ORDER — SODIUM CHLORIDE 0.9 % IV SOLN: INTRAVENOUS | Status: DC

## 2023-06-21 MED ORDER — DEXTROSE 50 % IV SOLN: 1.0000 | Freq: Once | INTRAVENOUS | Status: AC

## 2023-06-21 SURGICAL SUPPLY — 1 items: ELECT DEFIB PAD ADLT CADENCE (PAD) ×1 IMPLANT

## 2023-06-21 NOTE — Interval H&P Note (Signed)
History and Physical Interval Note:  06/21/2023 10:49 AM  Shannon Lucas  has presented today for surgery, with the diagnosis of AFLUTTER.  The various methods of treatment have been discussed with the patient and family. After consideration of risks, benefits and other options for treatment, the patient has consented to  Procedure(s): CARDIOVERSION (N/A) as a surgical intervention.  The patient's history has been reviewed, patient examined, no change in status, stable for surgery.  I have reviewed the patient's chart and labs.  Questions were answered to the patient's satisfaction.      

## 2023-06-21 NOTE — Transfer of Care (Signed)
Immediate Anesthesia Transfer of Care Note  Patient: Shannon Lucas  Procedure(s) Performed: CARDIOVERSION  Patient Location: Endoscopy Unit  Anesthesia Type:General  Level of Consciousness: awake and drowsy  Airway & Oxygen Therapy: Patient Spontanous Breathing and Patient connected to nasal cannula oxygen  Post-op Assessment: Report given to RN and Post -op Vital signs reviewed and stable  Post vital signs: Reviewed and stable  Last Vitals:  Vitals Value Taken Time  BP 117/55 06/21/23 1242  Temp 35.8 C 06/21/23 1241  Pulse 61 06/21/23 1245  Resp 16 06/21/23 1245  SpO2 97 % 06/21/23 1245  Vitals shown include unfiled device data.  Last Pain:  Vitals:   06/21/23 1241  TempSrc: Temporal  PainSc: 0-No pain         Complications: No notable events documented.

## 2023-06-21 NOTE — CV Procedure (Signed)
   Electrical Cardioversion Procedure Note Shannon Lucas [id number] Mar 28, 1931  Procedure: Electrical Cardioversion Indications:  Atrial Fibrillation  Time Out: Verified patient identification, verified procedure,medications/allergies/relevent history reviewed, required imaging and test results available.  Time out was performed Procedure Details  The patient signed informed consent.   The patient was NPO past midnight. Has had therapeutic anticoagulation with Xeralto greater than 3 weeks. The patient denies any interruption of anticoagulation.  Anesthesia was administered by .  Adequate airway was maintained throughout and vital followed per protocol.  He was cardioverted x 1 with 200 J of biphasic synchronized energy.  He converted to NSR.  There were no apparent complications.  The patient tolerated the procedure well and had normal neuro status and respiratory status post procedure with vitals stable as recorded elsewhere.     IMPRESSION:  Successful cardioversion of atrial fibrillation   Follow up:  We will arrange follow up with her primary cardiologist.  He will continue on current medical therapy.  The patient advised to continue anticoagulation.    06/21/2023, 1:17 PM

## 2023-06-21 NOTE — Anesthesia Preprocedure Evaluation (Addendum)
Anesthesia Evaluation  Patient identified by MRN, date of birth, ID band Patient awake    Reviewed: Allergy & Precautions, NPO status , Patient's Chart, lab work & pertinent test results, reviewed documented beta blocker date and time   History of Anesthesia Complications Negative for: history of anesthetic complications  Airway Mallampati: II  TM Distance: >3 FB Neck ROM: Full    Dental  (+) Dental Advisory Given   Pulmonary former smoker   Pulmonary exam normal        Cardiovascular hypertension, Pt. on medications and Pt. on home beta blockers + Past MI and +CHF  + dysrhythmias Atrial Fibrillation + pacemaker  Rhythm:Irregular Rate:Normal   '22 TTE - EF 55-60%. Partial fusion of left and noncoronary cusps with mild AS (mean gradient 13 mmHg). There is mild left ventricular hypertrophy. Grade II diastolic dysfunction (pseudonormalization). Left atrial size was moderately dilated. Trivial mitral valve regurgitation. Mild aortic valve stenosis.      Neuro/Psych  PSYCHIATRIC DISORDERS  Depression    TIACVA, No Residual Symptoms    GI/Hepatic Neg liver ROS,GERD  Medicated,,  Endo/Other  diabetes, Type 2, Insulin Dependent    Renal/GU CRFRenal disease     Musculoskeletal  (+) Arthritis ,    Abdominal   Peds  Hematology  On xarelto    Anesthesia Other Findings   Reproductive/Obstetrics                             Anesthesia Physical Anesthesia Plan  ASA: 3  Anesthesia Plan: General   Post-op Pain Management: Minimal or no pain anticipated   Induction: Intravenous  PONV Risk Score and Plan: 3 and Treatment may vary due to age or medical condition and Propofol infusion  Airway Management Planned: Natural Airway and Mask  Additional Equipment: None  Intra-op Plan:   Post-operative Plan:   Informed Consent: I have reviewed the patients History and Physical, chart, labs and  discussed the procedure including the risks, benefits and alternatives for the proposed anesthesia with the patient or authorized representative who has indicated his/her understanding and acceptance.       Plan Discussed with: CRNA and Anesthesiologist  Anesthesia Plan Comments:        Anesthesia Quick Evaluation

## 2023-06-21 NOTE — Anesthesia Procedure Notes (Signed)
Date/Time: 06/21/2023 12:32 PM  Performed by: Laruth Bouchard., CRNAPre-anesthesia Checklist: Patient identified, Emergency Drugs available, Suction available, Patient being monitored and Timeout performed Patient Re-evaluated:Patient Re-evaluated prior to induction Oxygen Delivery Method: Nasal cannula Preoxygenation: Pre-oxygenation with 100% oxygen Induction Type: IV induction Placement Confirmation: positive ETCO2

## 2023-06-21 NOTE — Discharge Instructions (Signed)
Electrical Cardioversion Electrical cardioversion is the delivery of a jolt of electricity to restore a normal rhythm to the heart. A rhythm that is too fast or is not regular (arrhythmia) keeps the heart from pumping blood well. There is also another type of cardioversion called a chemical (pharmacologic) cardioversion. This is when your health care provider gives you one or more medicines to bring back your regular heart rhythm. Electrical cardioversion is done as a scheduled procedure for arrhythmiasthat are not life-threatening. Electrical cardioversion may also be done in an emergency for sudden life-threatening arrhythmias. Tell a health care provider about: Any allergies you have. All medicines you are taking, including vitamins, herbs, eye drops, creams, and over-the-counter medicines. Any problems you or family members have had with sedatives or anesthesia. Any bleeding problems you have. Any surgeries you have had, including a pacemaker, defibrillator, or other implanted device. Any medical conditions you have. Whether you are pregnant or may be pregnant. What are the risks? Your provider will talk with you about risks. These include: Allergic reactions to medicines. Irritation to the skin on your chest or back where the sticky pads (electrodes) or paddles were put during electrical cardioversion. A blood clot that breaks free and travels to other parts of your body, such as your brain. Return of a worse abnormal heart rhythm that will need to be treated with medicines, a pacemaker, or an implantable cardioverter defibrillator (ICD). What happens before the procedure? Medicines Your provider may give you: Blood-thinning medicines (anticoagulants) so your blood does not clot as easily. If your provider gives you this medicine, you may need to take it for 4 weeks before the procedure. Medicines to help stabilize your heart rate and rhythm. Ask your provider about: Changing or stopping  your regular medicines. These include any diabetes medicines or blood thinners you take. Taking medicines such as aspirin and ibuprofen. These medicines can thin your blood. Do not take them unless your provider tells you to. Taking over-the-counter medicines, vitamins, herbs, and supplements. General instructions Follow instructions from your provider about what you may eat and drink. Do not put any lotions, powders, or ointments on your chest and back for 24 hours before the procedure. They can cause problems with the electrodes or paddles used to deliver electricity to your heart. Do not wear jewelry as this can interfere with delivering electricity to your heart. If you will be going home right after the procedure, plan to have a responsible adult: Take you home from the hospital or clinic. You will not be allowed to drive. Care for you for the time you are told. Tests You may have an exam or testing. This may include: Blood labs. A transesophageal echocardiogram (TEE). What happens during the procedure?     An IV will be inserted into one of your veins. You will be given a sedative. This helps you relax. Electrodes or metal paddles will be placed on your chest. They may be placed in one of these ways: One placed on your right chest, the other on the left ribs. One placed on your chest and the other on your back. An electrical shock will be delivered. The shock briefly stops (resets) your heart rhythm. Your provider will check to see if your heart rhythm is now normal. Some people need only one shock. Some need more to restore a normal heart rhythm. The procedure may vary among providers and hospitals. What happens after the procedure? Your blood pressure, heart rate, breathing rate, and blood oxygen  level will be monitored until you leave the hospital or clinic. Your heart rhythm will be watched to make sure it does not change. This information is not intended to replace advice  given to you by your health care provider. Make sure you discuss any questions you have with your health care provider. Document Revised: 06/23/2022 Document Reviewed: 06/23/2022 Elsevier Patient Education  2024 [employer].

## 2023-06-21 NOTE — Anesthesia Postprocedure Evaluation (Signed)
Anesthesia Post Note  Patient: Shannon Lucas  Procedure(s) Performed: CARDIOVERSION     Patient location during evaluation: PACU Anesthesia Type: General Level of consciousness: awake and alert Pain management: pain level controlled Vital Signs Assessment: post-procedure vital signs reviewed and stable Respiratory status: spontaneous breathing, nonlabored ventilation and respiratory function stable Cardiovascular status: stable and blood pressure returned to baseline Anesthetic complications: no   No notable events documented.  Last Vitals:  Vitals:   06/21/23 1305 06/21/23 1307  BP: 122/65 135/65  Pulse: (!) 59 60  Resp: (!) 24 19  Temp:    SpO2: 97% 98%    Last Pain:  Vitals:   06/21/23 1241  TempSrc: Temporal  PainSc: 0-No pain                 Beryle Lathe

## 2023-06-21 NOTE — Progress Notes (Addendum)
Dr. Servando Salina aware of pts Accu-check result at 14, order obtained and carried out, see MAR, glucose to be re-checked, see flowsheets, safety maintained

## 2023-06-22 ENCOUNTER — Encounter (HOSPITAL_COMMUNITY): Payer: Self-pay | Admitting: Cardiology

## 2023-07-05 ENCOUNTER — Other Ambulatory Visit (HOSPITAL_COMMUNITY): Payer: Self-pay | Admitting: Family Medicine

## 2023-07-05 ENCOUNTER — Telehealth (HOSPITAL_COMMUNITY): Payer: Self-pay | Admitting: Internal Medicine

## 2023-07-05 ENCOUNTER — Ambulatory Visit (HOSPITAL_COMMUNITY): Payer: Medicare Other | Admitting: Internal Medicine

## 2023-07-05 NOTE — Telephone Encounter (Signed)
Daughter called and stated that she wasn't feeling well and asked that I reschedule her for one day next week in the evening.

## 2023-07-11 ENCOUNTER — Ambulatory Visit (HOSPITAL_COMMUNITY)
Admission: RE | Admit: 2023-07-11 | Discharge: 2023-07-11 | Disposition: A | Discharge status: Home / Self Care | Payer: Medicare Other | Source: Ambulatory Visit | Attending: Internal Medicine | Admitting: Internal Medicine

## 2023-07-11 VITALS — BP 128/92 | HR 61 | Ht 65.0 in | Wt 185.0 lb

## 2023-07-11 DIAGNOSIS — I5022 Chronic systolic (congestive) heart failure: Secondary | ICD-10-CM | POA: Insufficient documentation

## 2023-07-11 DIAGNOSIS — Z794 Long term (current) use of insulin: Secondary | ICD-10-CM | POA: Insufficient documentation

## 2023-07-11 DIAGNOSIS — R0602 Shortness of breath: Secondary | ICD-10-CM | POA: Diagnosis present

## 2023-07-11 DIAGNOSIS — I130 Hypertensive heart and chronic kidney disease with heart failure and stage 1 through stage 4 chronic kidney disease, or unspecified chronic kidney disease: Secondary | ICD-10-CM | POA: Insufficient documentation

## 2023-07-11 DIAGNOSIS — N183 Chronic kidney disease, stage 3 unspecified: Secondary | ICD-10-CM | POA: Diagnosis not present

## 2023-07-11 DIAGNOSIS — Z950 Presence of cardiac pacemaker: Secondary | ICD-10-CM | POA: Diagnosis not present

## 2023-07-11 DIAGNOSIS — E1122 Type 2 diabetes mellitus with diabetic chronic kidney disease: Secondary | ICD-10-CM | POA: Insufficient documentation

## 2023-07-11 DIAGNOSIS — Z79899 Other long term (current) drug therapy: Secondary | ICD-10-CM | POA: Insufficient documentation

## 2023-07-11 DIAGNOSIS — Z8673 Personal history of transient ischemic attack (TIA), and cerebral infarction without residual deficits: Secondary | ICD-10-CM | POA: Diagnosis not present

## 2023-07-11 DIAGNOSIS — I4891 Unspecified atrial fibrillation: Secondary | ICD-10-CM

## 2023-07-11 DIAGNOSIS — I4819 Other persistent atrial fibrillation: Secondary | ICD-10-CM | POA: Diagnosis not present

## 2023-07-11 DIAGNOSIS — E785 Hyperlipidemia, unspecified: Secondary | ICD-10-CM | POA: Insufficient documentation

## 2023-07-11 DIAGNOSIS — D631 Anemia in chronic kidney disease: Secondary | ICD-10-CM | POA: Insufficient documentation

## 2023-07-11 DIAGNOSIS — Z7901 Long term (current) use of anticoagulants: Secondary | ICD-10-CM | POA: Insufficient documentation

## 2023-07-11 DIAGNOSIS — D6869 Other thrombophilia: Secondary | ICD-10-CM

## 2023-07-11 NOTE — Progress Notes (Signed)
Primary Care Physician: Shellia Cleverly, [place] Primary Cardiologist: Marca Ancona, MD Electrophysiologist: Will Jorja Loa, MD     Referring Physician: Dr. Jon Billings Broeker is a 89 y.o. female with a history of systolic CHF, nonobstructive CAD, CKD stage III, T2DM, GERD, tachy/brady syndrome s/p pacemaker implantation in 11/2017, HLD, CVA, anemia, and paroxysmal atrial fibrillation who presents for consultation in the [facility]. Recent device interrogation 04/24/23 showed increased Afib burden since April 2024 with average ventricular rates less than 100 bpm. Patient is on Xarelto 15 mg daily for a CHADS2VASC score of 8.  On evaluation today, she is currently in rate controlled Afib. She feels SOB and tired when in Afib. Daughter notes that she sleeps a lot more compared to previous. She is on Xarelto 15 mg daily and has not missed any doses.   On follow up 07/11/23, she is currently in NSR. S/p successful DCCV on 8/7 after amiodarone reload. She is currently on amiodarone 200 mg daily.   Today, she denies symptoms of palpitations, chest pain, orthopnea, PND, lower extremity edema, dizziness, presyncope, syncope, snoring, daytime somnolence, bleeding, or neurologic sequela. The patient is tolerating medications without difficulties and is otherwise without complaint today.    she has a BMI of Body mass index is 30.79 kg/m.Marland Kitchen Filed Weights   07/11/23 1404  Weight: 83.9 kg     Current Outpatient Medications  Medication Sig Dispense Refill   acetaminophen (TYLENOL) 500 MG tablet Take 1,000 mg by mouth every 6 (six) hours as needed for moderate pain or headache.     albuterol (VENTOLIN HFA) 108 (90 Base) MCG/ACT inhaler Inhale 2 puffs into the lungs every 6 (six) hours as needed for wheezing or shortness of breath.     amiodarone (PACERONE) 200 MG tablet Take 1 tablet by mouth twice a day for 21 days then reduce back to once a day 60 tablet 0    benzonatate (TESSALON) 100 MG capsule Take 100 mg by mouth 3 (three) times daily as needed for cough.     cholecalciferol (VITAMIN D3) 25 MCG (1000 UNIT) tablet Take 1,000 Units by mouth daily.     diphenoxylate-atropine (LOMOTIL) 2.5-0.025 MG tablet Take 1 tablet by mouth 4 (four) times daily as needed for diarrhea or loose stools.     doxycycline (VIBRAMYCIN) 100 MG capsule Take 100 mg by mouth 2 (two) times daily.     ferrous sulfate 325 (65 FE) MG EC tablet Take 325 mg by mouth daily.     hydrALAZINE (APRESOLINE) 50 MG tablet Take 100 mg by mouth 3 (three) times daily.     insulin glargine (LANTUS) 100 UNIT/ML injection Inject 55 Units into the skin daily.     isosorbide mononitrate (IMDUR) 30 MG 24 hr tablet Take 1 tablet (30 mg total) by mouth daily. 90 tablet 2   levothyroxine (SYNTHROID) 50 MCG tablet Take 50 mcg by mouth daily.     metoprolol succinate (TOPROL-XL) 50 MG 24 hr tablet Take 1 tablet (50 mg total) by mouth daily. TAKE 1 TABLET BY MOUTH DAILY IMMEDIATELYFOLLOWING A MEAL 90 tablet 3   OLANZapine (ZYPREXA) 2.5 MG tablet Take 2.5 mg by mouth at bedtime.     omeprazole (PRILOSEC) 20 MG capsule Take 20 mg by mouth daily.     potassium chloride SA (KLOR-CON M) 20 MEQ tablet TAKE ONE TABLET BY MOUTH DAILY 30 tablet 7   pravastatin (PRAVACHOL) 40 MG tablet Take 1 tablet  by mouth at bedtime. 90 tablet 1   torsemide (DEMADEX) 20 MG tablet Take 1 tablet (20 mg total) by mouth daily. 30 tablet 11   venlafaxine XR (EFFEXOR-XR) 75 MG 24 hr capsule Take 75 mg by mouth daily.     XARELTO 15 MG TABS tablet TAKE 1 TABLET BY MOUTH DAILY WITH SUPPER 30 tablet 11   No current facility-administered medications for this encounter.    Atrial Fibrillation Management history:  Previous antiarrhythmic drugs: amiodarone Previous cardioversions: 06/21/23 Previous ablations: None Anticoagulation history: Xarelto 15 mg    ROS- All systems are reviewed and negative except as per the HPI  above.  Physical Exam: Ht 5\' 5"  (1.651 m)   Wt 83.9 kg   BMI 30.79 kg/m   GEN- The patient is well appearing, alert and oriented x 3 today.   Neck - no JVD or carotid bruit noted Lungs- Clear to ausculation bilaterally, normal work of breathing Heart- Regular rate and rhythm, no murmurs, rubs or gallops, PMI not laterally displaced Extremities- no clubbing, cyanosis, or edema Skin - no rash or ecchymosis noted   EKG today demonstrates  Vent. rate 61 BPM PR interval 412 ms QRS duration 152 ms QT/QTcB 516/519 ms P-R-T axes * 225 -8 Atrial-paced rhythm with prolonged AV conduction Non-specific intra-ventricular conduction block Possible Lateral infarct , age undetermined Abnormal ECG When compared with ECG of 21-Jun-2023 12:42, PREVIOUS ECG IS PRESENT  Echo 06/01/21 demonstrated  1. Partial fusion of left and noncoronary cusps with mild AS (mean  gradient 13 mmHg).   2. Left ventricular ejection fraction, by estimation, is 55 to 60%. The  left ventricle has normal function. The left ventricle has no regional  wall motion abnormalities. There is mild left ventricular hypertrophy.  Left ventricular diastolic parameters  are consistent with Grade II diastolic dysfunction (pseudonormalization).  Elevated left atrial pressure.   3. Right ventricular systolic function is normal. The right ventricular  size is normal.   4. Left atrial size was moderately dilated.   5. The mitral valve is normal in structure. Trivial mitral valve  regurgitation. No evidence of mitral stenosis.   6. The aortic valve is normal in structure. Aortic valve regurgitation is  not visualized. Mild aortic valve stenosis.   7. The inferior vena cava is normal in size with greater than 50%  respiratory variability, suggesting right atrial pressure of 3 mmHg.    ASSESSMENT & PLAN CHA2DS2-VASc Score = 8  The patient's score is based upon: CHF History: 1 HTN History: 0 Diabetes History: 1 Stroke History:  2 Vascular Disease History: 1 Age Score: 2 Gender Score: 1      ASSESSMENT AND PLAN: Persistent Atrial Fibrillation (ICD10:  I48.0) The patient's CHA2DS2-VASc score is 8, indicating a 10.8% annual risk of stroke.   S/p successful DCCV on 06/21/23.   She appears to be in NSR.  Continue amiodarone 200 mg daily.  She would only be eligible for the lowest dose of 125 mcg BID for Tikosyn.   Secondary Hypercoagulable State (ICD10:  D68.69) The patient is at significant risk for stroke/thromboembolism based upon her CHA2DS2-VASc Score of 8.  Continue Rivaroxaban (Xarelto).  No missed doses.    F/u Afib clinic prn per daughter's request.   Lake Bells, PA-C  Af[facility] Clinic [facility] [address] [place], [place] [address] [phone]

## 2023-07-16 ENCOUNTER — Emergency Department (HOSPITAL_COMMUNITY): Payer: Medicare Other

## 2023-07-16 ENCOUNTER — Other Ambulatory Visit: Payer: Self-pay

## 2023-07-16 ENCOUNTER — Inpatient Hospital Stay (HOSPITAL_COMMUNITY)
Admission: EM | Admit: 2023-07-16 | Discharge: 2023-08-15 | DRG: 951 | Disposition: E | Payer: Medicare Other | Attending: Internal Medicine | Admitting: Internal Medicine

## 2023-07-16 ENCOUNTER — Encounter (HOSPITAL_COMMUNITY): Payer: Self-pay

## 2023-07-16 DIAGNOSIS — I130 Hypertensive heart and chronic kidney disease with heart failure and stage 1 through stage 4 chronic kidney disease, or unspecified chronic kidney disease: Secondary | ICD-10-CM | POA: Diagnosis present

## 2023-07-16 DIAGNOSIS — R29810 Facial weakness: Secondary | ICD-10-CM | POA: Diagnosis present

## 2023-07-16 DIAGNOSIS — Z794 Long term (current) use of insulin: Secondary | ICD-10-CM | POA: Diagnosis not present

## 2023-07-16 DIAGNOSIS — N1831 Chronic kidney disease, stage 3a: Secondary | ICD-10-CM | POA: Diagnosis present

## 2023-07-16 DIAGNOSIS — Z85820 Personal history of malignant melanoma of skin: Secondary | ICD-10-CM | POA: Diagnosis not present

## 2023-07-16 DIAGNOSIS — Z823 Family history of stroke: Secondary | ICD-10-CM

## 2023-07-16 DIAGNOSIS — I611 Nontraumatic intracerebral hemorrhage in hemisphere, cortical: Secondary | ICD-10-CM | POA: Diagnosis present

## 2023-07-16 DIAGNOSIS — E7800 Pure hypercholesterolemia, unspecified: Secondary | ICD-10-CM | POA: Diagnosis present

## 2023-07-16 DIAGNOSIS — K219 Gastro-esophageal reflux disease without esophagitis: Secondary | ICD-10-CM | POA: Diagnosis present

## 2023-07-16 DIAGNOSIS — E039 Hypothyroidism, unspecified: Secondary | ICD-10-CM | POA: Diagnosis present

## 2023-07-16 DIAGNOSIS — D6832 Hemorrhagic disorder due to extrinsic circulating anticoagulants: Secondary | ICD-10-CM | POA: Diagnosis present

## 2023-07-16 DIAGNOSIS — Z87891 Personal history of nicotine dependence: Secondary | ICD-10-CM

## 2023-07-16 DIAGNOSIS — Z79890 Hormone replacement therapy (postmenopausal): Secondary | ICD-10-CM | POA: Diagnosis not present

## 2023-07-16 DIAGNOSIS — G8194 Hemiplegia, unspecified affecting left nondominant side: Secondary | ICD-10-CM | POA: Diagnosis present

## 2023-07-16 DIAGNOSIS — I252 Old myocardial infarction: Secondary | ICD-10-CM | POA: Diagnosis not present

## 2023-07-16 DIAGNOSIS — Z833 Family history of diabetes mellitus: Secondary | ICD-10-CM

## 2023-07-16 DIAGNOSIS — F32A Depression, unspecified: Secondary | ICD-10-CM | POA: Diagnosis present

## 2023-07-16 DIAGNOSIS — I480 Paroxysmal atrial fibrillation: Secondary | ICD-10-CM | POA: Diagnosis present

## 2023-07-16 DIAGNOSIS — I5022 Chronic systolic (congestive) heart failure: Secondary | ICD-10-CM | POA: Diagnosis present

## 2023-07-16 DIAGNOSIS — I2510 Atherosclerotic heart disease of native coronary artery without angina pectoris: Secondary | ICD-10-CM | POA: Diagnosis present

## 2023-07-16 DIAGNOSIS — S0633AA Contusion and laceration of cerebrum, unspecified, with loss of consciousness status unknown, initial encounter: Principal | ICD-10-CM | POA: Diagnosis present

## 2023-07-16 DIAGNOSIS — N183 Chronic kidney disease, stage 3 unspecified: Secondary | ICD-10-CM | POA: Diagnosis present

## 2023-07-16 DIAGNOSIS — Z8673 Personal history of transient ischemic attack (TIA), and cerebral infarction without residual deficits: Secondary | ICD-10-CM

## 2023-07-16 DIAGNOSIS — I495 Sick sinus syndrome: Secondary | ICD-10-CM | POA: Diagnosis present

## 2023-07-16 DIAGNOSIS — Z820 Family history of epilepsy and other diseases of the nervous system: Secondary | ICD-10-CM

## 2023-07-16 DIAGNOSIS — I614 Nontraumatic intracerebral hemorrhage in cerebellum: Secondary | ICD-10-CM | POA: Diagnosis present

## 2023-07-16 DIAGNOSIS — G935 Compression of brain: Secondary | ICD-10-CM | POA: Diagnosis present

## 2023-07-16 DIAGNOSIS — M1990 Unspecified osteoarthritis, unspecified site: Secondary | ICD-10-CM | POA: Diagnosis present

## 2023-07-16 DIAGNOSIS — D689 Coagulation defect, unspecified: Secondary | ICD-10-CM | POA: Diagnosis not present

## 2023-07-16 DIAGNOSIS — I161 Hypertensive emergency: Secondary | ICD-10-CM | POA: Diagnosis present

## 2023-07-16 DIAGNOSIS — Z515 Encounter for palliative care: Principal | ICD-10-CM

## 2023-07-16 DIAGNOSIS — R29742 NIHSS score 42: Secondary | ICD-10-CM | POA: Diagnosis present

## 2023-07-16 DIAGNOSIS — E1122 Type 2 diabetes mellitus with diabetic chronic kidney disease: Secondary | ICD-10-CM | POA: Diagnosis present

## 2023-07-16 DIAGNOSIS — Z7901 Long term (current) use of anticoagulants: Secondary | ICD-10-CM

## 2023-07-16 DIAGNOSIS — R471 Dysarthria and anarthria: Secondary | ICD-10-CM | POA: Diagnosis present

## 2023-07-16 DIAGNOSIS — Z950 Presence of cardiac pacemaker: Secondary | ICD-10-CM

## 2023-07-16 DIAGNOSIS — E119 Type 2 diabetes mellitus without complications: Secondary | ICD-10-CM

## 2023-07-16 DIAGNOSIS — Z66 Do not resuscitate: Secondary | ICD-10-CM | POA: Diagnosis present

## 2023-07-16 DIAGNOSIS — I619 Nontraumatic intracerebral hemorrhage, unspecified: Secondary | ICD-10-CM

## 2023-07-16 DIAGNOSIS — Z79899 Other long term (current) drug therapy: Secondary | ICD-10-CM

## 2023-07-16 DIAGNOSIS — R29728 NIHSS score 28: Secondary | ICD-10-CM | POA: Diagnosis present

## 2023-07-16 DIAGNOSIS — T45515A Adverse effect of anticoagulants, initial encounter: Secondary | ICD-10-CM | POA: Diagnosis present

## 2023-07-16 LAB — DIFFERENTIAL
Abs Immature Granulocytes: 0.05 10*3/uL (ref 0.00–0.07)
Basophils Absolute: 0.1 10*3/uL (ref 0.0–0.1)
Basophils Relative: 1 %
Eosinophils Absolute: 0.1 10*3/uL (ref 0.0–0.5)
Eosinophils Relative: 2 %
Immature Granulocytes: 1 %
Lymphocytes Relative: 21 %
Lymphs Abs: 1.9 10*3/uL (ref 0.7–4.0)
Monocytes Absolute: 0.6 10*3/uL (ref 0.1–1.0)
Monocytes Relative: 7 %
Neutro Abs: 6.4 10*3/uL (ref 1.7–7.7)
Neutrophils Relative %: 68 %

## 2023-07-16 LAB — COMPREHENSIVE METABOLIC PANEL
ALT: 29 U/L (ref 0–44)
AST: 35 U/L (ref 15–41)
Albumin: 3.5 g/dL (ref 3.5–5.0)
Alkaline Phosphatase: 121 U/L (ref 38–126)
Anion gap: 13 (ref 5–15)
BUN: 26 mg/dL — ABNORMAL HIGH (ref 8–23)
CO2: 21 mmol/L — ABNORMAL LOW (ref 22–32)
Calcium: 9.7 mg/dL (ref 8.9–10.3)
Chloride: 107 mmol/L (ref 98–111)
Creatinine, Ser: 1.82 mg/dL — ABNORMAL HIGH (ref 0.44–1.00)
GFR, Estimated: 26 mL/min — ABNORMAL LOW (ref >60)
Glucose, Bld: 198 mg/dL — ABNORMAL HIGH (ref 70–99)
Potassium: 3.6 mmol/L (ref 3.5–5.1)
Sodium: 141 mmol/L (ref 135–145)
Total Bilirubin: 0.8 mg/dL (ref 0.3–1.2)
Total Protein: 6.5 g/dL (ref 6.5–8.1)

## 2023-07-16 LAB — I-STAT CHEM 8, ED
BUN: 29 mg/dL — ABNORMAL HIGH (ref 8–23)
Calcium, Ion: 1.22 mmol/L (ref 1.15–1.40)
Chloride: 110 mmol/L (ref 98–111)
Creatinine, Ser: 1.8 mg/dL — ABNORMAL HIGH (ref 0.44–1.00)
Glucose, Bld: 195 mg/dL — ABNORMAL HIGH (ref 70–99)
HCT: 37 % (ref 36.0–46.0)
Hemoglobin: 12.6 g/dL (ref 12.0–15.0)
Potassium: 3.7 mmol/L (ref 3.5–5.1)
Sodium: 142 mmol/L (ref 135–145)
TCO2: 21 mmol/L — ABNORMAL LOW (ref 22–32)

## 2023-07-16 LAB — ETHANOL: Alcohol, Ethyl (B): <10 mg/dL (ref <10)

## 2023-07-16 LAB — CBG MONITORING, ED: Glucose-Capillary: 176 mg/dL — ABNORMAL HIGH (ref 70–99)

## 2023-07-16 LAB — APTT: aPTT: 30 s (ref 24–36)

## 2023-07-16 LAB — CBC
HCT: 37.7 % (ref 36.0–46.0)
Hemoglobin: 12.1 g/dL (ref 12.0–15.0)
MCH: 31.3 pg (ref 26.0–34.0)
MCHC: 32.1 g/dL (ref 30.0–36.0)
MCV: 97.7 fL (ref 80.0–100.0)
Platelets: 187 10*3/uL (ref 150–400)
RBC: 3.86 MIL/uL — ABNORMAL LOW (ref 3.87–5.11)
RDW: 14.6 % (ref 11.5–15.5)
WBC: 9.2 10*3/uL (ref 4.0–10.5)
nRBC: 0 % (ref 0.0–0.2)

## 2023-07-16 LAB — PROTIME-INR
INR: 1.9 — ABNORMAL HIGH (ref 0.8–1.2)
Prothrombin Time: 22.1 s — ABNORMAL HIGH (ref 11.4–15.2)

## 2023-07-16 MED ORDER — BIOTENE DRY MOUTH MT LIQD: 15.0000 mL | OROMUCOSAL | Status: DC | PRN

## 2023-07-16 MED ORDER — GLYCOPYRROLATE 0.2 MG/ML IJ SOLN: 0.2000 mg | INTRAMUSCULAR | Status: DC | PRN

## 2023-07-16 MED ORDER — ACETAMINOPHEN 650 MG RE SUPP: 650.0000 mg | Freq: Four times a day (QID) | RECTAL | Status: DC | PRN

## 2023-07-16 MED ORDER — ONDANSETRON 4 MG PO TBDP: 4.0000 mg | ORAL_TABLET | Freq: Four times a day (QID) | ORAL | Status: DC | PRN

## 2023-07-16 MED ORDER — HALOPERIDOL 0.5 MG PO TABS: 0.5000 mg | ORAL_TABLET | ORAL | Status: DC | PRN

## 2023-07-16 MED ORDER — CLEVIDIPINE BUTYRATE 0.5 MG/ML IV EMUL: 0.0000 mg/h | INTRAVENOUS | Status: DC

## 2023-07-16 MED ORDER — LORAZEPAM 2 MG/ML IJ SOLN: 1.0000 mg | INTRAMUSCULAR | Status: DC | PRN

## 2023-07-16 MED ORDER — LORAZEPAM 2 MG/ML PO CONC: 1.0000 mg | ORAL | Status: DC | PRN

## 2023-07-16 MED ORDER — LABETALOL HCL 5 MG/ML IV SOLN
10.0000 mg | Freq: Once | INTRAVENOUS | Status: AC
  Administered 2023-07-16: 10 mg via INTRAVENOUS

## 2023-07-16 MED ORDER — SODIUM CHLORIDE 0.9 % IV SOLN: INTRAVENOUS | Status: DC

## 2023-07-16 MED ORDER — SODIUM CHLORIDE 0.9% FLUSH: 3.0000 mL | Freq: Once | INTRAVENOUS | Status: DC

## 2023-07-16 MED ORDER — MORPHINE BOLUS VIA INFUSION: 5.0000 mg | INTRAVENOUS | Status: DC | PRN

## 2023-07-16 MED ORDER — LORAZEPAM 1 MG PO TABS: 1.0000 mg | ORAL_TABLET | ORAL | Status: DC | PRN

## 2023-07-16 MED ORDER — HALOPERIDOL LACTATE 2 MG/ML PO CONC: 0.5000 mg | ORAL | Status: DC | PRN

## 2023-07-16 MED ORDER — ONDANSETRON HCL 4 MG/2ML IJ SOLN: 4.0000 mg | Freq: Four times a day (QID) | INTRAMUSCULAR | Status: DC | PRN

## 2023-07-16 MED ORDER — MORPHINE 100MG IN NS 100ML (1MG/ML) PREMIX INFUSION
0.0000 mg/h | INTRAVENOUS | Status: DC
  Administered 2023-07-16: 5 mg/h via INTRAVENOUS
  Filled 2023-07-16: qty 100

## 2023-07-16 MED ORDER — IOHEXOL 350 MG/ML SOLN
75.0000 mL | Freq: Once | INTRAVENOUS | Status: AC | PRN
  Administered 2023-07-16: 75 mL via INTRAVENOUS

## 2023-07-16 MED ORDER — GLYCOPYRROLATE 1 MG PO TABS: 1.0000 mg | ORAL_TABLET | ORAL | Status: DC | PRN

## 2023-07-16 MED ORDER — ACETAMINOPHEN 325 MG PO TABS: 650.0000 mg | ORAL_TABLET | Freq: Four times a day (QID) | ORAL | Status: DC | PRN

## 2023-07-16 MED ORDER — POLYVINYL ALCOHOL 1.4 % OP SOLN: 1.0000 [drp] | Freq: Four times a day (QID) | OPHTHALMIC | Status: DC | PRN

## 2023-07-16 MED ORDER — HALOPERIDOL LACTATE 5 MG/ML IJ SOLN: 0.5000 mg | INTRAMUSCULAR | Status: DC | PRN

## 2023-07-16 MED ORDER — EMPTY CONTAINERS FLEXIBLE MISC
900.0000 mg | Freq: Once | Status: DC
  Filled 2023-07-16: qty 90

## 2023-07-24 ENCOUNTER — Ambulatory Visit: Payer: Medicare Other

## 2023-08-07 NOTE — Telephone Encounter (Signed)
error 

## 2023-08-14 ENCOUNTER — Institutional Professional Consult (permissible substitution): Payer: Medicare Other | Admitting: Cardiology

## 2023-08-15 NOTE — Consult Note (Addendum)
Neurology Consultation  Reason for Consult: CODE STROKE Referring Physician: Dr. Charm Barges  CC: slurred speech, left facial droop, left-sided weakness  History is obtained from: chart review, EMS.   HPI: Shannon Lucas is a 89 y.o. female with PMH significant for Afib, on Xarelto, recent DCCV 8/7 after amio reload, Pacemaker (2019), CHF, CAD, CKD 3a, DM2 BIB EMS as a CODE STROKE due to slight left facial droop, slurred speech and left-sided weakness. On exam at bridge, patient is alert and oriented, follows commands, dysarthric speech, slight left facial droop and slight left-sided weakness. Patient endorsed a severe headache. Per EMS, patient vomited x1, was given zofran, and her last systolic BP was in the 230s. NIH 6. She was taken emergently to CT, which showed large cerebellar IPH. While in CT, patient began to decline neurologically, becoming less responsive. She required one dose of labetalol for BP control. She was taken back to ED 33, where she continued to decline neurologically. She now was not blinking to threat, pupils 5 and sluggish response, gave no vocal response and was posturing. Respirations were becoming more shallow and sonorous, but she was protecting her airway. NIH 10 Daughter and grand-daughter were updated on the phone by Dr. Derry Lory and arrived to the ED soon after. At bedside, Dr. Charm Barges, Dr. Derry Lory and APP all reviewed her full assessment and very poor prognosis with daughter and grand-daughter. After extensive discussion, family elected to make the patient an DNR and elect for comfort care measures only.   LKW: 1415 TNK given?: no, IPH IR Thrombectomy? No, no LVO Modified Rankin Scale: 1-No significant post stroke disability and can perform usual duties with stroke symptoms  NIHSS: (on arrival)     1a Level of Conscious.: 0 1b LOC Questions: 0 1c LOC Commands: 0 2 Best Gaze: 0 3 Visual: 0 4 Facial Palsy: 1 5a Motor Arm - left: 1 5b Motor Arm - Right:0  6a  Motor Leg - Left: 2 6b Motor Leg - Right: 0 7 Limb Ataxia: 0 8 Sensory: 0 9 Best Language: 0 10 Dysarthria: 2 11 Extinct. and Inatten.: 0 TOTAL: 6   NIHSS: (post-CT) 1a Level of Conscious.: 3 1b LOC Questions: 2 1c LOC Commands: 2 2 Best Gaze: 0 3 Visual: UTA 4 Facial Palsy: 2 5a Motor Arm - left: 4 5b Motor Arm - Right:4 6a Motor Leg - Left: 4 6b Motor Leg - Right: 4 7 Limb Ataxia: UTA 8 Sensory: UTA 9 Best Language: 3 10 Dysarthria: UTA 11 Extinct. and Inatten.: UTA TOTAL: 28  ROS: Unable to obtain due to altered mental status.   Past Medical History:  Diagnosis Date   A-fib [facility])    Arthritis    "a little bit qwhere" (02/28/2017)   CHF (congestive heart failure) (HCC)    CKD (chronic kidney disease), stage III (HCC)    [name] 02/28/2017   Depression    GERD (gastroesophageal reflux disease)    High cholesterol    Hypertension    Melanoma of back (HCC)    Myocardial infarction [facility])    "one dr said I'd had 1" (02/28/2017)   Stroke (HCC) ~ 2010; ~2012   "affected the right side but I fully recovered; just a light one" (02/28/2017)   Type II diabetes mellitus (HCC)     Family History  Problem Relation Age of Onset   Diabetes Mother    Stroke Father    Parkinson's disease Brother     Social History:   reports that she  has quit smoking. She has never used smokeless tobacco. She reports that she does not drink alcohol and does not use drugs.  Medications  Current Facility-Administered Medications:    0.9 %  sodium chloride infusion, , Intravenous, Continuous, Terrilee Files, MD   acetaminophen (TYLENOL) tablet 650 mg, 650 mg, Oral, Q6H PRN **OR** acetaminophen (TYLENOL) suppository 650 mg, 650 mg, Rectal, Q6H PRN, Terrilee Files, MD   [COMPLETED] labetalol (NORMODYNE) injection 10 mg, 10 mg, Intravenous, Once, 10 mg at 08/01/2023 1600 **AND** clevidipine (CLEVIPREX) infusion 0.5 mg/mL, 0-21 mg/hr, Intravenous, Continuous, Erick Blinks, MD   coag  fact Xa recombinant (ANDEXXA) low dose infusion 900 mg, 900 mg, Intravenous, Once, Erick Blinks, MD   glycopyrrolate (ROBINUL) tablet 1 mg, 1 mg, Oral, Q4H PRN **OR** glycopyrrolate (ROBINUL) injection 0.2 mg, 0.2 mg, Subcutaneous, Q4H PRN **OR** glycopyrrolate (ROBINUL) injection 0.2 mg, 0.2 mg, Intravenous, Q4H PRN, Terrilee Files, MD   morphine 100mg  in NS (1mg /mL) infusion - premix, 0-20 mg/hr, Intravenous, Continuous, Terrilee Files, MD   morphine bolus via infusion 5 mg, 5 mg, Intravenous, Q5 min PRN, Terrilee Files, MD   polyvinyl alcohol (LIQUIFILM TEARS) 1.4 % ophthalmic solution 1 drop, 1 drop, Both Eyes, QID PRN, Terrilee Files, MD   sodium chloride flush (NS) 0.9 % injection 3 mL, 3 mL, Intravenous, Once, Terrilee Files, MD  Current Outpatient Medications:    acetaminophen (TYLENOL) 500 MG tablet, Take 1,000 mg by mouth every 6 (six) hours as needed for moderate pain or headache., Disp: , Rfl:    albuterol (VENTOLIN HFA) 108 (90 Base) MCG/ACT inhaler, Inhale 2 puffs into the lungs every 6 (six) hours as needed for wheezing or shortness of breath., Disp: , Rfl:    amiodarone (PACERONE) 200 MG tablet, Take 1 tablet by mouth twice a day for 21 days then reduce back to once a day, Disp: 60 tablet, Rfl: 0   benzonatate (TESSALON) 100 MG capsule, Take 100 mg by mouth 3 (three) times daily as needed for cough., Disp: , Rfl:    cholecalciferol (VITAMIN D3) 25 MCG (1000 UNIT) tablet, Take 1,000 Units by mouth daily., Disp: , Rfl:    diphenoxylate-atropine (LOMOTIL) 2.5-0.025 MG tablet, Take 1 tablet by mouth 4 (four) times daily as needed for diarrhea or loose stools., Disp: , Rfl:    doxycycline (VIBRAMYCIN) 100 MG capsule, Take 100 mg by mouth 2 (two) times daily., Disp: , Rfl:    ferrous sulfate 325 (65 FE) MG EC tablet, Take 325 mg by mouth daily., Disp: , Rfl:    hydrALAZINE (APRESOLINE) 50 MG tablet, Take 100 mg by mouth 3 (three) times daily., Disp: , Rfl:     insulin glargine (LANTUS) 100 UNIT/ML injection, Inject 55 Units into the skin daily., Disp: , Rfl:    isosorbide mononitrate (IMDUR) 30 MG 24 hr tablet, Take 1 tablet (30 mg total) by mouth daily., Disp: 90 tablet, Rfl: 2   levothyroxine (SYNTHROID) 50 MCG tablet, Take 50 mcg by mouth daily., Disp: , Rfl:    metoprolol succinate (TOPROL-XL) 50 MG 24 hr tablet, Take 1 tablet (50 mg total) by mouth daily. TAKE 1 TABLET BY MOUTH DAILY IMMEDIATELYFOLLOWING A MEAL, Disp: 90 tablet, Rfl: 3   OLANZapine (ZYPREXA) 2.5 MG tablet, Take 2.5 mg by mouth at bedtime., Disp: , Rfl:    omeprazole (PRILOSEC) 20 MG capsule, Take 20 mg by mouth daily., Disp: , Rfl:    potassium chloride SA (KLOR-CON M) 20 MEQ  tablet, TAKE ONE TABLET BY MOUTH DAILY, Disp: 30 tablet, Rfl: 7   pravastatin (PRAVACHOL) 40 MG tablet, Take 1 tablet by mouth at bedtime., Disp: 90 tablet, Rfl: 1   torsemide (DEMADEX) 20 MG tablet, Take 1 tablet (20 mg total) by mouth daily., Disp: 30 tablet, Rfl: 11   venlafaxine XR (EFFEXOR-XR) 75 MG 24 hr capsule, Take 75 mg by mouth daily., Disp: , Rfl:    XARELTO 15 MG TABS tablet, TAKE 1 TABLET BY MOUTH DAILY WITH SUPPER, Disp: 30 tablet, Rfl: 11  Exam: Current vital signs: BP (!) 125/54   Pulse (!) 59   Temp (!) 97.5 F (36.4 C) (Axillary)   Resp 16   SpO2 100%  Vital signs in last 24 hours: Temp:  [97.5 F (36.4 C)] 97.5 F (36.4 C) (09/01 1621) Pulse Rate:  [59-60] 59 (09/01 1621) Resp:  [16-19] 16 (09/01 1621) BP: (125-145)/(48-54) 125/54 (09/01 1620) SpO2:  [99 %-100 %] 100 % (09/01 1621)  GENERAL: critically ill Head: Normocephalic and atraumatic, without obvious abnormality LUNGS: Shallow, sonorous respirations, requiring O2 CV: Irregular, SB on monitor ABDOMEN: Soft, non-tender, non-distended Extremities: warm, well perfused, without obvious deformity  NEURO: obtunded, reflex response only. Weak cough and gag, positive corneals.  Pupils 5 with sluggish response, Decerebrate  posturing to noxious stimuli only.   Labs I have reviewed labs in epic and the results pertinent to this consultation are: BUN/Cr: 26/1.82 PT/INR: 22.1/1.9 CBG: 176   CBC    Component Value Date/Time   WBC 9.2 08/12/2023 1542   RBC 3.86 (L) 08/04/2023 1542   HGB 12.6 08/04/2023 1547   HGB 13.7 01/06/2020 1635   HCT 37.0 08/10/2023 1547   HCT 30.7 (L) 03/03/2020 2017   PLT 187 08/03/2023 1542   PLT 227 01/06/2020 1635   MCV 97.7 07/25/2023 1542   MCV 97 01/06/2020 1635   MCH 31.3 07/28/2023 1542   MCHC 32.1 07/19/2023 1542   RDW 14.6 07/27/2023 1542   RDW 14.6 01/06/2020 1635   LYMPHSABS 1.9 07/24/2023 1542   LYMPHSABS 1.9 11/16/2017 1210   MONOABS 0.6 07/25/2023 1542   EOSABS 0.1 08/10/2023 1542   EOSABS 0.1 11/16/2017 1210   BASOSABS 0.1 07/26/2023 1542   BASOSABS 0.0 11/16/2017 1210    CMP     Component Value Date/Time   NA 142 08/06/2023 1547   NA 140 10/18/2022 1308   K 3.7 08/05/2023 1547   CL 110 07/21/2023 1547   CO2 21 (L) 08/12/2023 1542   GLUCOSE 195 (H) 07/30/2023 1547   BUN 29 (H) 08/06/2023 1547   BUN 40 (H) 10/18/2022 1308   CREATININE 1.80 (H) 07/21/2023 1547   CALCIUM 9.7 07/26/2023 1542   PROT 6.5 07/31/2023 1542   PROT 6.5 01/06/2020 1635   ALBUMIN 3.5 07/18/2023 1542   ALBUMIN 4.2 01/06/2020 1635   AST 35 07/22/2023 1542   ALT 29 07/20/2023 1542   ALKPHOS 121 08/11/2023 1542   BILITOT 0.8 07/20/2023 1542   BILITOT 0.4 01/06/2020 1635   GFRNONAA 26 (L) 08/02/2023 1542   GFRAA 30 (L) 06/15/2020 1242    Lipid Panel     Component Value Date/Time   CHOL 143 03/17/2020 1533   TRIG 182 (H) 03/17/2020 1533   HDL 43 03/17/2020 1533   CHOLHDL 3.3 03/17/2020 1533   VLDL 36 03/17/2020 1533   LDLCALC 64 03/17/2020 1533     Imaging I have reviewed the images obtained:  CT-scan of the brain: 1. Large 4.8 cm acute  intraparenchymal hemorrhage in the inferior left cerebellum. 2. Effacement of the basal cisterns and complete effacement of  the fourth ventricle. No hydrocephalus at this time; however, the patient is at risk for developing hydrocephalus. 3. Suspected intraventricular extension of hemorrhage into the fourth ventricle although anatomy is difficult to determine given mass effect.  CT Angio Head and Neck:  1. Multiple regular areas of contrast within the large left cerebellar hemorrhage, suspicious for active extravasation. 2. No definite aneurysm or arteriovenous malformation identified; however, the presence of acute blood products limits assessment.  Assessment: Shannon Lucas is a 89 y.o. female with PMH significant for Afib, on Xarelto, recent DCCV 8/7 after amio reload, Pacemaker (2019), CHF, CAD, CKD 3a, DM2 BIB EMS as a CODE STROKE due to slight left facial droop, slurred speech and left-sided weakness, headache, hypertensive emergency. CT shows a large cerebellar IPH.   Impression: given the size and location of this hemorrhage, this is a devastating injury. The very poor prognosis was discussed with family (daughter Ginger and granddaughter Mardella Layman) at bedside. They elected for comfort measures only.   Recommendations: - Comfort measures only per family - Neurology will follow along PRN, available for any further family questions   Pt seen by Neuro NP/APP and later by MD. Note/plan to be edited by MD as needed.    Lynnae January, DNP, AGACNP-BC [facility] Please use AMION for contact information & EPIC for messaging.   NEUROHOSPITALIST ADDENDUM Performed a face to face diagnostic evaluation.   I have reviewed the contents of history and physical exam as documented by PA/ARNP/Resident and agree with above documentation.  I have discussed and formulated the above plan as documented. Edits to the note have been made as needed.  Impression/Key exam findings/Plan: unfortunate but devastatingly large L cerebellar bleed with CTA showing active extravasation. Likely etiology is combination of  hypertensive emergency and Xarelto. Family initially requested we pursue all care measures except intubate her. When family arrived, I spoke in detail with her daughter and grand daughter at bedside. By that time, patient was not protecting her airway, was having intermittent extensive posturing and became more somnolent and snoring.  Family opted to make her as comfortable given poor chance of recovery from this devstating hemorrhage and the fact that she was going to end up in either persistent vegetative state or minimally conscious state and require trach and peg and be total care and live in a nursing home with significant decline in quality of life.  ICH Score: 3(vol > 30, infratentorial hemorrhage and Age > 67)  Erick Blinks, MD [facility] [phone]   If 7pm to 7am, please call on call as listed on AMION.

## 2023-08-15 NOTE — ED Triage Notes (Signed)
Pt BIB EMS as code stroke. LSN 1415. Pt had slurred speech, left sided facial droop, left sided weakness. Axox4 on arrival.

## 2023-08-15 NOTE — ED Notes (Addendum)
Time of death- 1800. Verified with Katrina, RN and Baird Lyons neuro NP, DO notified

## 2023-08-15 NOTE — ED Provider Notes (Signed)
[place] EMERGENCY DEPARTMENT AT [facility] Provider Note   CSN: [id number] Arrival date & time: 07/20/2023  1539     History {Add pertinent medical, surgical, social history, OB history to HPI:1} No chief complaint on file.   Shannon Lucas is a 89 y.o. female.  Level 5 caveat secondary to acuity of condition.  Patient is presenting by ambulance for acute onset of headache dizziness altered mental status.  She was a code stroke activation on arrival.  She was immediately evaluated by myself and the stroke team and taken to the CAT scan.  She has a significant cerebellar bleed and her blood pressure is elevated.  She is also on Eliquis last dose was last evening.  Patient unable to communicate, last known CODE STATUS was full per epic.  The history is provided by the EMS personnel.  Altered Mental Status Presenting symptoms: partial responsiveness   Most recent episode:  Today Timing:  Constant Progression:  Worsening Chronicity:  New Associated symptoms: headaches        Home Medications Prior to Admission medications   Medication Sig Start Date End Date Taking? Authorizing Provider  acetaminophen (TYLENOL) 500 MG tablet Take 1,000 mg by mouth every 6 (six) hours as needed for moderate pain or headache.    [provider]  albuterol (VENTOLIN HFA) 108 (90 Base) MCG/ACT inhaler Inhale 2 puffs into the lungs every 6 (six) hours as needed for wheezing or shortness of breath.    [provider]  amiodarone (PACERONE) 200 MG tablet Take 1 tablet by mouth twice a day for 21 days then reduce back to once a day 05/29/23   Eustace Pen, PA-C  benzonatate (TESSALON) 100 MG capsule Take 100 mg by mouth 3 (three) times daily as needed for cough.    [provider]  cholecalciferol (VITAMIN D3) 25 MCG (1000 UNIT) tablet Take 1,000 Units by mouth daily.    [provider]  diphenoxylate-atropine (LOMOTIL) 2.5-0.025 MG tablet Take 1 tablet by mouth 4  (four) times daily as needed for diarrhea or loose stools.    [provider]  doxycycline (VIBRAMYCIN) 100 MG capsule Take 100 mg by mouth 2 (two) times daily. 07/06/23   [provider]  ferrous sulfate 325 (65 FE) MG EC tablet Take 325 mg by mouth daily.    [provider]  hydrALAZINE (APRESOLINE) 50 MG tablet Take 100 mg by mouth 3 (three) times daily.    [provider]  insulin glargine (LANTUS) 100 UNIT/ML injection Inject 55 Units into the skin daily.    [provider]  isosorbide mononitrate (IMDUR) 30 MG 24 hr tablet Take 1 tablet (30 mg total) by mouth daily. 05/03/23   Laurey Morale, MD  levothyroxine (SYNTHROID) 50 MCG tablet Take 50 mcg by mouth daily. 01/29/20   [provider]  metoprolol succinate (TOPROL-XL) 50 MG 24 hr tablet Take 1 tablet (50 mg total) by mouth daily. TAKE 1 TABLET BY MOUTH DAILY IMMEDIATELYFOLLOWING A MEAL 08/22/22   Laurey Morale, MD  OLANZapine (ZYPREXA) 2.5 MG tablet Take 2.5 mg by mouth at bedtime.    [provider]  omeprazole (PRILOSEC) 20 MG capsule Take 20 mg by mouth daily.    [provider]  potassium chloride SA (KLOR-CON M) 20 MEQ tablet TAKE ONE TABLET BY MOUTH DAILY 07/05/23   Laurey Morale, MD  pravastatin (PRAVACHOL) 40 MG tablet Take 1 tablet by mouth at bedtime. 06/15/23   Shirlee Latch,  Eliot Ford, MD  torsemide (DEMADEX) 20 MG tablet Take 1 tablet (20 mg total) by mouth daily. 04/03/23   Laurey Morale, MD  venlafaxine XR (EFFEXOR-XR) 75 MG 24 hr capsule Take 75 mg by mouth daily.    [provider]  XARELTO 15 MG TABS tablet TAKE 1 TABLET BY MOUTH DAILY WITH SUPPER 03/20/23   Laurey Morale, MD      Allergies    Patient has no known allergies.    Review of Systems   Review of Systems  Unable to perform ROS: Mental status change  Neurological:  Positive for headaches.    Physical Exam Updated Vital Signs There were no vitals taken for this  visit. Physical Exam Vitals and nursing note reviewed.  Constitutional:      General: She is not in acute distress.    Appearance: She is well-developed. She is ill-appearing.  HENT:     Head: Normocephalic and atraumatic.  Eyes:     Conjunctiva/sclera: Conjunctivae normal.  Cardiovascular:     Rate and Rhythm: Normal rate and regular rhythm.     Heart sounds: No murmur heard. Pulmonary:     Effort: Pulmonary effort is normal. No respiratory distress.     Breath sounds: Normal breath sounds.  Abdominal:     Palpations: Abdomen is soft.     Tenderness: There is no abdominal tenderness.  Musculoskeletal:        General: No swelling.     Cervical back: Neck supple.  Skin:    General: Skin is warm and dry.     Capillary Refill: Capillary refill takes less than 2 seconds.     Findings: Rash: .mcb.  Neurological:     Comments: Her eyes are open and she is not communicating.  She does not appear to be protecting her airway.  She is able to hold up her arms after they are raised.  There is no gross facial asymmetry.     ED Results / Procedures / Treatments   Labs (all labs ordered are listed, but only abnormal results are displayed) Labs Reviewed  CBC - Abnormal; Notable for the following components:      Result Value   RBC 3.86 (*)    All other components within normal limits  I-STAT CHEM 8, ED - Abnormal; Notable for the following components:   BUN 29 (*)    Creatinine, Ser 1.80 (*)    Glucose, Bld 195 (*)    TCO2 21 (*)    All other components within normal limits  CBG MONITORING, ED - Abnormal; Notable for the following components:   Glucose-Capillary 176 (*)    All other components within normal limits  DIFFERENTIAL  PROTIME-INR  APTT  COMPREHENSIVE METABOLIC PANEL  ETHANOL    EKG None  Radiology CT HEAD CODE STROKE WO CONTRAST  Result Date: 07/25/2023 CLINICAL DATA:  Code stroke.  Neuro deficit, acute, stroke suspected EXAM: CT HEAD WITHOUT CONTRAST  TECHNIQUE: Contiguous axial images were obtained from the base of the skull through the vertex without intravenous contrast. RADIATION DOSE REDUCTION: This exam was performed according to the departmental dose-optimization program which includes automated exposure control, adjustment of the mA and/or kV according to patient size and/or use of iterative reconstruction technique. COMPARISON:  CT head April 1, 24. FINDINGS: Brain: Large (4.8 x 4.5 x 3.5 cm) acute intraparenchymal hemorrhage in the inferior left cerebellum (estimated volume of 38 mL). Effacement of the basal cisterns. Suspected intraventricular extension of hemorrhage into  the fourth ventricle although anatomy is difficult to determine given mass effect. Complete effacement of fourth ventricle. No hydrocephalus at this time. No visible acute large vascular territory infarct. Patchy white matter hypodensities are nonspecific but compatible with chronic microvascular ischemic disease. Vascular: No hyperdense vessel identified. Skull: No acute fracture. Sinuses/Orbits: Clear sinuses.  No acute orbital findings. Other: No mastoid effusions. IMPRESSION: 1. Large 4.8 cm acute intraparenchymal hemorrhage in the inferior left cerebellum. 2. Effacement of the basal cisterns and complete effacement of the fourth ventricle. No hydrocephalus at this time; however, the patient is at risk for developing hydrocephalus. 3. Suspected intraventricular extension of hemorrhage into the fourth ventricle although anatomy is difficult to determine given mass effect. Code stroke imaging results were communicated on 08/09/2023 at 3:51 pm to provider [place] via telephone, who verbally acknowledged these results. Electronically Signed   By: Feliberto Harts M.D.   On: 07/20/2023 15:55    Procedures Procedures  {Document cardiac monitor, telemetry assessment procedure when appropriate:1}  Medications Ordered in ED Medications  sodium chloride flush (NS) 0.9 % injection  3 mL (has no administration in time range)  labetalol (NORMODYNE) injection 10 mg (has no administration in time range)    And  clevidipine (CLEVIPREX) infusion 0.5 mg/mL (has no administration in time range)  iohexol (OMNIPAQUE) 350 MG/ML injection 75 mL (75 mLs Intravenous Contrast Given 07/28/2023 1556)    ED Course/ Medical Decision Making/ A&P   {   Click here for ABCD2, HEART and other calculatorsREFRESH Note before signing :1}                              Medical Decision Making Amount and/or Complexity of Data Reviewed Labs: ordered. Radiology: ordered.   ***  {Document critical care time when appropriate:1} {Document review of labs and clinical decision tools ie heart score, Chads2Vasc2 etc:1}  {Document your independent review of radiology images, and any outside records:1} {Document your discussion with family members, caretakers, and with consultants:1} {Document social determinants of health affecting pt's care:1} {Document your decision making why or why not admission, treatments were needed:1} Final Clinical Impression(s) / ED Diagnoses Final diagnoses:  None    Rx / DC Orders ED Discharge Orders     None

## 2023-08-15 NOTE — Death Summary Note (Signed)
DEATH SUMMARY   Patient Details  Name: Shannon Lucas MRN: [id number] DOB: 09-16-1931 GEX:BMWUX, Shannon Dupont, PA Admission/Discharge Information   Admit Date:  Jul 28, 2023  Date of Death: Date of Death: July 28, 2023  Time of Death: Time of Death: 1818  Length of Stay: 1   Principle Cause of death: Acute spontaneous intraparenchymal hemorrhage associated with anticoagulation use  Hospital Diagnoses: Principal Problem:   Intraparenchymal hematoma of brain (HCC) Active Problems:   CKD (chronic kidney disease) stage 3, GFR 30-59 ml/min (HCC)   Diabetes mellitus type II, controlled (HCC)   Paroxysmal atrial fibrillation (HCC)   Chronic anticoagulation   Tachy-brady syndrome (HCC)   History of present illness:  Shannon Lucas is a 89 y.o. female with medical history significant of paroxysmal atrial fibrillation on Xarelto, HTN, HLD, hypothyroidism, depression/anxiety, chronic systolic congestive heart failure, nonobstructive CAD, CKD stage III, type 2 diabetes mellitus, tachybradycardia syndrome s/p PPM, history of CVA who presented to [facility] ED via EMS from home on 07/28/23 with slurred speech, left-sided facial droop, left-sided weakness.  Last known normal at 1415 today.  Patient had been in her usual state of health without any significant complaints with her family during the day.  Family denies any trauma.  Code stroke was initiated, neurology was consulted and patient underwent CT head and CT angiogram head with findings of a large 4.8 cm acute intraparenchymal hemorrhage in the inferior left cerebellum with effacement of the basal cisterns and complete effacement of the fourth ventricle, suspected intraventricular extension of hemorrhage into the fourth ventricle although anatomy difficult to determine mass effect.  CT angiogram head with multiple irregular areas of contrast within the large left cerebellar hemorrhage suspicious for active extravasation, no definite aneurysm or AVM  identified but presence of acute blood products limits assessment.  Patient continued to decline quickly in which she was alert and oriented x 4 on ED arrival and now she is somnolent and not responding appropriate to commands.  ED physician discussed with the patient's family that she and this state would require intubation and admission to the intensive care unit; after further discussions family decided to transition to comfort measures as the patient would never want to be aggressively managed in that state.  Comfort order set was initiated.  TRH consulted for admission for further management of intraparenchymal hemorrhage on comfort care.   Assessment and Plan:  Acute intraparenchymal hemorrhage left cerebellum with active extravasation in the setting of anticoagulation use Patient presenting to the ED with acute onset left-sided facial droop, dizziness, slurred speech and left-sided weakness.  Last known normal 1415 today.  She has been in her normal state of health without any active complaints.  On arrival to the ED code stroke was initiated neurology was consulted and patient underwent CT head and CT angiogram head with findings of a large 4.8 cm acute intraparenchymal hemorrhage in the inferior left cerebellum with effacement of the basal cisterns and complete effacement of the fourth ventricle, suspected intraventricular extension of hemorrhage into the fourth ventricle although anatomy difficult to determine mass effect.  CT angiogram head with multiple irregular areas of contrast within the large left cerebellar hemorrhage suspicious for active extravasation, no definite aneurysm or AVM identified but presence of acute blood products limits assessment.  Patient continued to decline quickly in which she was alert and oriented x 4 on ED arrival and now she is somnolent and not responding appropriate to commands.  ED physician discussed with the patient's family that she  and this state would require  intubation and admission to the intensive care unit; after further discussions family decided to transition to comfort measures as the patient would never want to be aggressively managed in that state.  Patient passed away on 08-03-23 at 1818.  Family present at bedside.  Chaplain services offered.   Procedures: None  Consultations: Neurology  The results of significant diagnostics from this hospitalization (including imaging, microbiology, ancillary and laboratory) are listed below for reference.   Significant Diagnostic Studies: CT ANGIO HEAD NECK W WO CM (CODE STROKE)  Result Date: 2023/08/03 CLINICAL DATA:  Neuro deficit, acute, stroke suspected EXAM: CT ANGIOGRAPHY HEAD AND NECK WITH AND WITHOUT CONTRAST TECHNIQUE: Multidetector CT imaging of the head and neck was performed using the standard protocol during bolus administration of intravenous contrast. Multiplanar CT image reconstructions and MIPs were obtained to evaluate the vascular anatomy. Carotid stenosis measurements (when applicable) are obtained utilizing NASCET criteria, using the distal internal carotid diameter as the denominator. RADIATION DOSE REDUCTION: This exam was performed according to the departmental dose-optimization program which includes automated exposure control, adjustment of the mA and/or kV according to patient size and/or use of iterative reconstruction technique. CONTRAST:  75mL OMNIPAQUE IOHEXOL 350 MG/ML SOLN COMPARISON:  Same day head CT. FINDINGS: CTA NECK FINDINGS Aortic arch: Great vessel origins are patent. Aortic atherosclerosis. Right carotid system: No evidence of dissection, stenosis (50% or greater), or occlusion. Carotid bifurcation atherosclerosis. Left carotid system: No evidence of dissection, stenosis (50% or greater), or occlusion. Carotid bifurcation atherosclerosis. Vertebral arteries: Codominant. No evidence of dissection, stenosis (50% or greater), or occlusion. Skeleton: No evidence of acute  abnormality limits assessment. Other neck: No evidence of acute abnormality on limited assessment. Upper chest: Mosaic attenuation without consolidation. Review of the MIP images confirms the above findings CTA HEAD FINDINGS Anterior circulation: Bilateral intracranial ICAs, MCAs, and ACAs are patent without proximal high-grade stenosis. No aneurysm identified. Posterior circulation: Multiple regular areas of contrast within the large left cerebellar hemorrhage, suspicious for active extravasation. The intradural vertebral arteries, basilar artery and bilateral posterior cerebral is are patent without proximal high-grade stenosis. No aneurysm identified. Venous sinuses: Limited assessment due to arterial timing. No definite evidence of dural venous sinus thrombosis. Review of the MIP images confirms the above findings IMPRESSION: 1. Multiple regular areas of contrast within the large left cerebellar hemorrhage, suspicious for active extravasation. 2. No definite aneurysm or arteriovenous malformation identified; however, the presence of acute blood products limits assessment. Electronically Signed   By: Feliberto Harts M.D.   On: 03-Aug-2023 16:06   CT HEAD CODE STROKE WO CONTRAST  Result Date: 08-03-2023 CLINICAL DATA:  Code stroke.  Neuro deficit, acute, stroke suspected EXAM: CT HEAD WITHOUT CONTRAST TECHNIQUE: Contiguous axial images were obtained from the base of the skull through the vertex without intravenous contrast. RADIATION DOSE REDUCTION: This exam was performed according to the departmental dose-optimization program which includes automated exposure control, adjustment of the mA and/or kV according to patient size and/or use of iterative reconstruction technique. COMPARISON:  CT head April 1, 24. FINDINGS: Brain: Large (4.8 x 4.5 x 3.5 cm) acute intraparenchymal hemorrhage in the inferior left cerebellum (estimated volume of 38 mL). Effacement of the basal cisterns. Suspected intraventricular  extension of hemorrhage into the fourth ventricle although anatomy is difficult to determine given mass effect. Complete effacement of fourth ventricle. No hydrocephalus at this time. No visible acute large vascular territory infarct. Patchy white matter hypodensities are nonspecific but compatible with chronic  microvascular ischemic disease. Vascular: No hyperdense vessel identified. Skull: No acute fracture. Sinuses/Orbits: Clear sinuses.  No acute orbital findings. Other: No mastoid effusions. IMPRESSION: 1. Large 4.8 cm acute intraparenchymal hemorrhage in the inferior left cerebellum. 2. Effacement of the basal cisterns and complete effacement of the fourth ventricle. No hydrocephalus at this time; however, the patient is at risk for developing hydrocephalus. 3. Suspected intraventricular extension of hemorrhage into the fourth ventricle although anatomy is difficult to determine given mass effect. Code stroke imaging results were communicated on 07/24/2023 at 3:51 pm to provider [place] via telephone, who verbally acknowledged these results. Electronically Signed   By: Feliberto Harts M.D.   On: 07/26/2023 15:55   EP STUDY  Result Date: 06/21/2023 See surgical note for result.   Microbiology: No results found for this or any previous visit (from the past 240 hour(s)).  Time spent: 36 minutes  Signed: Alvira Philips [place], DO 07/21/2023

## 2023-08-15 NOTE — ED Notes (Signed)
Family refusing morphine gtt at this time

## 2023-08-15 NOTE — H&P (Signed)
History and Physical    Patient: Shannon Lucas XBM:[id number] DOB: 24-Oct-1931 DOA: 07/22/2023 DOS: the patient was seen and examined on 08/06/2023 PCP: Shellia Cleverly, PA  Patient coming from: Home  Chief Complaint: Slurred speech, left-sided facial droop, left-sided weakness, dizziness Chief Complaint  Patient presents with   Code Stroke   HPI: Shannon Lucas is a 89 y.o. female with medical history significant of paroxysmal atrial fibrillation on Xarelto, HTN, HLD, hypothyroidism, depression/anxiety, chronic systolic congestive heart failure, nonobstructive CAD, CKD stage III, type 2 diabetes mellitus, tachybradycardia syndrome s/p PPM, history of CVA who presented to [facility] ED via EMS from home on 08/02/2023 with slurred speech, left-sided facial droop, left-sided weakness.  Last known normal at 1415 today.  Patient had been in her usual state of health without any significant complaints with her family during the day.  Family denies any trauma.  Code stroke was initiated, neurology was consulted and patient underwent CT head and CT angiogram head with findings of a large 4.8 cm acute intraparenchymal hemorrhage in the inferior left cerebellum with effacement of the basal cisterns and complete effacement of the fourth ventricle, suspected intraventricular extension of hemorrhage into the fourth ventricle although anatomy difficult to determine mass effect.  CT angiogram head with multiple irregular areas of contrast within the large left cerebellar hemorrhage suspicious for active extravasation, no definite aneurysm or AVM identified but presence of acute blood products limits assessment.  Patient continued to decline quickly in which she was alert and oriented x 4 on ED arrival and now she is somnolent and not responding appropriate to commands.  ED physician discussed with the patient's family that she and this state would require intubation and admission to the intensive care unit; after further  discussions family decided to transition to comfort measures as the patient would never want to be aggressively managed in that state.  Comfort order set was initiated.  TRH consulted for admission for further management of intraparenchymal hemorrhage on comfort care.   Review of Systems: unable to review all systems due to the inability of the patient to answer questions. Past Medical History:  Diagnosis Date   A-fib [facility])    Arthritis    "a little bit qwhere" (02/28/2017)   CHF (congestive heart failure) (HCC)    CKD (chronic kidney disease), stage III (HCC)    [name] 02/28/2017   Depression    GERD (gastroesophageal reflux disease)    High cholesterol    Hypertension    Melanoma of back (HCC)    Myocardial infarction [facility])    "one dr said I'd had 1" (02/28/2017)   Stroke (HCC) ~ 2010; ~2012   "affected the right side but I fully recovered; just a light one" (02/28/2017)   Type II diabetes mellitus (HCC)    Past Surgical History:  Procedure Laterality Date   APPENDECTOMY     CARDIOVERSION N/A 06/21/2023   Procedure: CARDIOVERSION;  Surgeon: Thomasene Ripple, DO;  Location: [facility] INVASIVE CV LAB;  Service: Cardiovascular;  Laterality: N/A;   FALSE ANEURYSM REPAIR Right 03/08/2017   Procedure: REPAIR RIGHT RADIAL FALSE ANEURYSM;  Surgeon: Larina Earthly, MD;  Location: [facility] OR;  Service: Vascular;  Laterality: Right;   KNEE SURGERY Left 1970s   "I have 1/3 of my knee left in there"   LAPAROSCOPIC CHOLECYSTECTOMY     MELANOMA EXCISION     "back"   PACEMAKER IMPLANT N/A 11/20/2017   Procedure: PACEMAKER IMPLANT;  Surgeon: Regan Lemming, MD;  Location: [facility] INVASIVE CV LAB;  Service: Cardiovascular;  Laterality: N/A;   RIGHT/LEFT HEART CATH AND CORONARY ANGIOGRAPHY N/A 03/06/2017   Procedure: Right/Left Heart Cath and Coronary Angiography;  Surgeon: Laurey Morale, MD;  Location: [facility] INVASIVE CV LAB;  Service: Cardiovascular;  Laterality: N/A;   TUBAL LIGATION     Social History:  reports that  she has quit smoking. She has never used smokeless tobacco. She reports that she does not drink alcohol and does not use drugs.  No Known Allergies  Family History  Problem Relation Age of Onset   Diabetes Mother    Stroke Father    Parkinson's disease Brother     Prior to Admission medications   Medication Sig Start Date End Date Taking? Authorizing Provider  acetaminophen (TYLENOL) 500 MG tablet Take 1,000 mg by mouth every 6 (six) hours as needed for moderate pain or headache.    [provider]  albuterol (VENTOLIN HFA) 108 (90 Base) MCG/ACT inhaler Inhale 2 puffs into the lungs every 6 (six) hours as needed for wheezing or shortness of breath.    [provider]  amiodarone (PACERONE) 200 MG tablet Take 1 tablet by mouth twice a day for 21 days then reduce back to once a day 05/29/23   Eustace Pen, PA-C  benzonatate (TESSALON) 100 MG capsule Take 100 mg by mouth 3 (three) times daily as needed for cough.    [provider]  cholecalciferol (VITAMIN D3) 25 MCG (1000 UNIT) tablet Take 1,000 Units by mouth daily.    [provider]  diphenoxylate-atropine (LOMOTIL) 2.5-0.025 MG tablet Take 1 tablet by mouth 4 (four) times daily as needed for diarrhea or loose stools.    [provider]  doxycycline (VIBRAMYCIN) 100 MG capsule Take 100 mg by mouth 2 (two) times daily. 07/06/23   [provider]  ferrous sulfate 325 (65 FE) MG EC tablet Take 325 mg by mouth daily.    [provider]  hydrALAZINE (APRESOLINE) 50 MG tablet Take 100 mg by mouth 3 (three) times daily.    [provider]  insulin glargine (LANTUS) 100 UNIT/ML injection Inject 55 Units into the skin daily.    [provider]  isosorbide mononitrate (IMDUR) 30 MG 24 hr tablet Take 1 tablet (30 mg total) by mouth daily. 05/03/23   Laurey Morale, MD  levothyroxine (SYNTHROID) 50 MCG tablet Take 50 mcg by mouth daily. 01/29/20   [provider]  metoprolol succinate (TOPROL-XL) 50 MG 24 hr tablet Take 1 tablet (50 mg total) by mouth daily. TAKE 1 TABLET BY MOUTH DAILY IMMEDIATELYFOLLOWING A MEAL 08/22/22   Laurey Morale, MD  OLANZapine (ZYPREXA) 2.5 MG tablet Take 2.5 mg by mouth at bedtime.    [provider]  omeprazole (PRILOSEC) 20 MG capsule Take 20 mg by mouth daily.    [provider]  potassium chloride SA (KLOR-CON M) 20 MEQ tablet TAKE ONE TABLET BY MOUTH DAILY 07/05/23   Laurey Morale, MD  pravastatin (PRAVACHOL) 40 MG tablet Take 1 tablet by mouth at bedtime. 06/15/23   Laurey Morale, MD  torsemide (DEMADEX) 20 MG tablet Take 1 tablet (20 mg total) by mouth daily. 04/03/23   Laurey Morale, MD  venlafaxine XR (EFFEXOR-XR) 75 MG 24 hr capsule Take 75 mg by mouth daily.    [provider]  XARELTO 15 MG TABS tablet TAKE 1 TABLET BY MOUTH DAILY WITH SUPPER 03/20/23   Marca Ancona  S, MD    Physical Exam: Vitals:   08/06/2023 1615 07/22/2023 1620 07/21/2023 1621 08/03/2023 1621  BP: (!) 145/48 (!) 125/54    Pulse: 60 (!) 59 (!) 59   Resp: 17 19 16    Temp:    (!) 97.5 F (36.4 C)  TempSrc:    Axillary  SpO2: 99% 99% 100%    Physical Exam GEN: 89 yo in NAD, unresponsive to verbal stimuli, critically ill HEENT: NCAT without obvious abnormality PULM: CTAB, shallow respirations, on supplemental oxygen CV: Irregularly irregular rhythm, normal rate GI: abd soft, nontender, nondistended, + BS MSK: no peripheral edema NEURO: Obtunded   Assessment and Plan:  Acute intraparenchymal hemorrhage left cerebellum with active extravasation in the setting of anticoagulation use Patient presenting to the ED with acute onset left-sided facial droop, dizziness, slurred speech and left-sided weakness.  Last known normal 1415 today.  She has been in her normal state of health without any active complaints.  On arrival to the ED code stroke was initiated neurology was consulted and patient underwent CT head and  CT angiogram head with findings of a large 4.8 cm acute intraparenchymal hemorrhage in the inferior left cerebellum with effacement of the basal cisterns and complete effacement of the fourth ventricle, suspected intraventricular extension of hemorrhage into the fourth ventricle although anatomy difficult to determine mass effect.  CT angiogram head with multiple irregular areas of contrast within the large left cerebellar hemorrhage suspicious for active extravasation, no definite aneurysm or AVM identified but presence of acute blood products limits assessment.  Patient continued to decline quickly in which she was alert and oriented x 4 on ED arrival and now she is somnolent and not responding appropriate to commands.  ED physician discussed with the patient's family that she and this state would require intubation and admission to the intensive care unit; after further discussions family decided to transition to comfort measures as the patient would never want to be aggressively managed in that state. -- Admit inpatient, palliative care floor -- Morphine drip -- Ativan/Haldol as needed for anxiety/agitation -- Robamol as needed excessive secretions -- Overall poor prognosis, expect in-hospital death hours to days     Advance Care Planning:   Code Status: Do not attempt resuscitation (DNR) - Comfort care   Consults: Neurology  Family Communication: Daughter and granddaughter present at bedside  Severity of Illness: The appropriate patient status for this patient is INPATIENT. Inpatient status is judged to be reasonable and necessary in order to provide the required intensity of service to ensure the patient's safety. The patient's presenting symptoms, physical exam findings, and initial radiographic and laboratory data in the context of their chronic comorbidities is felt to place them at high risk for further clinical deterioration. Furthermore, it is not anticipated that the patient will be  medically stable for discharge from the hospital within 2 midnights of admission.   * I certify that at the point of admission it is my clinical judgment that the patient will require inpatient hospital care spanning beyond 2 midnights from the point of admission due to high intensity of service, high risk for further deterioration and high frequency of surveillance required.*  Author: Alvira Philips [place], DO 08/02/2023 5:32 PM  For on call review [email or web address].

## 2023-08-15 NOTE — ED Notes (Signed)
Pt declining quickly, neuro at bedside

## 2023-08-15 NOTE — Progress Notes (Signed)
   07/28/2023 1800  Spiritual Encounters  Type of Visit Initial  Care provided to: [facility] partners present during encounter Nurse  Referral source Code page  Reason for visit Patient death  OnCall Visit Yes   Ch responded to request for emotional and spiritual support. Pt's family was present at bedside. Ch accompanied family through the grief process and provided hospitality. Ch remains available, if needed.   Chaplain Val Richrd Kuzniar, M.Div.

## 2023-08-15 NOTE — ED Notes (Signed)
RN attempted to starrt morphine gtt, family at bedside asked if they could hold off on morphine gtt until rest of family gets there.

## 2023-08-15 NOTE — ED Notes (Signed)
ED TO INPATIENT HANDOFF REPORT  ED Nurse Name and Phone #: Tori [phone]  S Name/Age/Gender [facility] 89 y.o. female Room/Bed: 033C/033C  Code Status   Code Status: Do not attempt resuscitation (DNR) - Comfort care  Home/SNF/Other Home  Not oriented nor talking  Is this baseline?   Triage Complete: Triage complete  Chief Complaint Intraparenchymal hematoma of brain [facility]) [S06.33AA]  Triage Note Pt BIB EMS as code stroke. LSN 1415. Pt had slurred speech, left sided facial droop, left sided weakness. Axox4 on arrival.    Allergies No Known Allergies  Level of Care/Admitting Diagnosis ED Disposition     ED Disposition  Admit   Condition  --   Comment  [facility] Area: [facility] [[id number]]  Level of Care: Palliative Care [15]  May admit patient to [facility] or [facility] if equivalent level of care is available:: No  Covid Evaluation: Asymptomatic - no recent exposure (last 10 days) testing not required  Diagnosis: Intraparenchymal hematoma of brain [facility]) [[id number]]  Admitting Physician: [place], ERIC J [[id number]]  Attending Physician: [place], ERIC J [[id number]]  Certification:: I certify this patient will need inpatient services for at least 2 midnights  Expected Medical Readiness: 07/18/2023          B Medical/Surgery History Past Medical History:  Diagnosis Date   A-fib (HCC)    Arthritis    "a little bit qwhere" (02/28/2017)   CHF (congestive heart failure) (HCC)    CKD (chronic kidney disease), stage III (HCC)    [name] 02/28/2017   Depression    GERD (gastroesophageal reflux disease)    High cholesterol    Hypertension    Melanoma of back (HCC)    Myocardial infarction [facility])    "one dr said I'd had 1" (02/28/2017)   Stroke (HCC) ~ 2010; ~2012   "affected the right side but I fully recovered; just a light one" (02/28/2017)   Type II diabetes mellitus (HCC)    Past Surgical History:  Procedure Laterality Date   APPENDECTOMY      CARDIOVERSION N/A 06/21/2023   Procedure: CARDIOVERSION;  Surgeon: Thomasene Ripple, DO;  Location: [facility];  Service: Cardiovascular;  Laterality: N/A;   FALSE ANEURYSM REPAIR Right 03/08/2017   Procedure: REPAIR RIGHT RADIAL FALSE ANEURYSM;  Surgeon: Larina Earthly, MD;  [facility];  Service: Vascular;  Laterality: Right;   KNEE SURGERY Left 1970s   "I have 1/3 of my knee left in there"   LAPAROSCOPIC CHOLECYSTECTOMY     MELANOMA EXCISION     "back"   PACEMAKER IMPLANT N/A 11/20/2017   Procedure: PACEMAKER IMPLANT;  Surgeon: Regan Lemming, MD;  Location: [facility];  Service: Cardiovascular;  Laterality: N/A;   RIGHT/LEFT HEART CATH AND CORONARY ANGIOGRAPHY N/A 03/06/2017   Procedure: Right/Left Heart Cath and Coronary Angiography;  Surgeon: Laurey Morale, MD;  Location: [facility];  Service: Cardiovascular;  Laterality: N/A;   TUBAL LIGATION       A IV Location/Drains/Wounds Patient Lines/Drains/Airways Status     Active Line/Drains/Airways     Name Placement date Placement time Site Days   Peripheral IV 07/28/2023 18 G Anterior;Proximal;Right Forearm 08/11/2023  1622  Forearm  less than 1            Intake/Output Last 24 hours No intake or output data in the 24 hours ending 08/14/2023 1733  Labs/Imaging Results for orders placed or performed during the [facility] encounter of 08/06/2023 (  from the past 48 hour(s))  CBG monitoring, ED     Status: Abnormal   Collection Time: 07/28/2023  3:41 PM  Result Value Ref Range   Glucose-Capillary 176 (H) 70 - 99 mg/dL    Comment: Glucose reference range applies only to samples taken after fasting for at least 8 hours.  Protime-INR     Status: Abnormal   Collection Time: 07/24/2023  3:42 PM  Result Value Ref Range   Prothrombin Time 22.1 (H) 11.4 - 15.2 seconds   INR 1.9 (H) 0.8 - 1.2    Comment: (NOTE) INR goal varies based on device and disease states. Performed at [facility], [address]  [place], [place] [address]   APTT     Status: None   Collection Time: 07/21/2023  3:42 PM  Result Value Ref Range   aPTT 30 24 - 36 seconds    Comment: Performed at [facility], [address], [place], [place] [address]  CBC     Status: Abnormal   Collection Time: 07/26/2023  3:42 PM  Result Value Ref Range   WBC 9.2 4.0 - 10.5 K/uL   RBC 3.86 (L) 3.87 - 5.11 MIL/uL   Hemoglobin 12.1 12.0 - 15.0 g/dL   HCT 09.8 11.9 - 14.7 %   MCV 97.7 80.0 - 100.0 fL   MCH 31.3 26.0 - 34.0 pg   MCHC 32.1 30.0 - 36.0 g/dL   RDW 82.9 56.2 - 13.0 %   Platelets 187 150 - 400 K/uL   nRBC 0.0 0.0 - 0.2 %    Comment: Performed at [facility], [address], [place], [place] [address]  Differential     Status: None   Collection Time: 08/14/2023  3:42 PM  Result Value Ref Range   Neutrophils Relative % 68 %   Neutro Abs 6.4 1.7 - 7.7 K/uL   Lymphocytes Relative 21 %   Lymphs Abs 1.9 0.7 - 4.0 K/uL   Monocytes Relative 7 %   Monocytes Absolute 0.6 0.1 - 1.0 K/uL   Eosinophils Relative 2 %   Eosinophils Absolute 0.1 0.0 - 0.5 K/uL   Basophils Relative 1 %   Basophils Absolute 0.1 0.0 - 0.1 K/uL   Immature Granulocytes 1 %   Abs Immature Granulocytes 0.05 0.00 - 0.07 K/uL    Comment: Performed at [facility], [address] [place], [place] [address]  Comprehensive metabolic panel     Status: Abnormal   Collection Time: 08/08/2023  3:42 PM  Result Value Ref Range   Sodium 141 135 - 145 mmol/L   Potassium 3.6 3.5 - 5.1 mmol/L   Chloride 107 98 - 111 mmol/L   CO2 21 (L) 22 - 32 mmol/L   Glucose, Bld 198 (H) 70 - 99 mg/dL    Comment: Glucose reference range applies only to samples taken after fasting for at least 8 hours.   BUN 26 (H) 8 - 23 mg/dL   Creatinine, Ser 9.52 (H) 0.44 - 1.00 mg/dL   Calcium 9.7 8.9 - 84.1 mg/dL   Total Protein 6.5 6.5 - 8.1 g/dL   Albumin 3.5 3.5 - 5.0 g/dL   AST 35 15 - 41 U/L   ALT 29 0 - 44 U/L   Alkaline Phosphatase 121 38 - 126 U/L   Total Bilirubin  0.8 0.3 - 1.2 mg/dL   GFR, Estimated 26 (L) >60 mL/min    Comment: (NOTE) Calculated using the CKD-EPI  Creatinine Equation (2021)    Anion gap 13 5 - 15    Comment: Performed at [facility], [address] [place], [place] [address]  Ethanol     Status: None   Collection Time: 07/18/2023  3:42 PM  Result Value Ref Range   Alcohol, Ethyl (B) <10 <10 mg/dL    Comment: (NOTE) Lowest detectable limit for serum alcohol is 10 mg/dL.  For medical purposes only. Performed at [facility], [address] [place], [place] [address]   I-stat chem 8, ED     Status: Abnormal   Collection Time: 08/12/2023  3:47 PM  Result Value Ref Range   Sodium 142 135 - 145 mmol/L   Potassium 3.7 3.5 - 5.1 mmol/L   Chloride 110 98 - 111 mmol/L   BUN 29 (H) 8 - 23 mg/dL   Creatinine, Ser 0.98 (H) 0.44 - 1.00 mg/dL   Glucose, Bld 119 (H) 70 - 99 mg/dL    Comment: Glucose reference range applies only to samples taken after fasting for at least 8 hours.   Calcium, Ion 1.22 1.15 - 1.40 mmol/L   TCO2 21 (L) 22 - 32 mmol/L   Hemoglobin 12.6 12.0 - 15.0 g/dL   HCT 14.7 82.9 - 56.2 %   CT ANGIO HEAD NECK W WO CM (CODE STROKE)  Result Date: 07/18/2023 CLINICAL DATA:  Neuro deficit, acute, stroke suspected EXAM: CT ANGIOGRAPHY HEAD AND NECK WITH AND WITHOUT CONTRAST TECHNIQUE: Multidetector CT imaging of the head and neck was performed using the standard protocol during bolus administration of intravenous contrast. Multiplanar CT image reconstructions and MIPs were obtained to evaluate the vascular anatomy. Carotid stenosis measurements (when applicable) are obtained utilizing NASCET criteria, using the distal internal carotid diameter as the denominator. RADIATION DOSE REDUCTION: This exam was performed according to the departmental dose-optimization program which includes automated exposure control, adjustment of the mA and/or kV according to patient size and/or use of iterative reconstruction technique.  CONTRAST:  75mL OMNIPAQUE IOHEXOL 350 MG/ML SOLN COMPARISON:  Same day head CT. FINDINGS: CTA NECK FINDINGS Aortic arch: Great vessel origins are patent. Aortic atherosclerosis. Right carotid system: No evidence of dissection, stenosis (50% or greater), or occlusion. Carotid bifurcation atherosclerosis. Left carotid system: No evidence of dissection, stenosis (50% or greater), or occlusion. Carotid bifurcation atherosclerosis. Vertebral arteries: Codominant. No evidence of dissection, stenosis (50% or greater), or occlusion. Skeleton: No evidence of acute abnormality limits assessment. Other neck: No evidence of acute abnormality on limited assessment. Upper chest: Mosaic attenuation without consolidation. Review of the MIP images confirms the above findings CTA HEAD FINDINGS Anterior circulation: Bilateral intracranial ICAs, MCAs, and ACAs are patent without proximal high-grade stenosis. No aneurysm identified. Posterior circulation: Multiple regular areas of contrast within the large left cerebellar hemorrhage, suspicious for active extravasation. The intradural vertebral arteries, basilar artery and bilateral posterior cerebral is are patent without proximal high-grade stenosis. No aneurysm identified. Venous sinuses: Limited assessment due to arterial timing. No definite evidence of dural venous sinus thrombosis. Review of the MIP images confirms the above findings IMPRESSION: 1. Multiple regular areas of contrast within the large left cerebellar hemorrhage, suspicious for active extravasation. 2. No definite aneurysm or arteriovenous malformation identified; however, the presence of acute blood products limits assessment. Electronically Signed   By: Feliberto Harts M.D.   On: 07/27/2023 16:06   CT HEAD CODE STROKE WO CONTRAST  Result Date: 08/10/2023 CLINICAL DATA:  Code stroke.  Neuro deficit, acute, stroke suspected  EXAM: CT HEAD WITHOUT CONTRAST TECHNIQUE: Contiguous axial images were obtained from the  base of the skull through the vertex without intravenous contrast. RADIATION DOSE REDUCTION: This exam was performed according to the departmental dose-optimization program which includes automated exposure control, adjustment of the mA and/or kV according to patient size and/or use of iterative reconstruction technique. COMPARISON:  CT head April 1, 24. FINDINGS: Brain: Large (4.8 x 4.5 x 3.5 cm) acute intraparenchymal hemorrhage in the inferior left cerebellum (estimated volume of 38 mL). Effacement of the basal cisterns. Suspected intraventricular extension of hemorrhage into the fourth ventricle although anatomy is difficult to determine given mass effect. Complete effacement of fourth ventricle. No hydrocephalus at this time. No visible acute large vascular territory infarct. Patchy white matter hypodensities are nonspecific but compatible with chronic microvascular ischemic disease. Vascular: No hyperdense vessel identified. Skull: No acute fracture. Sinuses/Orbits: Clear sinuses.  No acute orbital findings. Other: No mastoid effusions. IMPRESSION: 1. Large 4.8 cm acute intraparenchymal hemorrhage in the inferior left cerebellum. 2. Effacement of the basal cisterns and complete effacement of the fourth ventricle. No hydrocephalus at this time; however, the patient is at risk for developing hydrocephalus. 3. Suspected intraventricular extension of hemorrhage into the fourth ventricle although anatomy is difficult to determine given mass effect. Code stroke imaging results were communicated on 08/10/2023 at 3:51 pm to provider [place] via telephone, who verbally acknowledged these results. Electronically Signed   By: Feliberto Harts M.D.   On: 08/04/2023 15:55    Pending Labs Unresulted Labs (From admission, onward)    None       Vitals/Pain Today's Vitals   07/28/2023 1615 07/17/2023 1620 08/08/2023 1621 07/17/2023 1621  BP: (!) 145/48 (!) 125/54    Pulse: 60 (!) 59 (!) 59   Resp: 17 19 16    Temp:     (!) 97.5 F (36.4 C)  TempSrc:    Axillary  SpO2: 99% 99% 100%     Isolation Precautions No active isolations  Medications Medications  sodium chloride flush (NS) 0.9 % injection 3 mL (has no administration in time range)  0.9 %  sodium chloride infusion (has no administration in time range)  acetaminophen (TYLENOL) tablet 650 mg (has no administration in time range)    Or  acetaminophen (TYLENOL) suppository 650 mg (has no administration in time range)  glycopyrrolate (ROBINUL) tablet 1 mg (has no administration in time range)    Or  glycopyrrolate (ROBINUL) injection 0.2 mg (has no administration in time range)    Or  glycopyrrolate (ROBINUL) injection 0.2 mg (has no administration in time range)  polyvinyl alcohol (LIQUIFILM TEARS) 1.4 % ophthalmic solution 1 drop (has no administration in time range)  morphine bolus via infusion 5 mg (has no administration in time range)  morphine 100mg  in NS (1mg /mL) infusion - premix (has no administration in time range)  haloperidol (HALDOL) tablet 0.5 mg (has no administration in time range)    Or  haloperidol (HALDOL) 2 MG/ML solution 0.5 mg (has no administration in time range)    Or  haloperidol lactate (HALDOL) injection 0.5 mg (has no administration in time range)  ondansetron (ZOFRAN-ODT) disintegrating tablet 4 mg (has no administration in time range)    Or  ondansetron (ZOFRAN) injection 4 mg (has no administration in time range)  antiseptic oral rinse (BIOTENE) solution 15 mL (has no administration in time range)  LORazepam (ATIVAN) tablet 1 mg (has no administration in time range)    Or  LORazepam (ATIVAN)  2 MG/ML concentrated solution 1 mg (has no administration in time range)    Or  LORazepam (ATIVAN) injection 1 mg (has no administration in time range)  labetalol (NORMODYNE) injection 10 mg (10 mg Intravenous Given 08/12/2023 1600)  iohexol (OMNIPAQUE) 350 MG/ML injection 75 mL (75 mLs Intravenous Contrast Given 08/02/2023  1556)    Mobility non-ambulatory     Focused Assessments Neuro Assessment Handoff:  Swallow screen pass? No    NIH Stroke Scale  Interval: Initial Level of Consciousness (1a.)   : Responds only with reflex motor or autonomic effects or totally unresponsive, flaccid, and areflexic LOC Questions (1b. )   : Answers neither question correctly LOC Commands (1c. )   : Performs neither task correctly Best Gaze (2. )  : Forced deviation Visual (3. )  : Bilateral hemianopia (blind including cortical blindness) Facial Palsy (4. )    : Complete paralysis of one or both sides Motor Arm, Left (5a. )   : No movement Motor Arm, Right (5b. ) : No movement Motor Leg, Left (6a. )  : No movement Motor Leg, Right (6b. ) : No movement Limb Ataxia (7. ): Present in two limbs Sensory (8. )  : Severe to total sensory loss, patient is not aware of being touched in the face, arm, and leg Best Language (9. )  : Mute, global aphasia Dysarthria (10. ): Severe dysarthria, patient's speech is so slurred as to be unintelligible in the absence of or out of proportion to any dysphasia, or is mute/anarthric Extinction/Inattention (11.)   : Profound hemi-inattention or extinction to more than one modality Complete NIHSS TOTAL: 42     Neuro Assessment:   Neuro Checks:   Initial (08/10/2023 1623)  Has TPA been given? No If patient is a Neuro Trauma and patient is going to OR before floor call report to 4N Charge nurse: [phone] or [phone]   R Recommendations: See Admitting Provider Note  Report given to:   Additional Notes: family at bedside

## 2023-08-15 DEATH — deceased

## 2023-09-18 ENCOUNTER — Encounter (HOSPITAL_COMMUNITY): Payer: Medicare Other

## 2023-10-23 ENCOUNTER — Ambulatory Visit: Payer: Medicare Other

## 2024-01-22 ENCOUNTER — Ambulatory Visit: Payer: Medicare Other

## 2024-04-22 ENCOUNTER — Ambulatory Visit: Payer: Medicare Other

## 2024-07-22 ENCOUNTER — Ambulatory Visit: Payer: Medicare Other

## 2024-10-21 ENCOUNTER — Ambulatory Visit: Payer: Medicare Other

## 2025-01-20 ENCOUNTER — Ambulatory Visit: Payer: Medicare Other
